# Patient Record
Sex: Male | Born: 1940 | Race: Black or African American | Hispanic: No | Smoking: Never smoker
Health system: Southern US, Community
[De-identification: ages and names within clinical notes are randomized; demographics above are authoritative.]

## PROBLEM LIST (undated history)

## (undated) DIAGNOSIS — B009 Herpesviral infection, unspecified: Secondary | ICD-10-CM

## (undated) DIAGNOSIS — E785 Hyperlipidemia, unspecified: Secondary | ICD-10-CM

## (undated) DIAGNOSIS — Z8719 Personal history of other diseases of the digestive system: Secondary | ICD-10-CM

## (undated) DIAGNOSIS — G8929 Other chronic pain: Secondary | ICD-10-CM

## (undated) DIAGNOSIS — Z860100 Personal history of colon polyps, unspecified: Secondary | ICD-10-CM

## (undated) DIAGNOSIS — M549 Dorsalgia, unspecified: Secondary | ICD-10-CM

## (undated) DIAGNOSIS — J189 Pneumonia, unspecified organism: Secondary | ICD-10-CM

## (undated) DIAGNOSIS — K219 Gastro-esophageal reflux disease without esophagitis: Secondary | ICD-10-CM

## (undated) DIAGNOSIS — I10 Essential (primary) hypertension: Secondary | ICD-10-CM

## (undated) DIAGNOSIS — Z8711 Personal history of peptic ulcer disease: Secondary | ICD-10-CM

## (undated) DIAGNOSIS — R350 Frequency of micturition: Secondary | ICD-10-CM

## (undated) DIAGNOSIS — E119 Type 2 diabetes mellitus without complications: Secondary | ICD-10-CM

## (undated) DIAGNOSIS — R3915 Urgency of urination: Secondary | ICD-10-CM

## (undated) DIAGNOSIS — M1990 Unspecified osteoarthritis, unspecified site: Secondary | ICD-10-CM

## (undated) DIAGNOSIS — G4730 Sleep apnea, unspecified: Secondary | ICD-10-CM

## (undated) HISTORY — PX: OTHER SURGICAL HISTORY: SHX169

## (undated) HISTORY — PX: PENILE PROSTHESIS IMPLANT: SHX240

## (undated) HISTORY — PX: ESOPHAGOGASTRODUODENOSCOPY (EGD) WITH ESOPHAGEAL DILATION: SHX5812

## (undated) HISTORY — PX: WRIST SURGERY: SHX841

## (undated) HISTORY — PX: ROTATOR CUFF REPAIR: SHX139

## (undated) HISTORY — PX: CERVICAL SPINE SURGERY: SHX589

## (undated) HISTORY — PX: NECK SURGERY: SHX720

## (undated) HISTORY — DX: Hyperlipidemia, unspecified: E78.5

## (undated) HISTORY — PX: COLONOSCOPY: SHX174

## (undated) HISTORY — DX: Essential (primary) hypertension: I10

## (undated) HISTORY — PX: SHOULDER SURGERY: SHX246

## (undated) HISTORY — PX: BACK SURGERY: SHX140

## (undated) HISTORY — PX: HAND SURGERY: SHX662

## (undated) HISTORY — DX: Herpesviral infection, unspecified: B00.9

---

## 1960-07-09 HISTORY — PX: BACK SURGERY: SHX140

## 2000-10-21 ENCOUNTER — Ambulatory Visit (HOSPITAL_COMMUNITY): Admission: RE | Admit: 2000-10-21 | Discharge: 2000-10-21 | Payer: Self-pay | Admitting: Internal Medicine

## 2000-10-21 ENCOUNTER — Encounter (INDEPENDENT_AMBULATORY_CARE_PROVIDER_SITE_OTHER): Payer: Self-pay | Admitting: Internal Medicine

## 2000-11-17 ENCOUNTER — Emergency Department (HOSPITAL_COMMUNITY): Admission: EM | Admit: 2000-11-17 | Discharge: 2000-11-18 | Payer: Self-pay | Admitting: *Deleted

## 2000-11-18 ENCOUNTER — Encounter: Payer: Self-pay | Admitting: *Deleted

## 2000-11-22 ENCOUNTER — Emergency Department (HOSPITAL_COMMUNITY): Admission: EM | Admit: 2000-11-22 | Discharge: 2000-11-22 | Payer: Self-pay | Admitting: *Deleted

## 2000-11-22 ENCOUNTER — Encounter: Payer: Self-pay | Admitting: *Deleted

## 2000-12-21 ENCOUNTER — Emergency Department (HOSPITAL_COMMUNITY): Admission: EM | Admit: 2000-12-21 | Discharge: 2000-12-21 | Payer: Self-pay | Admitting: *Deleted

## 2001-03-15 ENCOUNTER — Ambulatory Visit (HOSPITAL_COMMUNITY): Admission: RE | Admit: 2001-03-15 | Discharge: 2001-03-15 | Payer: Self-pay | Admitting: Family Medicine

## 2001-03-15 ENCOUNTER — Encounter: Payer: Self-pay | Admitting: Family Medicine

## 2001-05-21 ENCOUNTER — Emergency Department (HOSPITAL_COMMUNITY): Admission: EM | Admit: 2001-05-21 | Discharge: 2001-05-21 | Payer: Self-pay | Admitting: Emergency Medicine

## 2001-05-21 ENCOUNTER — Encounter: Payer: Self-pay | Admitting: Emergency Medicine

## 2001-10-14 ENCOUNTER — Observation Stay (HOSPITAL_COMMUNITY): Admission: RE | Admit: 2001-10-14 | Discharge: 2001-10-15 | Payer: Self-pay | Admitting: Urology

## 2001-12-05 ENCOUNTER — Emergency Department (HOSPITAL_COMMUNITY): Admission: EM | Admit: 2001-12-05 | Discharge: 2001-12-05 | Payer: Self-pay | Admitting: Emergency Medicine

## 2002-06-15 ENCOUNTER — Ambulatory Visit (HOSPITAL_COMMUNITY): Admission: RE | Admit: 2002-06-15 | Discharge: 2002-06-15 | Payer: Self-pay | Admitting: Internal Medicine

## 2002-06-15 ENCOUNTER — Encounter: Payer: Self-pay | Admitting: Internal Medicine

## 2003-01-10 ENCOUNTER — Emergency Department (HOSPITAL_COMMUNITY): Admission: EM | Admit: 2003-01-10 | Discharge: 2003-01-10 | Payer: Self-pay | Admitting: Emergency Medicine

## 2003-05-04 ENCOUNTER — Ambulatory Visit (HOSPITAL_COMMUNITY): Admission: RE | Admit: 2003-05-04 | Discharge: 2003-05-04 | Payer: Self-pay | Admitting: Internal Medicine

## 2004-01-13 ENCOUNTER — Emergency Department (HOSPITAL_COMMUNITY): Admission: EM | Admit: 2004-01-13 | Discharge: 2004-01-13 | Payer: Self-pay | Admitting: Emergency Medicine

## 2004-01-27 ENCOUNTER — Emergency Department (HOSPITAL_COMMUNITY): Admission: EM | Admit: 2004-01-27 | Discharge: 2004-01-27 | Payer: Self-pay | Admitting: Emergency Medicine

## 2004-06-12 ENCOUNTER — Emergency Department (HOSPITAL_COMMUNITY): Admission: EM | Admit: 2004-06-12 | Discharge: 2004-06-12 | Payer: Self-pay | Admitting: Emergency Medicine

## 2004-06-22 ENCOUNTER — Emergency Department (HOSPITAL_COMMUNITY): Admission: EM | Admit: 2004-06-22 | Discharge: 2004-06-22 | Payer: Self-pay | Admitting: *Deleted

## 2004-06-26 ENCOUNTER — Ambulatory Visit: Payer: Self-pay | Admitting: Orthopedic Surgery

## 2004-06-28 ENCOUNTER — Ambulatory Visit (HOSPITAL_COMMUNITY): Admission: RE | Admit: 2004-06-28 | Discharge: 2004-06-28 | Payer: Self-pay | Admitting: Family Medicine

## 2004-07-11 ENCOUNTER — Ambulatory Visit (HOSPITAL_COMMUNITY): Admission: RE | Admit: 2004-07-11 | Discharge: 2004-07-11 | Payer: Self-pay | Admitting: Orthopedic Surgery

## 2004-07-11 ENCOUNTER — Ambulatory Visit: Payer: Self-pay | Admitting: Orthopedic Surgery

## 2004-07-13 ENCOUNTER — Ambulatory Visit: Payer: Self-pay | Admitting: Orthopedic Surgery

## 2004-07-20 ENCOUNTER — Ambulatory Visit: Payer: Self-pay | Admitting: Orthopedic Surgery

## 2004-08-17 ENCOUNTER — Ambulatory Visit: Payer: Self-pay | Admitting: Orthopedic Surgery

## 2004-09-03 ENCOUNTER — Emergency Department (HOSPITAL_COMMUNITY): Admission: EM | Admit: 2004-09-03 | Discharge: 2004-09-03 | Payer: Self-pay | Admitting: Emergency Medicine

## 2004-09-28 ENCOUNTER — Ambulatory Visit: Payer: Self-pay | Admitting: Orthopedic Surgery

## 2004-12-11 ENCOUNTER — Ambulatory Visit: Payer: Self-pay | Admitting: Orthopedic Surgery

## 2004-12-31 ENCOUNTER — Emergency Department (HOSPITAL_COMMUNITY): Admission: EM | Admit: 2004-12-31 | Discharge: 2004-12-31 | Payer: Self-pay | Admitting: Emergency Medicine

## 2005-01-22 ENCOUNTER — Ambulatory Visit: Payer: Self-pay | Admitting: Urgent Care

## 2005-01-26 ENCOUNTER — Ambulatory Visit: Payer: Self-pay | Admitting: Internal Medicine

## 2005-01-26 ENCOUNTER — Ambulatory Visit (HOSPITAL_COMMUNITY): Admission: RE | Admit: 2005-01-26 | Discharge: 2005-01-26 | Payer: Self-pay | Admitting: Internal Medicine

## 2005-02-19 ENCOUNTER — Ambulatory Visit: Payer: Self-pay | Admitting: Orthopedic Surgery

## 2005-04-05 ENCOUNTER — Ambulatory Visit: Payer: Self-pay | Admitting: Orthopedic Surgery

## 2005-04-22 ENCOUNTER — Emergency Department (HOSPITAL_COMMUNITY): Admission: EM | Admit: 2005-04-22 | Discharge: 2005-04-22 | Payer: Self-pay | Admitting: Emergency Medicine

## 2005-05-14 ENCOUNTER — Emergency Department (HOSPITAL_COMMUNITY): Admission: EM | Admit: 2005-05-14 | Discharge: 2005-05-14 | Payer: Self-pay | Admitting: Emergency Medicine

## 2005-05-17 ENCOUNTER — Ambulatory Visit (HOSPITAL_COMMUNITY): Admission: RE | Admit: 2005-05-17 | Discharge: 2005-05-17 | Payer: Self-pay | Admitting: Family Medicine

## 2005-07-12 ENCOUNTER — Ambulatory Visit (HOSPITAL_BASED_OUTPATIENT_CLINIC_OR_DEPARTMENT_OTHER): Admission: RE | Admit: 2005-07-12 | Discharge: 2005-07-12 | Payer: Self-pay | Admitting: Orthopedic Surgery

## 2005-10-03 ENCOUNTER — Emergency Department (HOSPITAL_COMMUNITY): Admission: EM | Admit: 2005-10-03 | Discharge: 2005-10-03 | Payer: Self-pay | Admitting: Emergency Medicine

## 2006-02-19 ENCOUNTER — Ambulatory Visit: Payer: Self-pay | Admitting: Internal Medicine

## 2006-02-20 ENCOUNTER — Ambulatory Visit (HOSPITAL_COMMUNITY): Admission: RE | Admit: 2006-02-20 | Discharge: 2006-02-20 | Payer: Self-pay | Admitting: Internal Medicine

## 2006-03-07 ENCOUNTER — Ambulatory Visit (HOSPITAL_COMMUNITY): Admission: RE | Admit: 2006-03-07 | Discharge: 2006-03-07 | Payer: Self-pay | Admitting: Internal Medicine

## 2006-03-21 ENCOUNTER — Ambulatory Visit: Payer: Self-pay | Admitting: Internal Medicine

## 2006-04-11 ENCOUNTER — Encounter: Admission: RE | Admit: 2006-04-11 | Discharge: 2006-07-10 | Payer: Self-pay | Admitting: Internal Medicine

## 2006-06-12 ENCOUNTER — Ambulatory Visit: Payer: Self-pay | Admitting: Internal Medicine

## 2006-06-23 ENCOUNTER — Emergency Department (HOSPITAL_COMMUNITY): Admission: EM | Admit: 2006-06-23 | Discharge: 2006-06-23 | Payer: Self-pay | Admitting: Emergency Medicine

## 2006-07-30 ENCOUNTER — Emergency Department (HOSPITAL_COMMUNITY): Admission: EM | Admit: 2006-07-30 | Discharge: 2006-07-30 | Payer: Self-pay | Admitting: Emergency Medicine

## 2006-09-17 ENCOUNTER — Emergency Department (HOSPITAL_COMMUNITY): Admission: EM | Admit: 2006-09-17 | Discharge: 2006-09-17 | Payer: Self-pay | Admitting: Emergency Medicine

## 2006-10-08 ENCOUNTER — Emergency Department (HOSPITAL_COMMUNITY): Admission: EM | Admit: 2006-10-08 | Discharge: 2006-10-08 | Payer: Self-pay | Admitting: Emergency Medicine

## 2006-11-07 ENCOUNTER — Ambulatory Visit: Payer: Self-pay | Admitting: Internal Medicine

## 2007-01-22 ENCOUNTER — Emergency Department (HOSPITAL_COMMUNITY): Admission: EM | Admit: 2007-01-22 | Discharge: 2007-01-22 | Payer: Self-pay | Admitting: Emergency Medicine

## 2007-03-25 ENCOUNTER — Emergency Department (HOSPITAL_COMMUNITY): Admission: EM | Admit: 2007-03-25 | Discharge: 2007-03-25 | Payer: Self-pay | Admitting: Emergency Medicine

## 2007-07-15 ENCOUNTER — Emergency Department (HOSPITAL_COMMUNITY): Admission: EM | Admit: 2007-07-15 | Discharge: 2007-07-15 | Payer: Self-pay | Admitting: Emergency Medicine

## 2007-11-01 ENCOUNTER — Emergency Department (HOSPITAL_COMMUNITY): Admission: EM | Admit: 2007-11-01 | Discharge: 2007-11-01 | Payer: Self-pay | Admitting: Emergency Medicine

## 2008-04-07 ENCOUNTER — Emergency Department (HOSPITAL_COMMUNITY): Admission: EM | Admit: 2008-04-07 | Discharge: 2008-04-07 | Payer: Self-pay | Admitting: Emergency Medicine

## 2008-04-30 ENCOUNTER — Emergency Department (HOSPITAL_COMMUNITY): Admission: EM | Admit: 2008-04-30 | Discharge: 2008-04-30 | Payer: Self-pay | Admitting: Emergency Medicine

## 2008-05-08 ENCOUNTER — Emergency Department (HOSPITAL_COMMUNITY): Admission: EM | Admit: 2008-05-08 | Discharge: 2008-05-08 | Payer: Self-pay | Admitting: Emergency Medicine

## 2008-05-09 ENCOUNTER — Emergency Department (HOSPITAL_COMMUNITY): Admission: EM | Admit: 2008-05-09 | Discharge: 2008-05-09 | Payer: Self-pay | Admitting: Emergency Medicine

## 2008-05-14 ENCOUNTER — Emergency Department (HOSPITAL_COMMUNITY): Admission: EM | Admit: 2008-05-14 | Discharge: 2008-05-14 | Payer: Self-pay | Admitting: Emergency Medicine

## 2008-06-18 ENCOUNTER — Emergency Department (HOSPITAL_COMMUNITY): Admission: EM | Admit: 2008-06-18 | Discharge: 2008-06-18 | Payer: Self-pay | Admitting: Emergency Medicine

## 2008-10-10 ENCOUNTER — Emergency Department (HOSPITAL_COMMUNITY): Admission: EM | Admit: 2008-10-10 | Discharge: 2008-10-10 | Payer: Self-pay | Admitting: Emergency Medicine

## 2008-10-18 ENCOUNTER — Emergency Department (HOSPITAL_COMMUNITY): Admission: EM | Admit: 2008-10-18 | Discharge: 2008-10-18 | Payer: Self-pay | Admitting: Emergency Medicine

## 2008-10-24 ENCOUNTER — Emergency Department (HOSPITAL_COMMUNITY): Admission: EM | Admit: 2008-10-24 | Discharge: 2008-10-24 | Payer: Self-pay | Admitting: Emergency Medicine

## 2008-10-26 ENCOUNTER — Emergency Department (HOSPITAL_COMMUNITY): Admission: EM | Admit: 2008-10-26 | Discharge: 2008-10-26 | Payer: Self-pay | Admitting: Emergency Medicine

## 2008-11-21 ENCOUNTER — Emergency Department (HOSPITAL_COMMUNITY): Admission: EM | Admit: 2008-11-21 | Discharge: 2008-11-21 | Payer: Self-pay | Admitting: Emergency Medicine

## 2008-12-02 ENCOUNTER — Emergency Department (HOSPITAL_COMMUNITY): Admission: EM | Admit: 2008-12-02 | Discharge: 2008-12-02 | Payer: Self-pay | Admitting: Emergency Medicine

## 2009-01-27 ENCOUNTER — Emergency Department (HOSPITAL_COMMUNITY): Admission: EM | Admit: 2009-01-27 | Discharge: 2009-01-27 | Payer: Self-pay | Admitting: Emergency Medicine

## 2009-06-27 ENCOUNTER — Emergency Department (HOSPITAL_COMMUNITY): Admission: EM | Admit: 2009-06-27 | Discharge: 2009-06-27 | Payer: Self-pay | Admitting: Emergency Medicine

## 2009-07-01 ENCOUNTER — Emergency Department (HOSPITAL_COMMUNITY): Admission: EM | Admit: 2009-07-01 | Discharge: 2009-07-01 | Payer: Self-pay | Admitting: Emergency Medicine

## 2009-07-08 ENCOUNTER — Emergency Department (HOSPITAL_COMMUNITY): Admission: EM | Admit: 2009-07-08 | Discharge: 2009-07-08 | Payer: Self-pay | Admitting: Emergency Medicine

## 2009-10-05 ENCOUNTER — Encounter: Payer: Self-pay | Admitting: Cardiology

## 2009-11-23 ENCOUNTER — Emergency Department (HOSPITAL_COMMUNITY): Admission: EM | Admit: 2009-11-23 | Discharge: 2009-11-23 | Payer: Self-pay | Admitting: Emergency Medicine

## 2010-01-17 ENCOUNTER — Encounter: Payer: Self-pay | Admitting: Cardiology

## 2010-01-23 ENCOUNTER — Encounter: Payer: Self-pay | Admitting: Cardiology

## 2010-01-23 ENCOUNTER — Ambulatory Visit: Payer: Self-pay | Admitting: Cardiology

## 2010-02-13 ENCOUNTER — Ambulatory Visit: Payer: Self-pay | Admitting: Cardiology

## 2010-02-13 ENCOUNTER — Encounter (INDEPENDENT_AMBULATORY_CARE_PROVIDER_SITE_OTHER): Payer: Self-pay | Admitting: *Deleted

## 2010-02-13 DIAGNOSIS — R072 Precordial pain: Secondary | ICD-10-CM | POA: Insufficient documentation

## 2010-02-13 DIAGNOSIS — I10 Essential (primary) hypertension: Secondary | ICD-10-CM | POA: Insufficient documentation

## 2010-02-13 DIAGNOSIS — E119 Type 2 diabetes mellitus without complications: Secondary | ICD-10-CM

## 2010-02-15 ENCOUNTER — Encounter (INDEPENDENT_AMBULATORY_CARE_PROVIDER_SITE_OTHER): Payer: Self-pay | Admitting: *Deleted

## 2010-02-16 ENCOUNTER — Ambulatory Visit: Payer: Self-pay | Admitting: Cardiology

## 2010-02-16 ENCOUNTER — Inpatient Hospital Stay (HOSPITAL_BASED_OUTPATIENT_CLINIC_OR_DEPARTMENT_OTHER): Admission: RE | Admit: 2010-02-16 | Discharge: 2010-02-16 | Payer: Self-pay | Admitting: Cardiology

## 2010-02-16 HISTORY — PX: CARDIAC CATHETERIZATION: SHX172

## 2010-03-09 ENCOUNTER — Encounter: Payer: Self-pay | Admitting: Cardiology

## 2010-03-15 ENCOUNTER — Emergency Department (HOSPITAL_COMMUNITY): Admission: EM | Admit: 2010-03-15 | Discharge: 2010-03-16 | Payer: Self-pay | Admitting: Emergency Medicine

## 2010-03-28 ENCOUNTER — Ambulatory Visit: Payer: Self-pay | Admitting: Cardiology

## 2010-05-02 ENCOUNTER — Ambulatory Visit: Payer: Self-pay | Admitting: Internal Medicine

## 2010-07-15 ENCOUNTER — Emergency Department (HOSPITAL_COMMUNITY)
Admission: EM | Admit: 2010-07-15 | Discharge: 2010-07-15 | Payer: Self-pay | Source: Home / Self Care | Admitting: Emergency Medicine

## 2010-08-01 ENCOUNTER — Emergency Department (HOSPITAL_COMMUNITY)
Admission: EM | Admit: 2010-08-01 | Discharge: 2010-08-01 | Payer: Self-pay | Source: Home / Self Care | Admitting: Emergency Medicine

## 2010-08-01 ENCOUNTER — Ambulatory Visit
Admission: RE | Admit: 2010-08-01 | Discharge: 2010-08-01 | Payer: Self-pay | Source: Home / Self Care | Attending: Internal Medicine | Admitting: Internal Medicine

## 2010-08-02 LAB — GLUCOSE, CAPILLARY

## 2010-08-08 ENCOUNTER — Ambulatory Visit (HOSPITAL_COMMUNITY)
Admission: RE | Admit: 2010-08-08 | Discharge: 2010-08-08 | Payer: Self-pay | Source: Home / Self Care | Attending: Internal Medicine | Admitting: Internal Medicine

## 2010-08-08 ENCOUNTER — Ambulatory Visit: Admit: 2010-08-08 | Payer: Self-pay | Admitting: Internal Medicine

## 2010-08-08 LAB — GLUCOSE, CAPILLARY: Glucose-Capillary: 95 mg/dL (ref 70–99)

## 2010-08-08 NOTE — Assessment & Plan Note (Signed)
Summary: np-cp   Visit Type:  Follow-up Primary Provider:  Dr. Fara Chute  CC:  chest pain.  History of Present Illness: The patient presents for evaluation of chest discomfort. He said that this happened first about a month ago when he was walking quickly in the heat. He developed some severe substernal chest discomfort and had to stop what he was doing. He said it went away after several minutes. He did have a stress perfusion study which did not suggest any ischemia. His ejection fraction was preserved. However, since that time he has continued to have chest discomfort with exertion such as mowing the lawn. He has never had this discomfort before. It is not happening at rest. It is substernal and it is a gripping discomfort. He demonstrates the [place] sign.  He does not report radiation to his arms or jaw. He does not report associated nausea vomiting or diaphoresis. He has had no new shortness of breath, PND or orthopnea. He does report that his "energy" level is lower.  Preventive Screening-Counseling & Management  Alcohol-Tobacco     Smoking Status: never  Current Medications (verified): 1)  Glyburide-Metformin 5-500 Mg Tabs (Glyburide-Metformin) .... Take 2 Tab Two Times A Day 2)  Naprosyn 500 Mg Tabs (Naproxen) .... Take 1 Tablet By Mouth Two Times A Day As Needed 3)  Pantoprazole Sodium 40 Mg Tbec (Pantoprazole Sodium) .... Take 1 Tablet By Mouth Two Times A Day 4)  Ramipril 5 Mg Caps (Ramipril) .... Take 1 Tablet By Mouth Two Times A Day 5)  Metoprolol Succinate 50 Mg Xr24h-Tab (Metoprolol Succinate) .... Take 1 Tablet By Mouth Once A Day 6)  Omeprazole 40 Mg Cpdr (Omeprazole) .... Take 1 Tablet By Mouth Once A Day 7)  Aspirin 81 Mg Tbec (Aspirin) .... Take 1 Tablet By Mouth Once A Day  Allergies (verified): 1)  ! Jonne Ply  Past History:  Past Medical History: Diabetes since 1995 HTN since 1995 Hyperlipidemia since 1995l Herpes  Past Surgical History: Lumbar back  surgery Cervical spine surgery Rotator cuff (right) Left wrist surgery Right hand surgery Penile implant  Family History: Father Mi age 40 Brother MI age 59s Paternal uncle had MI at unknown age  Social History: Divorced Eight children Never smoked Retired from Therapist, art Status:  never  Review of Systems       As stated in the HPI and negative for all other systems.   Vital Signs:  Patient profile:   70 year old male Height:      70 inches Weight:      165 pounds BMI:     23.76 Pulse rate:   58 / minute BP sitting:   158 / 83  (left arm) Cuff size:   regular  Vitals Entered By: Hoover Brunette, LPN (February 13, 2010 2:09 PM) CC: chest pain Is Patient Diabetic? Yes Comments chest pain   Physical Exam  General:  Well developed, well nourished, in no acute distress. Head:  normocephalic and atraumatic Eyes:  PERRLA/EOM intact; conjunctiva and lids normal. Mouth:  Teeth, gums and palate normal. Oral mucosa normal. Neck:  Neck supple, no JVD. No masses, thyromegaly or abnormal cervical nodes. Chest Wall:  no deformities or breast masses noted Lungs:  Clear bilaterally to auscultation and percussion. Abdomen:  Bowel sounds positive; abdomen soft and non-tender without masses, organomegaly, or hernias noted. No hepatosplenomegaly. Msk:  Back normal, normal gait. Muscle strength and tone normal. Extremities:  No clubbing or cyanosis. Neurologic:  Alert  and oriented x 3. Skin:  Intact without lesions or rashes. Cervical Nodes:  no significant adenopathy Axillary Nodes:  no significant adenopathy Inguinal Nodes:  no significant adenopathy Psych:  Normal affect.   Detailed Cardiovascular Exam  Neck    Carotids: Carotids full and equal bilaterally without bruits.      Neck Veins: Normal, no JVD.    Heart    Inspection: no deformities or lifts noted.      Palpation: normal PMI with no thrills palpable.      Auscultation: regular rate and rhythm, S1,  S2 without murmurs, rubs, gallops, or clicks.    Vascular    Abdominal Aorta: no palpable masses, pulsations, or audible bruits.      Femoral Pulses: normal femoral pulses bilaterally.      Pedal Pulses: normal pedal pulses bilaterally.      Radial Pulses: normal radial pulses bilaterally.      Peripheral Circulation: no clubbing, cyanosis, or edema noted with normal capillary refill.     EKG  Procedure date:  01/23/2010  Findings:      Sus bradycardia, rate 57, axis within normal limits, intervals within normal limits, no acute ST-T wave changes  Impression & Recommendations:  Problem # 1:  PRECORDIAL PAIN (ICD-786.51) The patient's chest discomfort is very suggestive of unstable angina (exertional angina and new). He has significant cardiovascular risk factors. Therefore, despite the negative stress perfusion study cardiac catheterization is indicated as the posttest possibility of a false negative perfusion study is high. He understands cardiac catheterizations having been through before. He understands risks and benefits and agrees to proceed. Orders: T-Basic Metabolic Panel [id number]) T-CBC No Diff ([id number]) T-PTT ([id number]) T-Protime, Auto ([id number]) T-Chest x-ray, 2 views ([id number]) Cardiac Catheterization (Cardiac Cath)  Problem # 2:  ESSENTIAL HYPERTENSION, BENIGN (ICD-401.1) His blood pressure her resting is elevated and he had an accelerated blood pressure response with exercise. I will further address this with med titration.  Problem # 3:  DM (ICD-250.00) He understands the importance of diabetes control. I will defer to his primary physician.  Problem # 4:  Risk Reduction I will suggest a statin for primary prevention based on the results of the Heart Protection Study  Patient Instructions: 1)  Your physician has requested that you have a cardiac catheterization.  Cardiac catheterization is used to diagnose and/or treat various heart conditions. Doctors  may recommend this procedure for a number of different reasons. The most common reason is to evaluate chest pain. Chest pain can be a symptom of coronary artery disease (CAD), and cardiac catheterization can show whether plaque is narrowing or blocking your heart's arteries. This procedure is also used to evaluate the valves, as well as measure the blood flow and oxygen levels in different parts of your heart.  For further information please visit [email or web address]  Please follow instruction sheet, as given. 2)  Your physician recommends that you go to the [facility] for lab work and a chest x-ray. DO TODAY!

## 2010-08-08 NOTE — Letter (Signed)
Summary: Engineer, materials at [facility]  [address]   [place], [place] [address]   Phone: [phone]  Fax: [phone]        February 15, 2010 MRN: [id number]    [name] [address] [place], [place]  [address]    Dear [name],  Your test ordered by Selena Batten has been reviewed by your physician (or physician assistant) and was found to be normal or stable. Your physician (or physician assistant) felt no changes were needed at this time.  ____ Echocardiogram  ____ Cardiac Stress Test  __X__ Lab Work  ____ Peripheral vascular study of arms, legs or neck  _X___ CT scan or X-ray  ____ Lung or Breathing test  ____ Other:   Thank you.   Cyril Loosen, RN, BSN    Duane Boston, [name], F.A.C.C. Thressa Sheller, [name], F.A.C.C. Oneal Grout, [name], F.A.C.C. Cheree Ditto, [name], F.A.C.C. Daiva Nakayama, [name], F.A.C.C. Kenney Houseman, [name], F.A.C.C. Jeanne Ivan, PA-C

## 2010-08-08 NOTE — Letter (Signed)
Summary: Appointment -missed  [facility] [address]   [place], [place] [address]   Phone: [phone]  Fax: [phone]     March 09, 2010 MRN: [id number]     [name] [address] [place], [place]  [address]     Dear Mr. MELESKI,  Our records indicate you missed your appointment on March 09, 2010                        with Dr.  Antoine Poche.   It is very important that we reach you to reschedule this appointment. We look forward to participating in your health care needs.   Please contact us at the number listed above at your earliest convenience to reschedule this appointment.   Sincerely,    Glass blower/designer

## 2010-08-08 NOTE — Letter (Signed)
Summary: Cardiac Cath Instructions - [facility]  [name]   [place], [place] [address]   Phone: [phone]  Fax: [phone]     02/13/2010 MRN: [id number]  [name] [address] [place], [place]  [address]  Dear Mr. DINH, AYOTTE are scheduled for a Cardiac Catheterization on Thursday, February 16, 2010 with Dr. Antoine Poche.  Please arrive to the 1st floor of the Heart and Vascular Center at [facility] at 0830 am on the day of your procedure. Please do not arrive before 6:30 a.m. Call the Heart and Vascular Center at [phone] if you are unable to make your appointmnet. The Code to get into the parking garage under the building is 0002. Take the elevators to the 1st floor. You must have someone to drive you home. Someone must be with you for the first 24 hours after you arrive home. Please wear clothes that are easy to get on and off and wear slip-on shoes. Do not eat or drink after midnight except water with your medications that morning. Bring all your medications and current insurance cards with you.  _X__ DO NOT take these medications before your procedure: Hold Glyburide/Metformin for 24 hours before cath and 48 hrs after cath.  __X_ Make sure you take your aspirin. You may take 4 of your 81mg  Aspirin .  _X__ You may take all of your remaining medications with water that morning.  ___ DO NOT take ANY medications before your procedure.  ___ Pre-med instructions:  ________________________________________________________________________  The usual length of stay after your procedure is 2 to 3 hours. This can vary.  If you have any questions, please call the office at the number listed above.  Cyril Loosen, RN, BSN          Directions to the [facility] Center [facility]  Please Note : Park in [place] under the building not the parking deck.  From [employer]: Turn onto [address] Left onto [place] (1st stoplight) Right at the brick entrance to the hospital ([address] circle [address]) Bear to the right and you will see a blue sign "Heart and Vascular Center" Parking garage is a sharp right'to get through the gate out in the code _0002______. Once you park, take the elevator to the first floor. Please do not arrive before 0630am. The building will be dark before that time.   From [address] Turn onto [address] Turn left into the brick entrance to the hospital (Main circle drive) Bear to the right and you will see a blue sign "[facility] Center" Parking garage is a sharp right, to get thru the gate put in the code _0002___. Once you park, take the elevator to the first floor. Please do not arrive before 0630am. The building will be dark before that time

## 2010-08-08 NOTE — Assessment & Plan Note (Signed)
Summary: EPH/RM   Visit Type:  Follow-up Primary Provider:  [place]  CC:  chest pain.  History of Present Illness: The patient presents for followup of the catheterization. He had symptoms very suggestive of unstable angina. However cardiac catheterization demonstrated normal coronary arteries. He had no problems following the catheterization. He has had continued chest discomfort as described in the previous note. He now describes some burning in his lower abdomen as well. He says he had a bad taste in his mouth and thinks some of its related to drinking water and refill.  Preventive Screening-Counseling & Management  Alcohol-Tobacco     Smoking Status: never  Current Medications (verified): 1)  Glyburide-Metformin 5-500 Mg Tabs (Glyburide-Metformin) .... Take 2 Tab Two Times A Day 2)  Naprosyn 500 Mg Tabs (Naproxen) .... Take 1 Tablet By Mouth Two Times A Day As Needed 3)  Pantoprazole Sodium 40 Mg Tbec (Pantoprazole Sodium) .... Take 1 Tablet By Mouth Two Times A Day 4)  Ramipril 5 Mg Caps (Ramipril) .... Take 1 Tablet By Mouth Two Times A Day 5)  Metoprolol Succinate 50 Mg Xr24h-Tab (Metoprolol Succinate) .... Take 1 Tablet By Mouth Once A Day 6)  Omeprazole 40 Mg Cpdr (Omeprazole) .... Take 1 Tablet By Mouth Once A Day 7)  Aspirin 81 Mg Tbec (Aspirin) .... Take 1 Tablet By Mouth Once A Day  Allergies (verified): 1)  ! Asa  Comments:  Nurse/Medical Assistant: The patient is currently on medications but does not know the name or dosage at this time. Instructed to contact our office with details. Will update medication list at that time.  Past History:  Past Medical History: Reviewed history from 02/13/2010 and no changes required. Diabetes since 1995 HTN since 1995 Hyperlipidemia since 1995l Herpes  Past Surgical History: Reviewed history from 02/13/2010 and no changes required. Lumbar back surgery Cervical spine surgery Rotator cuff (right) Left wrist surgery Right  hand surgery Penile implant  Review of Systems       As stated in the HPI and negative for all other systems.   Vital Signs:  Patient profile:   70 year old male Height:      70 inches Weight:      167 pounds Pulse rate:   62 / minute BP sitting:   172 / 81  (left arm) Cuff size:   regular  Vitals Entered By: Carlye Grippe (March 28, 2010 3:24 PM)  Physical Exam  General:  Well developed, well nourished, in no acute distress. Head:  normocephalic and atraumatic Mouth:  Teeth, gums and palate normal. Oral mucosa normal. Neck:  Neck supple, no JVD. No masses, thyromegaly or abnormal cervical nodes. Chest Wall:  no deformities or breast masses noted Lungs:  Clear bilaterally to auscultation and percussion. Abdomen:  Bowel sounds positive; abdomen soft and non-tender without masses, organomegaly, or hernias noted. No hepatosplenomegaly. Msk:  Back normal, normal gait. Muscle strength and tone normal. Extremities:  Right groin with no hematoma ecchymosis, bruit midline pulsatile mass, erythema or exudate Neurologic:  Alert and oriented x 3. Skin:  Intact without lesions or rashes. Cervical Nodes:  no significant adenopathy Psych:  Normal affect.   Detailed Cardiovascular Exam  Neck    Carotids: Carotids full and equal bilaterally without bruits.      Neck Veins: Normal, no JVD.    Heart    Inspection: no deformities or lifts noted.      Palpation: normal PMI with no thrills palpable.      Auscultation:  regular rate and rhythm, S1, S2 without murmurs, rubs, gallops, or clicks.    Vascular    Abdominal Aorta: no palpable masses, pulsations, or audible bruits.      Femoral Pulses: normal femoral pulses bilaterally.      Pedal Pulses: normal pedal pulses bilaterally.      Radial Pulses: normal radial pulses bilaterally.      Peripheral Circulation: no clubbing, cyanosis, or edema noted with normal capillary refill.     Impression & Recommendations:  Problem # 1:   PRECORDIAL PAIN (ICD-786.51) The patient had normal coronaries. With his symptoms and his previous GI history I would suggest GI evaluation with possible endoscopy. He is due to see Dr. Leandrew Koyanagi tomorrow and I will defer to his management. No further cardiovascular testing is suggested.  Problem # 2:  ESSENTIAL HYPERTENSION, BENIGN (ICD-401.1) His blood pressure continues to be elevated. I have taken the liberty of starting Norvasc 2.5 mg daily. He is at a good dose of REM of Prelone probably wouldn't tolerate up titration of his beta blocker.  Problem # 3:  DM (ICD-250.00) Assessment: Unchanged Per Dr. Leandrew Koyanagi.  Patient Instructions: 1)  Start Norvasc (amlodipine) 2.5mg  by mouth once daily. Obtain further refills from primary MD. 2)  No further cardiac work up is needed. Follow up with your primary MD for further management of blood pressure. Prescriptions: NORVASC 2.5 MG TABS (AMLODIPINE BESYLATE) Take 1 tablet by mouth once a day.  #30 x 3   Entered by:   Cyril Loosen, RN, BSN   Authorized by:   Rollene Rotunda, MD, [facility]   Signed by:   Cyril Loosen, RN, BSN on 03/28/2010   Method used:   Electronically to        [facility] [address] (retail)       [address]       [place], [place]  [address]       Ph: [phone]       Fax: [phone]   RxID:   [id number]   Handout requested.  I have reviewed and approved all prescriptions at the time of this visit. Rollene Rotunda, MD, [facility]  March 28, 2010 3:51 PM

## 2010-08-08 NOTE — Letter (Signed)
Summary: External Correspondence/ OFFICE VISIT [facility]  External Correspondence/ OFFICE VISIT [facility]   Imported By: Dorise Hiss 01/19/2010 12:37:24  _____________________________________________________________________  External Attachment:    Type:   Image     Comment:   External Document

## 2010-08-12 NOTE — Consult Note (Addendum)
[name]            ACCOUNT NO.:  [id number]  MEDICAL RECORD NO.:  [id number]          PATIENT TYPE:  AMB  LOCATION:  DAY                           FACILITY:  [facility]  PHYSICIAN:  [name], [name]    DATE OF BIRTH:  Apr 30, 1941  DATE OF CONSULTATION:  08/01/2010 DATE OF DISCHARGE:                                CONSULTATION   PRESENTING COMPLAINT:  Burning retrosternal pain and odynophagia.  The patient also complains of painful swelling to his right thumb and some drainage.  HISTORY OF PRESENT ILLNESS:  [name] is a 70 year old African American male who is here for scheduled visit.  He was last seen in September 2009.  At that time, he did have some dysphagia, but he thought he was doing better.  He also had epigastric pain and recent ultrasound was negative for cholelithiasis, but did show a tiny polyp.  The patient had been on naproxen twice daily which I asked that he decrease or take it on p.r.n. basis.  He now returns with a complain of recurrent burning chest pain as well as odynophagia.  On July 15, 2010, he was seen in emergency room.  He points to mid sternal area site of bolus obstruction.  He has difficulty with solids.  He says it takes several minutes, but food eventually goes down.  He has not had any episode of regurgitation.  He denies abdominal pain, melena or rectal bleeding, but at times he does have a regurgitation of what he describes hot water into his mouth.  He states he is taking his PPI as recommended.  He did not bring his medications with him.  He says his appetite is great.  He has actually gained 5 pounds in the last 4 months.  He also complained of some pain at his both TMJ joints.  He also has a chronic low back pain.  About 2 weeks ago, he was working on his windows and had a splinter in his skin close to his right thumbnail.  He believes he cut it out, but since then he has had swelling and he has some drainage, but this has  stopped.  He states he did not go to see Dr. Leandrew Irwin because he did not have money. He states he is living in limited income and helping his son who is unemployed and daughter-in-law.  CURRENT MEDICATIONS: 1. Naprosyn 500 mg b.i.d. p.r.n. 2. Omeprazole 40 mg p.o. q.a.m. 3. Glucovance 5/500 two tablets b.i.d. 4. Ecotrin question dose daily. 5. Metoprolol 50 mg daily. 6. Amlodipine 2.5 mg daily. 7. Altace 5 mg b.i.d. 8. Simvastatin 20 mg daily.  PAST MEDICAL HISTORY:  He has hypertension, chronic GERD.  He has had his esophagus dilated in October 2004 and in July 2006 and possibly in 2009 obtained at [facility].  He has history of colonic polyps with multiple adenomas removed in the past.  His last colonoscopy was about 2 years ago at [facility].  He has diabetes mellitus.  He has chronic low back pain. 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  He also has had  surgery on his left forearm in 2005. He had surgery on his right shoulder for repair of rotator cuff tear in 2007 and he had decompression of the right carpal tunnel in 2008.  The patient states he had a cardiac cath within the last year and half and he was told no abnormality was found.  FAMILY HISTORY:  Negative for colorectal carcinoma, but father died of CAD at age 90 and mother had CHF and died at age 64.  SOCIAL HISTORY:  He is retired.  He is presently single.  He does not drink alcohol and he quit cigarette smoking over 20 years ago.  OBJECTIVE:  VITAL SIGNS:  Weight 173.7 pounds, he is 70 inches tall, pulse 74, blood pressure 140/80 and temp is 97.3. EYES:  Conjunctivae are pink.  Sclerae are nonicteric. MOUTH:  Oropharyngeal mucosa is normal.  Dentition in satisfactory condition.  He has had some filling. NECK:  No neck masses or thyromegaly noted. CARDIAC:  With regular rhythm.  Normal S1 and S2.  No murmur or gallop noted. LUNGS:  Clear to auscultation. ABDOMEN:  Symmetrical, soft  and nontender without organomegaly or masses. EXTREMITIES:  No peripheral edema or clubbing noted.  He does have periungual swelling involving the right thumb.  It is tender and warm. No open areas noted.  ASSESSMENT: 1. Odynophagia and recurrent burning retrosternal pain.  I wonder if     this is NSAID mediated injury.  He has had esophagus dilated few     times in the past.  He is on a PPI chronically.  I am afraid his     esophagus needs to be reevaluated and dilated if indicated.  He     could also have peptic ulcer disease. 2. He has acute paronychia of right thumb.  This appears to be due to     splinter injury.  If he still has a splinter, it will need to be     removed.  RECOMMENDATIONS: 1. The patient advised to back off on his Naprosyn as I told him the     last time.  He will continue his PPI. 2. Diagnostic EGD and possibly esophageal dilation, had [facility] in near     future. 3. We will start him on Keflex 500 mg p.o. q.6.  Prescription given     for 7 days.  Unless the patient feels better, he will need to follow with Dr. Leandrew Irwin and decision made whether he needs to have I and D, etc.     [name], [name]     NR/MEDQ  D:  08/01/2010  T:  08/02/2010  Job:  [id number]  cc:   Dr. Griselda Irwin, [facility]  Electronically Signed by [name] [name] on 08/12/2010 01:37:40 PM

## 2010-08-12 NOTE — Op Note (Signed)
  NAMEPHILBERT, [name]            ACCOUNT NO.:  [id number]  MEDICAL RECORD NO.:  [id number]          PATIENT TYPE:  AMB  LOCATION:  DAY                           FACILITY:  [facility]  PHYSICIAN:  Lionel December, [name]    DATE OF BIRTH:  1940/11/17  DATE OF PROCEDURE:  08/08/2010 DATE OF DISCHARGE:                              OPERATIVE REPORT   PROCEDURE:  Esophagogastroduodenoscopy with esophageal dilation.  INDICATIONS:  [name] is a 70 year old African American male who has chronic GERD maintained on antireflux measures and PPI who complains of retrosternal burning and/dysphagia and odynophagia.  He has been taking Naprosyn up to 1 g a day and has been advised in the past to take it only on an as-needed basis.  His esophagus has been dilated in the past.  Details of history are in my dictated H and P.  Procedure risks were reviewed with the patient.  Informed consent was obtained.  MEDICATIONS FOR CONSCIOUS SEDATION:  Cetacaine spray for pharyngeal topical anesthesia, Demerol 50 mg IV, Versed 4 mg IV.  FINDINGS:  Procedure performed in endoscopy suite.  The patient's vital signs and O2 saturations were monitored during the procedure and remained stable.  The patient was placed in left lateral recumbent position and Pentax videoscope was passed through oropharynx without any difficulty into esophagus.  Esophagus:  Mucosa of the esophagus was normal.  There was noncritical incomplete ring noted 4-cm proximal to the GE junction.  GE junction was located at 40 cm from the incisors and was unremarkable.  Multiple pictures were taken for the record.  Stomach:  It was empty and distended very well with insufflation.  Folds of proximal stomach were normal.  Examination of the mucosa of body, antrum, angularis, fundus, and cardia was normal.  Pylorus was somewhat deformed, but was patent.  Duodenum:  Bulbar mucosa was normal.  Scope was passed into second part of duodenum where mucosa  and folds were normal.  Endoscope was withdrawn.  Esophagus was dilated by passing 56-French Maloney dilator in full insertion.  As the dilator was withdrawn, endoscope was passed again. Esophageal mucosa reexamined and no disruption noted.  Endoscope was withdrawn.  The patient tolerated the procedure well.  FINAL DIAGNOSES: 1. No evidence of erosive or ulcerative esophagitis. 2. 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 3. Deformed, but patent pylorus. 4. Esophagus dilated by passing 56-French Maloney dilator where no     mucosal disruption noted.  RECOMMENDATIONS:  The patient will continue antireflux measures and omeprazole and take Naprosyn only on p.r.n. basis.  He will call us with the progress report in 1 week.  If his symptoms persist, we will consider esophageal manometry.     Lionel December, [name]     NR/MEDQ  D:  08/08/2010  T:  08/09/2010  Job:  [id number]  cc:   Quintin Alto, MD [place]  Electronically Signed by Lionel December [name] on 08/12/2010 01:37:53 PM

## 2010-09-22 LAB — POCT I-STAT GLUCOSE: Operator id: 133881

## 2010-09-25 LAB — GC/CHLAMYDIA PROBE AMP, GENITAL
Chlamydia, DNA Probe: NEGATIVE
GC Probe Amp, Genital: POSITIVE — AB

## 2010-10-09 LAB — URINALYSIS, ROUTINE W REFLEX MICROSCOPIC
Bilirubin Urine: NEGATIVE
Ketones, ur: NEGATIVE mg/dL
Protein, ur: NEGATIVE mg/dL
Urobilinogen, UA: 0.2 mg/dL (ref 0.0–1.0)

## 2010-10-15 LAB — BASIC METABOLIC PANEL
BUN: 11 mg/dL (ref 6–23)
CO2: 29 mEq/L (ref 19–32)
Chloride: 107 mEq/L (ref 96–112)
Creatinine, Ser: 0.92 mg/dL (ref 0.4–1.5)

## 2010-10-18 ENCOUNTER — Emergency Department (HOSPITAL_COMMUNITY): Payer: Medicare Other

## 2010-10-18 ENCOUNTER — Emergency Department (HOSPITAL_COMMUNITY)
Admission: EM | Admit: 2010-10-18 | Discharge: 2010-10-19 | Disposition: A | Discharge status: Home / Self Care | Payer: Medicare Other | Attending: Emergency Medicine | Admitting: Emergency Medicine

## 2010-10-18 DIAGNOSIS — J029 Acute pharyngitis, unspecified: Secondary | ICD-10-CM | POA: Insufficient documentation

## 2010-10-18 DIAGNOSIS — R51 Headache: Secondary | ICD-10-CM | POA: Insufficient documentation

## 2010-10-18 DIAGNOSIS — R059 Cough, unspecified: Secondary | ICD-10-CM | POA: Insufficient documentation

## 2010-10-18 DIAGNOSIS — I10 Essential (primary) hypertension: Secondary | ICD-10-CM | POA: Insufficient documentation

## 2010-10-18 DIAGNOSIS — K219 Gastro-esophageal reflux disease without esophagitis: Secondary | ICD-10-CM | POA: Insufficient documentation

## 2010-10-18 DIAGNOSIS — E785 Hyperlipidemia, unspecified: Secondary | ICD-10-CM | POA: Insufficient documentation

## 2010-10-18 DIAGNOSIS — R05 Cough: Secondary | ICD-10-CM | POA: Insufficient documentation

## 2010-10-18 DIAGNOSIS — B9789 Other viral agents as the cause of diseases classified elsewhere: Secondary | ICD-10-CM | POA: Insufficient documentation

## 2010-10-18 DIAGNOSIS — E119 Type 2 diabetes mellitus without complications: Secondary | ICD-10-CM | POA: Insufficient documentation

## 2010-10-18 LAB — URINALYSIS, ROUTINE W REFLEX MICROSCOPIC
Glucose, UA: >1000 mg/dL — AB
Glucose, UA: >1000 mg/dL — AB
Leukocytes, UA: NEGATIVE
Leukocytes, UA: NEGATIVE
Nitrite: NEGATIVE
Specific Gravity, Urine: <1.005 — ABNORMAL LOW (ref 1.005–1.030)
Urobilinogen, UA: 0.2 mg/dL (ref 0.0–1.0)
pH: 6 (ref 5.0–8.0)

## 2010-10-18 LAB — URINE MICROSCOPIC-ADD ON

## 2010-10-18 LAB — BLOOD GAS, ARTERIAL
Bicarbonate: 24.3 mEq/L — ABNORMAL HIGH (ref 20.0–24.0)
FIO2: 0.21 %
TCO2: 22.1 mmol/L (ref 0–100)
pCO2 arterial: 44.4 mmHg (ref 35.0–45.0)
pH, Arterial: 7.357 (ref 7.350–7.450)
pO2, Arterial: 85.4 mmHg (ref 80.0–100.0)

## 2010-10-18 LAB — POCT CARDIAC MARKERS
CKMB, poc: <1 ng/mL — ABNORMAL LOW (ref 1.0–8.0)
Myoglobin, poc: 104 ng/mL (ref 12–200)

## 2010-10-18 LAB — DIFFERENTIAL
Eosinophils Absolute: 0 10*3/uL (ref 0.0–0.7)
Lymphocytes Relative: 42 % (ref 12–46)
Lymphs Abs: 1.2 10*3/uL (ref 0.7–4.0)
Monocytes Relative: 7 % (ref 3–12)
Neutrophils Relative %: 50 % (ref 43–77)

## 2010-10-18 LAB — GLUCOSE, CAPILLARY
Glucose-Capillary: 197 mg/dL — ABNORMAL HIGH (ref 70–99)
Glucose-Capillary: 382 mg/dL — ABNORMAL HIGH (ref 70–99)

## 2010-10-18 LAB — CBC
MCV: 93.3 fL (ref 78.0–100.0)
RBC: 3.85 MIL/uL — ABNORMAL LOW (ref 4.22–5.81)
WBC: 2.9 10*3/uL — ABNORMAL LOW (ref 4.0–10.5)

## 2010-10-18 LAB — BASIC METABOLIC PANEL
BUN: 13 mg/dL (ref 6–23)
Chloride: 98 mEq/L (ref 96–112)
Chloride: 98 mEq/L (ref 96–112)
Creatinine, Ser: 1.02 mg/dL (ref 0.4–1.5)
GFR calc Af Amer: >60 mL/min (ref >60)
GFR calc non Af Amer: >60 mL/min (ref >60)
GFR calc non Af Amer: >60 mL/min (ref >60)
Glucose, Bld: 329 mg/dL — ABNORMAL HIGH (ref 70–99)
Potassium: 4.3 mEq/L (ref 3.5–5.1)
Potassium: 4.4 mEq/L (ref 3.5–5.1)
Sodium: 136 mEq/L (ref 135–145)

## 2010-10-18 LAB — RPR: RPR Ser Ql: NONREACTIVE

## 2010-10-18 LAB — GC/CHLAMYDIA PROBE AMP, GENITAL: Chlamydia, DNA Probe: NEGATIVE

## 2010-10-18 LAB — KETONES, QUALITATIVE

## 2010-11-16 ENCOUNTER — Other Ambulatory Visit: Payer: Self-pay | Admitting: Cardiology

## 2010-11-22 ENCOUNTER — Emergency Department (HOSPITAL_COMMUNITY)
Admission: EM | Admit: 2010-11-22 | Discharge: 2010-11-22 | Disposition: A | Discharge status: Home / Self Care | Payer: Medicare Other | Attending: Emergency Medicine | Admitting: Emergency Medicine

## 2010-11-22 DIAGNOSIS — I10 Essential (primary) hypertension: Secondary | ICD-10-CM | POA: Insufficient documentation

## 2010-11-22 DIAGNOSIS — E785 Hyperlipidemia, unspecified: Secondary | ICD-10-CM | POA: Insufficient documentation

## 2010-11-22 DIAGNOSIS — R51 Headache: Secondary | ICD-10-CM | POA: Insufficient documentation

## 2010-11-22 DIAGNOSIS — Z8711 Personal history of peptic ulcer disease: Secondary | ICD-10-CM | POA: Insufficient documentation

## 2010-11-22 DIAGNOSIS — Z7982 Long term (current) use of aspirin: Secondary | ICD-10-CM | POA: Insufficient documentation

## 2010-11-22 DIAGNOSIS — K219 Gastro-esophageal reflux disease without esophagitis: Secondary | ICD-10-CM | POA: Insufficient documentation

## 2010-11-22 DIAGNOSIS — Z79899 Other long term (current) drug therapy: Secondary | ICD-10-CM | POA: Insufficient documentation

## 2010-11-22 DIAGNOSIS — K089 Disorder of teeth and supporting structures, unspecified: Secondary | ICD-10-CM | POA: Insufficient documentation

## 2010-11-22 LAB — GLUCOSE, CAPILLARY: Glucose-Capillary: 96 mg/dL (ref 70–99)
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
## 2010-11-24 NOTE — H&P (Signed)
NAME:  [name], [name]                        ACCOUNT NO.:  [id number]   MEDICAL RECORD NO.:  [id number]                  PATIENT TYPE:   LOCATION:                                       FACILITY:   PHYSICIAN:  Lionel December, [name]                 DATE OF BIRTH:   DATE OF ADMISSION:  DATE OF DISCHARGE:                                HISTORY & PHYSICAL   CHIEF COMPLAINT:  Problem swallowing, stomach hurting.   HISTORY OF PRESENT ILLNESS:  This patient is here for a follow up visit.  He  was last seen in October 2003 for history of gastroesophageal reflux  disease, diabetic gastroparesis, and dysphagia-type symptoms.  The symptoms  had improved with changing his PPI to Aciphex.  He also has a history of  colonic polyp with carcinoma in situ and was due to for a colonoscopy in  September 2004.   The patient presents today stating that he ran out of his Prilosec several  weeks ago for about a week.  He was doing fine until that time, but then  developed heartburn symptoms, a sour taste in his mouth, pain in his left  throat area with swallowing and dysphagia to solid foods.  He continued to  have some of these symptoms, although they are much milder since resuming  his Prilosec.  He is on Prilosec 20 mg b.i.d. He also has occasional  epigastric burning. His bowels move anywhere from 2 times daily to every  other day.  He denies any melena or rectal bleeding.   CURRENT MEDICATIONS:  1. Prilosec 20 mg b.i.d.  2. Glucovance 2 q.a.m. 1 q.p.m.  3. Toprol 50 mg daily.  4. Altace 5 mg b.i.d.  5. Aspirin 325 mg daily.  6. Zocor 20 mg daily.   ALLERGIES:  ASPIRIN.   PAST MEDICAL HISTORY:  1. Type 2 diabetes mellitus.  2. History of peptic ulcer disease first diagnosed in 1985 when he was found     to have a 1 cm ulcer on the anterior wall of the duodenum.  In 1987 he     had a prepyloric 1 cm ulcer during an EGD.  In 1995 EGD was done and     revealed H. pylori gastritis, pyloric  channel stenosis and inflammation.     CLOtest was positive and he was treated with tetracycline, metronidazole     and Pepto-Bismol for 10 days.  3. Colonoscopy in 1999 due to an episode of GI bleeding, revealed 3 polyps     varying in size from 5 mm to 7 mm.  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  It was the smallest of the 3 polyps in the     sigmoid colon that contain carcinoma in situ.  He had serial CEAs. He had     follow up flexible sigmoidoscopy in 1999 which revealed no evidence of  local, recurrent polyps in the sigmoid colon.  Most recent colonoscopy     was September 2001 performed to the cecum and revealed tiny polyps     ablated by cold biopsy from the area of the hepatic flexure and external     hemorrhoids.  Polyps were tubular villous adenomas and tubular adenomas.     The last EGD with dilatation was February 2002 at which time he had a     distal esophageal ring just proximal to the GE junction which was dilated     with focal biopsy.  He had a deformed, but patent pyloric channel.  4. He also has hyperlipidemia.  5. Hypertension.  6. He tells me that he had a cardiac catheterization years ago which was     normal and a treadmill stress test 3 months ago which was unremarkable.   FAMILY HISTORY:  Mother had CHF and diabetes mellitus.  He had 2 brother who  had some type of cancer and died after stomach surgery.  No family history  of colorectal cancer to his knowledge.   SOCIAL HISTORY:  He is married.  He has 8 children.  He is on disability  because of back problems.  He does not use tobacco or alcohol.   REVIEW OF SYSTEMS:  Please see HPI for GI. GENERAL:  Denies any weight loss.  CARDIOPULMONARY:  Denies any chest pain or shortness of breath.   PHYSICAL EXAMINATION:  VITAL SIGNS:  Weight 192.5. Blood pressure 140/100,  pulse 66.  GENERAL:  A pleasant, well-nourished, well-developed, black male in no acute  distress.  SKIN:  Warm and dry.   No jaundice.  HEENT:  Conjunctivae are pink. Sclerae anicteric.  Oropharyngeal mucosa  moist and pink.  No lesions, erythema or exudate. No lymphadenopathy or  thyromegaly.  CHEST:  Lungs are clear to auscultation.  CARDIOVASCULAR:  Cardiac exam reveals regular rate and rhythm.  Normal S1,  S2.  No murmurs, rubs, or gallops.  ABDOMEN:  Positive bowel sounds, soft, nondistended, mild epigastric  tenderness to deep palpation. No organomegaly or masses.  EXTREMITIES:  No edema.   IMPRESSION:  1. This patient is a pleasant, 70 year old gentleman with recent recurrence     of typical reflux symptoms as well as dysphagia when he ran out of his     Prilosec medication.  Symptoms have started to improve since resuming the     Prilosec.  He continues to have solid food dysphagia.  Recommend an upper     endoscopy at this time for possible dilatation.  2. 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  3. Epigastric pain on examination, as well as complaints of epigastric     burning; will be examined at the time of upper endoscopy.  4. History of colonic polyp with carcinoma in situ.  He was due to     surveillance colonoscopy September 2004.    PLAN:  1. EGD and colonoscopy in the near future.  2. Continue Prilosec 20 mg b.i.d. for now.  3. Further recommendations to follow.     _____________________________________  ___________________________________________  [name], P.A.                      Lionel December, [name]   LL/MEDQ  D:  04/14/2003  T:  04/14/2003  Job:  [id number]   cc:   Kirk Ruths, [name]  [address]  [place]  [place] [address]  Fax: [phone]   Lionel December, [name]  [address]  [place]  [place] [address]  Fax: [phone]

## 2010-11-24 NOTE — Op Note (Signed)
NAME:  [name], [name]                      ACCOUNT NO.:  [id number]   MEDICAL RECORD NO.:  [id number]                   PATIENT TYPE:  AMB   LOCATION:  DAY                                  FACILITY:  [facility]   PHYSICIAN:  Lionel December, [name]                 DATE OF BIRTH:  1941-05-17   DATE OF PROCEDURE:  05/04/2003  DATE OF DISCHARGE:                                 OPERATIVE REPORT   PROCEDURE:  Esophagogastroduodenoscopy with esophageal dilatation followed  by surveillance colonoscopy.   INDICATIONS FOR PROCEDURE:  [name] is a 70 year old African American male  with chronic GERD who is maintained on PPI who presents with intermittent  solid food dysphagia.  He has history of a distal esophageal ring and was  last dilated in February, 2002.  He also has history of gastroparesis but  apparently doing well without a motility agent.  He has history of colonic  polyps.  He had three polyps removed in 1999, and one was an adenoma with  intramucosal carcinoma.  His last colonoscopy was over three years ago.  He  is undergoing a surveillance exam.  The procedure risks were reviewed with  the patient, and informed consent was obtained.   PREOPERATIVE MEDICATIONS:  Cetacaine spray for pharyngeal topical  anesthesia, Demerol 50 mg IV, Versed 5 mg IV in divided doses.   FINDINGS:  The procedure was performed in the endoscopy suite.  The  patient's vital signs and O2 saturations were monitored during the procedure  and remained stable.   PROCEDURE #1, ESOPHAGOGASTRODUODENOSCOPY:  The patient was placed in the  left lateral recumbent position, and the Olympus videoscope was passed via  the oropharynx without any difficulty into the esophagus.   Esophagus:  The mucosa of the esophagus was normal throughout.  The  squamocolumnar junction was wavy.  There was a small sliding hiatal hernia.  No ring or stricture was noted.   Stomach:  It was empty and distended very well with  insufflation.  The folds  of the proximal stomach were normal.  Examination of  the mucosa revealed  antral granularity, but no erosions or ulcers were noted.  Please note that  he has history of peptic ulcer disease and previously has been treated for H  pylori infection.  The pyloric channel was patent.  The angularis, fundus,  and cardia were examined by retroflexing the scope and were normal.   Duodenum:  Examination of the bulb revealed normal mucosa.  The endoscope  was passed to the second part of the duodenum where the mucosa and folds  were normal.  The endoscope was withdrawn.   The esophagus was dilated by passing a 56 [place] Maloney dilator to full  insertion.  As the dilator was withdrawn, the endoscope was passed again,  and no mucosal injury was noted to the esophagus.  The scope was withdrawn,  and the patient was prepared for procedure #  2.   PROCEDURE #2, COLONOSCOPY:  Rectal was examination performed.  No  abnormality noted on external or digital exam.  The scope was placed into  the rectum and advanced into the region of the sigmoid colon.  The  preparation was excellent.  The scope was passed to the cecum which was  identified by the appendiceal orifice and ileocecal valve.  As the scope was  withdrawn, the colonic mucosa was carefully examined, and there were no  polyps and/or tumor masses.  The rectal mucosa similarly was normal.  The  scope was retroflexed to examine the anorectal junction, and small  hemorrhoids were noted above and below the dentate line.  The endoscope was  straightened and withdrawn.  The patient tolerated the procedure well.   FINAL DIAGNOSES:  1. Small sliding hiatal hernia with wavy gastroesophageal junction, but no     ring or stricture were noted.  2. Esophagus dilated by passing a 56 French Maloney dilator, given history     of dysphagia.  3. Nonerosive antral gastritis.  No evidence of peptic ulcer disease.  4. Normal colonoscopy,  except internal and external hemorrhoids.   RECOMMENDATIONS:  1. He will continue his antireflux measures and PPI.  2. Will call the office if he still has dysphagia.  3. As far as followup colonoscopy is concerned, he will need to return in     five years from now.      ___________________________________________                                            Lionel December, [name]   NR/MEDQ  D:  05/04/2003  T:  05/04/2003  Job:  [id number]   cc:   Kirk Ruths, [name]  [address]  [place]  [place] [address]  Fax: [phone]

## 2010-11-24 NOTE — Op Note (Signed)
NAMELORRIS, CARDUCCI            ACCOUNT NO.:  [id number]   MEDICAL RECORD NO.:  [id number]          PATIENT TYPE:  AMB   LOCATION:  DAY                           FACILITY:  [facility]   PHYSICIAN:  Lionel December, [name]    DATE OF BIRTH:  1940/07/28   DATE OF PROCEDURE:  01/26/2005  DATE OF DISCHARGE:                                 OPERATIVE REPORT   PROCEDURE:  Esophagogastroduodenoscopy with esophageal dilation.   ENDOSCOPIST:  Lionel December, [name]   INDICATIONS:  [name] is 70 year old African-American male with history of  GERD who presents with solid food dysphagia. He has a history of an  esophageal ring and has undergone dilation twice in the past, most was  recently in October, 2004. The procedure and risks  were reviewed the  patient, and informed consent was obtained.   PREOPERATIVE MEDICATION:  Cetacaine spray pharyngeal topical anesthesia,  Demerol 50 mg IV Versed 5 mg IV in divided dose.   FINDINGS:  The procedure was performed in the endoscopy suite. The patient's  vital signs and O2 saturations were monitored during the procedure and  remained stable. The patient was placed in the left lateral decubitus  position, and the Olympus videoscope was passed through the  oropharynx  without any difficulty into the esophagus.   Esophagus:  Mucosa of the esophagus normal. He had an incomplete ring about  4 cm proximal to the GE junction. The GE junction was at 40 cm. He has a  sliding hiatal hernia but this is no apparent today as it was spontaneously  reduced. No Schatzki's ring was noted.   Stomach:  It was empty and distended very well with insufflation. Folds of  the proximal stomach are normal. Examination of the mucosa revealed antral  erythema, but no erosions or ulcers are noted. Pyloric channel was patent.  Angularis, fundus, and cardia were examined by retroflexion of the scope and  were normal.   Duodenum:  Bulbar mucosa was normal. The scope was passed to the  second part  of the duodenum. The mucosa and folds are normal. The endoscope was  withdrawn.   Esophagus was dilated by passing a 56-French Maloney dilator to full  insertion where the ring was intact and, therefore. disrupted with focal  biopsy, but no tissue was saved.  The endoscope was withdrawn. The patient  tolerated the procedure well.   FINAL DIAGNOSIS:  1.  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  2.  Antral erythema, possibly related to ibuprofen.  Please note that he has      been treated for Helicobacter pylori gastritis back in April, 2002.   RECOMMENDATIONS:  He will continue antireflux measures and cigarettes as  before. He will call with progress report next week. If he remains with  dysphagia, we will proceed with barium pill study prior to esophageal  manometry.       NR/MEDQ  D:  01/26/2005  T:  01/26/2005  Job:  [id number]  cc:   Kirk Ruths, [name]  [address]  [place]  [place] [address]  Fax: [phone]

## 2010-11-24 NOTE — H&P (Signed)
[facility]  Patient:    [name], [name] Visit Number: [id number] MRN: [id number]          Service Type: Attending:  Alleen Borne, [name] Dictated by:   Alleen Borne, [name]                           History and Physical  CHIEF COMPLAINT:  Erectile impotence.  HISTORY OF PRESENT ILLNESS:  This gentleman has been followed for the last 15 years complaining of erectile dysfunction.  He has been treated with different medications including penile injection with some good response, but lately he says he is not voiding and he wants something done about it.  I have discussed use of erector which he is tried and does not like.  He wants me to go ahead and do the implant surgery.  I discussed with him various kinds of implants, and he elected to go with inflatable penile prosthesis.  I have discussed its complications especially because he is diabetic.  I told him there is a high chance of infection.  It that does happen, we will have to remove the implant. He understands and wants me to proceed.  I had this meeting with the patient and his wife, and they both want me to proceed with it.  PAST MEDICAL HISTORY:  Non-insulin-dependent diabetes mellitus and hypertension.  MEDICATIONS:  GlucoVance, Toprol, Prilosec, Altace, Ecotrin, and metoclopramide.  ALLERGIES:  None.  SOCIAL HISTORY:  He does not smoke or drink.  REVIEW OF SYSTEMS:  Unremarkable.  PHYSICAL EXAMINATION:  VITAL SIGNS:  Blood pressure 130/80, temperature 98.  NEUROLOGIC:  No gross neurological deficit.  NECK:  Negative.  CHEST:  Symmetrical.  HEART:  Regular sinus rhythm.  ABDOMEN:  Soft, flat.  Liver, spleen, kidneys not palpable.  GU:  Circumcised.  Meatus retrograde testicles normal.  RECTAL:  Normal sphincter tone.  No rectal mass.  Prostate smooth and firm.  IMPRESSION: 1. Erectile dysfunction. 2. Non-insulin-dependent diabetes mellitus. 3. Hypertension.  PLAN:   Inflatable penile prosthesis under anesthesia, then keep him in the hospital for observation. Dictated by:   Alleen Borne, [name] Attending:  Alleen Borne, [name] DD:  10/13/01 TD:  10/13/01 Job: [id number]

## 2010-11-24 NOTE — Op Note (Signed)
[facility]  Patient:    [name], [name] Visit Number: [id number] MRN: [id number]          Service Type: Attending:  Alleen Borne, [name] Dictated by:   Alleen Borne, [name] Proc. Date: 10/14/01                             Operative Report  PREOPERATIVE DIAGNOSES:  Erectile dysfunction, non-insulin dependent diabetes.  POSTOPERATIVE DIAGNOSES:  Erectile dysfunction, non-insulin dependent diabetes.  PROCEDURE:  Insertion of inflatable penile prosthesis, Ambicor type, size 20 cm x 13 mm with a  3 cm extender.  ANESTHESIA:  General endotracheal.  SURGEON:  Alleen Borne, [name]  COMPLICATIONS:  None.  DESCRIPTION OF PROCEDURE:  The patient was given general endotracheal anesthesia, prepped and draped. A #16 Foley catheter was placed into the bladder and the penoscrotal incision is made on both sides of the urethra, corpora cavernosa were exposed. Then on both sides, two holding stitches were placed using 2-0 Vicryl in the corporal bodies and in between these stitches a corporotomy is made first on the right side. Through this corporotomy, Hegar dilators are introduced and the corporal bodies dilated up to 14 Hegar without any problem. When both cavities were dilated, the size of the prosthesis was measured with the furlow sizer and it was 23 cm length, so we elected to insert a 20 cm x 13 mm with 3 cm extender. Periodically the wound was irrigated with antibiotic solution. The prosthesis was then prepared and inflated. The proximal ends of the prosthesis were inserted into the corporal body. Now one by one after deflating the prosthesis, the distal end of the prosthesis was inserted. First a Mellody Dance needle was passed through the glans penis pulling on the stitch and when both prosthesis were in place, both strings were pulled together to make sure the prosthesis fits into the corporal body properly. There are no wrinkles. The prosthesis is  inflated and when checked it is straight and there are no wrinkles. At this point, I put a suture to close the corporotomy using interrupted sutures of #0 Vicryl very careful not to hit the prosthesis with the needle. All the stitches were tied nicely.  The wound was irrigated with antibiotic solution. The glans penis stitches were removed. The prosthesis inflates and deflates nicely. I proceeded to close the pump into the right hemiscrotum. The scrotal pouch was developed in the usual fashion. The pump is placed and the fascial layers were closed with a continuous stitch of 3-0 Vicryl. The sleeve covering the tubing was removed from both tubes and now the skin was closed with interrupted sutures of 4-0 chromic. The wound was also closed with clonidine solution. The prosthesis was once again inflated and deflated and left deflated. A fluff dressing was applied to the scrotum. The patient left the operating room in satisfactory condition. Dictated by:   Alleen Borne, [name] Attending:  Alleen Borne, [name] DD:  10/14/01 TD:  10/14/01 Job: [id number]

## 2010-11-24 NOTE — Op Note (Signed)
[name], [name]            ACCOUNT NO.:  [id number]   MEDICAL RECORD NO.:  [id number]          PATIENT TYPE:  AMB   LOCATION:  DAY                           FACILITY:  [facility]   PHYSICIAN:  Vickki Hearing, M.D.DATE OF BIRTH:  22-Jul-1940   DATE OF PROCEDURE:  DATE OF DISCHARGE:                                 OPERATIVE REPORT   PREOPERATIVE DIAGNOSIS:  Left radial shaft fracture/Galeazzi fracture  equivalent.   POSTOPERATIVE DIAGNOSIS:  Left radial shaft fracture/Galeazzi fracture  equivalent.   PROCEDURE:  Open treatment and internal fixation, left distal radius.   SURGEON:  Vickki Hearing, [name]   ANESTHESIA:  General.   FINDINGS:  Displaced, angulated left distal radius fracture, so-called  Galeazzi equivalent.   INDICATIONS FOR PROCEDURE:  Displacement of a radius shaft fracture in an  adult.   DESCRIPTION OF PROCEDURE:  This is a 70 year old male who was identified as  [name] in the preoperative holding area.  The patient and  surgeon sequentially signed the operative site as the left distal radius and  radial shaft.  He was given preoperative Ancef and taken to the operating  room for a general anesthetic.  Once this was successful, his left upper  extremity was prepped and draped using sterile technique.  A timeout was  taken as required, and the surgical site was confirmed along with the  patient's identity.  All implants were in the room, antibiotics were  confirmed to be given.   The tourniquet was elevated after exsanguination of the limb to 250 mmHg  where it stayed for 42 minutes.  The volar approach of Sherilyn Cooter was used to  expose the fracture, protecting the FCR and the radial artery.  Subperiosteal dissection exposed the fracture.  The fracture was reduced  with clamps and held with bone reduction clamps.  An eight-hole plate was  applied using AO technique and eight screws with one lag screw across the  fracture site.   The wound  was irrigated.  Motion was checked.  He had full range of motion  in pronation, supination, flexion, and extension.  We closed with 2-0  Monocryl and staples.  We injected 30 cc of Marcaine.  We applied a volar  splint.  The tourniquet was released.   The patient was extubated.  He was taken to the recovery room in stable  condition.   POSTOPERATIVE PLAN:  Splint for two days and then to physical therapy for a  removable splint and active range of motion of the hand.     Weyman Croon   SEH/MEDQ  D:  07/11/2004  T:  07/11/2004  Job:  [id number]

## 2010-11-24 NOTE — Op Note (Signed)
NAMEMELDON, HANZLIK            ACCOUNT NO.:  [id number]   MEDICAL RECORD NO.:  [id number]          PATIENT TYPE:  AMB   LOCATION:  DSC                          FACILITY:  [facility]   PHYSICIAN:  Loreta Ave, [name] 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 Anterior labrum tear.   PROCEDURE:  Right shoulder exam under anesthesia, arthroscopy, debridement  of rotator cuff and labrum, bursectomy, acromioplasty, CA ligament release,  excision of distal clavicle.   SURGEON:  Loreta Ave, [name]   ASSISTANT:  Genene Churn. Denton Meek.   ANESTHESIA:  General.   BLOOD LOSS:  Minimal.   SPECIMENS:  None.   CULTURES:  None.   COMPLICATIONS:  None.   DRESSINGS:  Soft compressive with sling.   PROCEDURE:  The patient was brought to the operating room and after adequate  anesthesia had been obtained the right shoulder was examined. Full motion  and good stability. Placed in beach chair position and shoulder positioned  and prepped and draped in the usual sterile fashion. Three standard portals;  anterior, posterior, and lateral. Shoulder entered with blunt obturator.  Arthroscope introduced. Shoulder distended and inspected. Anterior labral  tearing from about 1 to 3 o'clock debrided. No instability pattern. Capsular  ligamentous structures intact. Articular cartilage was good. No SLAP lesion.  Undersurface of rotator cuff normal. Cannula redirected subacromially.  Marked reactive bursitis almost looking like rice bodies. All of this  debrided. Type 3 acromion. A lot of abrasion __________  but  no full  thickness tears. Acromioplasty to a type 1 acromion. Grade 4 changes with  marked spurring of AC joint. Periarticular spurs and lateral __________  of  clavicle resected. At completion adequacy of decompression clavicle  excision, cuff debridement, and firm view from all portals. Instruments  __________  removed. Portal, shoulder, and bursa injected with Marcaine.  Portal was closed with 4-0 nylon. Sterile compressive dressing applied.  Sling applied. Anesthesia reversed. Brought to recovery room. Tolerated  surgery well. No complications.      Loreta Ave, [name]  Electronically Signed     DFM/MEDQ  D:  07/12/2005  T:  07/12/2005  Job:  [id number]

## 2011-01-11 ENCOUNTER — Encounter: Payer: Self-pay | Admitting: Cardiology

## 2011-01-17 ENCOUNTER — Encounter (HOSPITAL_COMMUNITY): Payer: Self-pay | Admitting: *Deleted

## 2011-01-17 ENCOUNTER — Emergency Department (HOSPITAL_COMMUNITY)
Admission: EM | Admit: 2011-01-17 | Discharge: 2011-01-18 | Disposition: A | Discharge status: Home / Self Care | Payer: Medicare Other | Attending: Emergency Medicine | Admitting: Emergency Medicine

## 2011-01-17 DIAGNOSIS — M545 Low back pain, unspecified: Secondary | ICD-10-CM | POA: Insufficient documentation

## 2011-01-17 DIAGNOSIS — E785 Hyperlipidemia, unspecified: Secondary | ICD-10-CM | POA: Insufficient documentation

## 2011-01-17 DIAGNOSIS — I10 Essential (primary) hypertension: Secondary | ICD-10-CM | POA: Insufficient documentation

## 2011-01-17 DIAGNOSIS — B009 Herpesviral infection, unspecified: Secondary | ICD-10-CM | POA: Insufficient documentation

## 2011-01-17 DIAGNOSIS — E119 Type 2 diabetes mellitus without complications: Secondary | ICD-10-CM | POA: Insufficient documentation

## 2011-01-17 MED ORDER — KETOROLAC TROMETHAMINE 60 MG/2ML IM SOLN
60.0000 mg | Freq: Once | INTRAMUSCULAR | Status: AC
  Administered 2011-01-17: 60 mg via INTRAMUSCULAR
  Filled 2011-01-17: qty 2

## 2011-01-17 NOTE — ED Notes (Signed)
Patient with rt flank pain and lower back pain since end of June when patient had moved some furniture.

## 2011-01-17 NOTE — ED Notes (Signed)
Pt states he has had right sided hip pain and has had a nickel sized knot on right hip. Pain starts in right lower back, radiates around to right hip and into right groin. Described as a constant hurt. Nothing makes it better or worse. Was lifting an item one week ago and the knot appeared. Has urinary frequency. Right hip hurts on palpation. MD at bs to evaluate.

## 2011-01-17 NOTE — ED Provider Notes (Signed)
History     Chief Complaint  Patient presents with  . Flank Pain  . Back Pain   HPI Comments: Pt with hx of chronic back pain - had surgery in 1982, presents with 10 days of back pain that occurred after he lifted some heavy furniture up and down stairs.  He has modeate pain that is achy, mid low back and no radiation that has no asociated fevers, dysuria, numbness, weakness or other lower ext c/o.  He is able to ambulate.  Pain goes away when supine.  No meds pta.  Patient is a 70 y.o. male presenting with flank pain and back pain.  Flank Pain  Back Pain  Pertinent negatives include no fever, no numbness and no weakness.    Past Medical History  Diagnosis Date  . Diabetes mellitus Since 1995  . Hypertension Since 1995  . Hyperlipidemia Since 1995  . Herpes     Past Surgical History  Procedure Date  . Back surgery     Lumbar  . Cervical spine surgery   . Rotator cuff repair     Right  . Wrist surgery     Left  . Hand surgery     Right  . Penile prosthesis implant     Family History  Problem Relation Age of Onset  . Heart attack Father 63    MI  . Heart attack Brother 50    MI  . Heart attack Paternal Uncle     History  Substance Use Topics  . Smoking status: Never Smoker   . Smokeless tobacco: Not on file  . Alcohol Use: Yes     occ. use       Review of Systems  Constitutional: Negative for fever and chills.  HENT: Negative for neck pain.   Gastrointestinal: Negative for nausea, vomiting and diarrhea.  Genitourinary: Negative for flank pain and difficulty urinating.  Musculoskeletal: Positive for back pain.  Skin: Negative for rash.  Neurological: Negative for weakness and numbness.    Physical Exam  BP 136/72  Pulse 64  Temp(Src) 97.7 F (36.5 C) (Oral)  Resp 18  Ht 5\' 10"  (1.778 m)  Wt 171 lb (77.565 kg)  BMI 24.54 kg/m2  SpO2 100%  Physical Exam  Constitutional: He appears well-developed and well-nourished. No distress.  HENT:    Head: Normocephalic and atraumatic.  Eyes: Conjunctivae are normal. No scleral icterus.  Cardiovascular: Normal rate, regular rhythm and intact distal pulses.   Pulmonary/Chest: Effort normal and breath sounds normal.  Abdominal: Soft. There is no tenderness.  Musculoskeletal: He exhibits no edema.       Back:       Mild LBP to palpation over the SI joints  Neurological: He is alert. He has normal strength. No sensory deficit. Coordination normal. GCS eye subscore is 4. GCS verbal subscore is 5. GCS motor subscore is 6.  Reflex Scores:      Patellar reflexes are 2+ on the right side and 2+ on the left side.      Gait normal, strength normal  Skin: Skin is warm and dry. No rash noted. He is not diaphoretic.    ED Course  Procedures  MDM Pt has low back pain that is likely benign - neuro intact, normal VS, no red flags other than age but has identifiable event when it started hurting, toradol, urine.  Pt states his pain is much better after IM toradol - UA was not received by lab, pt states he  does not want to wait for results - will d/c home.  D/w pt the red flags for which to return.    Vida Roller, MD 01/18/11 [phone]

## 2011-01-18 LAB — URINALYSIS, ROUTINE W REFLEX MICROSCOPIC
Bilirubin Urine: NEGATIVE
Ketones, ur: NEGATIVE mg/dL
Specific Gravity, Urine: 1.015 (ref 1.005–1.030)
Urobilinogen, UA: 0.2 mg/dL (ref 0.0–1.0)
pH: 6 (ref 5.0–8.0)

## 2011-01-18 LAB — URINE MICROSCOPIC-ADD ON

## 2011-01-18 NOTE — ED Notes (Signed)
Into room to see pt. States pain is 2/10. Resting comfortably. Call bell at bs. Denies any needs at this time. Will cont to monitor.

## 2011-01-18 NOTE — ED Notes (Signed)
Remains resting in bed on back. Pain 6/10. Denies any needs at this time. Nad. Call bell at bs. Will cont to monitor.

## 2011-01-18 NOTE — ED Notes (Signed)
Into room to see pt. Resting in bed on back. Pain 6/10. Denies any needs. Lights turned off for pt. Comfort. Call bell at bs. Bed in low position and locked with side rails up.

## 2011-01-24 ENCOUNTER — Encounter (HOSPITAL_COMMUNITY): Payer: Self-pay | Admitting: *Deleted

## 2011-01-24 ENCOUNTER — Other Ambulatory Visit: Payer: Self-pay

## 2011-01-24 ENCOUNTER — Emergency Department (HOSPITAL_COMMUNITY)
Admission: EM | Admit: 2011-01-24 | Discharge: 2011-01-25 | Disposition: A | Discharge status: Home / Self Care | Payer: Medicare Other | Attending: Emergency Medicine | Admitting: Emergency Medicine

## 2011-01-24 ENCOUNTER — Emergency Department (HOSPITAL_COMMUNITY): Payer: Medicare Other

## 2011-01-24 DIAGNOSIS — B009 Herpesviral infection, unspecified: Secondary | ICD-10-CM | POA: Insufficient documentation

## 2011-01-24 DIAGNOSIS — E119 Type 2 diabetes mellitus without complications: Secondary | ICD-10-CM | POA: Insufficient documentation

## 2011-01-24 DIAGNOSIS — I10 Essential (primary) hypertension: Secondary | ICD-10-CM | POA: Insufficient documentation

## 2011-01-24 DIAGNOSIS — E785 Hyperlipidemia, unspecified: Secondary | ICD-10-CM | POA: Insufficient documentation

## 2011-01-24 DIAGNOSIS — R1011 Right upper quadrant pain: Secondary | ICD-10-CM

## 2011-01-24 DIAGNOSIS — M79609 Pain in unspecified limb: Secondary | ICD-10-CM | POA: Insufficient documentation

## 2011-01-24 DIAGNOSIS — K5900 Constipation, unspecified: Secondary | ICD-10-CM | POA: Insufficient documentation

## 2011-01-24 LAB — CBC
Hemoglobin: 11.6 g/dL — ABNORMAL LOW (ref 13.0–17.0)
Platelets: 161 10*3/uL (ref 150–400)
RBC: 3.73 MIL/uL — ABNORMAL LOW (ref 4.22–5.81)
WBC: 4.2 10*3/uL (ref 4.0–10.5)

## 2011-01-24 LAB — URINALYSIS, ROUTINE W REFLEX MICROSCOPIC
Bilirubin Urine: NEGATIVE
Ketones, ur: NEGATIVE mg/dL
Specific Gravity, Urine: 1.01 (ref 1.005–1.030)
Urobilinogen, UA: 0.2 mg/dL (ref 0.0–1.0)

## 2011-01-24 LAB — DIFFERENTIAL
Lymphocytes Relative: 51 % — ABNORMAL HIGH (ref 12–46)
Lymphs Abs: 2.2 10*3/uL (ref 0.7–4.0)
Monocytes Relative: 7 % (ref 3–12)
Neutrophils Relative %: 37 % — ABNORMAL LOW (ref 43–77)

## 2011-01-24 LAB — COMPREHENSIVE METABOLIC PANEL
ALT: 13 U/L (ref 0–53)
Alkaline Phosphatase: 48 U/L (ref 39–117)
CO2: 30 mEq/L (ref 19–32)
Chloride: 103 mEq/L (ref 96–112)
GFR calc Af Amer: >60 mL/min (ref >60)
GFR calc non Af Amer: >60 mL/min (ref >60)
Glucose, Bld: 92 mg/dL (ref 70–99)
Potassium: 4.4 mEq/L (ref 3.5–5.1)
Sodium: 139 mEq/L (ref 135–145)
Total Bilirubin: 0.2 mg/dL — ABNORMAL LOW (ref 0.3–1.2)

## 2011-01-24 LAB — URINE MICROSCOPIC-ADD ON

## 2011-01-24 MED ORDER — IOHEXOL 300 MG/ML  SOLN
100.0000 mL | Freq: Once | INTRAMUSCULAR | Status: AC | PRN
  Administered 2011-01-24: 100 mL via INTRAVENOUS

## 2011-01-24 MED ORDER — SODIUM CHLORIDE 0.9 % IV SOLN
999.0000 mL | INTRAVENOUS | Status: DC
  Administered 2011-01-24: 1000 mL via INTRAVENOUS

## 2011-01-24 NOTE — ED Notes (Signed)
Urine carried to lab via [facility]

## 2011-01-24 NOTE — ED Notes (Signed)
Pt denies injury. Full ROM of ext. Good grip strength. No BM in the last few days. Is not taking the metamucile as dr suggested.

## 2011-01-24 NOTE — ED Provider Notes (Signed)
History     Chief Complaint  Patient presents with  . Arm Pain  . Constipation  . Abdominal Pain   HPI Comments: patient c/o lower abd pain for two weeks.  Pain has been intermittent and described as "aching". Pain today also radiated into the right flank area.   Also states that he has been constipated for 3-4 days and taken several doculax tablets with only small amts of brown stool.  He also c/o intermittent episode of "stiffness" to his right arm this afternoon that lasted several minutes and began while he was sitting on the couch. Has since resolved.  He denies having any chest pain, shortness of breath, numbness or weakness today.    Patient is a 70 y.o. male presenting with arm pain, constipation, and abdominal pain. The history is provided by the patient.  Arm Pain This is a new problem. The current episode started today. The problem occurs intermittently. The problem has been gradually improving. Associated symptoms include abdominal pain, a change in bowel habit and urinary symptoms. Pertinent negatives include no chest pain, congestion, coughing, diaphoresis, fatigue, fever, headaches, nausea, neck pain, numbness, sore throat, vomiting or weakness. The symptoms are aggravated by nothing. He has tried nothing for the symptoms. The treatment provided no relief.  Constipation  The current episode started more than 2 weeks ago. The onset was gradual. The problem occurs continuously. The problem has been gradually improving. The pain is mild. The stool is described as hard. Prior successful therapies include laxatives. There was no prior unsuccessful therapy. Associated symptoms include abdominal pain. Pertinent negatives include no fever, no hemorrhoids, no nausea, no rectal pain, no vomiting, no chest pain, no headaches, no coughing and no difficulty breathing. He has been behaving normally. He has been eating and drinking normally. Urine output has increased. The last void occurred less than  6 hours ago. His past medical history does not include inflammatory bowel disease, recent abdominal injury, recent antibiotic use or recent change in diet. There were no sick contacts. He has received no recent medical care.  Abdominal Pain The primary symptoms of the illness include abdominal pain. The primary symptoms of the illness do not include fever, fatigue, nausea, vomiting or dysuria.  Additional symptoms associated with the illness include constipation and frequency. Symptoms associated with the illness do not include diaphoresis or urgency. Significant associated medical issues do not include inflammatory bowel disease.  Arm Pain This is a new problem. The current episode started today. The problem occurs intermittently. The problem has been gradually improving. Associated symptoms include abdominal pain. Pertinent negatives include no chest pain and no headaches. The symptoms are aggravated by nothing. He has tried nothing for the symptoms. The treatment provided no relief.    Past Medical History  Diagnosis Date  . Diabetes mellitus Since 1995  . Hypertension Since 1995  . Hyperlipidemia Since 1995  . Herpes     Past Surgical History  Procedure Date  . Back surgery     Lumbar  . Cervical spine surgery   . Rotator cuff repair     Right  . Wrist surgery     Left  . Hand surgery     Right  . Penile prosthesis implant     Family History  Problem Relation Age of Onset  . Heart attack Father 41    MI  . Hypertension Father   . Heart attack Brother 50    MI  . Heart attack Paternal Uncle   .  Diabetes Mother     History  Substance Use Topics  . Smoking status: Never Smoker   . Smokeless tobacco: Not on file  . Alcohol Use: Yes     occ. use       Review of Systems  Constitutional: Negative for fever, diaphoresis, appetite change and fatigue.  HENT: Negative for congestion, sore throat and neck pain.   Respiratory: Negative for cough and chest tightness.     Cardiovascular: Negative for chest pain, palpitations and leg swelling.  Gastrointestinal: Positive for abdominal pain, constipation and change in bowel habit. Negative for nausea, vomiting, blood in stool, anal bleeding, rectal pain and hemorrhoids.  Genitourinary: Positive for frequency and flank pain. Negative for dysuria, urgency and scrotal swelling.  Musculoskeletal: Negative.   Skin: Negative.   Neurological: Negative for dizziness, speech difficulty, weakness, numbness and headaches.  Hematological: Does not bruise/bleed easily.  Psychiatric/Behavioral: Negative for confusion.    Physical Exam  BP 144/98  Pulse 86  Temp(Src) 97.9 F (36.6 C) (Oral)  Resp 20  Ht 5\' 10"  (1.778 m)  Wt 172 lb (78.019 kg)  BMI 24.68 kg/m2  SpO2 98%  Physical Exam  Constitutional: He is oriented to person, place, and time. He appears well-developed and well-nourished.  Non-toxic appearance. No distress.  HENT:  Head: Normocephalic and atraumatic.  Eyes: Pupils are equal, round, and reactive to light.  Neck: Normal range of motion. Neck supple. No JVD present.  Cardiovascular: Normal rate, regular rhythm and normal heart sounds.   Pulmonary/Chest: Effort normal and breath sounds normal.  Abdominal: Soft. He exhibits no distension and no mass. Bowel sounds are decreased. There is no splenomegaly or hepatomegaly. There is tenderness in the right upper quadrant and suprapubic area. There is no rebound, no guarding and no tenderness at McBurney's point.    Musculoskeletal: Normal range of motion.  Lymphadenopathy:    He has no cervical adenopathy.  Neurological: He is alert and oriented to person, place, and time.  Skin: Skin is warm and dry.  Psychiatric: He has a normal mood and affect.    ED Course  Procedures  MDM 0010  Patient resting, watching TV.  NAD.  Vitals are stable.  Non-toxic appearing. Has drank soda.   Abd is soft with mild RUQ tenderness w/o guarding or rebound.  I have  discussed pt hx, results and care plan with the EDP.  I have scheduled pt to return here tomorrow morning to have [place] of his Abdomen at 7:30 am.  He has appt with Dr. Karilyn Cota for next month   Date: 01/24/2011  Rate: 62  Rhythm: normal sinus rhythm  QRS Axis: normal  Intervals: normal  ST/T Wave abnormalities: nonspecific ST changes  Conduction Disutrbances:none  Narrative Interpretation:   Old EKG Reviewed: unchanged  Results for orders placed during the hospital encounter of 01/24/11  CBC      Component Value Range   WBC 4.2  4.0 - 10.5 (K/uL)   RBC 3.73 (*) 4.22 - 5.81 (MIL/uL)   Hemoglobin 11.6 (*) 13.0 - 17.0 (g/dL)   HCT 16.1 (*) 09.6 - 52.0 (%)   MCV 89.0  78.0 - 100.0 (fL)   MCH 31.1  26.0 - 34.0 (pg)   MCHC 34.9  30.0 - 36.0 (g/dL)   RDW 04.5  40.9 - 81.1 (%)   Platelets 161  150 - 400 (K/uL)  DIFFERENTIAL      Component Value Range   Neutrophils Relative 37 (*) 43 - 77 (%)  Neutro Abs 1.6 (*) 1.7 - 7.7 (K/uL)   Lymphocytes Relative 51 (*) 12 - 46 (%)   Lymphs Abs 2.2  0.7 - 4.0 (K/uL)   Monocytes Relative 7  3 - 12 (%)   Monocytes Absolute 0.3  0.1 - 1.0 (K/uL)   Eosinophils Relative 5  0 - 5 (%)   Eosinophils Absolute 0.2  0.0 - 0.7 (K/uL)   Basophils Relative 1  0 - 1 (%)   Basophils Absolute 0.0  0.0 - 0.1 (K/uL)  COMPREHENSIVE METABOLIC PANEL      Component Value Range   Sodium 139  135 - 145 (mEq/L)   Potassium 4.4  3.5 - 5.1 (mEq/L)   Chloride 103  96 - 112 (mEq/L)   CO2 30  19 - 32 (mEq/L)   Glucose, Bld 92  70 - 99 (mg/dL)   BUN 8  6 - 23 (mg/dL)   Creatinine, Ser 4.13  0.50 - 1.35 (mg/dL)   Calcium 9.7  8.4 - 24.4 (mg/dL)   Total Protein 7.6  6.0 - 8.3 (g/dL)   Albumin 3.7  3.5 - 5.2 (g/dL)   AST 15  0 - 37 (U/L)   ALT 13  0 - 53 (U/L)   Alkaline Phosphatase 48  39 - 117 (U/L)   Total Bilirubin 0.2 (*) 0.3 - 1.2 (mg/dL)   GFR calc non Af Amer >60  >60 (mL/min)   GFR calc Af Amer >60  >60 (mL/min)  LIPASE, BLOOD      Component Value Range    Lipase 65 (*) 11 - 59 (U/L)  URINALYSIS, ROUTINE W REFLEX MICROSCOPIC      Component Value Range   Color, Urine YELLOW  YELLOW    Appearance CLEAR  CLEAR    Specific Gravity, Urine 1.010  1.005 - 1.030    pH 6.0  5.0 - 8.0    Glucose, UA NEGATIVE  NEGATIVE (mg/dL)   Hgb urine dipstick TRACE (*) NEGATIVE    Bilirubin Urine NEGATIVE  NEGATIVE    Ketones, ur NEGATIVE  NEGATIVE (mg/dL)   Protein, ur NEGATIVE  NEGATIVE (mg/dL)   Urobilinogen, UA 0.2  0.0 - 1.0 (mg/dL)   Nitrite NEGATIVE  NEGATIVE    Leukocytes, UA NEGATIVE  NEGATIVE   URINE MICROSCOPIC-ADD ON      Component Value Range   Squamous Epithelial / LPF RARE  RARE    RBC / HPF 0-2  <3 (RBC/hpf)      Ct Abdomen Pelvis W Contrast  01/24/2011  *RADIOLOGY REPORT*  Clinical Data: No bowel movement for several days.  Right-sided abdominal pain.  History high blood pressure diabetes.  CT ABDOMEN AND PELVIS WITH CONTRAST  Technique:  Multidetector CT imaging of the abdomen and pelvis was performed following the standard protocol during bolus administration of intravenous contrast.  Contrast: 100 ml Omnipaque-300.  Comparison: 06/22/2004.  Findings: No colonic obstructing lesion.  No bowel extraluminal inflammatory process.  Specifically, no inflammation surrounds the appendix.  No free air or free fluid.  Mildly lobulated contour of the liver.  Early cirrhosis not entirely excluded.  Nonspecific 1.4 cm right lobe liver enhancing lesion (series 2 image 20).  Tiny gallstone. Tiny calcification inferior aspect of the pancreas. This does not appear to be a common bile duct stone.  No focal pancreatic, adrenal or left renal lesion.  Tiny inferior right renal lesion possibly a cyst.  Calcification along the periphery of the spleen without focal lesion.  Asymmetric appearance of the prostate gland.  Clinical and laboratory correlation recommended.  Noncontrast filled views of the urinary bladder without gross abnormality.  Mild atherosclerotic type  changes of the abdominal aorta without aneurysmal dilation.  Penile implant is in place.  Prominent degenerative changes lower lumbar spine. Spinal stenosis.  Minimal scarring lung bases.  IMPRESSION: No colonic obstructing lesion.  No bowel extraluminal inflammatory process.  Specifically, no inflammation surrounds the appendix.  Mildly lobulated contour of the liver.  Early cirrhosis not entirely excluded.  Nonspecific 1.4 cm right lobe liver enhancing lesion (series 2 image 20).  Tiny gallstone.  Asymmetric appearance of the prostate gland.  Clinical and laboratory correlation recommended.  Penile implant is in place.  Prominent degenerative changes lower lumbar spine. Spinal stenosis.  Original Report Authenticated By: Fuller Canada, [name]    Tammy L. [place], [place] 01/25/11 0026  Medical screening examination/treatment/procedure(s) were performed by non-physician practitioner and as supervising physician I was immediately available for consultation/collaboration.   Sunnie Nielsen, MD 01/30/11 [phone]

## 2011-01-24 NOTE — ED Notes (Signed)
Pt c/o right arm pain, right sided abdominal pain, and constipation.

## 2011-01-25 ENCOUNTER — Ambulatory Visit (HOSPITAL_COMMUNITY)
Admit: 2011-01-25 | Discharge: 2011-01-25 | Disposition: A | Discharge status: Home / Self Care | Payer: Medicare Other | Source: Ambulatory Visit | Attending: Emergency Medicine | Admitting: Emergency Medicine

## 2011-01-25 DIAGNOSIS — R109 Unspecified abdominal pain: Secondary | ICD-10-CM | POA: Insufficient documentation

## 2011-01-25 DIAGNOSIS — R932 Abnormal findings on diagnostic imaging of liver and biliary tract: Secondary | ICD-10-CM | POA: Insufficient documentation

## 2011-01-25 MED ORDER — OMEPRAZOLE 40 MG PO CPDR: 20.0000 mg | DELAYED_RELEASE_CAPSULE | Freq: Every day | ORAL | Status: DC

## 2011-01-25 MED ORDER — OMEPRAZOLE 40 MG PO CPDR: 40.0000 mg | DELAYED_RELEASE_CAPSULE | Freq: Every day | ORAL | Status: DC

## 2011-02-20 ENCOUNTER — Ambulatory Visit (INDEPENDENT_AMBULATORY_CARE_PROVIDER_SITE_OTHER): Payer: Medicare Other | Admitting: Internal Medicine

## 2011-02-20 ENCOUNTER — Telehealth (INDEPENDENT_AMBULATORY_CARE_PROVIDER_SITE_OTHER): Payer: Self-pay | Admitting: Internal Medicine

## 2011-02-20 VITALS — BP 142/72 | HR 72 | Temp 98.0°F | Ht 70.0 in | Wt 162.6 lb

## 2011-02-20 DIAGNOSIS — R1013 Epigastric pain: Secondary | ICD-10-CM

## 2011-02-20 NOTE — Progress Notes (Signed)
Subjective:     Patient ID: [name], male   DOB: 1940/07/28, 70 y.o.   MRN: [id number]  HPI  Presents today with c/o of rt flank pain and back pain.  Pain radiates into rt groin.  Symptoms x 3 weeks.  He said his back has been hurting for 3 weeks since lifting furniture. He also c/o epigastric growling.No dysphagia.  He does tell me that steak is slow to go down.    He also says he had diarrhea Saturday morning x 2 .  Stools were very loose.   Today  He says his stoools are back to normal.   EGD in January of this year which revealed no evidence of erosive or ulcerative esophagitis. 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  Esophagus dilated by passing 56 French Maloney dilator where no mucosal disruption noted.  Appetite is very good.  He has lost about 8 pounds since his last visit. No melena or bright red rectal bleeding. He was seen in the ED last month at [facility] and underwent a CT scan abdomen which revealed gallstones.  The CBD was 9 mm.He has seen Dr. Gabriel Cirri for this. His liver enzymes are normal.       *RADIOLOGY REPORT* 01/25/2011 Clinical Data: Abdominal pain.  COMPLETE ABDOMINAL ULTRASOUND  Comparison: CT of 1 day prior. No prior ultrasound.  Findings:  Gallbladder: Numerous echogenic nonshadowing foci within the  gallbladder. These appear mobile. No wall thickening or  pericholecystic fluid. Sonographic Murphy's sign was not elicited.  Common bile duct: Minimally dilated for age. Measures up to 9 mm.  Upper limits of normal 7 mm in this age group.  Liver: Normal in echogenicity, without focal lesion.  IVC: Negative  Pancreas: Obscured by bowel gas.  Spleen: Normal in size and echogenicity.  Right Kidney: 9.8 cm. No hydronephrosis.  Left Kidney: 10.7 cm. No hydronephrosis.  Abdominal aorta: Nonaneurysmal without ascites.  IMPRESSION:  1. Numerous echogenic nonshadowing foci within the gallbladder.  Favor stones. Sludge  could look similar.  2. No evidence of acute cholecystitis.  3. Common duct minimally dilated for age. No obstructive stone or  mass identified on yesterday's CT. If the bilirubin levels are  elevated to suggest biliary obstruction, consider MRCP.  Original Report Authenticated By: Consuello Bossier, [name]     01/24/2011  *RADIOLOGY REPORT*  Clinical Data: No bowel movement for several days.  Right-sided abdominal pain.  History high blood pressure diabetes.  CT ABDOMEN AND PELVIS WITH CONTRAST  Technique:  Multidetector CT imaging of the abdomen and pelvis was performed following the standard protocol during bolus administration of intravenous contrast.  Contrast: 100 ml Omnipaque-300.  Comparison: 06/22/2004.  Findings: No colonic obstructing lesion.  No bowel extraluminal inflammatory process.  Specifically, no inflammation surrounds the appendix.  No free air or free fluid.  Mildly lobulated contour of the liver.  Early cirrhosis not entirely excluded.  Nonspecific 1.4 cm right lobe liver enhancing lesion (series 2 image 20).  Tiny gallstone. Tiny calcification inferior aspect of the pancreas. This does not appear to be a common bile duct stone.  No focal pancreatic, adrenal or left renal lesion.  Tiny inferior right renal lesion possibly a cyst.  Calcification along the periphery of the spleen without focal lesion.  Asymmetric appearance of the prostate gland.  Clinical and laboratory correlation recommended.  Noncontrast filled views of the urinary bladder without gross abnormality.  Mild atherosclerotic type changes of the  abdominal aorta without aneurysmal dilation.  Penile implant is in place.  Prominent degenerative changes lower lumbar spine. Spinal stenosis.  Minimal scarring lung bases.  IMPRESSION: No colonic obstructing lesion.  No bowel extraluminal inflammatory process.  Specifically, no inflammation surrounds the appendix.  Mildly lobulated contour of the liver.  Early cirrhosis not entirely  excluded.  Nonspecific 1.4 cm right lobe liver enhancing lesion (series 2 image 20).  Tiny gallstone.  Asymmetric appearance of the prostate gland.  Clinical and laboratory correlation recommended.  Penile implant is in place.  Prominent degenerative changes lower lumbar spine. Spinal stenosis.  Original Report Authenticated By: Fuller Canada, [name]            01/24/2011     Component Results       Component Value Range & Units Status    Sodium 139 135 - 145 mEq/L Final    Potassium 4.4 3.5 - 5.1 mEq/L Final    Chloride 103 96 - 112 mEq/L Final    CO2 30 19 - 32 mEq/L Final    Glucose, Bld 92 70 - 99 mg/dL Final    BUN 8 6 - 23 mg/dL Final    Creatinine, Ser 0.92 0.50 - 1.35 mg/dL Final    Calcium 9.7 8.4 - 10.5 mg/dL Final    Total Protein 7.6 6.0 - 8.3 g/dL Final    Albumin 3.7 3.5 - 5.2 g/dL Final    AST 15 0 - 37 U/L Final    ALT 13 0 - 53 U/L Final    Alkaline Phosphatase 48 39 - 117 U/L Final    Total Bilirubin 0.2 (L) 0.3 - 1.2 mg/dL Final    GFR calc non Af Amer >60 >60 mL/min Final    GFR calc Af Amer >60 >60 mL/min Final           The eGFR has been calculated using the MDRD equation. This calculation has not been validated in all clinical situations. eGFR's persistently <60 mL/min signify possible Chronic Kidney Disease.              Component  Value  Range & Units  Status    WBC  4.2  4.0 - 10.5 K/uL  Final    RBC  3.73 (L)  4.22 - 5.81 MIL/uL  Final    Hemoglobin  11.6 (L)  13.0 - 17.0 g/dL  Final    HCT  13.0 (L)  39.0 - 52.0 %  Final    MCV  89.0  78.0 - 100.0 fL  Final    MCH  31.1  26.0 - 34.0 pg  Final    MCHC  34.9  30.0 - 36.0 g/dL  Final    RDW  86.5  78.4 - 15.5 %  Final    Platelets  161        PAST MEDICAL HISTORY:  He has hypertension, chronic GERD.  He has had   his esophagus dilated in October 2004 and in July 2006 and possibly in   2009 obtained at [facility].  He has history of colonic polyps with multiple   adenomas removed in the past.   His last colonoscopy was about 2 years   ago at [facility].  He has diabetes mellitus.  He has chronic low back pain.   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  He also has had surgery on his left forearm in 2005.  He had surgery on his right shoulder for repair of rotator cuff tear in   2007 and he had decompression of the right carpal tunnel in 2008.  The   patient states he had a cardiac cath within the last year and half and   he was told no abnormality was found.      FAMILY HISTORY:  Negative for colorectal carcinoma, but father died of   CAD at age 29 and mother had CHF and died at age 29.      SOCIAL HISTORY:  He is retired.  He is presently single.  He does not   drink alcohol and he quit cigarette smoking over 20 years ago.           Review of Systems see hpi     Objective:   Physical ExamAlert and oriented. Skin warm and dry. Oral mucosa is moist. Sclera anicteric, conjunctivae is pink. Thyroid not enlarged. No cervical lymphadenopathy. Lungs clear. Heart regular rate and rhythm.  Abdomen is soft. Bowel sounds are positive. No hepatomegaly. No abdominal masses.  Slight epigastric tenderness. No edema to lower extremities. Patient is alert and oriented.              Assessment:    Epigastric tenderness, possible PUD.  He does not have rt upper quadrant tenderness at this visit. His liver enzymes have been normal.  As far as his back pain is concerned, this is probably a strain from lifting furniture.    Plan:    Will check an H pylori on him today. Will also repeat a hepatic profile in 2 weeks since his CBD is dilated to 9mm. Further recommendations once we have the H. Pylori back. Stop the Omeprazole and start Dexilant 60mg  po daily before breakfast.

## 2011-02-20 NOTE — Telephone Encounter (Signed)
Will need a Hepatic function in 2 weeks.  Tammy: Hepatic function in 2 weeks.

## 2011-02-21 ENCOUNTER — Telehealth (INDEPENDENT_AMBULATORY_CARE_PROVIDER_SITE_OTHER): Payer: Self-pay | Admitting: *Deleted

## 2011-02-21 NOTE — Telephone Encounter (Signed)
Per Dorene Ar ,NP - The patient is to have repeat LFT's in 2 weeks. The lab order has been completed and faxed to [facility].

## 2011-02-22 ENCOUNTER — Telehealth (INDEPENDENT_AMBULATORY_CARE_PROVIDER_SITE_OTHER): Payer: Self-pay | Admitting: Internal Medicine

## 2011-02-22 NOTE — Telephone Encounter (Signed)
3 MTH U/S NOTED IN RECALL

## 2011-02-22 NOTE — Telephone Encounter (Signed)
I spoke with Trai this am. He is h. pyloria positive.  Will treat with Pylera. Samples for 10 days given with an Rx for Omeprazole 20mg  BID x 10.    Ann, He will need an US abdomen in 3 mos.  Dx: Dilated Common bile duct.

## 2011-02-26 ENCOUNTER — Telehealth (INDEPENDENT_AMBULATORY_CARE_PROVIDER_SITE_OTHER): Payer: Self-pay | Admitting: Internal Medicine

## 2011-02-26 NOTE — Telephone Encounter (Signed)
This has been addressed. Pylera samples given to patient.

## 2011-03-14 ENCOUNTER — Telehealth (INDEPENDENT_AMBULATORY_CARE_PROVIDER_SITE_OTHER): Payer: Self-pay | Admitting: Internal Medicine

## 2011-03-14 ENCOUNTER — Encounter (INDEPENDENT_AMBULATORY_CARE_PROVIDER_SITE_OTHER): Payer: Self-pay | Admitting: *Deleted

## 2011-03-14 DIAGNOSIS — K838 Other specified diseases of biliary tract: Secondary | ICD-10-CM

## 2011-03-14 NOTE — Telephone Encounter (Signed)
appt set 1 mth

## 2011-03-14 NOTE — Telephone Encounter (Signed)
Needs Hepatic function. Needs OV in one month

## 2011-03-15 LAB — HEPATIC FUNCTION PANEL
ALT: 16 U/L (ref 0–53)
Bilirubin, Direct: 0.1 mg/dL (ref 0.0–0.3)
Indirect Bilirubin: 0.3 mg/dL (ref 0.0–0.9)

## 2011-04-03 LAB — GC/CHLAMYDIA PROBE AMP, GENITAL: Chlamydia, DNA Probe: NEGATIVE

## 2011-04-09 ENCOUNTER — Ambulatory Visit (INDEPENDENT_AMBULATORY_CARE_PROVIDER_SITE_OTHER): Payer: Medicare Other | Admitting: Internal Medicine

## 2011-04-09 ENCOUNTER — Encounter (INDEPENDENT_AMBULATORY_CARE_PROVIDER_SITE_OTHER): Payer: Self-pay | Admitting: Internal Medicine

## 2011-04-09 VITALS — BP 110/62 | HR 72 | Temp 98.2°F | Ht 70.0 in | Wt 162.3 lb

## 2011-04-09 DIAGNOSIS — B9681 Helicobacter pylori [H. pylori] as the cause of diseases classified elsewhere: Secondary | ICD-10-CM

## 2011-04-09 DIAGNOSIS — K838 Other specified diseases of biliary tract: Secondary | ICD-10-CM

## 2011-04-09 DIAGNOSIS — L98499 Non-pressure chronic ulcer of skin of other sites with unspecified severity: Secondary | ICD-10-CM

## 2011-04-09 DIAGNOSIS — K219 Gastro-esophageal reflux disease without esophagitis: Secondary | ICD-10-CM

## 2011-04-09 DIAGNOSIS — A048 Other specified bacterial intestinal infections: Secondary | ICD-10-CM

## 2011-04-09 LAB — STREP A DNA PROBE: Group A Strep Probe: NEGATIVE

## 2011-04-09 LAB — RAPID STREP SCREEN (MED CTR MEBANE ONLY): Streptococcus, Group A Screen (Direct): NEGATIVE

## 2011-04-09 NOTE — Progress Notes (Signed)
Subjective:     Patient ID: [name], male   DOB: 08-04-1940, 70 y.o.   MRN: [id number]  HPI  [name] is a 71 yr old black male here today for f/u.  He was last seen in August for abdominal pain. He was H.pylori and has received treatment.    His last EGD was in January of this year which reveal no evidence of erosive or ulcerative esophagitis. Noncritical incomplete ring 4cm proximal to GE Junction.   His epigastric pain is much better.  He does have a cough with a creamy mucous. He denies any type of epigastric pain at this time.  His appetite is good. No weight loss.  He has a BM about once a day.  He also has a dilated CBD at 9mm.  He will have a repeat US abdomen in November. His liver enzymes have been normal.    01/2011.     *RADIOLOGY REPORT*  Clinical Data: Abdominal pain.  COMPLETE ABDOMINAL ULTRASOUND  Comparison: CT of 1 day prior. No prior ultrasound.  Findings:  Gallbladder: Numerous echogenic nonshadowing foci within the  gallbladder. These appear mobile. No wall thickening or  pericholecystic fluid. Sonographic Murphy's sign was not elicited.  Common bile duct: Minimally dilated for age. Measures up to 9 mm.  Upper limits of normal 7 mm in this age group.  Liver: Normal in echogenicity, without focal lesion.  IVC: Negative  Pancreas: Obscured by bowel gas.  Spleen: Normal in size and echogenicity.  Right Kidney: 9.8 cm. No hydronephrosis.  Left Kidney: 10.7 cm. No hydronephrosis.  Abdominal aorta: Nonaneurysmal without ascites.  IMPRESSION:  1. Numerous echogenic nonshadowing foci within the gallbladder.  Favor stones. Sludge could look similar.  2. No evidence of acute cholecystitis.  3. Common duct minimally dilated for age. No obstructive stone or  mass identified on yesterday's CT. If the bilirubin levels are  elevated to suggest biliary obstruction, consider MRCP.  Original Report Authenticated By: Consuello Bossier, [name]       Review of Systems see  hpi     Current Outpatient Prescriptions  Medication Sig Dispense Refill  . amLODipine (NORVASC) 2.5 MG tablet TAKE ONE TABLET BY MOUTH ONCE A DAY  30 tablet  12  . aspirin 81 MG EC tablet Take 81 mg by mouth daily.        Marland Kitchen glyBURIDE-metformin (GLUCOVANCE) 5-500 MG per tablet Take 2 tablets by mouth 2 (two) times daily.        . metoprolol (TOPROL-XL) 50 MG 24 hr tablet Take 50 mg by mouth daily.        . naproxen (NAPROSYN) 500 MG tablet Take 500 mg by mouth 2 (two) times daily as needed.       . pantoprazole (PROTONIX) 40 MG tablet Take 40 mg by mouth 2 (two) times daily.        . ramipril (ALTACE) 5 MG capsule Take 5 mg by mouth 2 (two) times daily.        Marland Kitchen omeprazole (PRILOSEC) 40 MG capsule Take 40 mg by mouth daily.        Marland Kitchen omeprazole (PRILOSEC) 40 MG capsule Take 1 capsule (40 mg total) by mouth daily.  20 capsule  0     Past Surgical History  Procedure Date  . Back surgery     Lumbar  . Cervical spine surgery   . Rotator cuff repair     Right  . Wrist surgery     Left  .  Hand surgery     Right  . Penile prosthesis implant    Past Medical History  Diagnosis Date  . Diabetes mellitus Since 1995  . Hypertension Since 1995  . Hyperlipidemia Since 1995  . Herpes    Allergies  Allergen Reactions  . Aspirin     REACTION: higher dosage upsets stomach    Objective:   Physical Exam  Filed Vitals:   04/09/11 1506  BP: 110/62  Pulse: 72  Temp: 98.2 F (36.8 C)  Height: 5\' 10"  (1.778 m)  Weight: 162 lb 4.8 oz (73.619 kg)    Alert and oriented. Skin warm and dry. Oral mucosa is moist. Natural teeth in good condition. Sclera anicteric, conjunctivae is pink. Thyroid not enlarged. No cervical lymphadenopathy. Lungs clear. Heart regular rate and rhythm.  Abdomen is soft. Bowel sounds are positive. No hepatomegaly. No abdominal masses felt. No tenderness.  No edema to lower extremities. Patient is alert and oriented.     Assessment:     GERD, H pylori.  His symptoms  are much better.   Dilated CBD with normal Hepatic function.   Plan:     He will follow up in 3 month with a Hepatic function.    Will repeat a US abdomen on him to check his CBD.

## 2011-04-10 LAB — DIFFERENTIAL
Basophils Absolute: 0
Basophils Relative: 1
Monocytes Absolute: 0.3
Neutro Abs: 3.1
Neutrophils Relative %: 65

## 2011-04-10 LAB — BASIC METABOLIC PANEL
BUN: 10
Calcium: 9.7
GFR calc non Af Amer: >60
Glucose, Bld: 267 — ABNORMAL HIGH
Sodium: 136

## 2011-04-10 LAB — POCT CARDIAC MARKERS
Myoglobin, poc: 127
Troponin i, poc: <0.05

## 2011-04-10 LAB — CBC
Hemoglobin: 14
MCHC: 34.3
Platelets: 210
RDW: 12.8

## 2011-04-11 ENCOUNTER — Telehealth (INDEPENDENT_AMBULATORY_CARE_PROVIDER_SITE_OTHER): Payer: Self-pay | Admitting: *Deleted

## 2011-04-11 DIAGNOSIS — A048 Other specified bacterial intestinal infections: Secondary | ICD-10-CM

## 2011-04-11 NOTE — Telephone Encounter (Signed)
Per terri on 04-10-11 the patient will need Hepatic Function in 3 months

## 2011-04-12 LAB — BASIC METABOLIC PANEL
BUN: 12 mg/dL (ref 6–23)
Calcium: 9.3 mg/dL (ref 8.4–10.5)
Chloride: 104 mEq/L (ref 96–112)
Creatinine, Ser: 1.11 mg/dL (ref 0.4–1.5)

## 2011-04-19 LAB — BASIC METABOLIC PANEL
CO2: 27
Calcium: 8.7
Creatinine, Ser: 1.03
GFR calc Af Amer: >60
Glucose, Bld: 153 — ABNORMAL HIGH

## 2011-04-19 LAB — DIFFERENTIAL
Basophils Absolute: 0
Basophils Relative: 0
Neutro Abs: 2.6
Neutrophils Relative %: 57

## 2011-04-19 LAB — CBC
MCHC: 34.1
RBC: 3.91 — ABNORMAL LOW
RDW: 13

## 2011-04-19 LAB — POCT CARDIAC MARKERS
CKMB, poc: 2
Myoglobin, poc: 86.5

## 2011-04-20 ENCOUNTER — Encounter (INDEPENDENT_AMBULATORY_CARE_PROVIDER_SITE_OTHER): Payer: Medicare Other | Admitting: Ophthalmology

## 2011-04-20 DIAGNOSIS — E11319 Type 2 diabetes mellitus with unspecified diabetic retinopathy without macular edema: Secondary | ICD-10-CM

## 2011-04-20 DIAGNOSIS — H5340 Unspecified visual field defects: Secondary | ICD-10-CM

## 2011-04-20 DIAGNOSIS — H3530 Unspecified macular degeneration: Secondary | ICD-10-CM

## 2011-04-20 DIAGNOSIS — H43819 Vitreous degeneration, unspecified eye: Secondary | ICD-10-CM

## 2011-05-17 ENCOUNTER — Encounter (INDEPENDENT_AMBULATORY_CARE_PROVIDER_SITE_OTHER): Payer: Self-pay | Admitting: *Deleted

## 2011-05-30 ENCOUNTER — Ambulatory Visit (HOSPITAL_COMMUNITY)
Admission: RE | Admit: 2011-05-30 | Discharge: 2011-05-30 | Disposition: A | Discharge status: Home / Self Care | Payer: Medicare Other | Source: Ambulatory Visit | Attending: Internal Medicine | Admitting: Internal Medicine

## 2011-05-30 ENCOUNTER — Other Ambulatory Visit (INDEPENDENT_AMBULATORY_CARE_PROVIDER_SITE_OTHER): Payer: Self-pay | Admitting: Internal Medicine

## 2011-05-30 DIAGNOSIS — R932 Abnormal findings on diagnostic imaging of liver and biliary tract: Secondary | ICD-10-CM | POA: Insufficient documentation

## 2011-05-30 DIAGNOSIS — K838 Other specified diseases of biliary tract: Secondary | ICD-10-CM | POA: Insufficient documentation

## 2011-05-31 LAB — HEPATIC FUNCTION PANEL
ALT: 22 U/L (ref 0–53)
AST: 21 U/L (ref 0–37)
Albumin: 4.3 g/dL (ref 3.5–5.2)
Total Protein: 6.8 g/dL (ref 6.0–8.3)

## 2011-06-04 ENCOUNTER — Telehealth (INDEPENDENT_AMBULATORY_CARE_PROVIDER_SITE_OTHER): Payer: Self-pay | Admitting: Internal Medicine

## 2011-06-04 NOTE — Telephone Encounter (Signed)
Results given to patient. He has no GI symptoms at this time.

## 2011-07-05 ENCOUNTER — Encounter (INDEPENDENT_AMBULATORY_CARE_PROVIDER_SITE_OTHER): Payer: Self-pay | Admitting: *Deleted

## 2011-07-16 ENCOUNTER — Other Ambulatory Visit (INDEPENDENT_AMBULATORY_CARE_PROVIDER_SITE_OTHER): Payer: Self-pay | Admitting: Internal Medicine

## 2011-07-17 LAB — HEPATIC FUNCTION PANEL
Albumin: 4 g/dL (ref 3.5–5.2)
Total Bilirubin: 0.5 mg/dL (ref 0.3–1.2)

## 2011-08-15 DIAGNOSIS — R079 Chest pain, unspecified: Secondary | ICD-10-CM

## 2011-08-19 ENCOUNTER — Other Ambulatory Visit: Payer: Self-pay

## 2011-08-19 ENCOUNTER — Emergency Department (HOSPITAL_COMMUNITY)
Admission: EM | Admit: 2011-08-19 | Discharge: 2011-08-19 | Disposition: A | Discharge status: Home / Self Care | Payer: Medicare Other | Attending: Emergency Medicine | Admitting: Emergency Medicine

## 2011-08-19 ENCOUNTER — Emergency Department (HOSPITAL_COMMUNITY): Payer: Medicare Other

## 2011-08-19 ENCOUNTER — Encounter (HOSPITAL_COMMUNITY): Payer: Self-pay | Admitting: *Deleted

## 2011-08-19 DIAGNOSIS — R05 Cough: Secondary | ICD-10-CM

## 2011-08-19 DIAGNOSIS — E119 Type 2 diabetes mellitus without complications: Secondary | ICD-10-CM | POA: Insufficient documentation

## 2011-08-19 DIAGNOSIS — R079 Chest pain, unspecified: Secondary | ICD-10-CM | POA: Insufficient documentation

## 2011-08-19 DIAGNOSIS — R059 Cough, unspecified: Secondary | ICD-10-CM | POA: Insufficient documentation

## 2011-08-19 DIAGNOSIS — E785 Hyperlipidemia, unspecified: Secondary | ICD-10-CM | POA: Insufficient documentation

## 2011-08-19 DIAGNOSIS — Z79899 Other long term (current) drug therapy: Secondary | ICD-10-CM | POA: Insufficient documentation

## 2011-08-19 DIAGNOSIS — I10 Essential (primary) hypertension: Secondary | ICD-10-CM | POA: Insufficient documentation

## 2011-08-19 DIAGNOSIS — Z7982 Long term (current) use of aspirin: Secondary | ICD-10-CM | POA: Insufficient documentation

## 2011-08-19 MED ORDER — BENZONATATE 200 MG PO CAPS: 200.0000 mg | ORAL_CAPSULE | Freq: Three times a day (TID) | ORAL | Status: DC | PRN

## 2011-08-19 MED ORDER — AZITHROMYCIN 250 MG PO TABS: 250.0000 mg | ORAL_TABLET | Freq: Every day | ORAL | Status: DC

## 2011-08-19 NOTE — ED Notes (Signed)
D/c instructions reviewed w/ pt - pt denies any further questions or concerns at present.   

## 2011-08-19 NOTE — ED Notes (Signed)
Pt states he began having central chest pain last Tuesday, pt was admitted to [facility] for cardiac observation - pt states he was discharged and continued to have chest pain. Pt states he has a dry cough - denies pain w/ movement or worse with cough.

## 2011-08-19 NOTE — ED Provider Notes (Signed)
History     CSN: [id number]  Arrival date & time 08/19/11  4098   First MD Initiated Contact with Patient 08/19/11 0321      Chief Complaint  Patient presents with  . Chest Pain  . Cough    (Consider location/radiation/quality/duration/timing/severity/associated sxs/prior treatment) HPI Comments: 71 year old male with a history of diabetes, hypertension, hyperlipidemia who presents with a complaint of chest pain. He states that he has had this chest pain for the better part of one week, has been admitted to [facility] approximately 5 days ago with chest pain and cough and had been ruled out with cardiac markers. He states that his cough has been ongoing for quite some time but mostly over the last week it has been bothering him. It is nonproductive, nothing makes it better or worse, it is not associated with fevers chills nausea vomiting back pain swelling in the legs or dyspnea or chest pain on exertion. Note that patient does take an ACE inhibitor, Altace.  Patient is a 71 y.o. male presenting with chest pain and cough. The history is provided by the patient and medical records.  Chest Pain Primary symptoms include cough.    Cough Associated symptoms include chest pain.    Past Medical History  Diagnosis Date  . Diabetes mellitus Since 1995  . Hypertension Since 1995  . Hyperlipidemia Since 1995  . Herpes     Past Surgical History  Procedure Date  . Back surgery     Lumbar  . Cervical spine surgery   . Rotator cuff repair     Right  . Wrist surgery     Left  . Hand surgery     Right  . Penile prosthesis implant     Family History  Problem Relation Age of Onset  . Heart attack Father 6    MI  . Hypertension Father   . Heart attack Brother 50    MI  . Heart attack Paternal Uncle   . Diabetes Mother     History  Substance Use Topics  . Smoking status: Never Smoker   . Smokeless tobacco: Not on file  . Alcohol Use: Yes     occ. use        Review of Systems  Respiratory: Positive for cough.   Cardiovascular: Positive for chest pain.  All other systems reviewed and are negative.    Allergies  Aspirin  Home Medications   Current Outpatient Rx  Name Route Sig Dispense Refill  . AMLODIPINE BESYLATE 2.5 MG PO TABS  TAKE ONE TABLET BY MOUTH ONCE A DAY 30 tablet 12  . ASPIRIN 81 MG PO TBEC Oral Take 81 mg by mouth daily.      . AZITHROMYCIN 250 MG PO TABS Oral Take 1 tablet (250 mg total) by mouth daily. 500mg  PO day 1, then 250mg  PO days 205 6 tablet 0  . BENZONATATE 200 MG PO CAPS Oral Take 1 capsule (200 mg total) by mouth 3 (three) times daily as needed for cough. 20 capsule 0  . GLYBURIDE-METFORMIN 5-500 MG PO TABS Oral Take 2 tablets by mouth 2 (two) times daily.      Marland Kitchen METOPROLOL SUCCINATE ER 50 MG PO TB24 Oral Take 50 mg by mouth daily.      Marland Kitchen NAPROXEN 500 MG PO TABS Oral Take 500 mg by mouth 2 (two) times daily as needed.     Marland Kitchen OMEPRAZOLE 40 MG PO CPDR Oral Take 40 mg by mouth daily.      Marland Kitchen  OMEPRAZOLE 40 MG PO CPDR Oral Take 1 capsule (40 mg total) by mouth daily. 20 capsule 0  . PANTOPRAZOLE SODIUM 40 MG PO TBEC Oral Take 40 mg by mouth 2 (two) times daily.      Marland Kitchen RAMIPRIL 5 MG PO CAPS Oral Take 5 mg by mouth 2 (two) times daily.        BP 153/78  Pulse 79  Temp(Src) 97.9 F (36.6 C) (Oral)  Resp 16  Ht 5\' 10"  (1.778 m)  Wt 155 lb (70.308 kg)  BMI 22.24 kg/m2  SpO2 98%  Physical Exam  Nursing note and vitals reviewed. Constitutional: He appears well-developed and well-nourished. No distress.  HENT:  Head: Normocephalic and atraumatic.  Mouth/Throat: Oropharynx is clear and moist. No oropharyngeal exudate.  Eyes: Conjunctivae and EOM are normal. Pupils are equal, round, and reactive to light. Right eye exhibits no discharge. Left eye exhibits no discharge. No scleral icterus.  Neck: Normal range of motion. Neck supple. No JVD present. No thyromegaly present.  Cardiovascular: Normal rate, regular  rhythm, normal heart sounds and intact distal pulses.  Exam reveals no gallop and no friction rub.   No murmur heard. Pulmonary/Chest: Effort normal and breath sounds normal. No respiratory distress. He has no wheezes. He has no rales.  Abdominal: Soft. Bowel sounds are normal. He exhibits no distension and no mass. There is no tenderness.  Musculoskeletal: Normal range of motion. He exhibits no edema and no tenderness.  Lymphadenopathy:    He has no cervical adenopathy.  Neurological: He is alert. Coordination normal.  Skin: Skin is warm and dry. No rash noted. No erythema.  Psychiatric: He has a normal mood and affect. His behavior is normal.    ED Course  Procedures (including critical care time)  Labs Reviewed - No data to display Dg Chest 2 View  08/19/2011  *RADIOLOGY REPORT*  Clinical Data: Cough.  Chest pain.  CHEST - 2 VIEW  Comparison: 10/19/2010  Findings: Hyperinflation suggesting emphysema. The heart size and pulmonary vascularity are normal. The lungs appear clear and expanded without focal air space disease or consolidation. No blunting of the costophrenic angles.  No pneumothorax. Degenerative changes in the thoracic spine.  Stable appearance since previous study.  IMPRESSION: Probable emphysematous change.  No evidence of active pulmonary disease.  Original Report Authenticated By: Marlon Pel, [name]     1. Cough       MDM  Patient has clear lungs, denies any other upper respiratory symptoms including nasal congestion, sore throat, sinus pressure or drainage. He has no reproducible chest pain to palpation and his EKG shows no signs of cardiac ischemia. There is early repolarization in the anterior leads but no signs of left ventricular hypertrophy, ischemia or any other significant abnormalities. Currently blood pressure 153/78, oxygen of 98%, respirations of 16, pulse of 79 and temperature of 97.9.  ED ECG REPORT   Date: 08/19/2011   Rate: 76  Rhythm: normal  sinus rhythm and premature atrial contractions (PAC)  QRS Axis: left  Intervals: normal  ST/T Wave abnormalities: early repolarization  Conduction Disutrbances:none  Narrative Interpretation:   Old EKG Reviewed: unchanged From 01/24/2011, no significant change   Chest xray as interpreted by myself shows no acute infiltrates, ptx or other acute process - likely emphysema.  Will have pt consult with PMD re: stop of the ACE inhibitor as possible source of cough.  Course of Z pak      Vida Roller, MD 08/19/11 [phone]

## 2011-08-21 ENCOUNTER — Emergency Department (HOSPITAL_COMMUNITY): Payer: Medicare Other

## 2011-08-21 ENCOUNTER — Encounter (HOSPITAL_COMMUNITY): Payer: Self-pay | Admitting: *Deleted

## 2011-08-21 ENCOUNTER — Emergency Department (HOSPITAL_COMMUNITY)
Admission: EM | Admit: 2011-08-21 | Discharge: 2011-08-21 | Disposition: A | Discharge status: Home / Self Care | Payer: Medicare Other | Attending: Emergency Medicine | Admitting: Emergency Medicine

## 2011-08-21 ENCOUNTER — Other Ambulatory Visit: Payer: Self-pay

## 2011-08-21 DIAGNOSIS — R0789 Other chest pain: Secondary | ICD-10-CM | POA: Insufficient documentation

## 2011-08-21 DIAGNOSIS — R1013 Epigastric pain: Secondary | ICD-10-CM | POA: Insufficient documentation

## 2011-08-21 DIAGNOSIS — Z7982 Long term (current) use of aspirin: Secondary | ICD-10-CM | POA: Insufficient documentation

## 2011-08-21 DIAGNOSIS — E785 Hyperlipidemia, unspecified: Secondary | ICD-10-CM | POA: Insufficient documentation

## 2011-08-21 DIAGNOSIS — E119 Type 2 diabetes mellitus without complications: Secondary | ICD-10-CM | POA: Insufficient documentation

## 2011-08-21 DIAGNOSIS — R10816 Epigastric abdominal tenderness: Secondary | ICD-10-CM | POA: Insufficient documentation

## 2011-08-21 DIAGNOSIS — B009 Herpesviral infection, unspecified: Secondary | ICD-10-CM | POA: Insufficient documentation

## 2011-08-21 DIAGNOSIS — I10 Essential (primary) hypertension: Secondary | ICD-10-CM | POA: Insufficient documentation

## 2011-08-21 LAB — BASIC METABOLIC PANEL
BUN: 11 mg/dL (ref 6–23)
Chloride: 101 mEq/L (ref 96–112)
GFR calc Af Amer: >90 mL/min (ref >90)
Potassium: 4.1 mEq/L (ref 3.5–5.1)

## 2011-08-21 LAB — CBC
HCT: 34.9 % — ABNORMAL LOW (ref 39.0–52.0)
Hemoglobin: 12 g/dL — ABNORMAL LOW (ref 13.0–17.0)
RDW: 12.4 % (ref 11.5–15.5)
WBC: 4.3 10*3/uL (ref 4.0–10.5)

## 2011-08-21 LAB — HEPATIC FUNCTION PANEL
ALT: 23 U/L (ref 0–53)
AST: 23 U/L (ref 0–37)
Alkaline Phosphatase: 47 U/L (ref 39–117)
Bilirubin, Direct: <0.1 mg/dL (ref 0.0–0.3)

## 2011-08-21 MED ORDER — PREDNISONE 20 MG PO TABS: ORAL_TABLET | ORAL | Status: DC

## 2011-08-21 MED ORDER — CYCLOBENZAPRINE HCL 10 MG PO TABS
10.0000 mg | ORAL_TABLET | Freq: Once | ORAL | Status: AC
  Administered 2011-08-21: 10 mg via ORAL
  Filled 2011-08-21: qty 1

## 2011-08-21 MED ORDER — CYCLOBENZAPRINE HCL 10 MG PO TABS: 10.0000 mg | ORAL_TABLET | Freq: Three times a day (TID) | ORAL | Status: AC | PRN

## 2011-08-21 MED ORDER — SODIUM CHLORIDE 0.9 % IV SOLN
INTRAVENOUS | Status: DC
  Administered 2011-08-21: 15:00:00 via INTRAVENOUS

## 2011-08-21 MED ORDER — TRAMADOL-ACETAMINOPHEN 37.5-325 MG PO TABS: ORAL_TABLET | ORAL | Status: AC

## 2011-08-21 MED ORDER — IOHEXOL 300 MG/ML  SOLN
100.0000 mL | Freq: Once | INTRAMUSCULAR | Status: AC | PRN
  Administered 2011-08-21: 100 mL via INTRAVENOUS

## 2011-08-21 MED ORDER — GI COCKTAIL ~~LOC~~
30.0000 mL | Freq: Once | ORAL | Status: AC
  Administered 2011-08-21: 30 mL via ORAL
  Filled 2011-08-21: qty 30

## 2011-08-21 MED ORDER — ACETAMINOPHEN 325 MG PO TABS
650.0000 mg | ORAL_TABLET | Freq: Once | ORAL | Status: AC
  Administered 2011-08-21: 650 mg via ORAL
  Filled 2011-08-21: qty 2

## 2011-08-21 MED ORDER — SODIUM CHLORIDE 0.9 % IV BOLUS (SEPSIS)
1000.0000 mL | Freq: Once | INTRAVENOUS | Status: AC
  Administered 2011-08-21: 1000 mL via INTRAVENOUS

## 2011-08-21 MED ORDER — TRAMADOL HCL 50 MG PO TABS
100.0000 mg | ORAL_TABLET | Freq: Once | ORAL | Status: AC
  Administered 2011-08-21: 100 mg via ORAL
  Filled 2011-08-21: qty 2

## 2011-08-21 MED ORDER — FAMOTIDINE 20 MG PO TABS
20.0000 mg | ORAL_TABLET | Freq: Once | ORAL | Status: AC
  Administered 2011-08-21: 20 mg via ORAL
  Filled 2011-08-21: qty 1

## 2011-08-21 NOTE — ED Notes (Signed)
Constant cp x 1 1/2 wks - d/c from here x 6 days ago for same.  Denies sob/n/v/d/dizziness.

## 2011-08-21 NOTE — ED Notes (Signed)
Pt stated that since he came in his chest pain has decreased to 4/10

## 2011-08-21 NOTE — ED Notes (Signed)
MD at bedside. 

## 2011-08-21 NOTE — Discharge Instructions (Signed)
Drink plenty of fluids, and it would be best to drink just liquids for the next 24-48 hours until your abdominal pain is gone.  Avoid anything fried, spicy, or greasy. Try not to keep solid food until you see Dr. Karilyn Cota to see if you need to have your esophagus stretched again. Take the prednisone which you state has helped your pain in the past. Take the Ultracet and Flexeril for your chest wall pain.

## 2011-08-21 NOTE — ED Notes (Signed)
Pt back in room from radiology.

## 2011-08-21 NOTE — ED Notes (Signed)
Discharge instructions reviewed with pt, questions answered. Pt verbalized understanding.  

## 2011-08-21 NOTE — ED Provider Notes (Signed)
History   This chart was scribed for Ward Givens, MD by Clarita Crane. The patient was seen in room APA18/APA18. Patient's care was started at 0949.    CSN: [id number]  Arrival date & time 08/21/11  7829   First MD Initiated Contact with Patient 08/21/11 1019      Chief Complaint  Patient presents with  . Chest Pain    (Consider location/radiation/quality/duration/timing/severity/associated sxs/prior treatment) HPI [name] is a 71 y.o. male who presents to the Emergency Department complaining of constant moderate to severe chest pain described as aching onset 1 week ago and worsening since with associated mild productive cough with creamy white sputum, intermittent SOB and difficulty swallowing. Pt states he gets a burning reflux at times and he has trouble eating solids which come back up.  Patient notes chest pain is aggravated with movement of upper extremities and deep breathing. Denies vomiting, diaphoresis. 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 Pt was started on Zpak 2 days ago.  Patient with cardiac catheterization x 3 performed with negative results within the past several years. Pt had a stress done when admitted last week at [facility] for same pain. States he was seen by a cardiologist there who told him he didn't have heart problems. Pt denies prior hx of MI.  Patient with h/o diabetes, HTN, HLD. Also notes having esophageal dilation x 3 performed by Dr. Karilyn Cota the last time was last year.   PCP- [facility] in [facility] Cardiologist Dr Jenne Campus  Past Medical History  Diagnosis Date  . Diabetes mellitus Since 1995  . Hypertension Since 1995  . Hyperlipidemia Since 1995  . Herpes     Past Surgical History  Procedure Date  . Back surgery     Lumbar  . Cervical spine surgery   . Rotator cuff repair     Right  . Wrist surgery     Left  . Hand surgery     Right  . Penile  prosthesis implant     Family History  Problem Relation Age of Onset  . Heart attack Father 61    MI  . Hypertension Father   . Heart attack Brother 50    MI  . Heart attack Paternal Uncle   . Diabetes Mother     History  Substance Use Topics  . Smoking status: Never Smoker   . Smokeless tobacco: Not on file  . Alcohol Use: Yes     occ. use   lives with son    Review of Systems 10 Systems reviewed and are negative for acute change except as noted in the HPI.  Allergies  Aspirin  Home Medications   Current Outpatient Rx  Name Route Sig Dispense Refill  . AMLODIPINE BESYLATE 2.5 MG PO TABS  TAKE ONE TABLET BY MOUTH ONCE A DAY 30 tablet 12  . ASPIRIN 81 MG PO TBEC Oral Take 81 mg by mouth daily.      . AZITHROMYCIN 250 MG PO TABS Oral Take 250 mg by mouth daily. 500mg  PO day 1, then 250mg  PO days 2-5    . GLYBURIDE-METFORMIN 5-500 MG PO TABS Oral Take 2 tablets by mouth 2 (two) times daily.      Marland Kitchen METOPROLOL SUCCINATE ER 50 MG PO TB24 Oral Take 50 mg by mouth daily.      Marland Kitchen NAPROXEN 500 MG PO TABS Oral Take 500 mg  by mouth 2 (two) times daily as needed.     Marland Kitchen OMEPRAZOLE 40 MG PO CPDR Oral Take 40 mg by mouth daily.      Marland Kitchen PANTOPRAZOLE SODIUM 40 MG PO TBEC Oral Take 40 mg by mouth 2 (two) times daily.      Marland Kitchen RAMIPRIL 5 MG PO CAPS Oral Take 5 mg by mouth 2 (two) times daily.        BP 132/72  Pulse 69  Temp(Src) 98 F (36.7 C) (Oral)  Resp 20  Ht 5\' 10"  (1.778 m)  Wt 155 lb (70.308 kg)  BMI 22.24 kg/m2  SpO2 99%  Vital signs normal    Physical Exam  Nursing note and vitals reviewed. Constitutional: He is oriented to person, place, and time. He appears well-developed and well-nourished. No distress.  HENT:  Head: Normocephalic and atraumatic.  Right Ear: External ear normal.  Left Ear: External ear normal.  Nose: Nose normal.  Mouth/Throat: Oropharynx is clear and moist.  Eyes: Conjunctivae and EOM are normal. Pupils are equal, round, and reactive to light.   Neck: Normal range of motion. Neck supple. No tracheal deviation present.  Cardiovascular: Normal rate, regular rhythm, normal heart sounds and intact distal pulses.  Exam reveals no gallop and no friction rub.   No murmur heard. Pulmonary/Chest: Effort normal and breath sounds normal. No respiratory distress. He has no wheezes. He has no rales. He exhibits tenderness (left side).    Abdominal: Soft. Bowel sounds are normal. He exhibits no distension. There is tenderness (minimal to epigastric region).         Active bowel sounds.   Musculoskeletal: Normal range of motion. He exhibits no edema.  Neurological: He is alert and oriented to person, place, and time. No sensory deficit.  Skin: Skin is warm and dry.  Psychiatric: He has a normal mood and affect. His behavior is normal.    ED Course  Procedures (including critical care time)   Medications  0.9 %  sodium chloride infusion (  Intravenous New Bag/Given 08/21/11 1443)  cyclobenzaprine (FLEXERIL) tablet 10 mg (10 mg Oral Given 08/21/11 1152)  traMADol (ULTRAM) tablet 100 mg (100 mg Oral Given 08/21/11 1152)  acetaminophen (TYLENOL) tablet 650 mg (650 mg Oral Given 08/21/11 1152)  gi cocktail (30 mL Oral Given 08/21/11 1204)  famotidine (PEPCID) tablet 20 mg (20 mg Oral Given 08/21/11 1203)  sodium chloride 0.9 % bolus 1,000 mL (1000 mL Intravenous Given 08/21/11 1244)  iohexol (OMNIPAQUE) 300 MG/ML solution 100 mL (100 mL Intravenous Contrast Given 08/21/11 1334)      DIAGNOSTIC STUDIES: Oxygen Saturation is 95% on room air, normal by my interpretation.    COORDINATION OF CARE: 11:39AM- Patient informed of current plan for treatment and evaluation and agrees with plan at this time.  12:46PM- Lab and imaging results reviewed with patient and family.  2:20PM- Patient resting comfortably at this time. States chest pain and abdominal pain have resolved. Will d/c home. Pt states steroids have helped with his back pains in the past.    Results for orders placed during the hospital encounter of 08/21/11  CBC      Component Value Range   WBC 4.3  4.0 - 10.5 (K/uL)   RBC 3.87 (*) 4.22 - 5.81 (MIL/uL)   Hemoglobin 12.0 (*) 13.0 - 17.0 (g/dL)   HCT 84.6 (*) 96.2 - 52.0 (%)   MCV 90.2  78.0 - 100.0 (fL)   MCH 31.0  26.0 - 34.0 (pg)  MCHC 34.4  30.0 - 36.0 (g/dL)   RDW 40.9  81.1 - 91.4 (%)   Platelets 223  150 - 400 (K/uL)  BASIC METABOLIC PANEL      Component Value Range   Sodium 138  135 - 145 (mEq/L)   Potassium 4.1  3.5 - 5.1 (mEq/L)   Chloride 101  96 - 112 (mEq/L)   CO2 29  19 - 32 (mEq/L)   Glucose, Bld 164 (*) 70 - 99 (mg/dL)   BUN 11  6 - 23 (mg/dL)   Creatinine, Ser 7.82  0.50 - 1.35 (mg/dL)   Calcium 95.6  8.4 - 10.5 (mg/dL)   GFR calc non Af Amer 83 (*) >90 (mL/min)   GFR calc Af Amer >90  >90 (mL/min)  POCT I-STAT TROPONIN I      Component Value Range   Troponin i, poc 0.00  0.00 - 0.08 (ng/mL)   Comment 3           HEPATIC FUNCTION PANEL      Component Value Range   Total Protein 7.2  6.0 - 8.3 (g/dL)   Albumin 3.7  3.5 - 5.2 (g/dL)   AST 23  0 - 37 (U/L)   ALT 23  0 - 53 (U/L)   Alkaline Phosphatase 47  39 - 117 (U/L)   Total Bilirubin 0.2 (*) 0.3 - 1.2 (mg/dL)   Bilirubin, Direct <2.1  0.0 - 0.3 (mg/dL)   Indirect Bilirubin NOT CALCULATED  0.3 - 0.9 (mg/dL)  LIPASE, BLOOD      Component Value Range   Lipase 222 (*) 11 - 59 (U/L)   Laboratory interpretation all normal except mild anemia, hyperglycemia   Dg Chest 2 View  08/21/2011  *RADIOLOGY REPORT*  Clinical Data: 71 year old male with left chest pain radiating to the back.  CHEST - 2 VIEW  Comparison: 08/19/2011 and earlier.  Findings: Stable lung volumes. Normal cardiac size and mediastinal contours.  Visualized tracheal air column is within normal limits. The lungs are clear.  EKG button artifact projects over the left fifth rib.  No pneumothorax or effusion. No acute osseous abnormality identified.  IMPRESSION: Negative, no acute  cardiopulmonary abnormality.  Original Report Authenticated By: Harley Hallmark, [name]   Ct Abdomen Pelvis W Contrast  08/21/2011  *RADIOLOGY REPORT*  Clinical Data: Epigastric pain, elevated lipase, ? pancreatitis  CT ABDOMEN AND PELVIS WITH CONTRAST  Technique:  Multidetector CT imaging of the abdomen and pelvis was performed following the standard protocol during bolus administration of intravenous contrast.  Contrast: OMNIPAQUE IOHEXOL 300 MG/ML IV SOLN  Comparison: 01/24/2011  Findings: Lung bases are essentially clear.  Liver, spleen, and adrenal glands are within normal limits.  No definite peripancreatic inflammatory changes to confirm a diagnosis of acute pancreatitis.  No peripancreatic fluid collection or abscess.  Gallbladder is unremarkable.  No intrahepatic or extrahepatic ductal dilatation.  Kidneys are within normal limits.  No hydronephrosis.  No evidence of bowel obstruction. Normal appendix.  Atherosclerotic calcifications of the abdominal aorta and branch vessels.  No abdominopelvic ascites.  No suspicious abdominopelvic lymphadenopathy.  Prostate is unremarkable.  Bladder is within normal limits.  Penile prosthesis with reservoir in the left lower pelvis.  Degenerative changes of the visualized thoracolumbar spine.  IMPRESSION: No definite peripancreatic inflammatory changes to confirm a diagnosis of acute pancreatitis.  No peripancreatic fluid collection or abscess.  No evidence of bowel obstruction.  Normal appendix.  Original Report Authenticated By: Charline Bills, [name]  Date: 08/21/2011  Rate: 77  Rhythm: normal sinus rhythm and premature atrial contractions (PAC)  QRS Axis: normal  Intervals: normal  ST/T Wave abnormalities: nonspecific T wave changes  Conduction Disutrbances:none  Narrative Interpretation: ER  Old EKG Reviewed: unchanged from 08/19/2011 and 01/24/2011  Diagnoses that have been ruled out:  None  Diagnoses that are still under consideration:    None  Final diagnoses:  Chest pain, atypical  Abdominal pain    New Prescriptions   CYCLOBENZAPRINE (FLEXERIL) 10 MG TABLET    Take 1 tablet (10 mg total) by mouth 3 (three) times daily as needed for muscle spasms.   PREDNISONE (DELTASONE) 20 MG TABLET    Take 3 po QD x 3d , then 2 po QD x 3d then 1 po QD x 3d   TRAMADOL-ACETAMINOPHEN (ULTRACET) 37.5-325 MG PER TABLET    2 tabs po QID prn pain    Plan discharge  Devoria Albe, MD, FACEP     MDM   I personally performed the services described in this documentation, which was scribed in my presence. The recorded information has been reviewed and considered. Devoria Albe, MD, Armando Gang    Ward Givens, MD 08/21/11 [phone]

## 2011-09-03 ENCOUNTER — Other Ambulatory Visit: Payer: Self-pay

## 2011-09-03 ENCOUNTER — Emergency Department (HOSPITAL_COMMUNITY): Payer: Medicare Other

## 2011-09-03 ENCOUNTER — Emergency Department (HOSPITAL_COMMUNITY)
Admission: EM | Admit: 2011-09-03 | Discharge: 2011-09-03 | Disposition: A | Discharge status: Home Health | Payer: Medicare Other | Attending: Emergency Medicine | Admitting: Emergency Medicine

## 2011-09-03 ENCOUNTER — Encounter (HOSPITAL_COMMUNITY): Payer: Self-pay | Admitting: *Deleted

## 2011-09-03 DIAGNOSIS — J3489 Other specified disorders of nose and nasal sinuses: Secondary | ICD-10-CM | POA: Insufficient documentation

## 2011-09-03 DIAGNOSIS — R0982 Postnasal drip: Secondary | ICD-10-CM | POA: Insufficient documentation

## 2011-09-03 DIAGNOSIS — M549 Dorsalgia, unspecified: Secondary | ICD-10-CM | POA: Insufficient documentation

## 2011-09-03 DIAGNOSIS — R000 Tachycardia, unspecified: Secondary | ICD-10-CM | POA: Insufficient documentation

## 2011-09-03 DIAGNOSIS — I10 Essential (primary) hypertension: Secondary | ICD-10-CM | POA: Insufficient documentation

## 2011-09-03 DIAGNOSIS — J329 Chronic sinusitis, unspecified: Secondary | ICD-10-CM | POA: Insufficient documentation

## 2011-09-03 DIAGNOSIS — Z79899 Other long term (current) drug therapy: Secondary | ICD-10-CM | POA: Insufficient documentation

## 2011-09-03 DIAGNOSIS — Z7982 Long term (current) use of aspirin: Secondary | ICD-10-CM | POA: Insufficient documentation

## 2011-09-03 DIAGNOSIS — R079 Chest pain, unspecified: Secondary | ICD-10-CM | POA: Insufficient documentation

## 2011-09-03 DIAGNOSIS — R05 Cough: Secondary | ICD-10-CM | POA: Insufficient documentation

## 2011-09-03 DIAGNOSIS — E119 Type 2 diabetes mellitus without complications: Secondary | ICD-10-CM | POA: Insufficient documentation

## 2011-09-03 DIAGNOSIS — R059 Cough, unspecified: Secondary | ICD-10-CM | POA: Insufficient documentation

## 2011-09-03 DIAGNOSIS — E785 Hyperlipidemia, unspecified: Secondary | ICD-10-CM | POA: Insufficient documentation

## 2011-09-03 LAB — CBC
MCH: 31.1 pg (ref 26.0–34.0)
MCHC: 35.2 g/dL (ref 30.0–36.0)
MCV: 88.5 fL (ref 78.0–100.0)
Platelets: 164 10*3/uL (ref 150–400)
RBC: 4.34 MIL/uL (ref 4.22–5.81)

## 2011-09-03 LAB — BASIC METABOLIC PANEL
CO2: 29 mEq/L (ref 19–32)
Calcium: 9.5 mg/dL (ref 8.4–10.5)
Creatinine, Ser: 1.08 mg/dL (ref 0.50–1.35)
GFR calc non Af Amer: 68 mL/min — ABNORMAL LOW (ref >90)
Glucose, Bld: 298 mg/dL — ABNORMAL HIGH (ref 70–99)
Sodium: 134 mEq/L — ABNORMAL LOW (ref 135–145)

## 2011-09-03 MED ORDER — MOXIFLOXACIN HCL 400 MG PO TABS: 400.0000 mg | ORAL_TABLET | Freq: Every day | ORAL | Status: DC

## 2011-09-03 MED ORDER — MOXIFLOXACIN HCL 400 MG PO TABS
400.0000 mg | ORAL_TABLET | Freq: Every day | ORAL | Status: DC
  Administered 2011-09-03: 400 mg via ORAL
  Filled 2011-09-03: qty 1

## 2011-09-03 MED ORDER — IOHEXOL 350 MG/ML SOLN
100.0000 mL | Freq: Once | INTRAVENOUS | Status: AC | PRN
  Administered 2011-09-03: 100 mL via INTRAVENOUS

## 2011-09-03 MED ORDER — BENZONATATE 100 MG PO CAPS: 200.0000 mg | ORAL_CAPSULE | Freq: Two times a day (BID) | ORAL | Status: AC | PRN

## 2011-09-03 NOTE — ED Notes (Signed)
Pt reports chest pain for 3 weeks. Productive cough w/ yellow sputum. Pain in chest increased w/ movement & taking deep breath. Pt was admitted to hospital in [place] 3 weeks ago & seen here after that.

## 2011-09-03 NOTE — ED Provider Notes (Signed)
History     CSN: [id number]  Arrival date & time 09/03/11  1308   First MD Initiated Contact with Patient 09/03/11 [phone]      Chief Complaint  Patient presents with  . Chest Pain  . Back Pain  . Cough    (Consider location/radiation/quality/duration/timing/severity/associated sxs/prior treatment) HPI Comments: 71 year old male with a history of 71 year old male with a history of hypertension, diabetes, hyperlipidemia and subacute cough presents with ongoing cough with chest pain and sinus drainage. He states that he has had the same cough and phlegm for the last month. When he coughs he develops a pain in his chest that radiates to her shoulder blade on the left. He denies swelling of the legs, trauma, travel, cigarette abuse or fevers. When he was seen on February 10 he was given a Z-Pak, but still contagious to have symptoms. He states that he did get better for several days but then this cough and sinus drainage came back. He seems to think that the sinus drainage as the initiating event as it dripped down his throat when he tries to cough. He has some difficulty coughing up because of the thick nature of the phlegm. He is still on an ACE inhibitor which I cautioned him against using due to a possible relation to the cough.  Symptoms are persistent, gradually getting worse, not associated with fever or swelling of the legs  Patient is a 71 y.o. male presenting with chest pain, back pain, and cough. The history is provided by the patient and medical records.  Chest Pain Primary symptoms include cough.    Back Pain  Associated symptoms include chest pain.  Cough Associated symptoms include chest pain.    Past Medical History  Diagnosis Date  . Diabetes mellitus Since 1995  . Hypertension Since 1995  . Hyperlipidemia Since 1995  . Herpes     Past Surgical History  Procedure Date  . Back surgery     Lumbar  . Cervical spine surgery   . Rotator cuff repair     Right  . Wrist  surgery     Left  . Hand surgery     Right  . Penile prosthesis implant     Family History  Problem Relation Age of Onset  . Heart attack Father 29    MI  . Hypertension Father   . Heart attack Brother 50    MI  . Heart attack Paternal Uncle   . Diabetes Mother     History  Substance Use Topics  . Smoking status: Never Smoker   . Smokeless tobacco: Not on file  . Alcohol Use: Yes     occ. use       Review of Systems  Respiratory: Positive for cough.   Cardiovascular: Positive for chest pain.  Musculoskeletal: Positive for back pain.  All other systems reviewed and are negative.    Allergies  Aspirin  Home Medications   Current Outpatient Rx  Name Route Sig Dispense Refill  . AMLODIPINE BESYLATE 2.5 MG PO TABS  TAKE ONE TABLET BY MOUTH ONCE A DAY 30 tablet 12  . ASPIRIN 81 MG PO TBEC Oral Take 81 mg by mouth daily.      . GLYBURIDE-METFORMIN 5-500 MG PO TABS Oral Take 2 tablets by mouth 2 (two) times daily.      Marland Kitchen METOPROLOL SUCCINATE ER 50 MG PO TB24 Oral Take 50 mg by mouth daily.      Marland Kitchen NAPROXEN 500 MG PO  TABS Oral Take 500 mg by mouth 2 (two) times daily as needed.     Marland Kitchen OMEPRAZOLE 40 MG PO CPDR Oral Take 40 mg by mouth daily.      Marland Kitchen PANTOPRAZOLE SODIUM 40 MG PO TBEC Oral Take 40 mg by mouth 2 (two) times daily.      Marland Kitchen PREDNISONE 20 MG PO TABS  Take 3 po QD x 3d , then 2 po QD x 3d then 1 po QD x 3d 18 tablet 0  . RAMIPRIL 5 MG PO CAPS Oral Take 5 mg by mouth 2 (two) times daily.      Marland Kitchen BENZONATATE 100 MG PO CAPS Oral Take 2 capsules (200 mg total) by mouth 2 (two) times daily as needed for cough. 20 capsule 0  . MOXIFLOXACIN HCL 400 MG PO TABS Oral Take 1 tablet (400 mg total) by mouth daily. 7 tablet 0    BP 115/71  Pulse 112  Temp(Src) 97.5 F (36.4 C) (Oral)  Resp 16  Ht 5\' 10"  (1.778 m)  Wt 155 lb (70.308 kg)  BMI 22.24 kg/m2  SpO2 98%  Physical Exam  Nursing note and vitals reviewed. Constitutional: He appears well-developed and  well-nourished. No distress.  HENT:  Head: Normocephalic and atraumatic.  Mouth/Throat: Oropharynx is clear and moist. No oropharyngeal exudate.  Eyes: Conjunctivae and EOM are normal. Pupils are equal, round, and reactive to light. Right eye exhibits no discharge. Left eye exhibits no discharge. No scleral icterus.  Neck: Normal range of motion. Neck supple. No JVD present. No thyromegaly present.  Cardiovascular: Regular rhythm, normal heart sounds and intact distal pulses.  Exam reveals no gallop and no friction rub.   No murmur heard.      Sinus tachycardia 105 beats per minute  Pulmonary/Chest: Effort normal and breath sounds normal. No respiratory distress. He has no wheezes. He has no rales.  Abdominal: Soft. Bowel sounds are normal. He exhibits no distension and no mass. There is no tenderness.  Musculoskeletal: Normal range of motion. He exhibits no edema and no tenderness.  Lymphadenopathy:    He has no cervical adenopathy.  Neurological: He is alert. Coordination normal.  Skin: Skin is warm and dry. No rash noted. No erythema.  Psychiatric: He has a normal mood and affect. His behavior is normal.    ED Course  Procedures (including critical care time)  ED ECG REPORT   Date: 09/03/2011   Rate: 105  Rhythm: sinus tachycardia  QRS Axis: left  Intervals: normal  ST/T Wave abnormalities: nonspecific ST/T changes  Conduction Disutrbances:none  Narrative Interpretation: Early repolarization prominent in the anterior leads  Old EKG Reviewed: changes noted and Since EKG from 08/21/2011, and 01/24/2011, there does not appear to be any significant change in the ST segments   Labs Reviewed  CBC - Abnormal; Notable for the following:    HCT 38.4 (*)    All other components within normal limits  BASIC METABOLIC PANEL - Abnormal; Notable for the following:    Sodium 134 (*)    Glucose, Bld 298 (*)    GFR calc non Af Amer 68 (*)    GFR calc Af Amer 78 (*)    All other components  within normal limits   Ct Angio Chest W/cm &/or Wo Cm  09/03/2011  *RADIOLOGY REPORT*  Clinical Data: Chest/left shoulder pain, shortness of breath, evaluate for PE  CT ANGIOGRAPHY CHEST  Technique:  Multidetector CT imaging of the chest using the standard protocol during  bolus administration of intravenous contrast. Multiplanar reconstructed images including MIPs were obtained and reviewed to evaluate the vascular anatomy.  Contrast: OMNIPAQUE IOHEXOL 350 MG/ML IV SOLN  Comparison: Chest radiographs dated 08/21/2011.  Findings: No evidence of pulmonary embolism.  Lungs are essentially clear. 3 mm subpleural nodule in the left lower lobe (series 5/image 61).  Scattered mild patchy/nodular opacities in the bilateral lower lobes.  No pleural effusion or pneumothorax.  Visualized thyroid is unremarkable.  The heart is normal in size of pericardial effusion.  No suspicious mediastinal, hilar, or axillary lymphadenopathy.  The visualized upper abdomen is unremarkable.  Mild degenerative changes of the visualized thoracolumbar spine.  IMPRESSION: No evidence of pulmonary embolism.  Scattered mild patchy/nodular opacities in the bilateral lower lobes, possibly infectious/inflammatory.  Original Report Authenticated By: Charline Bills, [name]     1. Chest pain   2. Sinusitis       MDM  There is no peripheral edema, borderline tachycardia with a pulse of 112, no other acute EKG findings. We'll rule out pulmonary embolism or other pulmonary abnormality with CT of the chest which has not been done in the last several years.  Oxygen levels are 98%, respirations are 16 and unlabored, blood pressure is 115/71 and temperature is 97.5.  ED Course:  Patient was treated with Avelox due 2 sinusitis symptoms and failed outpatient therapy with Zithromax.  He remains clinically stable with a heart rate of less than 100 on my repeat exam, lungs remain clear, oxygen levels remained high.     Labarotory results  reviewed:  Labs reveal overall normal electrolytes, normal renal function, hyperglycemia to 298. There is no anion gap or acidosis. CBC shows normal white blood cells of 5.6, no significant anemia and normal platelets   Radiology imaging reviewed:  CT scan of the chest was reviewed by myself and with the radiologist, it shows no evidence of pulmonary embolism, scattered patchy or nodular opacities in the lower lobes which was read as possibly infectious or inflammatory. Avelox will also cover any infectious etiologies of the lungs   Previous Records reviewed: Multiple visits for chest pain, shortness of breath and coughing with productive sputum including in the last 3 weeks where he was given Zithromax, chest x-ray at that time was unremarkable   Medications given in ED: Avelox  The pt and (or) family members were informed of results and need for close follow up  Consultation: Outpatient followup  Disposition:  Home     Discharge Prescriptions include:  Avelox Hyman Hopes, MD 09/03/11 [phone]

## 2011-09-11 ENCOUNTER — Encounter (INDEPENDENT_AMBULATORY_CARE_PROVIDER_SITE_OTHER): Payer: Self-pay | Admitting: Internal Medicine

## 2011-09-11 ENCOUNTER — Ambulatory Visit (INDEPENDENT_AMBULATORY_CARE_PROVIDER_SITE_OTHER): Payer: Medicare Other | Admitting: Internal Medicine

## 2011-09-11 VITALS — BP 122/70 | HR 80 | Temp 97.5°F | Ht 70.0 in | Wt 156.7 lb

## 2011-09-11 DIAGNOSIS — K219 Gastro-esophageal reflux disease without esophagitis: Secondary | ICD-10-CM | POA: Insufficient documentation

## 2011-09-11 DIAGNOSIS — J40 Bronchitis, not specified as acute or chronic: Secondary | ICD-10-CM | POA: Insufficient documentation

## 2011-09-11 DIAGNOSIS — E7800 Pure hypercholesterolemia, unspecified: Secondary | ICD-10-CM | POA: Insufficient documentation

## 2011-09-11 NOTE — Patient Instructions (Signed)
Z pack. PR in 2 weeks

## 2011-09-11 NOTE — Progress Notes (Signed)
Subjective:     Patient ID: [name], male   DOB: 1940/08/22, 71 y.o.   MRN: [id number]  HPI  [name] is a 71 yr old male for chest pain.   He tells me when he talks his esophagus hurts. He was admitted to [facility] the first of February for chest.  He tells me had a cardiac work up which was negative. He tells me he had been coughing up green mucous.  He had been on an antibiotic for 10 days.  His appetite is good.  No weight loss. He denies abdominal pain.  He does have a cough.  He coughs up green mucous. He has a BM usually every day or ever couple of days. No melena or bright rectal bleeding. He rarely has acid reflux.   Sympotms for about a month. 09/03/2011 CT angio chest : IMPRESSION:  No evidence of pulmonary embolism.  Scattered mild patchy/nodular opacities in the bilateral lower  lobes, possibly infectious/inflammatory.  Original Report Authenticated By: Charline Bills, [name]         Review of Systems see hpi     Current Outpatient Prescriptions  Medication Sig Dispense Refill  . amLODipine (NORVASC) 2.5 MG tablet TAKE ONE TABLET BY MOUTH ONCE A DAY  30 tablet  12  . aspirin 81 MG EC tablet Take 81 mg by mouth daily.        . metoprolol (TOPROL-XL) 50 MG 24 hr tablet Take 50 mg by mouth daily.        . naproxen (NAPROSYN) 500 MG tablet Take 500 mg by mouth 2 (two) times daily as needed.       Marland Kitchen omeprazole (PRILOSEC) 40 MG capsule Take 40 mg by mouth daily.        . ramipril (ALTACE) 5 MG capsule Take 5 mg by mouth 2 (two) times daily.        . benzonatate (TESSALON) 100 MG capsule Take 2 capsules (200 mg total) by mouth 2 (two) times daily as needed for cough.  20 capsule  0   Past Medical History  Diagnosis Date  . Diabetes mellitus Since 1995  . Hypertension Since 1995  . Hyperlipidemia Since 1995  . Herpes    History   Social History  . Marital Status: Divorced    Spouse Name: N/A    Number of Children: 8  . Years of Education: N/A   Occupational  History  . 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 Smoking status: Never Smoker   . Smokeless tobacco: Not on file  . Alcohol Use: Yes     occ. use   . Drug Use: Yes    Special: Marijuana     once a month  . Sexually Active:    Other Topics Concern  . Not on file   Social History Narrative  . No narrative on file   Family Status  Relation Status Death Age  . Father Deceased   . Brother Other     2 deaced. ? cancer. Others in pretty good health.  . Mother Deceased   . Sister Alive     One is a diabetic. Others in good health.   Allergies  Allergen Reactions  . Aspirin     REACTION: higher dosage upsets stomach    Objective:   Physical Exam Filed Vitals:   09/11/11 1052  Height: 5\' 10"  (1.778 m)  Weight: 156 lb 11.2 oz (71.079 kg)  Alert and oriented. Skin warm and dry. Oral mucosa is moist.   . Sclera anicteric, conjunctivae is pink. Thyroid not enlarged. No cervical lymphadenopathy. Lungs clear. Heart regular rate and rhythm.  Abdomen is soft. Bowel sounds are positive. No hepatomegaly. No abdominal masses felt. No tenderness.  No edema to lower extremities. Patient is alert and oriented.      Assessment:   Possible bronchitis given hx /pneumonia    Plan:     Zpak.  PR in 2 weeks.

## 2011-09-26 ENCOUNTER — Encounter (INDEPENDENT_AMBULATORY_CARE_PROVIDER_SITE_OTHER): Payer: Self-pay | Admitting: Internal Medicine

## 2011-09-26 ENCOUNTER — Ambulatory Visit (INDEPENDENT_AMBULATORY_CARE_PROVIDER_SITE_OTHER): Payer: Medicare Other | Admitting: Internal Medicine

## 2011-09-26 DIAGNOSIS — J40 Bronchitis, not specified as acute or chronic: Secondary | ICD-10-CM

## 2011-09-26 DIAGNOSIS — R079 Chest pain, unspecified: Secondary | ICD-10-CM

## 2011-09-26 NOTE — Progress Notes (Signed)
Subjective:     Patient ID: [name], male   DOB: January 05, 1941, 71 y.o.   MRN: [id number]  HPIChester is a 71 yr old male presenting today with c/o left sided chest pain. He c/o left arm tingling.  He can move his arm and he feels a pulling sensation in his arm. Symptoms x 3 months. Appetite is good. No weight loss. Occasionally has acid reflux.  He says when he talks he has an echo. He has a cough and at times coughs up green mucous.  He was also seen in our office first of this month and started on a Z-pack. His symptoms have not gotten any better.  He was seen in the ED last month for cough and chest pain. He underwent a CT angio chest  (see below) 2/2013IMPRESSION:  No evidence of pulmonary embolism.  Scattered mild patchy/nodular opacities in the bilateral lower  lobes, possibly infectious/inflammatory.     Review of Systems see hpi     Current Outpatient Prescriptions  Medication Sig Dispense Refill  . amLODipine (NORVASC) 2.5 MG tablet TAKE ONE TABLET BY MOUTH ONCE A DAY  30 tablet  12  . aspirin 81 MG EC tablet Take 81 mg by mouth daily.        . metoprolol (TOPROL-XL) 50 MG 24 hr tablet Take 50 mg by mouth daily.        . naproxen (NAPROSYN) 500 MG tablet Take 500 mg by mouth 2 (two) times daily as needed.       Marland Kitchen omeprazole (PRILOSEC) 40 MG capsule Take 40 mg by mouth daily.        . ramipril (ALTACE) 5 MG capsule Take 5 mg by mouth 2 (two) times daily.        . simvastatin (ZOCOR) 20 MG tablet Take 20 mg by mouth every evening.       Past Medical History  Diagnosis Date  . Diabetes mellitus Since 1995  . Hypertension Since 1995  . Hyperlipidemia Since 1995  . Herpes    Past Surgical History  Procedure Date  . Back surgery     Lumbar  . Cervical spine surgery   . Rotator cuff repair     Right  . Wrist surgery     Left  . Hand surgery     Right  . Penile prosthesis implant    History   Social History  . Marital Status: Divorced    Spouse Name: N/A      Number of Children: 8  . Years of Education: N/A   Occupational History  . 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 Smoking status: Never Smoker   . Smokeless tobacco: Not on file  . Alcohol Use: Yes     occ. use   . Drug Use: Yes    Special: Marijuana     once a month  . Sexually Active:    Other Topics Concern  . Not on file   Social History Narrative  . No narrative on file   Family Status  Relation Status Death Age  . Father Deceased   . Brother Other     2 deaced. ? cancer. Others in pretty good health.  . Mother Deceased   . Sister Alive     One is a diabetic. Others in good health.   Allergies  Allergen Reactions  . Aspirin     REACTION: higher dosage upsets stomach  Objective:   Physical Exam Filed Vitals:   09/26/11 1145  Height: 5\' 10"  (1.778 m)  Weight: 156 lb 11.2 oz (71.079 kg)   Alert and oriented. Skin warm and dry. Oral mucosa is moist.   . Sclera anicteric, conjunctivae is pink. Thyroid not enlarged. No cervical lymphadenopathy. Lungs clear. Heart regular rate and rhythm.  Abdomen is soft. Bowel sounds are positive. No hepatomegaly. No abdominal masses felt. No tenderness.  No edema to lower extremities. Patient is alert and oriented.      Assessment:    Chest pain. I do not think this is GI related. Bronchitis    Plan:    Follow up with PCP.   Continue present medications.

## 2011-09-26 NOTE — Patient Instructions (Signed)
F/u PCP. Continue present medications

## 2011-10-02 ENCOUNTER — Other Ambulatory Visit (HOSPITAL_COMMUNITY): Payer: Self-pay | Admitting: Family Medicine

## 2011-10-02 ENCOUNTER — Ambulatory Visit (HOSPITAL_COMMUNITY)
Admission: RE | Admit: 2011-10-02 | Discharge: 2011-10-02 | Disposition: A | Discharge status: Home / Self Care | Payer: Medicare Other | Source: Ambulatory Visit | Attending: Family Medicine | Admitting: Family Medicine

## 2011-10-02 DIAGNOSIS — M47812 Spondylosis without myelopathy or radiculopathy, cervical region: Secondary | ICD-10-CM | POA: Insufficient documentation

## 2011-10-02 DIAGNOSIS — R209 Unspecified disturbances of skin sensation: Secondary | ICD-10-CM | POA: Insufficient documentation

## 2011-10-02 DIAGNOSIS — Z981 Arthrodesis status: Secondary | ICD-10-CM | POA: Insufficient documentation

## 2011-10-02 DIAGNOSIS — R200 Anesthesia of skin: Secondary | ICD-10-CM

## 2011-10-06 ENCOUNTER — Emergency Department (HOSPITAL_COMMUNITY)
Admission: EM | Admit: 2011-10-06 | Discharge: 2011-10-06 | Disposition: A | Discharge status: Home / Self Care | Payer: Medicare Other | Attending: Emergency Medicine | Admitting: Emergency Medicine

## 2011-10-06 ENCOUNTER — Encounter (HOSPITAL_COMMUNITY): Payer: Self-pay | Admitting: *Deleted

## 2011-10-06 DIAGNOSIS — J069 Acute upper respiratory infection, unspecified: Secondary | ICD-10-CM | POA: Insufficient documentation

## 2011-10-06 DIAGNOSIS — E119 Type 2 diabetes mellitus without complications: Secondary | ICD-10-CM | POA: Insufficient documentation

## 2011-10-06 DIAGNOSIS — I10 Essential (primary) hypertension: Secondary | ICD-10-CM | POA: Insufficient documentation

## 2011-10-06 DIAGNOSIS — B349 Viral infection, unspecified: Secondary | ICD-10-CM

## 2011-10-06 DIAGNOSIS — Z886 Allergy status to analgesic agent status: Secondary | ICD-10-CM | POA: Insufficient documentation

## 2011-10-06 DIAGNOSIS — E785 Hyperlipidemia, unspecified: Secondary | ICD-10-CM | POA: Insufficient documentation

## 2011-10-06 DIAGNOSIS — J329 Chronic sinusitis, unspecified: Secondary | ICD-10-CM | POA: Insufficient documentation

## 2011-10-06 MED ORDER — BENZONATATE 100 MG PO CAPS: 100.0000 mg | ORAL_CAPSULE | Freq: Three times a day (TID) | ORAL | Status: AC

## 2011-10-06 NOTE — ED Notes (Signed)
runy nose, HA, denies fever, green mucus from nose per patient

## 2011-10-06 NOTE — ED Provider Notes (Signed)
History   This chart was scribed for Cheri Guppy, MD by Charolett Bumpers . The patient was seen in room APA05/[facility]05 and the patient's care was started at 5:04pm.    CSN: [id number]  Arrival date & time 10/06/11  1633   First MD Initiated Contact with Patient 10/06/11 1657      Chief Complaint  Patient presents with  . Sinusitis    (Consider location/radiation/quality/duration/timing/severity/associated sxs/prior treatment) HPI [name] is a 71 y.o. male who has a h/o diabetes mellitus, HTN, and Hyperlipidemia, presents to the Emergency Department complaining of a constant, moderate non-productive cough with associated rhinorrhea, chest pain, diaphoresis, and headache since yesterday. Patient also reports that his ear have felt "stopped up" as well. Patient denies fever or chills. Patient denies smoking.  Patient reports no modifying factors.   PCP: Dr. Leandrew Koyanagi   Past Medical History  Diagnosis Date  . Diabetes mellitus Since 1995  . Hypertension Since 1995  . Hyperlipidemia Since 1995  . Herpes   . Sinus infection     Past Surgical History  Procedure Date  . Back surgery     Lumbar  . Cervical spine surgery   . Rotator cuff repair     Right  . Wrist surgery     Left  . Hand surgery     Right  . Penile prosthesis implant     Family History  Problem Relation Age of Onset  . Heart attack Father 16    MI  . Hypertension Father   . Heart attack Brother 50    MI  . Heart attack Paternal Uncle   . Diabetes Mother     History  Substance Use Topics  . Smoking status: Never Smoker   . Smokeless tobacco: Not on file  . Alcohol Use: Yes     occ. use       Review of Systems A complete 10 system review of systems was obtained and all systems are negative except as noted in the HPI and PMH.   Allergies  Aspirin  Home Medications   Current Outpatient Rx  Name Route Sig Dispense Refill  . AMLODIPINE BESYLATE 2.5 MG PO TABS  TAKE ONE  TABLET BY MOUTH ONCE A DAY 30 tablet 12  . ASPIRIN 81 MG PO TBEC Oral Take 81 mg by mouth daily.      Marland Kitchen METOPROLOL SUCCINATE ER 50 MG PO TB24 Oral Take 50 mg by mouth daily.      Marland Kitchen NAPROXEN 500 MG PO TABS Oral Take 500 mg by mouth 2 (two) times daily as needed.     Marland Kitchen OMEPRAZOLE 40 MG PO CPDR Oral Take 40 mg by mouth daily.      Marland Kitchen RAMIPRIL 5 MG PO CAPS Oral Take 5 mg by mouth 2 (two) times daily.      Marland Kitchen SIMVASTATIN 20 MG PO TABS Oral Take 20 mg by mouth every evening.      BP 118/73  Pulse 69  Temp(Src) 97.8 F (36.6 C) (Oral)  Resp 20  Ht 5\' 10"  (1.778 m)  Wt 155 lb (70.308 kg)  BMI 22.24 kg/m2  SpO2 100%  Physical Exam  Nursing note and vitals reviewed. Constitutional: He is oriented to person, place, and time. He appears well-developed and well-nourished. No distress.  HENT:  Head: Normocephalic and atraumatic.  Right Ear: External ear normal.  Left Ear: External ear normal.  Nose: Nose normal.       TM's normal bilaterally. No  tenderness over frontal sinus. No tenderness over sinuses or lymph nodes.    Eyes: EOM are normal. Pupils are equal, round, and reactive to light.  Neck: Normal range of motion. Neck supple. No tracheal deviation present.  Cardiovascular: Normal rate, regular rhythm and normal heart sounds.  Exam reveals no gallop and no friction rub.   No murmur heard. Pulmonary/Chest: Effort normal and breath sounds normal. No respiratory distress.  Abdominal: Soft. Bowel sounds are normal. He exhibits no distension. There is no tenderness.  Musculoskeletal: Normal range of motion. He exhibits no edema.  Neurological: He is alert and oriented to person, place, and time. No sensory deficit.  Skin: Skin is warm and dry.  Psychiatric: He has a normal mood and affect. His behavior is normal.    ED Course  Procedures (including critical care time) Uri. No tests indicated  DIAGNOSTIC STUDIES: Oxygen Saturation is 100% on room air, normal by my interpretation.     COORDINATION OF CARE:  1709: Discussed planned course of treatment with the patient who is agreeable at this time. Will prescribe an antitussive but no antibiotics.    Labs Reviewed - No data to display No results found.   No diagnosis found.    MDM  uri  I personally performed the services described in this documentation, which was scribed in my presence. The recorded information has been reviewed and considered.        Cheri Guppy, MD 10/06/11 1945

## 2011-10-06 NOTE — Discharge Instructions (Signed)
Use Tessalon for cough.  Use Sudafed for congestion, runny nose, and a pressure in your ears.  Use ibuprofen to reduce inflammation, fever, or pain.  Follow up with your Dr. For reevaluation.  If your symptoms.  Last more than 3-4 days.  Return for worse or uncontrolled symptoms

## 2011-11-14 ENCOUNTER — Other Ambulatory Visit (HOSPITAL_COMMUNITY): Payer: Self-pay | Admitting: Family Medicine

## 2011-11-14 ENCOUNTER — Ambulatory Visit (HOSPITAL_COMMUNITY)
Admission: RE | Admit: 2011-11-14 | Discharge: 2011-11-14 | Disposition: A | Discharge status: Home / Self Care | Payer: Medicare Other | Source: Ambulatory Visit | Attending: Family Medicine | Admitting: Family Medicine

## 2011-11-14 DIAGNOSIS — M545 Low back pain, unspecified: Secondary | ICD-10-CM | POA: Insufficient documentation

## 2011-11-14 DIAGNOSIS — Z981 Arthrodesis status: Secondary | ICD-10-CM

## 2011-12-17 ENCOUNTER — Emergency Department (HOSPITAL_COMMUNITY)
Admission: EM | Admit: 2011-12-17 | Discharge: 2011-12-17 | Disposition: A | Discharge status: Home / Self Care | Payer: Medicare Other | Attending: Emergency Medicine | Admitting: Emergency Medicine

## 2011-12-17 ENCOUNTER — Encounter (HOSPITAL_COMMUNITY): Payer: Self-pay | Admitting: Emergency Medicine

## 2011-12-17 DIAGNOSIS — I10 Essential (primary) hypertension: Secondary | ICD-10-CM | POA: Insufficient documentation

## 2011-12-17 DIAGNOSIS — E119 Type 2 diabetes mellitus without complications: Secondary | ICD-10-CM | POA: Insufficient documentation

## 2011-12-17 DIAGNOSIS — R300 Dysuria: Secondary | ICD-10-CM | POA: Insufficient documentation

## 2011-12-17 DIAGNOSIS — Z7982 Long term (current) use of aspirin: Secondary | ICD-10-CM | POA: Insufficient documentation

## 2011-12-17 DIAGNOSIS — E785 Hyperlipidemia, unspecified: Secondary | ICD-10-CM | POA: Insufficient documentation

## 2011-12-17 DIAGNOSIS — Z79899 Other long term (current) drug therapy: Secondary | ICD-10-CM | POA: Insufficient documentation

## 2011-12-17 LAB — URINALYSIS, ROUTINE W REFLEX MICROSCOPIC
Bilirubin Urine: NEGATIVE
Glucose, UA: NEGATIVE mg/dL
Ketones, ur: NEGATIVE mg/dL
Leukocytes, UA: NEGATIVE
Specific Gravity, Urine: 1.015 (ref 1.005–1.030)
pH: 6 (ref 5.0–8.0)

## 2011-12-17 LAB — URINE MICROSCOPIC-ADD ON

## 2011-12-17 MED ORDER — CEFTRIAXONE SODIUM 250 MG IJ SOLR
250.0000 mg | Freq: Once | INTRAMUSCULAR | Status: AC
  Administered 2011-12-17: 250 mg via INTRAMUSCULAR
  Filled 2011-12-17: qty 250

## 2011-12-17 MED ORDER — AZITHROMYCIN 250 MG PO TABS
1000.0000 mg | ORAL_TABLET | Freq: Once | ORAL | Status: AC
  Administered 2011-12-17: 1000 mg via ORAL
  Filled 2011-12-17: qty 4

## 2011-12-17 MED ORDER — LIDOCAINE HCL (PF) 1 % IJ SOLN
INTRAMUSCULAR | Status: AC
  Administered 2011-12-17: 04:00:00
  Filled 2011-12-17: qty 5

## 2011-12-17 NOTE — ED Notes (Signed)
Patient states his friend told him she has gonorrhea and he needed to be checked. Patient complaining of headache and abdominal pain since yesterday.

## 2011-12-17 NOTE — Discharge Instructions (Signed)
Treated in the emergency department for an STD urinalysis without evidence of urinary tract infection. Followup with your regular Dr. If not better in 2 days.

## 2011-12-17 NOTE — ED Provider Notes (Signed)
History     CSN: [id number]  Arrival date & time 12/17/11  4098   First MD Initiated Contact with Patient 12/17/11 0327      Chief Complaint  Patient presents with  . SEXUALLY TRANSMITTED DISEASE    (Consider location/radiation/quality/duration/timing/severity/associated sxs/prior treatment) The history is provided by the patient.  the patient is a 71 year old male presents with concern of having diarrhea since states he's been exposed to by his lady friend denies penile discharge or lesions on the penis proximal about some vague abdominal pain and a headache not that severe. Main concern is for the STD. Denies dysuria discharge urinary frequency or inability to void.  Past Medical History  Diagnosis Date  . Diabetes mellitus Since 1995  . Hypertension Since 1995  . Hyperlipidemia Since 1995  . Herpes   . Sinus infection     Past Surgical History  Procedure Date  . Back surgery     Lumbar  . Cervical spine surgery   . Rotator cuff repair     Right  . Wrist surgery     Left  . Hand surgery     Right  . Penile prosthesis implant     Family History  Problem Relation Age of Onset  . Heart attack Father 76    MI  . Hypertension Father   . Heart attack Brother 50    MI  . Heart attack Paternal Uncle   . Diabetes Mother     History  Substance Use Topics  . Smoking status: Never Smoker   . Smokeless tobacco: Not on file  . Alcohol Use: Yes     occ. use       Review of Systems  Constitutional: Negative for fever and chills.  HENT: Negative for congestion and neck pain.   Eyes: Negative for visual disturbance.  Respiratory: Negative for shortness of breath.   Cardiovascular: Negative for chest pain.  Gastrointestinal: Positive for abdominal pain. Negative for nausea, vomiting and diarrhea.  Genitourinary: Negative for dysuria, hematuria, discharge, genital sores, penile pain and testicular pain.  Musculoskeletal: Negative for back pain.  Skin: Negative for  rash.  Neurological: Positive for headaches. Negative for weakness and numbness.    Allergies  Aspirin  Home Medications   Current Outpatient Rx  Name Route Sig Dispense Refill  . AMLODIPINE BESYLATE 2.5 MG PO TABS  TAKE ONE TABLET BY MOUTH ONCE A DAY 30 tablet 12  . ASPIRIN 81 MG PO TBEC Oral Take 81 mg by mouth daily.      Marland Kitchen METOPROLOL SUCCINATE ER 50 MG PO TB24 Oral Take 50 mg by mouth daily.      Marland Kitchen NAPROXEN 500 MG PO TABS Oral Take 500 mg by mouth 2 (two) times daily as needed.     Marland Kitchen OMEPRAZOLE 40 MG PO CPDR Oral Take 40 mg by mouth daily.      Marland Kitchen RAMIPRIL 10 MG PO TABS Oral Take 10 mg by mouth daily.    Marland Kitchen SIMVASTATIN 20 MG PO TABS Oral Take 20 mg by mouth every evening.      BP 162/84  Pulse 58  Temp(Src) 97.9 F (36.6 C) (Oral)  Resp 16  Ht 5\' 10"  (1.778 m)  Wt 160 lb (72.576 kg)  BMI 22.96 kg/m2  SpO2 100%  Physical Exam  Nursing note and vitals reviewed. Constitutional: He is oriented to person, place, and time. He appears well-developed and well-nourished. No distress.  HENT:  Head: Normocephalic and atraumatic.  Mouth/Throat: Oropharynx  is clear and moist.  Eyes: Conjunctivae and EOM are normal. Pupils are equal, round, and reactive to light.  Neck: Normal range of motion. Neck supple.  Cardiovascular: Normal rate, regular rhythm and normal heart sounds.   No murmur heard. Pulmonary/Chest: Effort normal and breath sounds normal.  Abdominal: Soft. Bowel sounds are normal. There is no tenderness.  Genitourinary: Penis normal. No penile tenderness.       No penile lesions no urethral discharge testicles are nontender and descended no evidence of hernia.  Musculoskeletal: Normal range of motion. He exhibits no edema and no tenderness.  Lymphadenopathy:    He has no cervical adenopathy.  Neurological: He is alert and oriented to person, place, and time. No cranial nerve deficit. He exhibits normal muscle tone. Coordination normal.  Skin: Skin is warm. No rash  noted.    ED Course  Procedures (including critical care time)  Labs Reviewed  URINALYSIS, ROUTINE W REFLEX MICROSCOPIC - Abnormal; Notable for the following:    Hgb urine dipstick TRACE (*)    All other components within normal limits  URINE MICROSCOPIC-ADD ON   No results found. Results for orders placed during the hospital encounter of 12/17/11  URINALYSIS, ROUTINE W REFLEX MICROSCOPIC      Component Value Range   Color, Urine YELLOW  YELLOW    APPearance CLEAR  CLEAR    Specific Gravity, Urine 1.015  1.005 - 1.030    pH 6.0  5.0 - 8.0    Glucose, UA NEGATIVE  NEGATIVE (mg/dL)   Hgb urine dipstick TRACE (*) NEGATIVE    Bilirubin Urine NEGATIVE  NEGATIVE    Ketones, ur NEGATIVE  NEGATIVE (mg/dL)   Protein, ur NEGATIVE  NEGATIVE (mg/dL)   Urobilinogen, UA 0.2  0.0 - 1.0 (mg/dL)   Nitrite NEGATIVE  NEGATIVE    Leukocytes, UA NEGATIVE  NEGATIVE   URINE MICROSCOPIC-ADD ON      Component Value Range   Squamous Epithelial / LPF RARE  RARE    WBC, UA 0-2  <3 (WBC/hpf)   RBC / HPF 0-2  <3 (RBC/hpf)   Bacteria, UA RARE  RARE      1. Dysuria       MDM  Patient is very concerned about having his STD he states that his lady friend has on examination no evidence of discharge no evidence of lesions on penis no adenopathy. Urinalysis also negative for urinary tract and therefore also probably does not have prostatitis. Treated in the emergency department with 250 mg Rocephin and 1 g of Zithromax to cover for STD.  Patient other 2 complaints mild headache and some abdominal discomfort not concerned for any acute abdominal process can followup with his primary care Dr. Not concerned about any significant intracranial process ongoing at this time.       Shelda Jakes, MD 12/17/11 [phone]

## 2012-01-17 ENCOUNTER — Encounter (INDEPENDENT_AMBULATORY_CARE_PROVIDER_SITE_OTHER): Payer: Self-pay | Admitting: *Deleted

## 2012-01-25 ENCOUNTER — Emergency Department (HOSPITAL_COMMUNITY): Payer: Medicare Other

## 2012-01-25 ENCOUNTER — Emergency Department (HOSPITAL_COMMUNITY)
Admission: EM | Admit: 2012-01-25 | Discharge: 2012-01-25 | Disposition: A | Discharge status: Home / Self Care | Payer: Medicare Other | Attending: Emergency Medicine | Admitting: Emergency Medicine

## 2012-01-25 ENCOUNTER — Encounter (HOSPITAL_COMMUNITY): Payer: Self-pay

## 2012-01-25 DIAGNOSIS — E119 Type 2 diabetes mellitus without complications: Secondary | ICD-10-CM | POA: Insufficient documentation

## 2012-01-25 DIAGNOSIS — T148XXA Other injury of unspecified body region, initial encounter: Secondary | ICD-10-CM

## 2012-01-25 DIAGNOSIS — IMO0002 Reserved for concepts with insufficient information to code with codable children: Secondary | ICD-10-CM | POA: Insufficient documentation

## 2012-01-25 DIAGNOSIS — I10 Essential (primary) hypertension: Secondary | ICD-10-CM | POA: Insufficient documentation

## 2012-01-25 DIAGNOSIS — Y998 Other external cause status: Secondary | ICD-10-CM | POA: Insufficient documentation

## 2012-01-25 DIAGNOSIS — Y9301 Activity, walking, marching and hiking: Secondary | ICD-10-CM | POA: Insufficient documentation

## 2012-01-25 DIAGNOSIS — E785 Hyperlipidemia, unspecified: Secondary | ICD-10-CM | POA: Insufficient documentation

## 2012-01-25 DIAGNOSIS — R1031 Right lower quadrant pain: Secondary | ICD-10-CM

## 2012-01-25 DIAGNOSIS — X58XXXA Exposure to other specified factors, initial encounter: Secondary | ICD-10-CM | POA: Insufficient documentation

## 2012-01-25 LAB — CBC WITH DIFFERENTIAL/PLATELET
Basophils Absolute: 0 10*3/uL (ref 0.0–0.1)
Basophils Relative: 0 % (ref 0–1)
Eosinophils Absolute: 0 10*3/uL (ref 0.0–0.7)
Hemoglobin: 11.3 g/dL — ABNORMAL LOW (ref 13.0–17.0)
MCHC: 35.3 g/dL (ref 30.0–36.0)
Monocytes Relative: 4 % (ref 3–12)
Neutro Abs: 5.3 10*3/uL (ref 1.7–7.7)
Neutrophils Relative %: 83 % — ABNORMAL HIGH (ref 43–77)
Platelets: 164 10*3/uL (ref 150–400)

## 2012-01-25 LAB — BASIC METABOLIC PANEL
BUN: 10 mg/dL (ref 6–23)
Chloride: 103 mEq/L (ref 96–112)
GFR calc Af Amer: >90 mL/min (ref >90)
GFR calc non Af Amer: 84 mL/min — ABNORMAL LOW (ref >90)
Potassium: 4.2 mEq/L (ref 3.5–5.1)
Sodium: 138 mEq/L (ref 135–145)

## 2012-01-25 MED ORDER — ONDANSETRON HCL 4 MG/2ML IJ SOLN
4.0000 mg | Freq: Once | INTRAMUSCULAR | Status: AC
  Administered 2012-01-25: 4 mg via INTRAVENOUS
  Filled 2012-01-25: qty 2

## 2012-01-25 MED ORDER — CYCLOBENZAPRINE HCL 10 MG PO TABS: 10.0000 mg | ORAL_TABLET | Freq: Two times a day (BID) | ORAL | Status: AC | PRN

## 2012-01-25 MED ORDER — HYDROMORPHONE HCL PF 1 MG/ML IJ SOLN
1.0000 mg | Freq: Once | INTRAMUSCULAR | Status: AC
  Administered 2012-01-25: 1 mg via INTRAVENOUS
  Filled 2012-01-25: qty 1

## 2012-01-25 MED ORDER — SODIUM CHLORIDE 0.9 % IV BOLUS (SEPSIS)
250.0000 mL | Freq: Once | INTRAVENOUS | Status: AC
  Administered 2012-01-25: 250 mL via INTRAVENOUS

## 2012-01-25 MED ORDER — IOHEXOL 300 MG/ML  SOLN
100.0000 mL | Freq: Once | INTRAMUSCULAR | Status: AC | PRN
  Administered 2012-01-25: 100 mL via INTRAVENOUS

## 2012-01-25 MED ORDER — SODIUM CHLORIDE 0.9 % IV SOLN
INTRAVENOUS | Status: DC
  Administered 2012-01-25: 09:00:00 via INTRAVENOUS

## 2012-01-25 MED ORDER — HYDROCODONE-ACETAMINOPHEN 5-325 MG PO TABS
1.0000 | ORAL_TABLET | Freq: Four times a day (QID) | ORAL | Status: AC | PRN
Start: 2012-01-25 — End: 2012-02-04

## 2012-01-25 NOTE — ED Provider Notes (Signed)
History  This chart was scribed for Barry Jakes, MD by Bennett Scrape. This patient was seen in room APA15/[facility]15 and the patient's care was started at 7:58AM.  CSN: [id number]  Arrival date & time 01/25/12  4098   First MD Initiated Contact with Patient 01/25/12 [phone]      Chief Complaint  Patient presents with  . Muscle Pain    Patient is a 71 y.o. male presenting with musculoskeletal pain. The history is provided by the patient. No language interpreter was used.  Muscle Pain This is a new problem. The current episode started 3 to 5 hours ago. The problem occurs constantly. The problem has been gradually worsening. Associated symptoms include abdominal pain. Pertinent negatives include no chest pain, no headaches and no shortness of breath. The symptoms are aggravated by walking. Nothing relieves the symptoms. He has tried nothing for the symptoms.    [name] is a 71 y.o. male who presents to the Emergency Department complaining of 4 hours of sudden onset, constant right groin pain described as a muscle strain that occurred when he stepped up onto sidewalk.  The pain is worse with walking and movement. Nothing relives the symptoms.  He denies taking any OTC medication to improve pain PTA.  He reports experiencing prior episodes of similar less severe pain. He also c/o epigastric abdominal pain that he attributes to being hungry. He denies fever, neck pain, visual disturbance, CP, cough, nausea, emesis, diarrhea, urinary symptoms, back pain, HA and rash as associated symptoms. He has a h/o DM, HTN, HLD and herpes. He is an occasional alcohol user but denies smoking.   PCP is Dr. Leandrew Koyanagi in [place].  Past Medical History  Diagnosis Date  . Diabetes mellitus Since 1995  . Hypertension Since 1995  . Hyperlipidemia Since 1995  . Herpes   . Sinus infection     Past Surgical History  Procedure Date  . Back surgery     Lumbar  . Cervical spine surgery   . Rotator cuff  repair     Right  . Wrist surgery     Left  . Hand surgery     Right  . Penile prosthesis implant     Family History  Problem Relation Age of Onset  . Heart attack Father 49    MI  . Hypertension Father   . Heart attack Brother 50    MI  . Heart attack Paternal Uncle   . Diabetes Mother     History  Substance Use Topics  . Smoking status: Never Smoker   . Smokeless tobacco: Not on file  . Alcohol Use: Yes     occ. use       Review of Systems  Constitutional: Negative for fever and chills.  HENT: Negative for congestion, sore throat and neck pain.   Eyes: Negative for visual disturbance.  Respiratory: Negative for cough and shortness of breath.   Cardiovascular: Negative for chest pain.  Gastrointestinal: Positive for abdominal pain. Negative for nausea, vomiting and diarrhea.  Genitourinary: Negative for dysuria.  Musculoskeletal: Negative for back pain.       Right groin pain  Skin: Negative for rash.  Neurological: Negative for headaches.  Hematological: Does not bruise/bleed easily.  Psychiatric/Behavioral: Negative for confusion.    Allergies  Aspirin  Home Medications   Current Outpatient Rx  Name Route Sig Dispense Refill  . AMLODIPINE BESYLATE 2.5 MG PO TABS  TAKE ONE TABLET BY MOUTH ONCE A DAY 30  tablet 12  . ASPIRIN 81 MG PO TBEC Oral Take 81 mg by mouth daily.      . CYCLOBENZAPRINE HCL 10 MG PO TABS Oral Take 1 tablet (10 mg total) by mouth 2 (two) times daily as needed for muscle spasms. 20 tablet 0  . HYDROCODONE-ACETAMINOPHEN 5-325 MG PO TABS Oral Take 1-2 tablets by mouth every 6 (six) hours as needed for pain. 20 tablet 0  . METOPROLOL SUCCINATE ER 50 MG PO TB24 Oral Take 50 mg by mouth daily.      Marland Kitchen NAPROXEN 500 MG PO TABS Oral Take 500 mg by mouth 2 (two) times daily as needed.     Marland Kitchen OMEPRAZOLE 40 MG PO CPDR Oral Take 40 mg by mouth daily.      Marland Kitchen RAMIPRIL 10 MG PO TABS Oral Take 10 mg by mouth daily.    Marland Kitchen SIMVASTATIN 20 MG PO TABS Oral  Take 20 mg by mouth every evening.      Triage vitals: BP 139/74  Pulse 64  Temp 98.3 F (36.8 C) (Oral)  Resp 18  Ht 5\' 10"  (1.778 m)  Wt 160 lb (72.576 kg)  BMI 22.96 kg/m2  SpO2 99%  Physical Exam  Nursing note and vitals reviewed. Constitutional: He is oriented to person, place, and time. He appears well-developed and well-nourished. No distress.  HENT:  Head: Normocephalic and atraumatic.  Eyes: Conjunctivae and EOM are normal.  Neck: Neck supple. No tracheal deviation present.  Cardiovascular: Normal rate, regular rhythm and normal heart sounds.   No murmur heard.      Strong DP pulse.  Pulmonary/Chest: Effort normal and breath sounds normal. No respiratory distress.  Abdominal: Soft. Bowel sounds are normal. There is no tenderness.  Musculoskeletal: Normal range of motion. He exhibits no edema.       Legs:      Left groin is normal  Neurological: He is alert and oriented to person, place, and time.  Skin: Skin is warm and dry.  Psychiatric: He has a normal mood and affect. His behavior is normal.    ED Course  Procedures (including critical care time)  DIAGNOSTIC STUDIES: Oxygen Saturation is 99% on room air, normal by my interpretation.    COORDINATION OF CARE: 8:26AM-Discussed treatment plan which includes a CT scan with pt at bedside and pt agreed to plan.   Labs Reviewed  CBC WITH DIFFERENTIAL - Abnormal; Notable for the following:    RBC 3.52 (*)     Hemoglobin 11.3 (*)     HCT 32.0 (*)     Neutrophils Relative 83 (*)     All other components within normal limits  BASIC METABOLIC PANEL - Abnormal; Notable for the following:    Glucose, Bld 214 (*)     GFR calc non Af Amer 84 (*)     All other components within normal limits   Ct Abdomen Pelvis W Contrast  01/25/2012  **ADDENDUM** CREATED: 01/25/2012 10:55:17  The original report was by Dr. Gaylyn Rong.  The following addendum is by Dr. Gaylyn Rong:  Abnormal thickening/expansion of the  right hip adductor musculature is observed, compatible with hematoma involving the right hip adductor musculature.  This extends beyond the inferior margin of today's exam.  Dr. Deretha Emory is aware of this finding.  **END ADDENDUM** SIGNED BY: Soyla Murphy. Ova Freshwater, [name]   01/25/2012  *RADIOLOGY REPORT*  Clinical Data: Right lower quadrant abdominal pain.  Diabetes.  CT ABDOMEN AND PELVIS WITH CONTRAST  Technique:  Multidetector CT imaging of the abdomen and pelvis was performed following the standard protocol during bolus administration of intravenous contrast.  Contrast: OMNIPAQUE IOHEXOL 300 MG/ML  SOLN  Comparison: 08/21/2011  Findings: Bilateral cylindrical bronchiectasis in the lower lobes noted, without current airway thickening or plugging.  Mild stable capsular calcification noted along the posterolateral spleen.  The liver and adrenal glands appear unremarkable.  The dorsal pancreatic duct is borderline dilated in the mid body and head of the pancreas, without specific findings of pancreas divisum.  Gastric antrum appears thickened, but this is probably from peristalsis.  The remainder of the stomach is distended.  Small hypodense lesion of the right mid kidney posteriorly is statistically likely to be a cyst but technically too small to characterize due to small size (3 x 7 mm).  Unchanged from prior.  No pathologic retroperitoneal or porta hepatis adenopathy is identified.  No pathologic pelvic adenopathy is identified.  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  Contrast in the urinary bladder is slightly denser than the fluid in the reservoir, probably incidental or due to a test injection of contrast - correlate with urine analysis  Lumbar spondylosis with multilevel Schmorl's nodes and spurring in the lower lumbar spine.  IMPRESSION:  1.  A cause for the patient's right lower quadrant pain is not readily apparent.  The appendix appears normal. 2.   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 3.  Chronic borderline dilated dorsal pancreatic duct. 4.  Chronic mild capsular calcification along the portion of the spleen. 5.  Urinary bladder fluid is slightly more dense than fluid in the penile prosthesis reservoir.  This may be incidental but could indicate complexity of urine - consider urine analysis. 6.  Lumbar spondylosis.  Original Report Authenticated By: Dellia Cloud, [name]   Results for orders placed during the hospital encounter of 01/25/12  CBC WITH DIFFERENTIAL      Component Value Range   WBC 6.4  4.0 - 10.5 K/uL   RBC 3.52 (*) 4.22 - 5.81 MIL/uL   Hemoglobin 11.3 (*) 13.0 - 17.0 g/dL   HCT 09.8 (*) 11.9 - 14.7 %   MCV 90.9  78.0 - 100.0 fL   MCH 32.1  26.0 - 34.0 pg   MCHC 35.3  30.0 - 36.0 g/dL   RDW 82.9  56.2 - 13.0 %   Platelets 164  150 - 400 K/uL   Neutrophils Relative 83 (*) 43 - 77 %   Neutro Abs 5.3  1.7 - 7.7 K/uL   Lymphocytes Relative 12  12 - 46 %   Lymphs Abs 0.8  0.7 - 4.0 K/uL   Monocytes Relative 4  3 - 12 %   Monocytes Absolute 0.3  0.1 - 1.0 K/uL   Eosinophils Relative 0  0 - 5 %   Eosinophils Absolute 0.0  0.0 - 0.7 K/uL   Basophils Relative 0  0 - 1 %   Basophils Absolute 0.0  0.0 - 0.1 K/uL  BASIC METABOLIC PANEL      Component Value Range   Sodium 138  135 - 145 mEq/L   Potassium 4.2  3.5 - 5.1 mEq/L   Chloride 103  96 - 112 mEq/L   CO2 29  19 - 32 mEq/L   Glucose, Bld 214 (*) 70 - 99 mg/dL   BUN 10  6 - 23 mg/dL   Creatinine, Ser 8.65  0.50 - 1.35 mg/dL  Calcium 9.4  8.4 - 10.5 mg/dL   GFR calc non Af Amer 84 (*) >90 mL/min   GFR calc Af Amer >90  >90 mL/min     1. Right groin pain   2. Muscle strain       MDM  CT scan is consistent with a muscle pull and hematoma in the right groin area also along with the patient's clinical exam and history. Will refer to orthopedics for followup we'll treat that with pain medicine and muscle relaxer.      I  personally performed the services described in this documentation, which was scribed in my presence. The recorded information has been reviewed and considered.     Barry Jakes, MD 01/25/12 [phone]

## 2012-01-25 NOTE — ED Notes (Signed)
Patient states he does not need anything at this time. 

## 2012-01-25 NOTE — ED Notes (Addendum)
Started an iv and medicated the pt. Pt has finished drinking contrast for ct.

## 2012-01-25 NOTE — ED Notes (Signed)
Complain of pain in right froin. States he thinks he pulled a muscle

## 2012-01-25 NOTE — ED Notes (Signed)
Pt c/o rt sided groin pain after getting out of the car and stepping on the curb this am around 4am. Pt states he can feel a knot on the rt side.

## 2012-02-04 ENCOUNTER — Ambulatory Visit (INDEPENDENT_AMBULATORY_CARE_PROVIDER_SITE_OTHER): Payer: Medicare Other | Admitting: Internal Medicine

## 2012-02-04 ENCOUNTER — Encounter (INDEPENDENT_AMBULATORY_CARE_PROVIDER_SITE_OTHER): Payer: Self-pay | Admitting: Internal Medicine

## 2012-02-04 VITALS — BP 110/70 | HR 60 | Temp 98.0°F | Ht 71.0 in | Wt 155.3 lb

## 2012-02-04 DIAGNOSIS — K219 Gastro-esophageal reflux disease without esophagitis: Secondary | ICD-10-CM

## 2012-02-04 NOTE — Patient Instructions (Addendum)
Continue Prilosec. OV  In 6 months.

## 2012-02-04 NOTE — Progress Notes (Signed)
Subjective:     Patient ID: [name], male   DOB: 10/28/1940, 71 y.o.   MRN: [id number]  HPI Presents today for f/u of his GERD. He says his acid reflux is controlled. There is no abdominal pain.  Appetite is good. No weight loss.  He has a BM about once every other day. No bright red rectal bleeding or melena. 08/08/2010 EGD:  No evidence of erosive or ulcerative esophagitis. 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 Esophagus dilated. Hx of H pylori with tx.  .  Repeat US in November of 2012 MPRESSION: Small echogenic nonshadowing foci are seen within the  gallbladder. These could reflect small sludge balls but I cannot  exclude very tiny calculi. No gallbladder wall thickening or  pericholecystic fluid. The gallbladder wall thickness measured 2.2  mm. No sonographic Murphy's sign according to the ultrasound  technologist.  Dilated in caliber measuring 9 mm. On previous study it measured 9  mm in greatest diameter. Upper limits of normal for age is 7 mm.  No choledocholithiasis is evident. No definite dilatation of  branching intrahepatic bile ducts.    His liver enzymes have been normal.      Review of Systems see hpi Current Outpatient Prescriptions  Medication Sig Dispense Refill  . aspirin 81 MG EC tablet Take 81 mg by mouth daily.        . cyclobenzaprine (FLEXERIL) 10 MG tablet Take 1 tablet (10 mg total) by mouth 2 (two) times daily as needed for muscle spasms.  20 tablet  0  . glyBURIDE-metformin (GLUCOVANCE) 5-500 MG per tablet Take 2 tablets by mouth 2 (two) times daily with a meal.      . HYDROcodone-acetaminophen (NORCO/VICODIN) 5-325 MG per tablet Take 1-2 tablets by mouth every 6 (six) hours as needed for pain.  20 tablet  0  . metoprolol (TOPROL-XL) 50 MG 24 hr tablet Take 50 mg by mouth daily.        . naproxen (NAPROSYN) 500 MG tablet Take 500 mg by mouth 2 (two) times daily as needed.       Marland Kitchen omeprazole  (PRILOSEC) 40 MG capsule Take 40 mg by mouth daily.        . ramipril (ALTACE) 10 MG tablet Take 10 mg by mouth daily.      . simvastatin (ZOCOR) 20 MG tablet Take 20 mg by mouth every evening.      Marland Kitchen DISCONTD: bismuth-metronidazole-tetracycline (PLYERA) 140-125-125 MG per capsule Take 3 capsules by mouth 4 (four) times daily -  before meals and at bedtime.       Current Outpatient Prescriptions on File Prior to Visit  Medication Sig Dispense Refill  . aspirin 81 MG EC tablet Take 81 mg by mouth daily.        . cyclobenzaprine (FLEXERIL) 10 MG tablet Take 1 tablet (10 mg total) by mouth 2 (two) times daily as needed for muscle spasms.  20 tablet  0  . glyBURIDE-metformin (GLUCOVANCE) 5-500 MG per tablet Take 2 tablets by mouth 2 (two) times daily with a meal.      . HYDROcodone-acetaminophen (NORCO/VICODIN) 5-325 MG per tablet Take 1-2 tablets by mouth every 6 (six) hours as needed for pain.  20 tablet  0  . metoprolol (TOPROL-XL) 50 MG 24 hr tablet Take 50 mg by mouth daily.        . naproxen (NAPROSYN) 500 MG tablet Take 500 mg by mouth 2 (two)  times daily as needed.       Marland Kitchen omeprazole (PRILOSEC) 40 MG capsule Take 40 mg by mouth daily.        . ramipril (ALTACE) 10 MG tablet Take 10 mg by mouth daily.      . simvastatin (ZOCOR) 20 MG tablet Take 20 mg by mouth every evening.      Marland Kitchen DISCONTD: bismuth-metronidazole-tetracycline (PLYERA) 140-125-125 MG per capsule Take 3 capsules by mouth 4 (four) times daily -  before meals and at bedtime.       Family Status  Relation Status Death Age  . Father Deceased   . Brother Other     2 deaced. ? cancer. Others in pretty good health.  . Mother Deceased   . Sister Alive     One is a diabetic. Others in good health.   History   Social History  . Marital Status: Divorced    Spouse Name: N/A    Number of Children: 8  . Years of Education: N/A   Occupational History  . 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  Smoking status: Never Smoker   . Smokeless tobacco: Not on file  . Alcohol Use: Yes     occ. use   . Drug Use: Yes    Special: Marijuana     once a month  . Sexually Active:    Other Topics Concern  . Not on file   Social History Narrative  . No narrative on file   Allergies  Allergen Reactions  . Aspirin     REACTION: higher dosage upsets stomach        Objective:   Physical Exam Filed Vitals:   02/04/12 1034  Height: 5\' 11"  (1.803 m)  Weight: 155 lb 4.8 oz (70.444 kg)   Alert and oriented. Skin warm and dry. Oral mucosa is moist.   . Sclera anicteric, conjunctivae is pink. Thyroid not enlarged. No cervical lymphadenopathy. Lungs clear. Heart regular rate and rhythm.  Abdomen is soft. Bowel sounds are positive. No hepatomegaly. No abdominal masses felt. No tenderness.  No edema to lower extremities. Patient is alert and oriented.     Plan:    GERD controlled at this time.  Continue Prolosec. OV 6 months.

## 2012-02-06 ENCOUNTER — Other Ambulatory Visit (HOSPITAL_COMMUNITY): Payer: Self-pay | Admitting: Family Medicine

## 2012-02-06 DIAGNOSIS — M545 Low back pain: Secondary | ICD-10-CM

## 2012-02-08 ENCOUNTER — Ambulatory Visit (HOSPITAL_COMMUNITY)
Admission: RE | Admit: 2012-02-08 | Discharge: 2012-02-08 | Disposition: A | Discharge status: Home / Self Care | Payer: Medicare Other | Source: Ambulatory Visit | Attending: Family Medicine | Admitting: Family Medicine

## 2012-02-08 DIAGNOSIS — M5126 Other intervertebral disc displacement, lumbar region: Secondary | ICD-10-CM | POA: Insufficient documentation

## 2012-02-08 DIAGNOSIS — M545 Low back pain, unspecified: Secondary | ICD-10-CM | POA: Insufficient documentation

## 2012-02-08 DIAGNOSIS — M5137 Other intervertebral disc degeneration, lumbosacral region: Secondary | ICD-10-CM | POA: Insufficient documentation

## 2012-02-08 DIAGNOSIS — M51379 Other intervertebral disc degeneration, lumbosacral region without mention of lumbar back pain or lower extremity pain: Secondary | ICD-10-CM | POA: Insufficient documentation

## 2012-02-14 ENCOUNTER — Ambulatory Visit (INDEPENDENT_AMBULATORY_CARE_PROVIDER_SITE_OTHER): Payer: Medicare Other | Admitting: Internal Medicine

## 2012-02-18 ENCOUNTER — Encounter (INDEPENDENT_AMBULATORY_CARE_PROVIDER_SITE_OTHER): Payer: Self-pay | Admitting: Internal Medicine

## 2012-02-18 ENCOUNTER — Ambulatory Visit (INDEPENDENT_AMBULATORY_CARE_PROVIDER_SITE_OTHER): Payer: Medicare Other | Admitting: Internal Medicine

## 2012-02-18 VITALS — BP 104/64 | HR 80 | Temp 97.8°F | Ht 70.0 in | Wt 152.3 lb

## 2012-02-18 DIAGNOSIS — K838 Other specified diseases of biliary tract: Secondary | ICD-10-CM

## 2012-02-18 DIAGNOSIS — K219 Gastro-esophageal reflux disease without esophagitis: Secondary | ICD-10-CM

## 2012-02-18 MED ORDER — OMEPRAZOLE 40 MG PO CPDR: 40.0000 mg | DELAYED_RELEASE_CAPSULE | Freq: Every day | ORAL | Status: DC

## 2012-02-18 NOTE — Patient Instructions (Addendum)
Sampples of prilosec 20mg  today. Rx to pharmacy.

## 2012-02-18 NOTE — Progress Notes (Signed)
Subjective:     Patient ID: [name], male   DOB: Apr 08, 1941, 71 y.o.   MRN: [id number]  HPIChester is a 71 yr old male presenting today with c/o epigastric pain x 1 week. He says the pain comes and goes. There is no pain now. He has been out of his Omeprazole for about a month Appetite is good.  He has lost 4 pounds since his last visit in March. He usually ha a BM about 1 a day. No melena or bright red rectal bleeding. EGD 09/26/2009 Dr. 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 Hx of dilated CBD at 9mm on [place] with normal liver enzymes.  Review of Systems see hpi Current Outpatient Prescriptions  Medication Sig Dispense Refill  . aspirin 81 MG EC tablet Take 81 mg by mouth daily.        Marland Kitchen glyBURIDE-metformin (GLUCOVANCE) 5-500 MG per tablet Take 2 tablets by mouth 2 (two) times daily with a meal.      . metoprolol (TOPROL-XL) 50 MG 24 hr tablet Take 50 mg by mouth daily.        . naproxen (NAPROSYN) 500 MG tablet Take 500 mg by mouth 2 (two) times daily as needed.       Marland Kitchen omeprazole (PRILOSEC) 40 MG capsule Take 40 mg by mouth daily.        . ramipril (ALTACE) 10 MG tablet Take 10 mg by mouth daily.      . simvastatin (ZOCOR) 20 MG tablet Take 20 mg by mouth every evening.      Marland Kitchen DISCONTD: bismuth-metronidazole-tetracycline (PLYERA) 140-125-125 MG per capsule Take 3 capsules by mouth 4 (four) times daily -  before meals and at bedtime.       Past Medical History  Diagnosis Date  . Diabetes mellitus Since 1995  . Hypertension Since 1995  . Hyperlipidemia Since 1995  . Herpes   . Sinus infection    Past Surgical History  Procedure Date  . Back surgery     Lumbar  . Cervical spine surgery   . Rotator cuff repair     Right  . Wrist surgery     Left  . Hand surgery     Right  . Penile prosthesis implant    History   Social History  . Marital Status: Divorced    Spouse Name: N/A    Number  of Children: 8  . Years of Education: N/A   Occupational History  . 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 Smoking status: Never Smoker   . Smokeless tobacco: Not on file  . Alcohol Use: Yes     occ. use   . Drug Use: Yes    Special: Marijuana     once a month. Smokes marijuana every week if he has 5 dollars.  . Sexually Active: Not on file   Other Topics Concern  . Not on file   Social History Narrative  . No narrative on file   Family Status  Relation Status Death Age  . Father Deceased   . Brother Other     2 deaced. ? cancer. Others in pretty good health.  . Mother Deceased   . Sister Alive     One is a diabetic. Others in good health.   Allergies  Allergen Reactions  . Aspirin     REACTION: higher dosage  upsets stomach         Objective:   Physical Exam Filed Vitals:   02/18/12 1447  Height: 5\' 10"  (1.778 m)  Weight: 152 lb 4.8 oz (69.083 kg)   Alert and oriented. Skin warm and dry. Oral mucosa is moist.   . Sclera anicteric, conjunctivae is pink. Thyroid not enlarged. No cervical lymphadenopathy. Lungs clear. Heart regular rate and rhythm.  Abdomen is soft. Bowel sounds are positive. No hepatomegaly. No abdominal masses felt. No tenderness.  No edema to lower extremities      Assessment:   GERD not controlled at this time. He has not taking his Omeprazole in over a month. Hx of dilated CBD at 9mm asymptomatic    Plan:    Samples of Prilosec given to patient. OV in three month. PR in 2 weeks. Hepatic function today.

## 2012-02-19 LAB — HEPATIC FUNCTION PANEL
Alkaline Phosphatase: 55 U/L (ref 39–117)
Bilirubin, Direct: 0.1 mg/dL (ref 0.0–0.3)
Indirect Bilirubin: 0.2 mg/dL (ref 0.0–0.9)
Total Bilirubin: 0.3 mg/dL (ref 0.3–1.2)

## 2012-03-02 ENCOUNTER — Encounter (HOSPITAL_COMMUNITY): Payer: Self-pay | Admitting: Emergency Medicine

## 2012-03-02 ENCOUNTER — Emergency Department (HOSPITAL_COMMUNITY)
Admission: EM | Admit: 2012-03-02 | Discharge: 2012-03-02 | Disposition: A | Discharge status: Home / Self Care | Payer: Medicare Other | Attending: Emergency Medicine | Admitting: Emergency Medicine

## 2012-03-02 ENCOUNTER — Emergency Department (HOSPITAL_COMMUNITY): Payer: Medicare Other

## 2012-03-02 DIAGNOSIS — J029 Acute pharyngitis, unspecified: Secondary | ICD-10-CM

## 2012-03-02 DIAGNOSIS — E119 Type 2 diabetes mellitus without complications: Secondary | ICD-10-CM | POA: Insufficient documentation

## 2012-03-02 DIAGNOSIS — E785 Hyperlipidemia, unspecified: Secondary | ICD-10-CM | POA: Insufficient documentation

## 2012-03-02 DIAGNOSIS — I10 Essential (primary) hypertension: Secondary | ICD-10-CM | POA: Insufficient documentation

## 2012-03-02 DIAGNOSIS — R05 Cough: Secondary | ICD-10-CM

## 2012-03-02 NOTE — ED Provider Notes (Signed)
History     CSN: [id number]  Arrival date & time 03/02/12  0820   First MD Initiated Contact with Patient 03/02/12 0827      Chief Complaint  Patient presents with  . Sore Throat    (Consider location/radiation/quality/duration/timing/severity/associated sxs/prior treatment) HPI Comments: Sore throat x 3 days.  Cough prod of greenish phlegm.  Coughing makes throat pain worse.  Notes occasional blood streaking.  No fever or chills.  Non-smoker.  Dr. Janna Arch is PCP.  Patient is a 71 y.o. male presenting with pharyngitis. The history is provided by the patient. No language interpreter was used.  Sore Throat This is a new problem. Episode onset: 3 days ago. The problem occurs constantly. The problem has been unchanged. Associated symptoms include coughing and a sore throat. Pertinent negatives include no chest pain, chills, diaphoresis, fever, headaches, myalgias, nausea, rash, swollen glands or vomiting. The symptoms are aggravated by coughing. He has tried nothing for the symptoms.    Past Medical History  Diagnosis Date  . Diabetes mellitus Since 1995  . Hypertension Since 1995  . Hyperlipidemia Since 1995  . Herpes   . Sinus infection     Past Surgical History  Procedure Date  . Back surgery     Lumbar  . Cervical spine surgery   . Rotator cuff repair     Right  . Wrist surgery     Left  . Hand surgery     Right  . Penile prosthesis implant     Family History  Problem Relation Age of Onset  . Heart attack Father 1    MI  . Hypertension Father   . Heart attack Brother 50    MI  . Heart attack Paternal Uncle   . Diabetes Mother     History  Substance Use Topics  . Smoking status: Never Smoker   . Smokeless tobacco: Not on file  . Alcohol Use: Yes     occ. use       Review of Systems  Constitutional: Negative for fever, chills and diaphoresis.  HENT: Positive for sore throat.   Respiratory: Positive for cough. Negative for shortness of breath.     Cardiovascular: Negative for chest pain.  Gastrointestinal: Negative for nausea and vomiting.  Musculoskeletal: Negative for myalgias.  Skin: Negative for rash.  Neurological: Negative for headaches.  All other systems reviewed and are negative.    Allergies  Aspirin  Home Medications   Current Outpatient Rx  Name Route Sig Dispense Refill  . ASPIRIN 81 MG PO TBEC Oral Take 81 mg by mouth daily.      . GLYBURIDE-METFORMIN 5-500 MG PO TABS Oral Take 2 tablets by mouth 2 (two) times daily with a meal.    . METOPROLOL SUCCINATE ER 50 MG PO TB24 Oral Take 50 mg by mouth daily.      Marland Kitchen NAPROXEN 500 MG PO TABS Oral Take 500 mg by mouth 2 (two) times daily as needed.     Marland Kitchen OMEPRAZOLE 40 MG PO CPDR Oral Take 1 capsule (40 mg total) by mouth daily. 30 capsule 5  . RAMIPRIL 10 MG PO TABS Oral Take 10 mg by mouth daily.    Marland Kitchen SIMVASTATIN 20 MG PO TABS Oral Take 20 mg by mouth every evening.      BP 169/81  Pulse 95  Temp 98.2 F (36.8 C) (Oral)  Resp 17  Ht 5\' 10"  (1.778 m)  Wt 159 lb (72.122 kg)  BMI 22.81 kg/m2  SpO2 100%  Physical Exam  Nursing note and vitals reviewed. Constitutional: He is oriented to person, place, and time. He appears well-developed and well-nourished.  HENT:  Head: Normocephalic and atraumatic. No trismus in the jaw.  Mouth/Throat: Uvula is midline and mucous membranes are normal. No dental abscesses or uvula swelling. Posterior oropharyngeal erythema present. No oropharyngeal exudate, posterior oropharyngeal edema or tonsillar abscesses.  Eyes: EOM are normal.  Neck: Normal range of motion.  Cardiovascular: Normal rate, regular rhythm, normal heart sounds and intact distal pulses.   Pulmonary/Chest: Breath sounds normal. No accessory muscle usage. Not tachypneic. No respiratory distress. He has no wheezes. He has no rhonchi. He has no rales. He exhibits no tenderness.  Abdominal: Soft. He exhibits no distension. There is no tenderness.  Musculoskeletal:  Normal range of motion.  Neurological: He is alert and oriented to person, place, and time.  Skin: Skin is warm and dry.  Psychiatric: He has a normal mood and affect. Judgment normal.    ED Course  Procedures (including critical care time)   Labs Reviewed  RAPID STREP SCREEN   Dg Chest 2 View  03/02/2012  *RADIOLOGY REPORT*  Clinical Data: Productive cough, sore throat  CHEST - 2 VIEW  Comparison: CT chest dated 09/03/2011  Findings: Lungs are clear.  No pleural effusion or pneumothorax.  Cardiomediastinal silhouette is within normal limits.  Mild degenerative changes of the visualized thoracolumbar spine.  IMPRESSION: No evidence of acute cardiopulmonary disease.   Original Report Authenticated By: Charline Bills, [name]      1. Pharyngitis   2. Cough       MDM  tyleno or ibuprofen Salt water gargles Chloraseptic F/u with dr. Janna Arch prn        Evalina Field, PA 03/02/12 [phone]

## 2012-03-02 NOTE — ED Provider Notes (Signed)
Medical screening examination/treatment/procedure(s) were performed by non-physician practitioner and as supervising physician I was immediately available for consultation/collaboration.  Raeford Razor, MD 03/02/12 1459

## 2012-03-02 NOTE — ED Notes (Signed)
Patient with c/o sore throat and cough since Thursday.

## 2012-04-15 ENCOUNTER — Emergency Department (HOSPITAL_COMMUNITY): Payer: Medicare Other

## 2012-04-15 ENCOUNTER — Encounter (HOSPITAL_COMMUNITY): Payer: Self-pay | Admitting: Emergency Medicine

## 2012-04-15 ENCOUNTER — Emergency Department (HOSPITAL_COMMUNITY)
Admission: EM | Admit: 2012-04-15 | Discharge: 2012-04-15 | Disposition: A | Discharge status: Home / Self Care | Payer: Medicare Other | Attending: Emergency Medicine | Admitting: Emergency Medicine

## 2012-04-15 DIAGNOSIS — J3489 Other specified disorders of nose and nasal sinuses: Secondary | ICD-10-CM | POA: Insufficient documentation

## 2012-04-15 DIAGNOSIS — E119 Type 2 diabetes mellitus without complications: Secondary | ICD-10-CM | POA: Insufficient documentation

## 2012-04-15 DIAGNOSIS — I10 Essential (primary) hypertension: Secondary | ICD-10-CM | POA: Insufficient documentation

## 2012-04-15 DIAGNOSIS — Z79899 Other long term (current) drug therapy: Secondary | ICD-10-CM | POA: Insufficient documentation

## 2012-04-15 DIAGNOSIS — R220 Localized swelling, mass and lump, head: Secondary | ICD-10-CM

## 2012-04-15 DIAGNOSIS — J342 Deviated nasal septum: Secondary | ICD-10-CM | POA: Insufficient documentation

## 2012-04-15 LAB — GLUCOSE, CAPILLARY: Glucose-Capillary: 84 mg/dL (ref 70–99)

## 2012-04-15 MED ORDER — SULFAMETHOXAZOLE-TRIMETHOPRIM 800-160 MG PO TABS: 1.0000 | ORAL_TABLET | Freq: Two times a day (BID) | ORAL | Status: DC

## 2012-04-15 NOTE — ED Provider Notes (Signed)
History  This chart was scribed for Ward Givens, MD by Ladona Ridgel Day. This patient was seen in room APA10/APA10 and the patient's care was started at 1019.   CSN: [id number]  Arrival date & time 04/15/12  1019   First MD Initiated Contact with Patient 04/15/12 1105      Chief Complaint  Patient presents with  . Foreign Body in Nose  . Facial Swelling   Patient is a 71 y.o. male presenting with foreign body in nose. The history is provided by the patient. No language interpreter was used.  Foreign Body in Nose This is a chronic problem. The current episode started more than 1 week ago. The problem occurs constantly. The problem has not changed since onset.Pertinent negatives include no shortness of breath. Nothing aggravates the symptoms. Nothing relieves the symptoms. He has tried nothing for the symptoms.   [name] is a 71 y.o. male who presents to the Emergency Department complaining of foreign body in nose since September 6 th from placing tissues in his nose because "I had bleeding and pus drainage" as well as "two bumps on my nose". He states the tissue has not come out and when he blows his nose he feels it changing positions. He states was on antibiotics which he finished September 22nd and mildly reduced swelling over his nose. Pt relates he has been in jail and was just released this morning. He denies fever, nausea, emesis. He has DM and he states BS has been normal. He does not smoke, but drinks ocassionally. He lives at home with his daughter and son.   His PCP is Dr. Leandrew Koyanagi in [place]  Past Medical History  Diagnosis Date  . Diabetes mellitus Since 1995  . Hypertension Since 1995  . Hyperlipidemia Since 1995  . Herpes   . Sinus infection     Past Surgical History  Procedure Date  . Back surgery     Lumbar  . Cervical spine surgery   . Rotator cuff repair     Right  . Wrist surgery     Left  . Hand surgery     Right  . Penile prosthesis implant      Family History  Problem Relation Age of Onset  . Heart attack Father 79    MI  . Hypertension Father   . Heart attack Brother 50    MI  . Heart attack Paternal Uncle   . Diabetes Mother     History  Substance Use Topics  . Smoking status: Never Smoker   . Smokeless tobacco: Not on file  . Alcohol Use: Yes     occ. use    Lives at home  Review of Systems  Constitutional: Negative for fever and chills.  HENT: Negative for nosebleeds.        Possible foreign body in nose  Respiratory: Negative for shortness of breath.   Gastrointestinal: Negative for nausea and vomiting.  Neurological: Negative for weakness.  All other systems reviewed and are negative.   Allergies  Aspirin  Home Medications   Current Outpatient Rx  Name Route Sig Dispense Refill  . ASPIRIN 81 MG PO TBEC Oral Take 81 mg by mouth daily.      . GLYBURIDE-METFORMIN 5-500 MG PO TABS Oral Take 1 tablet by mouth 2 (two) times daily with a meal.     . METOPROLOL SUCCINATE ER 50 MG PO TB24 Oral Take 50 mg by mouth daily.      Marland Kitchen  NAPROXEN 500 MG PO TABS Oral Take 500 mg by mouth 2 (two) times daily as needed. Pain    . OMEPRAZOLE 40 MG PO CPDR Oral Take 1 capsule (40 mg total) by mouth daily. 30 capsule 5  . OXYCODONE-ACETAMINOPHEN 5-325 MG PO TABS Oral Take 1 tablet by mouth Every 8 hours as needed. Pain    . RAMIPRIL 10 MG PO TABS Oral Take 10 mg by mouth daily.    Marland Kitchen SIMVASTATIN 20 MG PO TABS Oral Take 20 mg by mouth every evening.      Triage Vitals: BP 138/69  Pulse 79  Temp 98.1 F (36.7 C)  Resp 18  Ht 5\' 10"  (1.778 m)  Wt 156 lb (70.761 kg)  BMI 22.38 kg/m2  SpO2 100%  Physical Exam  Nursing note and vitals reviewed. Constitutional: He is oriented to person, place, and time. He appears well-developed and well-nourished.  Non-toxic appearance. He does not appear ill. No distress.  HENT:  Head: Normocephalic and atraumatic.  Right Ear: External ear normal.  Left Ear: External ear normal.   Nose: No mucosal edema or rhinorrhea.  Mouth/Throat: Oropharynx is clear and moist and mucous membranes are normal. No dental abscesses or uvula swelling.       Enlarged turbinates bilaterally. No obvious foreign body. No drainage. External sides over bridge of nose appear  mildly swollen  Eyes: Conjunctivae normal and EOM are normal. Pupils are equal, round, and reactive to light.  Neck: Normal range of motion and full passive range of motion without pain. Neck supple.  Pulmonary/Chest: Effort normal. No respiratory distress. He has no rhonchi. He exhibits no crepitus.  Abdominal: Normal appearance.  Musculoskeletal: Normal range of motion. He exhibits no edema and no tenderness.       Moves all extremities well.   Neurological: He is alert and oriented to person, place, and time. He has normal strength. No cranial nerve deficit.  Skin: Skin is warm, dry and intact. No rash noted. No erythema. No pallor.  Psychiatric: He has a normal mood and affect. His speech is normal and behavior is normal. His mood appears not anxious.    ED Course  Procedures (including critical care time)    DIAGNOSTIC STUDIES: Oxygen Saturation is 100% on room air, normal by my interpretation.    COORDINATION OF CARE: At 1220 PM Discussed treatment plan with patient which includes referral to ENT specialist. Patient agrees.    Ct Maxillofacial Wo Cm  04/15/2012  *RADIOLOGY REPORT*  Clinical Data: Intranasal mass.  The patient complains of a lump on his nose.  CT MAXILLOFACIAL WITHOUT CONTRAST  Technique:  Multidetector CT imaging of the maxillofacial structures was performed. Multiplanar CT image reconstructions were also generated.  Comparison: None.  Findings: A remote left nasal bone fracture is evident.  There is slight asymmetric thickening of the left cartilaginous portion of the nose into the left nasal ala.  A discrete mass is not evident. Anterior rightward nasal septal deviation of 7 mm is stable.   There is mild mucosal thickening covering the opening of the left ostiomeatal complex.  The right ostiomeatal complex is patent.  Mild mucosal thickening is scattered throughout the ethmoid air cells.  Limited imaging of the brain is unremarkable.  Moderate degenerative changes are noted in the upper cervical spine.  IMPRESSION:  1.  Stable appearance of left nasal fracture and anterior nasal septal deviation. 2.  Mild prominence of the cartilaginous and soft tissue component of the anterior nose.  A discrete mass is not seen.  Focal inflammation or even cellulitis is considered. Neoplasm is not excluded. 3.  Mild sinus disease.   Original Report Authenticated By: Jamesetta Orleans. MATTERN, [name]    Results for orders placed during the hospital encounter of 04/15/12  GLUCOSE, CAPILLARY      Component Value Range   Glucose-Capillary 84  70 - 99 mg/dL   Laboratory interpretation all normal  .  1. Swollen nose     New Prescriptions   SULFAMETHOXAZOLE-TRIMETHOPRIM (SEPTRA DS) 800-160 MG PER TABLET    Take 1 tablet by mouth every 12 (twelve) hours.    Plan discharge  Devoria Albe, MD, FACEP   MDM  I personally performed the services described in this documentation, which was scribed in my presence. The recorded information has been reviewed and considered. Devoria Albe, MD, Armando Gang          Ward Givens, MD 04/15/12 [phone]

## 2012-04-15 NOTE — ED Notes (Signed)
Pt c/o ?tissue stuck in right side of nose and "knots" inside of nose since 03/13/12. Airway patent. Nad noted.

## 2012-04-17 ENCOUNTER — Ambulatory Visit (INDEPENDENT_AMBULATORY_CARE_PROVIDER_SITE_OTHER): Payer: Medicare Other | Admitting: Otolaryngology

## 2012-04-17 DIAGNOSIS — D487 Neoplasm of uncertain behavior of other specified sites: Secondary | ICD-10-CM

## 2012-04-17 DIAGNOSIS — J342 Deviated nasal septum: Secondary | ICD-10-CM

## 2012-04-18 ENCOUNTER — Other Ambulatory Visit (INDEPENDENT_AMBULATORY_CARE_PROVIDER_SITE_OTHER): Payer: Self-pay | Admitting: Otolaryngology

## 2012-04-18 DIAGNOSIS — J3489 Other specified disorders of nose and nasal sinuses: Secondary | ICD-10-CM

## 2012-04-22 ENCOUNTER — Ambulatory Visit (HOSPITAL_COMMUNITY)
Admission: RE | Admit: 2012-04-22 | Discharge: 2012-04-22 | Disposition: A | Discharge status: Home / Self Care | Payer: Medicare Other | Source: Ambulatory Visit | Attending: Otolaryngology | Admitting: Otolaryngology

## 2012-04-22 DIAGNOSIS — J3489 Other specified disorders of nose and nasal sinuses: Secondary | ICD-10-CM

## 2012-04-22 DIAGNOSIS — R220 Localized swelling, mass and lump, head: Secondary | ICD-10-CM | POA: Insufficient documentation

## 2012-04-24 ENCOUNTER — Ambulatory Visit (INDEPENDENT_AMBULATORY_CARE_PROVIDER_SITE_OTHER): Payer: Medicare Other | Admitting: Otolaryngology

## 2012-04-24 DIAGNOSIS — J342 Deviated nasal septum: Secondary | ICD-10-CM

## 2012-04-24 DIAGNOSIS — J330 Polyp of nasal cavity: Secondary | ICD-10-CM

## 2012-05-13 ENCOUNTER — Encounter (INDEPENDENT_AMBULATORY_CARE_PROVIDER_SITE_OTHER): Payer: Self-pay

## 2012-05-20 ENCOUNTER — Ambulatory Visit (INDEPENDENT_AMBULATORY_CARE_PROVIDER_SITE_OTHER): Payer: Medicare Other | Admitting: Internal Medicine

## 2012-05-20 ENCOUNTER — Encounter (INDEPENDENT_AMBULATORY_CARE_PROVIDER_SITE_OTHER): Payer: Self-pay | Admitting: Internal Medicine

## 2012-05-20 VITALS — BP 150/78 | HR 72 | Temp 98.0°F | Ht 70.0 in | Wt 155.5 lb

## 2012-05-20 DIAGNOSIS — J329 Chronic sinusitis, unspecified: Secondary | ICD-10-CM

## 2012-05-20 MED ORDER — AZITHROMYCIN 250 MG PO TABS: ORAL_TABLET | ORAL | Status: DC

## 2012-05-20 NOTE — Patient Instructions (Addendum)
OV in 6 months with Dr. Karilyn Cota. Continue Omeprazole

## 2012-05-20 NOTE — Progress Notes (Signed)
Subjective:     Patient ID: [name], male   DOB: [birth year], 71 y.o.   MRN: [id number]  HPI [name] is a 71 yr old male here to day for f/u of his GERD. He tells me he was recently discharged from jail. He tells me he has an infection in his nose. He has been seen in the ED and has seen ENT for this. Placed on antibiotics. He ways he is doing good. He occasionally has acid reflux. For the most part, acid reflux is controlled with Omeprazole. Appetite is good. No weight loss. No abdominal pain. He has a BM about twice a day. No melena or bright red rectal bleeding. Hx H. Pylori wit treatment.  He also c/o sinus drainage and ear ache.  Feels like he has sinus infection.  02/2012 HPIChester is a 71 yr old male presenting today with c/o epigastric pain x 1 week. He says the pain comes and goes.  There is no pain now. He has been out of his Omeprazole for about a month  Appetite is good. He has lost 4 pounds since his last visit in March. He usually ha a BM about 1 a day. No melena or bright red rectal bleeding.  EGD 09/26/2009 Dr. 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  Hx of dilated CBD at 9mm on [place] with normal liver enzymes.  Review of Systems see hpi Current Outpatient Prescriptions  Medication Sig Dispense Refill  . aspirin 81 MG EC tablet Take 81 mg by mouth daily.        Marland Kitchen glyBURIDE-metformin (GLUCOVANCE) 5-500 MG per tablet Take 1 tablet by mouth 2 (two) times daily with a meal.       . metoprolol (TOPROL-XL) 50 MG 24 hr tablet Take 50 mg by mouth daily.        . naproxen (NAPROSYN) 500 MG tablet Take 500 mg by mouth 2 (two) times daily as needed. Pain      . omeprazole (PRILOSEC) 40 MG capsule Take 1 capsule (40 mg total) by mouth daily.  30 capsule  5  . oxyCODONE-acetaminophen (PERCOCET/ROXICET) 5-325 MG per tablet Take 1 tablet by mouth Every 8 hours as needed. Pain      . ramipril (ALTACE) 10 MG  tablet Take 10 mg by mouth daily.      . simvastatin (ZOCOR) 20 MG tablet Take 20 mg by mouth every evening.      . [DISCONTINUED] bismuth-metronidazole-tetracycline (PLYERA) 140-125-125 MG per capsule Take 3 capsules by mouth 4 (four) times daily -  before meals and at bedtime.       Past Medical History  Diagnosis Date  . Diabetes mellitus Since 1995  . Hypertension Since 1995  . Hyperlipidemia Since 1995  . Herpes   . Sinus infection         Objective:   Physical Exam  Filed Vitals:   05/20/12 0929  BP: 150/78  Pulse: 72  Temp: 98 F (36.7 C)  Height: 5\' 10"  (1.778 m)  Weight: 155 lb 8 oz (70.534 kg)  Alert and oriented. Skin warm and dry. Oral mucosa is moist.   . Sclera anicteric, conjunctivae is pink. Thyroid not enlarged. No cervical lymphadenopathy. Lungs clear. Heart regular rate and rhythm.  Abdomen is soft. Bowel sounds are positive. No hepatomegaly. No abdominal masses felt. No tenderness.  No edema to lower extremities.       Assessment:  GERD controlled at this time. He has no GI issues. Keep appt with Dr. Christain Sacramento for nasal mass.   Sinusitis: Earache and sinus drainage.  Plan:   Continue Omeprazole.  No spicy foods. Watch sugars. OV 6 months with Dr Karilyn Cota Rx for a z-pak eprescribed to pharmach

## 2012-06-19 ENCOUNTER — Other Ambulatory Visit (HOSPITAL_COMMUNITY)
Admission: RE | Admit: 2012-06-19 | Discharge: 2012-06-19 | Disposition: A | Discharge status: Home / Self Care | Payer: Medicare Other | Source: Ambulatory Visit | Attending: Otolaryngology | Admitting: Otolaryngology

## 2012-06-19 ENCOUNTER — Ambulatory Visit (INDEPENDENT_AMBULATORY_CARE_PROVIDER_SITE_OTHER): Payer: Medicare Other | Admitting: Otolaryngology

## 2012-06-19 DIAGNOSIS — R220 Localized swelling, mass and lump, head: Secondary | ICD-10-CM | POA: Insufficient documentation

## 2012-06-19 DIAGNOSIS — D140 Benign neoplasm of middle ear, nasal cavity and accessory sinuses: Secondary | ICD-10-CM

## 2012-08-18 ENCOUNTER — Other Ambulatory Visit: Payer: Self-pay | Admitting: Neurosurgery

## 2012-08-25 NOTE — Pre-Procedure Instructions (Signed)
ROCHELL MABIE  08/25/2012   Your procedure is scheduled on:  Fri, Feb 21 @ 1:15 PM  Report to [facility] at 10:00 AM.  Call this number if you have problems the morning of surgery: [phone]   Remember:   Do not eat food or drink liquids after midnight.   Take these medicines the morning of surgery with A SIP OF WATER: Zithromax(Azithromax),Metoprolol(Toprol),Omeprazole(Prilosec),and Pain Pill(if needed)              Stop taking your Aspirin and Naproxen.No Goody's,BC's,Ibuprofen,or Herbal Medications   Do not wear jewelry.  Do not wear lotions, powders, or colognes. You may wear deodorant.             Men may shave face and neck.  Do not bring valuables to the hospital.  Contacts, dentures or bridgework may not be worn into surgery.  Leave suitcase in the car. After surgery it may be brought to your room.  For patients admitted to the hospital, checkout time is 11:00 AM the day of  discharge.   Patients discharged the day of surgery will not be allowed to drive  home.  Special Instructions: Shower using CHG 2 nights before surgery and the night before surgery.  If you shower the day of surgery use CHG.  Use special wash - you have one bottle of CHG for all showers.  You should use approximately 1/3 of the bottle for each shower.   Please read over the following fact sheets that you were given: Pain Booklet, Coughing and Deep Breathing, MRSA Information and Surgical Site Infection Prevention

## 2012-08-26 ENCOUNTER — Encounter (HOSPITAL_COMMUNITY)
Admission: RE | Admit: 2012-08-26 | Discharge: 2012-08-26 | Disposition: A | Discharge status: Home / Self Care | Payer: Medicare Other | Source: Ambulatory Visit | Attending: Neurosurgery | Admitting: Neurosurgery

## 2012-08-26 ENCOUNTER — Encounter (HOSPITAL_COMMUNITY): Payer: Self-pay

## 2012-08-26 ENCOUNTER — Encounter (HOSPITAL_COMMUNITY)
Admission: RE | Admit: 2012-08-26 | Discharge: 2012-08-26 | Disposition: A | Discharge status: Home / Self Care | Payer: Medicare Other | Source: Ambulatory Visit | Attending: Anesthesiology | Admitting: Anesthesiology

## 2012-08-26 HISTORY — DX: Other chronic pain: G89.29

## 2012-08-26 HISTORY — DX: Dorsalgia, unspecified: M54.9

## 2012-08-26 HISTORY — DX: Sleep apnea, unspecified: G47.30

## 2012-08-26 HISTORY — DX: Personal history of peptic ulcer disease: Z87.11

## 2012-08-26 HISTORY — DX: Personal history of other diseases of the digestive system: Z87.19

## 2012-08-26 HISTORY — DX: Gastro-esophageal reflux disease without esophagitis: K21.9

## 2012-08-26 HISTORY — DX: Personal history of colon polyps, unspecified: Z86.0100

## 2012-08-26 HISTORY — DX: Urgency of urination: R39.15

## 2012-08-26 HISTORY — DX: Frequency of micturition: R35.0

## 2012-08-26 HISTORY — DX: Unspecified osteoarthritis, unspecified site: M19.90

## 2012-08-26 HISTORY — DX: Pneumonia, unspecified organism: J18.9

## 2012-08-26 HISTORY — DX: Personal history of colonic polyps: Z86.010

## 2012-08-26 LAB — BASIC METABOLIC PANEL
CO2: 29 mEq/L (ref 19–32)
Calcium: 9.6 mg/dL (ref 8.4–10.5)
Creatinine, Ser: 1.18 mg/dL (ref 0.50–1.35)
Glucose, Bld: 129 mg/dL — ABNORMAL HIGH (ref 70–99)

## 2012-08-26 LAB — CBC
MCH: 31.6 pg (ref 26.0–34.0)
MCV: 91.3 fL (ref 78.0–100.0)
Platelets: 183 10*3/uL (ref 150–400)
RDW: 12.7 % (ref 11.5–15.5)

## 2012-08-26 NOTE — Progress Notes (Addendum)
Last cardiology visit in epic from 2012 with Dr.Hochrein-just sees him on an as needed  Stress test done a couple of yrs ago at [facility]-to request report and if echo was done will request as well.Any other cardiac related info requested Heart cath in epic from 2011  Dr.Berdine with [facility] in [place] is Medical Md  EKG in epic from 09/03/11 CXR in epic from 03/02/12

## 2012-08-26 NOTE — Progress Notes (Addendum)
Sleep study done > 23yrs ago;was using a CPAP but hasn't in over 70yrs

## 2012-08-26 NOTE — Progress Notes (Signed)
Requested orders to be signed by Dr.Botero-spoke with Shanda Bumps

## 2012-08-26 NOTE — Progress Notes (Signed)
Repeating CXR today phlem that is "creamy" looking and is becoming more frequency

## 2012-08-26 NOTE — Progress Notes (Signed)
Pt had a flex pen but hasn't used in several months

## 2012-08-29 ENCOUNTER — Inpatient Hospital Stay (HOSPITAL_COMMUNITY)
Admission: RE | Admit: 2012-08-29 | Discharge: 2012-08-31 | DRG: 491 | Disposition: A | Discharge status: Home / Self Care | Payer: Medicare Other | Attending: Neurosurgery | Admitting: Neurosurgery

## 2012-08-29 ENCOUNTER — Ambulatory Visit (HOSPITAL_COMMUNITY): Admission: RE | Admit: 2012-08-29 | Payer: Medicare Other | Source: Ambulatory Visit | Admitting: Neurosurgery

## 2012-08-29 ENCOUNTER — Encounter (HOSPITAL_COMMUNITY): Payer: Self-pay | Admitting: *Deleted

## 2012-08-29 ENCOUNTER — Inpatient Hospital Stay (HOSPITAL_COMMUNITY): Payer: Medicare Other | Admitting: Anesthesiology

## 2012-08-29 ENCOUNTER — Encounter (HOSPITAL_COMMUNITY): Admission: RE | Disposition: A | Discharge status: Home / Self Care | Payer: Self-pay | Attending: Neurosurgery

## 2012-08-29 ENCOUNTER — Inpatient Hospital Stay (HOSPITAL_COMMUNITY): Payer: Medicare Other

## 2012-08-29 ENCOUNTER — Encounter (HOSPITAL_COMMUNITY): Payer: Self-pay | Admitting: Anesthesiology

## 2012-08-29 DIAGNOSIS — K219 Gastro-esophageal reflux disease without esophagitis: Secondary | ICD-10-CM | POA: Diagnosis present

## 2012-08-29 DIAGNOSIS — E119 Type 2 diabetes mellitus without complications: Secondary | ICD-10-CM | POA: Diagnosis present

## 2012-08-29 DIAGNOSIS — M545 Low back pain: Secondary | ICD-10-CM

## 2012-08-29 DIAGNOSIS — I10 Essential (primary) hypertension: Secondary | ICD-10-CM | POA: Diagnosis present

## 2012-08-29 DIAGNOSIS — E785 Hyperlipidemia, unspecified: Secondary | ICD-10-CM | POA: Diagnosis present

## 2012-08-29 DIAGNOSIS — M48062 Spinal stenosis, lumbar region with neurogenic claudication: Principal | ICD-10-CM | POA: Diagnosis present

## 2012-08-29 DIAGNOSIS — D1779 Benign lipomatous neoplasm of other sites: Secondary | ICD-10-CM | POA: Diagnosis present

## 2012-08-29 HISTORY — PX: LUMBAR LAMINECTOMY/DECOMPRESSION MICRODISCECTOMY: SHX5026

## 2012-08-29 SURGERY — LUMBAR LAMINECTOMY/DECOMPRESSION MICRODISCECTOMY 1 LEVEL
Anesthesia: General | Site: Back | Wound class: Clean

## 2012-08-29 SURGERY — LUMBAR LAMINECTOMY/DECOMPRESSION MICRODISCECTOMY 1 LEVEL
Anesthesia: General | Site: Back

## 2012-08-29 MED ORDER — MENTHOL 3 MG MT LOZG
1.0000 | LOZENGE | OROMUCOSAL | Status: DC | PRN
  Filled 2012-08-29: qty 9

## 2012-08-29 MED ORDER — ROCURONIUM BROMIDE 100 MG/10ML IV SOLN
INTRAVENOUS | Status: DC | PRN
  Administered 2012-08-29: 50 mg via INTRAVENOUS

## 2012-08-29 MED ORDER — GLYBURIDE-METFORMIN 5-500 MG PO TABS: 2.0000 | ORAL_TABLET | Freq: Two times a day (BID) | ORAL | Status: DC

## 2012-08-29 MED ORDER — GLYCOPYRROLATE 0.2 MG/ML IJ SOLN
INTRAMUSCULAR | Status: DC | PRN
  Administered 2012-08-29: 0.6 mg via INTRAVENOUS

## 2012-08-29 MED ORDER — LIDOCAINE HCL (CARDIAC) 20 MG/ML IV SOLN
INTRAVENOUS | Status: DC | PRN
  Administered 2012-08-29: 30 mg via INTRAVENOUS

## 2012-08-29 MED ORDER — DIPHENHYDRAMINE HCL 12.5 MG/5ML PO ELIX
12.5000 mg | ORAL_SOLUTION | Freq: Four times a day (QID) | ORAL | Status: DC | PRN
  Filled 2012-08-29: qty 5

## 2012-08-29 MED ORDER — 0.9 % SODIUM CHLORIDE (POUR BTL) OPTIME
TOPICAL | Status: DC | PRN
  Administered 2012-08-29 (×2): 1000 mL

## 2012-08-29 MED ORDER — HEMOSTATIC AGENTS (NO CHARGE) OPTIME
TOPICAL | Status: DC | PRN
  Administered 2012-08-29: 1 via TOPICAL

## 2012-08-29 MED ORDER — EPHEDRINE SULFATE 50 MG/ML IJ SOLN
INTRAMUSCULAR | Status: DC | PRN
  Administered 2012-08-29 (×2): 10 mg via INTRAVENOUS
  Administered 2012-08-29: 5 mg via INTRAVENOUS

## 2012-08-29 MED ORDER — ACETAMINOPHEN 325 MG PO TABS: 650.0000 mg | ORAL_TABLET | ORAL | Status: DC | PRN

## 2012-08-29 MED ORDER — MORPHINE SULFATE (PF) 1 MG/ML IV SOLN
INTRAVENOUS | Status: AC
  Filled 2012-08-29: qty 25

## 2012-08-29 MED ORDER — PROMETHAZINE HCL 25 MG/ML IJ SOLN: 6.2500 mg | INTRAMUSCULAR | Status: DC | PRN

## 2012-08-29 MED ORDER — SODIUM CHLORIDE 0.9 % IJ SOLN
3.0000 mL | Freq: Two times a day (BID) | INTRAMUSCULAR | Status: DC
  Administered 2012-08-30: 3 mL via INTRAVENOUS

## 2012-08-29 MED ORDER — ONDANSETRON HCL 4 MG/2ML IJ SOLN
INTRAMUSCULAR | Status: DC | PRN
  Administered 2012-08-29: 4 mg via INTRAVENOUS

## 2012-08-29 MED ORDER — FENTANYL CITRATE 0.05 MG/ML IJ SOLN
INTRAMUSCULAR | Status: DC | PRN
  Administered 2012-08-29: 100 ug via INTRAVENOUS
  Administered 2012-08-29: 50 ug via INTRAVENOUS
  Administered 2012-08-29: 100 ug via INTRAVENOUS

## 2012-08-29 MED ORDER — ACETAMINOPHEN 650 MG RE SUPP: 650.0000 mg | RECTAL | Status: DC | PRN

## 2012-08-29 MED ORDER — SODIUM CHLORIDE 0.9 % IJ SOLN: 3.0000 mL | INTRAMUSCULAR | Status: DC | PRN

## 2012-08-29 MED ORDER — ZOLPIDEM TARTRATE 5 MG PO TABS: 5.0000 mg | ORAL_TABLET | Freq: Every evening | ORAL | Status: DC | PRN

## 2012-08-29 MED ORDER — OXYCODONE HCL 5 MG/5ML PO SOLN: 5.0000 mg | Freq: Once | ORAL | Status: DC | PRN

## 2012-08-29 MED ORDER — HYDROMORPHONE HCL PF 1 MG/ML IJ SOLN
0.2500 mg | INTRAMUSCULAR | Status: DC | PRN
  Administered 2012-08-29 (×2): 0.5 mg via INTRAVENOUS

## 2012-08-29 MED ORDER — RAMIPRIL 10 MG PO TABS: 10.0000 mg | ORAL_TABLET | Freq: Every day | ORAL | Status: DC

## 2012-08-29 MED ORDER — PHENOL 1.4 % MT LIQD: 1.0000 | OROMUCOSAL | Status: DC | PRN

## 2012-08-29 MED ORDER — CEFAZOLIN SODIUM-DEXTROSE 2-3 GM-% IV SOLR
INTRAVENOUS | Status: AC
  Administered 2012-08-29: 2 g via INTRAVENOUS
  Filled 2012-08-29: qty 50

## 2012-08-29 MED ORDER — ONDANSETRON HCL 4 MG/2ML IJ SOLN
4.0000 mg | INTRAMUSCULAR | Status: DC | PRN
  Administered 2012-08-30 (×2): 4 mg via INTRAVENOUS
  Filled 2012-08-29 (×2): qty 2

## 2012-08-29 MED ORDER — SIMVASTATIN 20 MG PO TABS
20.0000 mg | ORAL_TABLET | Freq: Every evening | ORAL | Status: DC
  Administered 2012-08-29: 20 mg via ORAL
  Filled 2012-08-29 (×3): qty 1

## 2012-08-29 MED ORDER — THROMBIN 5000 UNITS EX SOLR
CUTANEOUS | Status: DC | PRN
  Administered 2012-08-29 (×2): 5000 [IU] via TOPICAL

## 2012-08-29 MED ORDER — BUPIVACAINE LIPOSOME 1.3 % IJ SUSP
INTRAMUSCULAR | Status: DC | PRN
  Administered 2012-08-29: 20 mL

## 2012-08-29 MED ORDER — METFORMIN HCL 500 MG PO TABS
500.0000 mg | ORAL_TABLET | Freq: Two times a day (BID) | ORAL | Status: DC
  Administered 2012-08-31: 500 mg via ORAL
  Filled 2012-08-29 (×5): qty 1

## 2012-08-29 MED ORDER — LACTATED RINGERS IV SOLN
INTRAVENOUS | Status: DC | PRN
  Administered 2012-08-29 (×2): via INTRAVENOUS

## 2012-08-29 MED ORDER — NALOXONE HCL 0.4 MG/ML IJ SOLN
0.4000 mg | INTRAMUSCULAR | Status: DC | PRN
  Filled 2012-08-29: qty 1

## 2012-08-29 MED ORDER — OXYCODONE-ACETAMINOPHEN 5-325 MG PO TABS
1.0000 | ORAL_TABLET | ORAL | Status: DC | PRN
  Administered 2012-08-30 – 2012-08-31 (×5): 2 via ORAL
  Filled 2012-08-29 (×5): qty 2

## 2012-08-29 MED ORDER — SODIUM CHLORIDE 0.9 % IV SOLN: 250.0000 mL | INTRAVENOUS | Status: DC

## 2012-08-29 MED ORDER — ARTIFICIAL TEARS OP OINT
TOPICAL_OINTMENT | OPHTHALMIC | Status: DC | PRN
  Administered 2012-08-29: 1 via OPHTHALMIC

## 2012-08-29 MED ORDER — SODIUM CHLORIDE 0.9 % IJ SOLN: 9.0000 mL | INTRAMUSCULAR | Status: DC | PRN

## 2012-08-29 MED ORDER — PANTOPRAZOLE SODIUM 40 MG PO TBEC
40.0000 mg | DELAYED_RELEASE_TABLET | Freq: Every day | ORAL | Status: DC
  Administered 2012-08-30 – 2012-08-31 (×2): 40 mg via ORAL
  Filled 2012-08-29 (×2): qty 1

## 2012-08-29 MED ORDER — METOPROLOL SUCCINATE ER 50 MG PO TB24
50.0000 mg | ORAL_TABLET | Freq: Every day | ORAL | Status: DC
  Administered 2012-08-30 – 2012-08-31 (×2): 50 mg via ORAL
  Filled 2012-08-29 (×2): qty 1

## 2012-08-29 MED ORDER — PHENYLEPHRINE HCL 10 MG/ML IJ SOLN
INTRAMUSCULAR | Status: DC | PRN
  Administered 2012-08-29: 40 ug via INTRAVENOUS

## 2012-08-29 MED ORDER — OXYCODONE HCL 5 MG PO TABS: 5.0000 mg | ORAL_TABLET | Freq: Once | ORAL | Status: DC | PRN

## 2012-08-29 MED ORDER — BUPIVACAINE LIPOSOME 1.3 % IJ SUSP
20.0000 mL | Freq: Once | INTRAMUSCULAR | Status: DC
  Filled 2012-08-29: qty 20

## 2012-08-29 MED ORDER — HYDROMORPHONE HCL PF 1 MG/ML IJ SOLN
INTRAMUSCULAR | Status: AC
  Filled 2012-08-29: qty 1

## 2012-08-29 MED ORDER — DIAZEPAM 5 MG PO TABS: 5.0000 mg | ORAL_TABLET | Freq: Four times a day (QID) | ORAL | Status: DC | PRN

## 2012-08-29 MED ORDER — CEFAZOLIN SODIUM 1-5 GM-% IV SOLN
1.0000 g | Freq: Three times a day (TID) | INTRAVENOUS | Status: AC
  Administered 2012-08-29 – 2012-08-30 (×2): 1 g via INTRAVENOUS
  Filled 2012-08-29 (×2): qty 50

## 2012-08-29 MED ORDER — ONDANSETRON HCL 4 MG/2ML IJ SOLN: 4.0000 mg | Freq: Four times a day (QID) | INTRAMUSCULAR | Status: DC | PRN

## 2012-08-29 MED ORDER — MORPHINE SULFATE (PF) 1 MG/ML IV SOLN
INTRAVENOUS | Status: DC
  Administered 2012-08-29: 6 mg via INTRAVENOUS
  Administered 2012-08-29 – 2012-08-30 (×2): 1.5 mg via INTRAVENOUS
  Administered 2012-08-30: 6 mg via INTRAVENOUS
  Filled 2012-08-29: qty 25

## 2012-08-29 MED ORDER — MIDAZOLAM HCL 2 MG/2ML IJ SOLN: 1.0000 mg | INTRAMUSCULAR | Status: DC | PRN

## 2012-08-29 MED ORDER — RAMIPRIL 10 MG PO CAPS
10.0000 mg | ORAL_CAPSULE | Freq: Every day | ORAL | Status: DC
  Administered 2012-08-30 – 2012-08-31 (×2): 10 mg via ORAL
  Filled 2012-08-29 (×2): qty 1

## 2012-08-29 MED ORDER — LINAGLIPTIN 5 MG PO TABS
5.0000 mg | ORAL_TABLET | Freq: Every day | ORAL | Status: DC
  Administered 2012-08-30 – 2012-08-31 (×2): 5 mg via ORAL
  Filled 2012-08-29 (×2): qty 1

## 2012-08-29 MED ORDER — NEOSTIGMINE METHYLSULFATE 1 MG/ML IJ SOLN
INTRAMUSCULAR | Status: DC | PRN
  Administered 2012-08-29: 4 mg via INTRAVENOUS

## 2012-08-29 MED ORDER — SODIUM CHLORIDE 0.9 % IV SOLN: INTRAVENOUS | Status: DC

## 2012-08-29 MED ORDER — FENTANYL CITRATE 0.05 MG/ML IJ SOLN: 50.0000 ug | Freq: Once | INTRAMUSCULAR | Status: DC

## 2012-08-29 MED ORDER — PROPOFOL 10 MG/ML IV BOLUS
INTRAVENOUS | Status: DC | PRN
  Administered 2012-08-29: 200 mg via INTRAVENOUS

## 2012-08-29 MED ORDER — TAMSULOSIN HCL 0.4 MG PO CAPS
0.4000 mg | ORAL_CAPSULE | Freq: Every day | ORAL | Status: DC
  Administered 2012-08-29 – 2012-08-30 (×2): 0.4 mg via ORAL
  Filled 2012-08-29 (×3): qty 1

## 2012-08-29 MED ORDER — DIPHENHYDRAMINE HCL 50 MG/ML IJ SOLN
12.5000 mg | Freq: Four times a day (QID) | INTRAMUSCULAR | Status: DC | PRN
  Administered 2012-08-30: 12.5 mg via INTRAVENOUS
  Filled 2012-08-29: qty 0.25
  Filled 2012-08-29: qty 1

## 2012-08-29 MED ORDER — GLYBURIDE 5 MG PO TABS
5.0000 mg | ORAL_TABLET | Freq: Two times a day (BID) | ORAL | Status: DC
  Administered 2012-08-31: 5 mg via ORAL
  Filled 2012-08-29 (×5): qty 1

## 2012-08-29 SURGICAL SUPPLY — 67 items
0.5% SENSORCAINE WITH EPI (MPF) IMPLANT
APL SKNCLS STERI-STRIP NONHPOA (GAUZE/BANDAGES/DRESSINGS) ×1
BENZOIN TINCTURE PRP APPL 2/3 (GAUZE/BANDAGES/DRESSINGS) ×2 IMPLANT
BLADE SURG ROTATE 9660 (MISCELLANEOUS) IMPLANT
BUR ACORN 6.0 (BURR) ×2 IMPLANT
BUR MATCHSTICK NEURO 3.0 LAGG (BURR) ×1 IMPLANT
CANISTER SUCTION 2500CC (MISCELLANEOUS) ×2 IMPLANT
CLOTH BEACON ORANGE TIMEOUT ST (SAFETY) ×2 IMPLANT
CONT SPEC 4OZ CLIKSEAL STRL BL (MISCELLANEOUS) ×2 IMPLANT
DRAPE LAPAROTOMY 100X72X124 (DRAPES) ×2 IMPLANT
DRAPE MICROSCOPE LEICA (MISCELLANEOUS) ×2 IMPLANT
DRAPE POUCH INSTRU U-SHP 10X18 (DRAPES) ×2 IMPLANT
DRSG PAD ABDOMINAL 8X10 ST (GAUZE/BANDAGES/DRESSINGS) ×2 IMPLANT
DURAPREP 26ML APPLICATOR (WOUND CARE) ×2 IMPLANT
ELECT REM PT RETURN 9FT ADLT (ELECTROSURGICAL) ×2
ELECTRODE REM PT RTRN 9FT ADLT (ELECTROSURGICAL) ×1 IMPLANT
EVACUATOR 3/16  PVC DRAIN (DRAIN) ×1
EVACUATOR 3/16 PVC DRAIN (DRAIN) IMPLANT
GAUZE SPONGE 4X4 16PLY XRAY LF (GAUZE/BANDAGES/DRESSINGS) IMPLANT
GLOVE BIO SURGEON STRL SZ 6.5 (GLOVE) ×3 IMPLANT
GLOVE BIO SURGEON STRL SZ8.5 (GLOVE) ×1 IMPLANT
GLOVE BIOGEL M 8.0 STRL (GLOVE) ×2 IMPLANT
GLOVE EXAM NITRILE LRG STRL (GLOVE) IMPLANT
GLOVE EXAM NITRILE MD LF STRL (GLOVE) IMPLANT
GLOVE EXAM NITRILE XL STR (GLOVE) IMPLANT
GLOVE EXAM NITRILE XS STR PU (GLOVE) IMPLANT
GLOVE INDICATOR 6.5 STRL GRN (GLOVE) ×2 IMPLANT
GLOVE INDICATOR 7.0 STRL GRN (GLOVE) ×1 IMPLANT
GLOVE INDICATOR 7.5 STRL GRN (GLOVE) ×1 IMPLANT
GLOVE SS BIOGEL STRL SZ 6.5 (GLOVE) IMPLANT
GLOVE SS BIOGEL STRL SZ 8 (GLOVE) IMPLANT
GLOVE SUPERSENSE BIOGEL SZ 6.5 (GLOVE) ×1
GLOVE SUPERSENSE BIOGEL SZ 8 (GLOVE) ×1
GOWN BRE IMP SLV AUR LG STRL (GOWN DISPOSABLE) ×2 IMPLANT
GOWN BRE IMP SLV AUR XL STRL (GOWN DISPOSABLE) ×1 IMPLANT
GOWN STRL REIN 2XL LVL4 (GOWN DISPOSABLE) ×1 IMPLANT
KIT BASIN OR (CUSTOM PROCEDURE TRAY) ×2 IMPLANT
KIT ROOM TURNOVER OR (KITS) ×2 IMPLANT
NDL HYPO 18GX1.5 BLUNT FILL (NEEDLE) IMPLANT
NDL HYPO 21X1.5 SAFETY (NEEDLE) IMPLANT
NDL HYPO 25X1 1.5 SAFETY (NEEDLE) IMPLANT
NDL SPNL 20GX3.5 QUINCKE YW (NEEDLE) IMPLANT
NEEDLE HYPO 18GX1.5 BLUNT FILL (NEEDLE) IMPLANT
NEEDLE HYPO 21X1.5 SAFETY (NEEDLE) ×2 IMPLANT
NEEDLE HYPO 25X1 1.5 SAFETY (NEEDLE) IMPLANT
NEEDLE SPNL 20GX3.5 QUINCKE YW (NEEDLE) ×2 IMPLANT
NS IRRIG 1000ML POUR BTL (IV SOLUTION) ×2 IMPLANT
PACK LAMINECTOMY NEURO (CUSTOM PROCEDURE TRAY) ×2 IMPLANT
PAD ARMBOARD 7.5X6 YLW CONV (MISCELLANEOUS) ×6 IMPLANT
PATTIES SURGICAL .5 X1 (DISPOSABLE) ×2 IMPLANT
RUBBERBAND STERILE (MISCELLANEOUS) ×4 IMPLANT
SPONGE GAUZE 4X4 12PLY (GAUZE/BANDAGES/DRESSINGS) ×2 IMPLANT
SPONGE LAP 4X18 X RAY DECT (DISPOSABLE) IMPLANT
SPONGE SURGIFOAM ABS GEL SZ50 (HEMOSTASIS) ×2 IMPLANT
STAPLER SKIN PROX WIDE 3.9 (STAPLE) ×1 IMPLANT
STRIP CLOSURE SKIN 1/2X4 (GAUZE/BANDAGES/DRESSINGS) ×2 IMPLANT
SUT VIC AB 0 CT1 18XCR BRD8 (SUTURE) ×1 IMPLANT
SUT VIC AB 0 CT1 8-18 (SUTURE) ×4
SUT VIC AB 2-0 CP2 18 (SUTURE) ×2 IMPLANT
SUT VIC AB 3-0 SH 8-18 (SUTURE) ×2 IMPLANT
SYR 20CC LL (SYRINGE) ×1 IMPLANT
SYR 20ML ECCENTRIC (SYRINGE) ×2 IMPLANT
SYR 5ML LL (SYRINGE) IMPLANT
TAPE CLOTH SURG 4X10 WHT LF (GAUZE/BANDAGES/DRESSINGS) ×1 IMPLANT
TOWEL OR 17X24 6PK STRL BLUE (TOWEL DISPOSABLE) ×2 IMPLANT
TOWEL OR 17X26 10 PK STRL BLUE (TOWEL DISPOSABLE) ×2 IMPLANT
WATER STERILE IRR 1000ML POUR (IV SOLUTION) ×2 IMPLANT

## 2012-08-29 NOTE — H&P (Signed)
[name] is an 72 y.o. male.   Chief Complaint: lbp HPI: lumbar pain with radiation to both legs with the left given out on him for several months . He is not better.  Past Medical History  Diagnosis Date  . Hyperlipidemia Since 1995    takes Simvastatin daily  . Herpes   . Hypertension Since 1995    takes Metoprolol and Ramipril daily  . Sleep apnea     doesn't use CPAP  . Pneumonia     hx of;as a child  . Arthritis     fingers  . Chronic back pain   . GERD (gastroesophageal reflux disease)     takes Dexilant and Omeprazole daily  . History of gastric ulcer 25+yrs ago  . History of colon polyps   . Urinary urgency     takes Flomax daily  . Urinary frequency   . Diabetes mellitus Since 1995    Takes Glucovance and Onglyza daily    Past Surgical History  Procedure Laterality Date  . Cervical spine surgery    . Rotator cuff repair      Right  . Wrist surgery      Left  . Hand surgery      Right  . Penile prosthesis implant    . Back surgery  1962    Lumbar  . Bilateral cataract surgery    . Cardiac catheterization  02/16/10  . Colonoscopy    . Esophagogastroduodenoscopy (egd) with esophageal dilation      Family History  Problem Relation Age of Onset  . Heart attack Father 64    MI  . Hypertension Father   . Heart attack Brother 50    MI  . Heart attack Paternal Uncle   . Diabetes Mother    Social History:  reports that he has never smoked. He does not have any smokeless tobacco history on file. He reports that  drinks alcohol. He reports that he uses illicit drugs (Marijuana).  Allergies:  Allergies  Allergen Reactions  . Aspirin     REACTION: higher dosage upsets stomach    Medications Prior to Admission  Medication Sig Dispense Refill  . aspirin 81 MG EC tablet Take 81 mg by mouth daily.        Marland Kitchen dexlansoprazole (DEXILANT) 60 MG capsule Take 60 mg by mouth 2 (two) times daily.      Marland Kitchen glyBURIDE-metformin (GLUCOVANCE) 5-500 MG per tablet  Take 2 tablets by mouth 2 (two) times daily with a meal.       . metoprolol (TOPROL-XL) 50 MG 24 hr tablet Take 50 mg by mouth daily.        Marland Kitchen omeprazole (PRILOSEC) 40 MG capsule Take 40 mg by mouth daily.      . ramipril (ALTACE) 10 MG tablet Take 10 mg by mouth daily.      . saxagliptin HCl (ONGLYZA) 5 MG TABS tablet Take 5 mg by mouth daily.      . simvastatin (ZOCOR) 20 MG tablet Take 20 mg by mouth every evening.      . Tamsulosin HCl (FLOMAX) 0.4 MG CAPS Take 0.4 mg by mouth daily.        Results for orders placed during the hospital encounter of 08/29/12 (from the past 48 hour(s))  GLUCOSE, CAPILLARY     Status: Abnormal   Collection Time    08/29/12 10:38 AM      Result Value Range   Glucose-Capillary 124 (*) 70 -  99 mg/dL  GLUCOSE, CAPILLARY     Status: None   Collection Time    08/29/12 12:33 PM      Result Value Range   Glucose-Capillary 91  70 - 99 mg/dL   No results found.  Review of Systems  Constitutional: Negative.   HENT: Negative.   Eyes: Negative.   Respiratory: Negative.   Cardiovascular: Negative.   Gastrointestinal:       Diabetes mellitus  Genitourinary: Negative.   Musculoskeletal: Positive for back pain.  Skin: Negative.   Neurological: Positive for sensory change and focal weakness.  Endo/Heme/Allergies: Negative.   Psychiatric/Behavioral: Negative.     Blood pressure 163/90, pulse 56, temperature 98 F (36.7 C), temperature source Tympanic, resp. rate 18, SpO2 100.00%. Physical Exam hent, nl. Neck,nl. Cv, nl. Lungs, clear. Abdomen, soft. Exytremities, nl. NEURO WEAKNESS OF DF BOTH FEET. SLR positive at 60. Mri shows severe stenosis at l45  Assessment/Plan decompression at l45 . Patient aware of risks and benefits   Kymberly Blomberg M 08/29/2012, 2:31 PM

## 2012-08-29 NOTE — Progress Notes (Signed)
Op note [id number]

## 2012-08-29 NOTE — Transfer of Care (Signed)
Immediate Anesthesia Transfer of Care Note  Patient: [name]  Procedure(s) Performed: Procedure(s) with comments: LUMBAR LAMINECTOMY/DECOMPRESSION MICRODISCECTOMY 1 LEVEL (N/A) - Lumbar four-five laminectomies  Patient Location: PACU  Anesthesia Type:General  Level of Consciousness: awake, alert , oriented and patient cooperative  Airway & Oxygen Therapy: Patient Spontanous Breathing and Patient connected to nasal cannula oxygen  Post-op Assessment: Report given to PACU RN, Post -op Vital signs reviewed and stable and Patient moving all extremities  Post vital signs: Reviewed and stable  Complications: No apparent anesthesia complications

## 2012-08-29 NOTE — Preoperative (Signed)
Beta Blockers   Reason not to administer Beta Blockers:took metoprolol this am 08/29/12

## 2012-08-29 NOTE — Anesthesia Postprocedure Evaluation (Signed)
  Anesthesia Post-op Note  Patient: [name]  Procedure(s) Performed: Procedure(s) with comments: LUMBAR LAMINECTOMY/DECOMPRESSION MICRODISCECTOMY 1 LEVEL (N/A) - Lumbar four-five laminectomies  Patient Location: PACU  Anesthesia Type:General  Level of Consciousness: awake, alert  and oriented  Airway and Oxygen Therapy: Patient Spontanous Breathing and Patient connected to nasal cannula oxygen  Post-op Pain: mild  Post-op Assessment: Post-op Vital signs reviewed  Post-op Vital Signs: Reviewed  Complications: No apparent anesthesia complications

## 2012-08-29 NOTE — Anesthesia Preprocedure Evaluation (Addendum)
Anesthesia Evaluation Anesthesia Physical Anesthesia Plan  ASA: III  Anesthesia Plan:    Post-op Pain Management:    Induction:   Airway Management Planned:   Additional Equipment:   Intra-op Plan:   Post-operative Plan:   Informed Consent:   Plan Discussed with:   Anesthesia Plan Comments:         Anesthesia Quick Evaluation  

## 2012-08-29 NOTE — Anesthesia Procedure Notes (Signed)
Procedure Name: Intubation Date/Time: 08/29/2012 3:09 PM Performed by: Luster Landsberg Pre-anesthesia Checklist: Patient identified, Emergency Drugs available, Suction available and Patient being monitored Patient Re-evaluated:Patient Re-evaluated prior to inductionOxygen Delivery Method: Circle system utilized Preoxygenation: Pre-oxygenation with 100% oxygen Intubation Type: IV induction Ventilation: Mask ventilation without difficulty Laryngoscope Size: Mac and 3 Grade View: Grade II Tube type: Oral Tube size: 7.0 mm Number of attempts: 1 Airway Equipment and Method: Stylet and LTA kit utilized Placement Confirmation: ETT inserted through vocal cords under direct vision,  positive ETCO2 and breath sounds checked- equal and bilateral Secured at: 24 cm Tube secured with: Tape Dental Injury: Teeth and Oropharynx as per pre-operative assessment

## 2012-08-30 LAB — GLUCOSE, CAPILLARY: Glucose-Capillary: 211 mg/dL — ABNORMAL HIGH (ref 70–99)

## 2012-08-30 MED ORDER — PNEUMOCOCCAL VAC POLYVALENT 25 MCG/0.5ML IJ INJ
0.5000 mL | INJECTION | INTRAMUSCULAR | Status: AC
  Administered 2012-08-30: 0.5 mL via INTRAMUSCULAR
  Filled 2012-08-30: qty 0.5

## 2012-08-30 NOTE — Op Note (Signed)
NAMEDOYCE, SALING            ACCOUNT NO.:  [id number]  MEDICAL RECORD NO.:  [id number]  LOCATION:  [id number]                        FACILITY:  [facility]  PHYSICIAN:  Hilda Lias, [name]   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  PROCEDURE:  Bilateral 3, 4, 5 laminectomy, foraminotomy, decompression of thecal sac.  Microscope.  SURGEON:  Hilda Lias, MD  ASSISTANT:  Dr. Lovell Sheehan.  CLINICAL HISTORY:  The patient is admitted because of back pain racing to both legs.  X-ray showed that she has severe stenosis at level of L4- 5 and moderate at level of L3-4.  Surgery was advised.  The patient knew the risk of the surgery.  Also after the patient was intubated and positioned in a prone manner, we found that he has midline incision from previous resection of lipoma.  The patient still has quite a bit of large lipoma.  DESCRIPTION OF PROCEDURE:  The patient was taken to the OR and after intubation, he was positioned in a prone manner.  The back was cleaned with Betadine and DuraPrep.  Drapes were applied.  Resection of the lipoma approximately 2 x 2-1/2 inches was made.  Then muscle was retracted bilaterally at the level of L4-5.  X-rays showed that we were right at L4-5.  From then on, we removed the spinous process of L4-L5 and we drilled the lamina of L4-L5.  The patient had quite a bit of thickening of the yellow ligament to the point that there was quite a bit of narrowing of the thecal sac.  Foraminotomy was done with good space for the L4 and L5.  Then with working with the _[place]_________ removed a part of the yellow ligament.  We found that area was quite narrow and because of  that, we decided to proceed with laminectomy and decompression of the area between 3-4 with foraminotomy to decompress the L3-L4.  Again at this level, we found quite a bit of thickening on the yellow ligament.  Feeling with the hook above the L3, there was plenty of space.  Then, we brought the microscope.  Using the drill, as well as the 2 and 3 mm Kerrison punch, we decompressed the L3, L4, L5, and S1 nerve root bilaterally.  Valsalva maneuver was negative.  Then, the area was irrigated.  Hemovac was left in the epidural space and the wound was closed with Vicryl and staples.         ______________________________ Hilda Lias, [name]    EB/MEDQ  D:  08/29/2012  T:  08/30/2012  Job:  [id number]

## 2012-08-30 NOTE — Plan of Care (Signed)
Problem: Consults Goal: Diagnosis - Spinal Surgery Lumbar Laminectomy (Complex)     

## 2012-08-30 NOTE — Clinical Social Work Note (Signed)
CSW consulted for SNF placement. PT with no PT f/u recommendaitons noted. CSW signing off as no other CSW needs identified at this time. Please re-consult if CSW needs arise.  Dellie Burns, MSW, LCSWA [phone] (Weekends 8:00am-4:30pm)

## 2012-08-30 NOTE — Evaluation (Signed)
Physical Therapy Evaluation Patient Details Name: [name] MRN: [id number] DOB: 1941/07/09 Today's Date: 08/30/2012 Time: 1308-6578 PT Time Calculation (min): 25 min  PT Assessment / Plan / Recommendation Clinical Impression  72 yo adm for L4-5 laminectomy. Pt will benefit from PT to incr safety and independence with mobility while adhering to back precautions. See problem list below.     PT Assessment  Patient needs continued PT services    Follow Up Recommendations  No PT follow up;Supervision - Intermittent    Does the patient have the potential to tolerate intense rehabilitation      Barriers to Discharge None      Equipment Recommendations  Rolling walker with 5" wheels    Recommendations for Other Services     Frequency Min 5X/week    Precautions / Restrictions Precautions Precautions: Back Precaution Booklet Issued: Yes (comment)   Pertinent Vitals/Pain Back pain 5/10 with activity; repositioned      Mobility  Bed Mobility Bed Mobility: Rolling Left;Left Sidelying to Sit;Sitting - Scoot to Edge of Bed Rolling Left: 5: Supervision;With rail Left Sidelying to Sit: 4: Min guard;With rails;HOB flat Sitting - Scoot to Edge of Bed: 5: Supervision Details for Bed Mobility Assistance: vc for technique to maintain back precautions Transfers Transfers: Sit to Stand;Stand to Sit Sit to Stand: 4: Min assist;With upper extremity assist;From bed;From elevated surface Stand to Sit: 4: Min guard;With upper extremity assist;With armrests;To chair/3-in-1 Details for Transfer Assistance: bed elevated to simulate home environment; vc for safe use of RW; steady assist as come to stand Ambulation/Gait Ambulation/Gait Assistance: 4: Min assist Ambulation Distance (Feet): 120 Feet Assistive device: Rolling walker Ambulation/Gait Assistance Details: vc for safe/proper use of RW; vc for upright posture (especially head up/neck extension) Gait Pattern: Step-through  pattern;Trunk flexed Gait velocity: decr         PT Diagnosis: Difficulty walking;Acute pain  PT Problem List: Decreased mobility;Decreased knowledge of use of DME;Decreased knowledge of precautions;Pain PT Treatment Interventions: DME instruction;Gait training;Stair training;Functional mobility training;Therapeutic activities;Patient/family education   PT Goals Acute Rehab PT Goals PT Goal Formulation: With patient Time For Goal Achievement: 09/02/12 Potential to Achieve Goals: Good Pt will Roll Supine to Left Side: with modified independence PT Goal: Rolling Supine to Left Side - Progress: Goal set today Pt will go Supine/Side to Sit: with modified independence;with HOB 0 degrees PT Goal: Supine/Side to Sit - Progress: Goal set today Pt will go Sit to Supine/Side: with modified independence;with HOB 0 degrees PT Goal: Sit to Supine/Side - Progress: Goal set today Pt will go Sit to Stand: with supervision;with upper extremity assist PT Goal: Sit to Stand - Progress: Goal set today Pt will Ambulate: >150 feet;with supervision;with least restrictive assistive device PT Goal: Ambulate - Progress: Goal set today Pt will Go Up / Down Stairs: 6-9 stairs;with rail(s);with min assist PT Goal: Up/Down Stairs - Progress: Goal set today Additional Goals Additional Goal #1: pt will verbalize and adhere to back precautions PT Goal: Additional Goal #1 - Progress: Goal set today  Visit Information  Last PT Received On: 08/30/12 Assistance Needed: +1    Subjective Data  Patient Stated Goal: Return home with son's assist   Prior Functioning  Home Living Lives With: Son Available Help at Discharge: Family;Available 24 hours/day Type of Home: House Home Access: Stairs to enter [employer] of Steps: 6 Entrance Stairs-Rails: Right;Left Home Layout: One level Bathroom Shower/Tub: Forensic scientist: Standard Bathroom Accessibility: No Home Adaptive  Equipment: Straight cane;Quad cane  Prior Function Level of Independence: Independent Able to Take Stairs?: Reciprically Communication Communication: No difficulties    Cognition  Cognition Overall Cognitive Status: Appears within functional limits for tasks assessed/performed Arousal/Alertness: Awake/alert Orientation Level: Oriented X4 / Intact Behavior During Session: [facility] for tasks performed    Extremity/Trunk Assessment Right Lower Extremity Assessment RLE ROM/Strength/Tone: [facility] for tasks assessed Left Lower Extremity Assessment LLE ROM/Strength/Tone: Deficits (as PTA per pt) LLE ROM/Strength/Tone Deficits: reports Lt knee buckled PTA "at times"; has done it for years (no buckling during exam) Trunk Assessment Trunk Assessment: Normal       End of Session PT - End of Session Equipment Utilized During Treatment: Gait belt Activity Tolerance: Patient tolerated treatment well Patient left: in chair;with call bell/phone within reach Nurse Communication: Mobility status  GP     [name] 08/30/2012, 3:59 PM Pager [phone]

## 2012-08-30 NOTE — Progress Notes (Signed)
Subjective: Patient reports He is overall done fairly well this pain perspective no leg pain back pain is managed  Objective: Vital signs in last 24 hours: Temp:  [97.2 F (36.2 C)-98 F (36.7 C)] 97.2 F (36.2 C) (02/22 0614) Pulse Rate:  [40-69] 67 (02/22 0614) Resp:  [10-20] 11 (02/22 0412) BP: (129-175)/(70-94) 129/70 mmHg (02/22 0614) SpO2:  [99 %-100 %] 100 % (02/22 0614) Weight:  [74.9 kg (165 lb 2 oz)] 74.9 kg (165 lb 2 oz) (02/21 2000)  Intake/Output from previous day: 02/21 0701 - 02/22 0700 In: 1850 [I.V.:1850] Out: 1950 [Urine:1750; Drains:50; Blood:150] Intake/Output this shift:    Strength is 5 out of 5 wound is clean and dry  Lab Results: No results found for this basename: WBC, HGB, HCT, PLT,  in the last 72 hours BMET No results found for this basename: NA, K, CL, CO2, GLUCOSE, BUN, CREATININE, CALCIUM,  in the last 72 hours  Studies/Results: Dg Lumbar Spine 2-3 Views  08/29/2012  *RADIOLOGY REPORT*  Clinical Data: L4-L5 laminectomies.  LUMBAR SPINE - 2-3 VIEW  Comparison: 02/08/2012 MRI.  Findings: 2 lateral views.  The first, labeled 1557 hours.  This demonstrates surgery devices projecting posterior to the L4-L5, L3- L4, and L2-L3 interspaces (presuming nomenclature of the 02/08/2012 MRI with a diminutive S1-S2 intervertebral disc).  The second image, labeled 1625 hours, demonstrates surgical devices posterior to the L2-L3 interspace and the L5 vertebral body or L4- L5 interspace.  IMPRESSION: Intraoperative localization.   Original Report Authenticated By: Jeronimo Greaves, [name]     Assessment/Plan: Progressive mobilization today with physical therapy we'll stop his PCA CL he tolerates oral analgesics. Drain output was still a little bit high so we'll continue that in the 24 hours  LOS: 1 day     Chanah Tidmore P 08/30/2012, 8:38 AM

## 2012-08-31 LAB — GLUCOSE, CAPILLARY

## 2012-08-31 MED ORDER — OXYCODONE-ACETAMINOPHEN 5-325 MG PO TABS: 1.0000 | ORAL_TABLET | ORAL | Status: DC | PRN

## 2012-08-31 MED ORDER — DIAZEPAM 5 MG PO TABS: 5.0000 mg | ORAL_TABLET | Freq: Four times a day (QID) | ORAL | Status: DC | PRN

## 2012-08-31 NOTE — Progress Notes (Signed)
Physical Therapy Treatment Patient Details Name: [name] MRN: [id number] DOB: [birth year] Today's Date: 08/31/2012 Time: 4098-1191 PT Time Calculation (min): 25 min  PT Assessment / Plan / Recommendation Comments on Treatment Session  Pt moving well.      Follow Up Recommendations  No PT follow up;Supervision - Intermittent     Does the patient have the potential to tolerate intense rehabilitation     Barriers to Discharge        Equipment Recommendations  Rolling walker with 5" wheels    Recommendations for Other Services    Frequency Min 5X/week   Plan Discharge plan remains appropriate    Precautions / Restrictions Precautions Precautions: Back Precaution Comments: Pt unable to verbalize precautions but did well adhering to precautions with functional mobility Restrictions Weight Bearing Restrictions: No       Mobility  Bed Mobility Bed Mobility: Rolling Right;Right Sidelying to Sit;Sitting - Scoot to Edge of Bed Rolling Right: 5: Supervision Right Sidelying to Sit: 5: Supervision;HOB flat Sitting - Scoot to Edge of Bed: 5: Supervision Details for Bed Mobility Assistance: Pt performed with safe/proper technique Transfers Transfers: Sit to Stand;Stand to Sit Sit to Stand: 4: Min guard;With upper extremity assist;From bed Stand to Sit: 5: Supervision;With upper extremity assist;With armrests;To chair/3-in-1 Details for Transfer Assistance: cues for hand placement Ambulation/Gait Ambulation/Gait Assistance: 5: Supervision Ambulation Distance (Feet): 300 Feet Assistive device: Rolling walker Ambulation/Gait Assistance Details: (S) for safety Gait Pattern: Step-through pattern Stairs: Yes Stairs Assistance: 4: Min guard Stairs Assistance Details (indicate cue type and reason): cues for sequencing & encouragement to use step-to pattern vs step-through Stair Management Technique: Two rails;Step to pattern;Forwards Number of Stairs: 5 Wheelchair  Mobility Wheelchair Mobility: No      PT Goals Acute Rehab PT Goals Time For Goal Achievement: 09/02/12 Potential to Achieve Goals: Good Pt will Roll Supine to Left Side: with modified independence Pt will go Supine/Side to Sit: with modified independence;with HOB 0 degrees PT Goal: Supine/Side to Sit - Progress: Progressing toward goal Pt will go Sit to Supine/Side: with modified independence;with HOB 0 degrees Pt will go Sit to Stand: with supervision;with upper extremity assist PT Goal: Sit to Stand - Progress: Progressing toward goal Pt will Ambulate: >150 feet;with supervision;with least restrictive assistive device PT Goal: Ambulate - Progress: Progressing toward goal Pt will Go Up / Down Stairs: 6-9 stairs;with rail(s);with min assist PT Goal: Up/Down Stairs - Progress: Met Additional Goals Additional Goal #1: pt will verbalize and adhere to back precautions PT Goal: Additional Goal #1 - Progress: Progressing toward goal  Visit Information  Last PT Received On: 08/31/12 Assistance Needed: +1    Subjective Data      Cognition  Cognition Overall Cognitive Status: Appears within functional limits for tasks assessed/performed Arousal/Alertness: Awake/alert Orientation Level: Oriented X4 / Intact Behavior During Session: [facility] for tasks performed    Balance     End of Session PT - End of Session Equipment Utilized During Treatment: Gait belt Activity Tolerance: Patient tolerated treatment well Patient left: in chair;with call bell/phone within reach Nurse Communication: Mobility status    Verdell Face, [place] [phone] 08/31/2012

## 2012-08-31 NOTE — Evaluation (Signed)
Occupational Therapy Evaluation Patient Details Name: [name] MRN: [id number] DOB: 1941/03/15 Today's Date: 08/31/2012 Time: 3086-5784 OT Time Calculation (min): 20 min  OT Assessment / Plan / Recommendation Clinical Impression  Pt s/p L4-5 lami. Overall at sup leve. Education completed. will sign off.    OT Assessment  Patient does not need any further OT services    Follow Up Recommendations  No OT follow up;Supervision - Intermittent    Barriers to Discharge      Equipment Recommendations  None recommended by OT    Recommendations for Other Services    Frequency       Precautions / Restrictions Precautions Precautions: Back Precaution Booklet Issued: Yes (comment) Precaution Comments: Pt unable to verbalize precautions but did well adhering to precautions with functional mobility   Pertinent Vitals/Pain See vitals    ADL  Grooming: Performed;Wash/dry hands;Supervision/safety Where Assessed - Grooming: Unsupported standing Upper Body Bathing: Simulated;Set up Where Assessed - Upper Body Bathing: Unsupported sitting Lower Body Bathing: Simulated;Supervision/safety Where Assessed - Lower Body Bathing: Unsupported sitting Upper Body Dressing: Performed;Set up Where Assessed - Upper Body Dressing: Unsupported sitting Lower Body Dressing: Performed;Supervision/safety Where Assessed - Lower Body Dressing: Unsupported sit to stand Toilet Transfer: Simulated;Supervision/safety Toilet Transfer Method: Sit to Barista:  (bed, chair) Tub/Shower Transfer: Engineer, manufacturing Method: Ambulating Equipment Used: Rolling walker Transfers/Ambulation Related to ADLs: supervision with RW ADL Comments: pt able to cross ankles over knees for LB ADL tasks.      OT Diagnosis:    OT Problem List:   OT Treatment Interventions:     OT Goals    Visit Information  Last OT Received On: 08/31/12 Assistance Needed: +1     Subjective Data      Prior Functioning     Home Living Lives With: Son Available Help at Discharge: Family;Available 24 hours/day Type of Home: House Home Access: Stairs to enter [employer] of Steps: 6 Entrance Stairs-Rails: Right;Left Home Layout: One level Bathroom Shower/Tub: Forensic scientist: Standard Bathroom Accessibility: No Home Adaptive Equipment: Straight cane;Quad cane Prior Function Level of Independence: Independent Able to Take Stairs?: Reciprically         Vision/Perception     Cognition  Cognition Overall Cognitive Status: Appears within functional limits for tasks assessed/performed Arousal/Alertness: Awake/alert Orientation Level: Oriented X4 / Intact Behavior During Session: [facility] for tasks performed    Extremity/Trunk Assessment Right Upper Extremity Assessment RUE ROM/Strength/Tone: [facility] for tasks assessed Left Upper Extremity Assessment LUE ROM/Strength/Tone: WFL for tasks assessed     Mobility Bed Mobility Bed Mobility: Rolling Right;Right Sidelying to Sit;Sitting - Scoot to Edge of Bed Rolling Right: 5: Supervision Right Sidelying to Sit: 5: Supervision;HOB flat Sitting - Scoot to Edge of Bed: 5: Supervision Details for Bed Mobility Assistance: Pt performed with safe/proper technique Transfers Transfers: Sit to Stand;Stand to Sit Sit to Stand: 4: Min guard;With upper extremity assist;From bed Stand to Sit: 5: Supervision;With upper extremity assist;With armrests;To chair/3-in-1 Details for Transfer Assistance: cues for hand placement     Exercise     Balance     End of Session OT - End of Session Equipment Utilized During Treatment:  (RW) Activity Tolerance: Patient tolerated treatment well Patient left: in chair;with call bell/phone within reach Nurse Communication: Mobility status  GO   08/31/2012 Cipriano Mile OTR/L Pager [phone] Office [phone]   Cipriano Mile 08/31/2012, 4:21 PM

## 2012-08-31 NOTE — Discharge Summary (Signed)
Physician Discharge Summary  Patient ID: [name] MRN: [id number] DOB/AGE: Oct 10, 1940 72 y.o.  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 *   Discharged Condition: good  Hospital Course: Dr. Jeral Fruit admitted the patient to [facility] on 08/29/2012. On that day performed at L3-4, L4-5 and L5-S1 laminectomy.  The patient's postoperative course was unremarkable. He requested discharge to home to 2314. The patient was given oral and written discharge instructions. All his questions were answered.  Consults: None Significant Diagnostic Studies: None Treatments: L3-4, L4-5 and L5-S1 laminectomy Discharge Exam: Blood pressure 115/64, pulse 102, temperature 98.2 F (36.8 C), temperature source Oral, resp. rate 18, height 5\' 10"  (1.778 m), weight 74.9 kg (165 lb 2 oz), SpO2 100.00%. The patient is alert and pleasant. His dressing is clean and dry. His strength is normal.  Disposition: Home  Discharge Orders   Future Appointments Provider Department Dept Phone   11/17/2012 9:45 AM Malissa Hippo, MD [facility] [phone]   Future Orders Complete By Expires     Call MD for:  difficulty breathing, headache or visual disturbances  As directed     Call MD for:  extreme fatigue  As directed     Call MD for:  hives  As directed     Call MD for:  persistant dizziness or light-headedness  As directed     Call MD for:  persistant nausea and vomiting  As directed     Call MD for:  redness, tenderness, or signs of infection (pain, swelling, redness, odor or green/yellow discharge around incision site)  As directed     Call MD for:  severe uncontrolled pain  As directed     Call MD for:  temperature >100.4  As directed     Diet - low sodium heart healthy  As directed     Discharge instructions  As directed     Comments:      Call [phone] for a followup appointment.    Driving Restrictions  As directed     Comments:      Do not drive for 2 weeks.    Increase activity slowly  As directed     Lifting restrictions  As directed     Comments:      Do not lift more than 5 pounds. No excessive bending or twisting.    May shower / Bathe  As directed     Comments:      He may shower after the pain she is removed 3 days after surgery. Leave the incision alone.    Remove dressing in 48 hours  As directed     Comments:      Your stitches are under the scan and will dissolve by themselves. The Steri-Strips will fall off after you take a few showers. Do not rub back or pick at the wound, Leave the wound alone.        Medication List    TAKE these medications       aspirin 81 MG EC tablet  Take 81 mg by mouth daily.     DEXILANT 60 MG capsule  Generic drug:  dexlansoprazole  Take 60 mg by mouth 2 (two) times daily.     diazepam 5 MG tablet  Commonly known as:  VALIUM  Take 1 tablet (5 mg  total) by mouth every 6 (six) hours as needed.     glyBURIDE-metformin 5-500 MG per tablet  Commonly known as:  GLUCOVANCE  Take 2 tablets by mouth 2 (two) times daily with a meal.     metoprolol succinate 50 MG 24 hr tablet  Commonly known as:  TOPROL-XL  Take 50 mg by mouth daily.     omeprazole 40 MG capsule  Commonly known as:  PRILOSEC  Take 40 mg by mouth daily.     ONGLYZA 5 MG Tabs tablet  Generic drug:  saxagliptin HCl  Take 5 mg by mouth daily.     oxyCODONE-acetaminophen 5-325 MG per tablet  Commonly known as:  PERCOCET/ROXICET  Take 1-2 tablets by mouth every 4 (four) hours as needed.     ramipril 10 MG tablet  Commonly known as:  ALTACE  Take 10 mg by mouth daily.     simvastatin 20 MG tablet  Commonly known as:  ZOCOR  Take 20 mg by mouth every evening.     Tamsulosin HCl 0.4 MG Caps  Commonly known as:  FLOMAX  Take 0.4 mg by mouth  daily.         SignedCristi Loron 08/31/2012, 10:41 AM

## 2012-09-01 ENCOUNTER — Encounter (HOSPITAL_COMMUNITY): Payer: Self-pay | Admitting: Neurosurgery

## 2012-09-01 NOTE — Progress Notes (Signed)
09/01/2012 1630 No NCM needs identified. Isidoro Donning RN CCM Case Mgmt phone [phone]

## 2012-09-01 NOTE — Progress Notes (Signed)
Utilization review completed.  

## 2012-09-06 ENCOUNTER — Encounter (HOSPITAL_COMMUNITY): Payer: Self-pay

## 2012-09-06 ENCOUNTER — Emergency Department (HOSPITAL_COMMUNITY)
Admission: EM | Admit: 2012-09-06 | Discharge: 2012-09-06 | Disposition: A | Discharge status: Home / Self Care | Payer: Medicare Other | Attending: Emergency Medicine | Admitting: Emergency Medicine

## 2012-09-06 DIAGNOSIS — Z8701 Personal history of pneumonia (recurrent): Secondary | ICD-10-CM | POA: Insufficient documentation

## 2012-09-06 DIAGNOSIS — Z8739 Personal history of other diseases of the musculoskeletal system and connective tissue: Secondary | ICD-10-CM | POA: Insufficient documentation

## 2012-09-06 DIAGNOSIS — R3915 Urgency of urination: Secondary | ICD-10-CM | POA: Insufficient documentation

## 2012-09-06 DIAGNOSIS — H9192 Unspecified hearing loss, left ear: Secondary | ICD-10-CM

## 2012-09-06 DIAGNOSIS — Z87448 Personal history of other diseases of urinary system: Secondary | ICD-10-CM | POA: Insufficient documentation

## 2012-09-06 DIAGNOSIS — K219 Gastro-esophageal reflux disease without esophagitis: Secondary | ICD-10-CM | POA: Insufficient documentation

## 2012-09-06 DIAGNOSIS — Z9889 Other specified postprocedural states: Secondary | ICD-10-CM

## 2012-09-06 DIAGNOSIS — Z8711 Personal history of peptic ulcer disease: Secondary | ICD-10-CM | POA: Insufficient documentation

## 2012-09-06 DIAGNOSIS — I10 Essential (primary) hypertension: Secondary | ICD-10-CM | POA: Insufficient documentation

## 2012-09-06 DIAGNOSIS — Z8619 Personal history of other infectious and parasitic diseases: Secondary | ICD-10-CM | POA: Insufficient documentation

## 2012-09-06 DIAGNOSIS — Z860100 Personal history of colon polyps, unspecified: Secondary | ICD-10-CM | POA: Insufficient documentation

## 2012-09-06 DIAGNOSIS — Z79899 Other long term (current) drug therapy: Secondary | ICD-10-CM | POA: Insufficient documentation

## 2012-09-06 DIAGNOSIS — E785 Hyperlipidemia, unspecified: Secondary | ICD-10-CM | POA: Insufficient documentation

## 2012-09-06 DIAGNOSIS — H9190 Unspecified hearing loss, unspecified ear: Secondary | ICD-10-CM | POA: Insufficient documentation

## 2012-09-06 DIAGNOSIS — E119 Type 2 diabetes mellitus without complications: Secondary | ICD-10-CM | POA: Insufficient documentation

## 2012-09-06 DIAGNOSIS — Z7982 Long term (current) use of aspirin: Secondary | ICD-10-CM | POA: Insufficient documentation

## 2012-09-06 MED ORDER — ANTIPYRINE-BENZOCAINE 5.4-1.4 % OT SOLN
3.0000 [drp] | Freq: Once | OTIC | Status: AC
  Administered 2012-09-06: 4 [drp] via OTIC
  Filled 2012-09-06: qty 10

## 2012-09-06 NOTE — ED Notes (Signed)
Pt states his hearing in his left ear is muffled.  Pt reports he hit the side of his head on a car door frame 2 weeks ago.  Pt was seen by neurologist at [facility] for [facility] back and had the dr look in his ear and states he didn't find anything at that time.  Pt states the muffled sound is getting worse.

## 2012-09-06 NOTE — ED Provider Notes (Signed)
History     CSN: [id number]  Arrival date & time 09/06/12  0215   First MD Initiated Contact with Patient 09/06/12 0240      Chief Complaint  Patient presents with  . Otalgia    (Consider location/radiation/quality/duration/timing/severity/associated sxs/prior treatment) HPI [name] is a 72 y.o. male who presents to the Emergency Department complaining of left ear muffled. He hit the side of his head and ear on the car door frame 2 weeks ago. He has had surgery on his back and is due to have the staples taken out on March 4th. He asked that I look at the back wound.  PCP Dr. Leandrew Koyanagi Neurosurgeon Dr. Jeral Fruit  Past Medical History  Diagnosis Date  . Hyperlipidemia Since 1995    takes Simvastatin daily  . Herpes   . Hypertension Since 1995    takes Metoprolol and Ramipril daily  . Sleep apnea     doesn't use CPAP  . Pneumonia     hx of;as a child  . Arthritis     fingers  . Chronic back pain   . GERD (gastroesophageal reflux disease)     takes Dexilant and Omeprazole daily  . History of gastric ulcer 25+yrs ago  . History of colon polyps   . Urinary urgency     takes Flomax daily  . Urinary frequency   . Diabetes mellitus Since 1995    Takes Glucovance and Onglyza daily    Past Surgical History  Procedure Laterality Date  . Cervical spine surgery    . Rotator cuff repair      Right  . Wrist surgery      Left  . Hand surgery      Right  . Penile prosthesis implant    . Back surgery  1962    Lumbar  . Bilateral cataract surgery    . Cardiac catheterization  02/16/10  . Colonoscopy    . Esophagogastroduodenoscopy (egd) with esophageal dilation    . Lumbar laminectomy/decompression microdiscectomy N/A 08/29/2012    Procedure: LUMBAR LAMINECTOMY/DECOMPRESSION MICRODISCECTOMY 1 LEVEL;  Surgeon: Karn Cassis, MD;  Location: [facility];  Service: Neurosurgery;  Laterality: N/A;  Lumbar four-five laminectomies    Family History  Problem Relation Age  of Onset  . Heart attack Father 25    MI  . Hypertension Father   . Heart attack Brother 50    MI  . Heart attack Paternal Uncle   . Diabetes Mother     History  Substance Use Topics  . Smoking status: Never Smoker   . Smokeless tobacco: Not on file  . Alcohol Use: Yes     Comment: occasionally      Review of Systems  Constitutional: Negative for fever.       10 Systems reviewed and are negative for acute change except as noted in the HPI.  HENT: Negative for congestion.        Muffled hearing  Eyes: Negative for discharge and redness.  Respiratory: Negative for cough and shortness of breath.   Cardiovascular: Negative for chest pain.  Gastrointestinal: Negative for vomiting and abdominal pain.  Musculoskeletal: Negative for back pain.       Back surgery  Skin: Negative for rash.  Neurological: Negative for syncope, numbness and headaches.  Psychiatric/Behavioral:       No behavior change.    Allergies  Aspirin  Home Medications   Current Outpatient Rx  Name  Route  Sig  Dispense  Refill  . aspirin 81 MG EC tablet   Oral   Take 81 mg by mouth daily.           Marland Kitchen dexlansoprazole (DEXILANT) 60 MG capsule   Oral   Take 60 mg by mouth 2 (two) times daily.         . diazepam (VALIUM) 5 MG tablet   Oral   Take 1 tablet (5 mg total) by mouth every 6 (six) hours as needed.   50 tablet   1   . glyBURIDE-metformin (GLUCOVANCE) 5-500 MG per tablet   Oral   Take 2 tablets by mouth 2 (two) times daily with a meal.          . metoprolol (TOPROL-XL) 50 MG 24 hr tablet   Oral   Take 50 mg by mouth daily.           Marland Kitchen omeprazole (PRILOSEC) 40 MG capsule   Oral   Take 40 mg by mouth daily.         Marland Kitchen oxyCODONE-acetaminophen (PERCOCET/ROXICET) 5-325 MG per tablet   Oral   Take 1-2 tablets by mouth every 4 (four) hours as needed.   100 tablet   0   . ramipril (ALTACE) 10 MG tablet   Oral   Take 10 mg by mouth daily.         . saxagliptin HCl  (ONGLYZA) 5 MG TABS tablet   Oral   Take 5 mg by mouth daily.         . simvastatin (ZOCOR) 20 MG tablet   Oral   Take 20 mg by mouth every evening.         . Tamsulosin HCl (FLOMAX) 0.4 MG CAPS   Oral   Take 0.4 mg by mouth daily.           BP 155/87  Pulse 78  Temp(Src) 98.7 F (37.1 C) (Oral)  Ht 5\' 10"  (1.778 m)  Wt 160 lb (72.576 kg)  BMI 22.96 kg/m2  SpO2 99%  Physical Exam  Constitutional: He is oriented to person, place, and time. He appears well-developed and well-nourished.  HENT:  Head: Normocephalic and atraumatic.  Right Ear: External ear normal.  Left Ear: External ear normal.  Nose: Nose normal.  Mouth/Throat: Oropharynx is clear and moist.  Eyes: EOM are normal. Pupils are equal, round, and reactive to light.  Neck: Normal range of motion.  Cardiovascular: Normal rate.   Pulmonary/Chest: Breath sounds normal.  Abdominal: Bowel sounds are normal.  Musculoskeletal:  Back with staples intact. No erythema. No swelling.  Neurological: He is alert and oriented to person, place, and time.    ED Course  Procedures (including critical care time)    MDM  Patient with muffled hearing loss. No lesions in the ear. Surgical wound healing well. Given aurlagan. Pt stable in ED with no significant deterioration in condition.The patient appears reasonably screened and/or stabilized for discharge and I doubt any other medical condition or other [facility] requiring further screening, evaluation, or treatment in the ED at this time prior to discharge.  MDM Reviewed: nursing note and vitals           Nicoletta Dress. Colon Branch, MD 09/06/12 [phone]

## 2012-10-02 ENCOUNTER — Ambulatory Visit (INDEPENDENT_AMBULATORY_CARE_PROVIDER_SITE_OTHER): Payer: Medicare Other | Admitting: Otolaryngology

## 2012-10-02 DIAGNOSIS — H9319 Tinnitus, unspecified ear: Secondary | ICD-10-CM

## 2012-10-02 DIAGNOSIS — H903 Sensorineural hearing loss, bilateral: Secondary | ICD-10-CM

## 2012-10-05 IMAGING — CR DG CHEST 2V
2 series · 2 of 2 positions shown · non-contrast
Comparison: CT chest dated 09/03/2011

CLINICAL DATA: Productive cough, sore throat

CHEST - 2 VIEW

[view not recorded (1 of 2)]
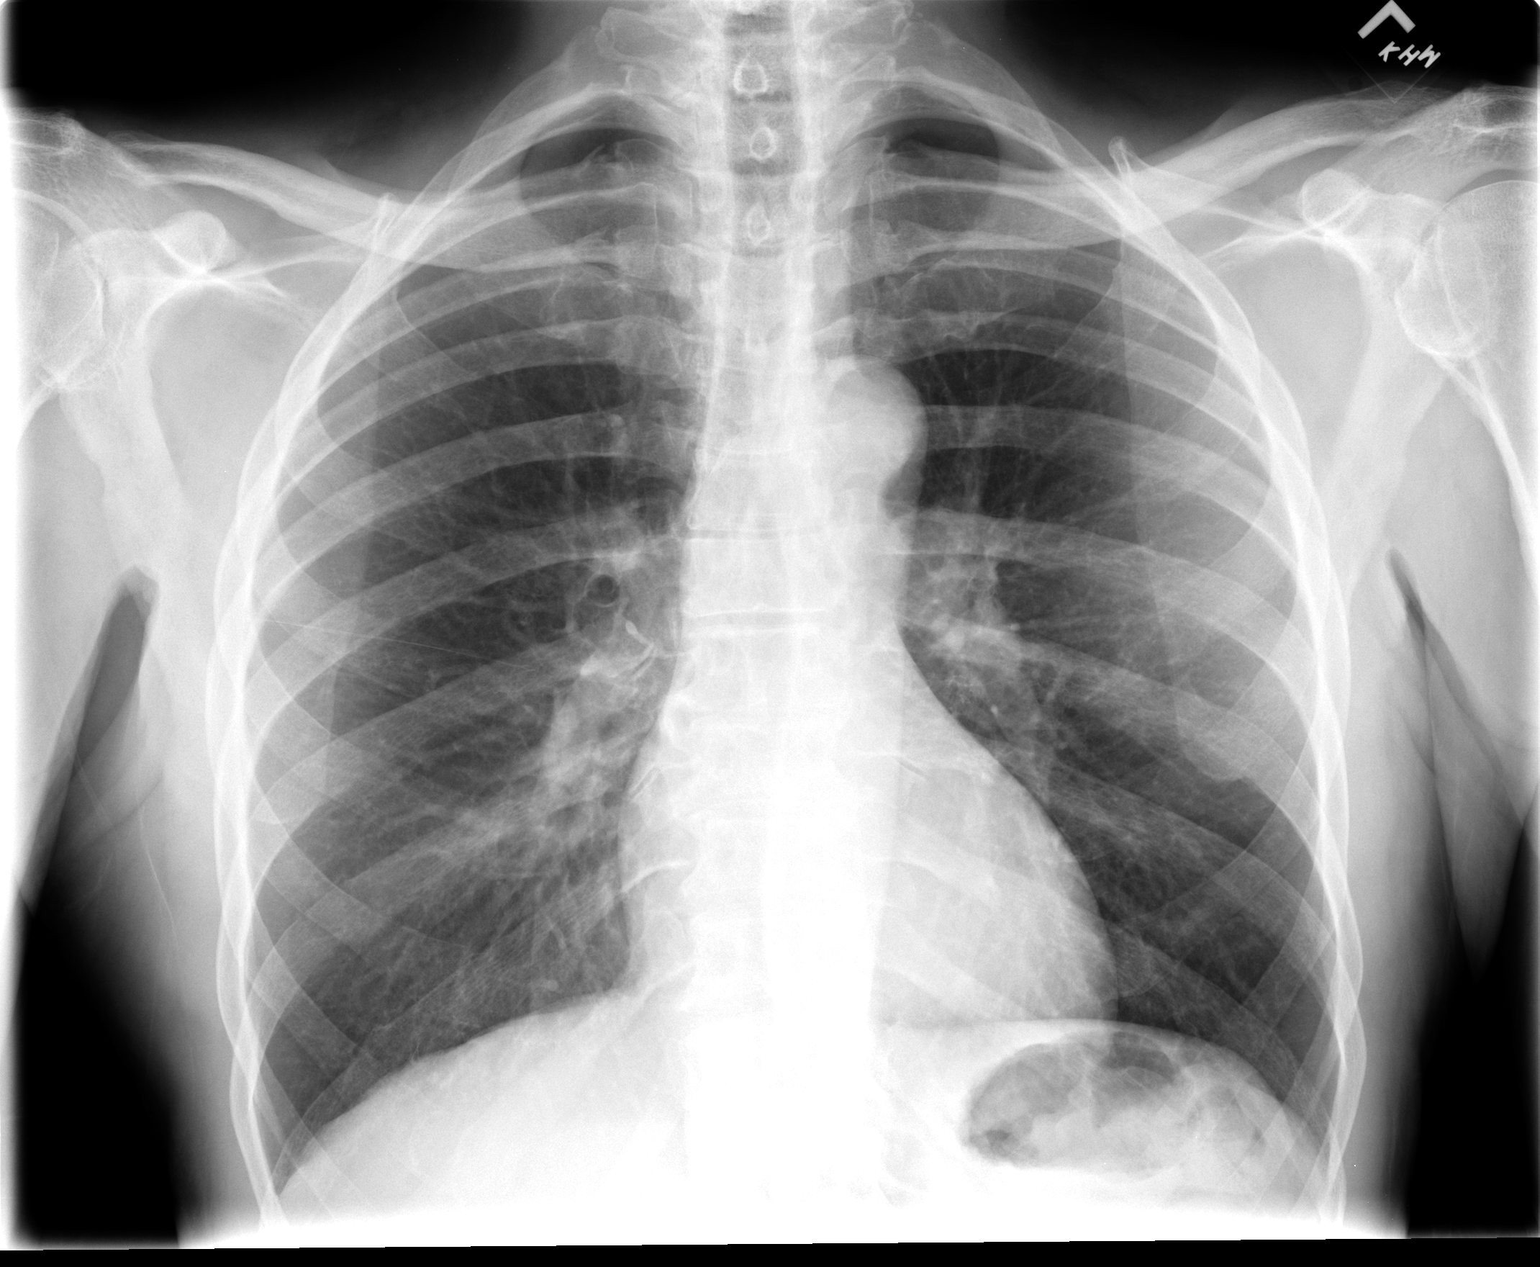

[view not recorded (2 of 2)]
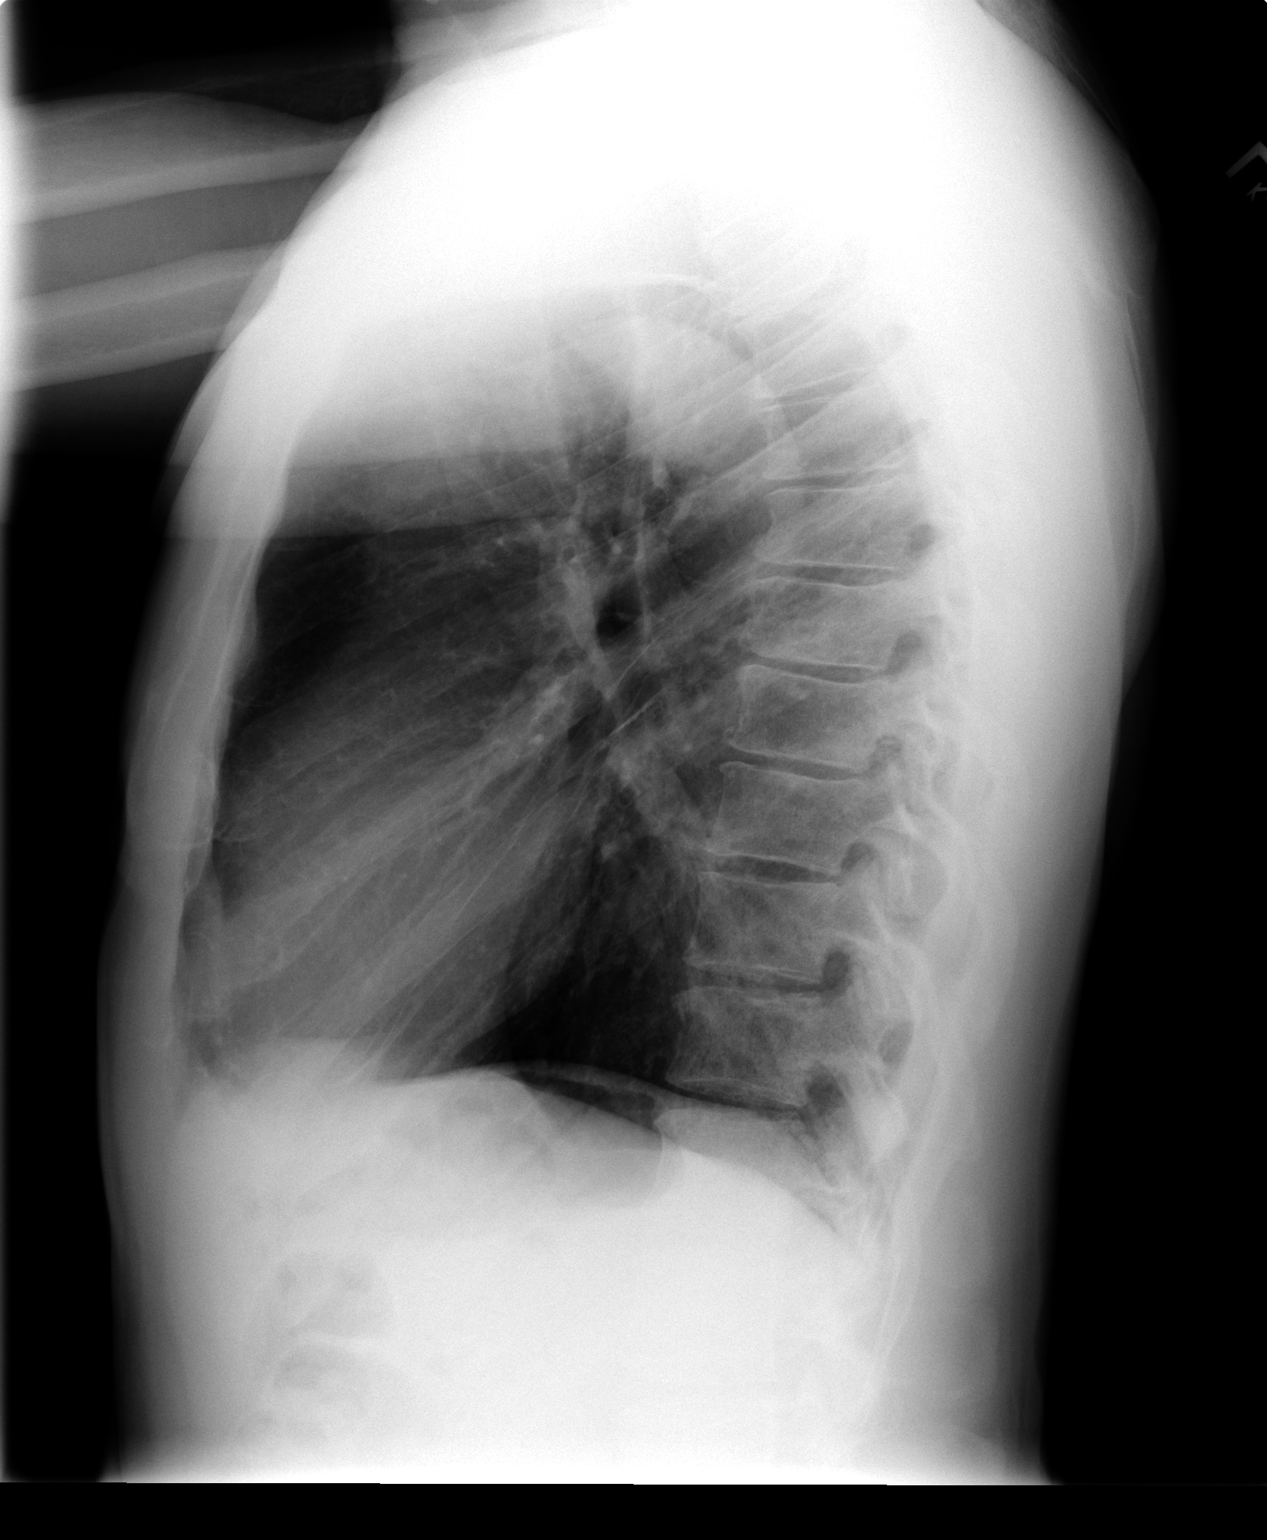

[2 of 2 positions shown; findings below may reference images not displayed]

FINDINGS: Lungs are clear.  No pleural effusion or pneumothorax.

Cardiomediastinal silhouette is within normal limits.

Mild degenerative changes of the visualized thoracolumbar spine.
IMPRESSION: No evidence of acute cardiopulmonary disease.

## 2012-11-17 ENCOUNTER — Ambulatory Visit (INDEPENDENT_AMBULATORY_CARE_PROVIDER_SITE_OTHER): Payer: Medicare Other | Admitting: Internal Medicine

## 2012-11-18 IMAGING — CT CT MAXILLOFACIAL W/O CM
3 series · 15 of 47 positions shown, 18 images · non-contrast
Comparison: None.

CLINICAL DATA: Intranasal mass.  The patient complains of a lump on
his nose.

CT MAXILLOFACIAL WITHOUT CONTRAST
TECHNIQUE: Multidetector CT imaging of the maxillofacial
structures was performed. Multiplanar CT image reconstructions were
also generated.

[Series 2: max soft 2.0 h31s · axial · 0.39mm/px · z∈[+30,+205]mm · 9 of 137 slices shown, 12 images]
[im 10/137  brain]
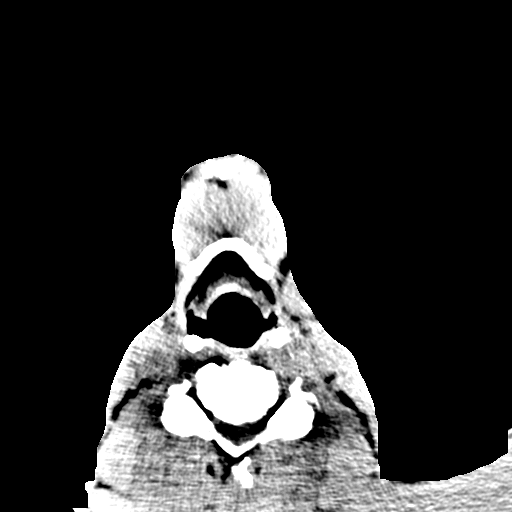
[im 10/137  bone]
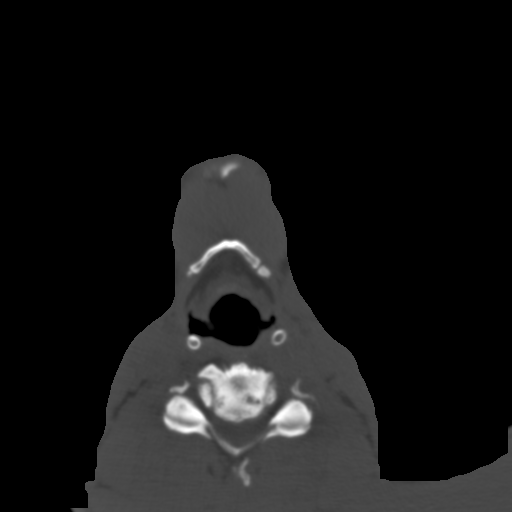
[im 24/137  bone]
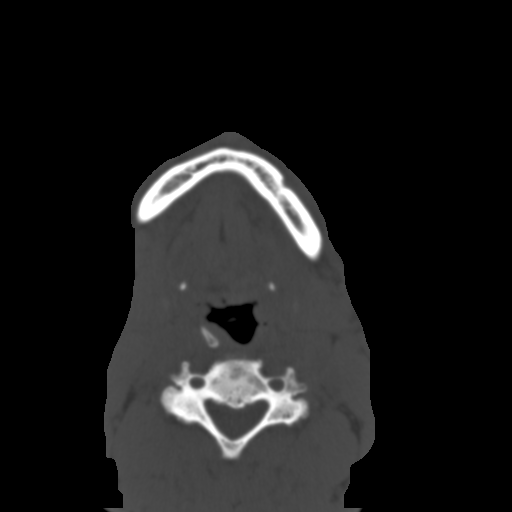
[im 38/137  bone]
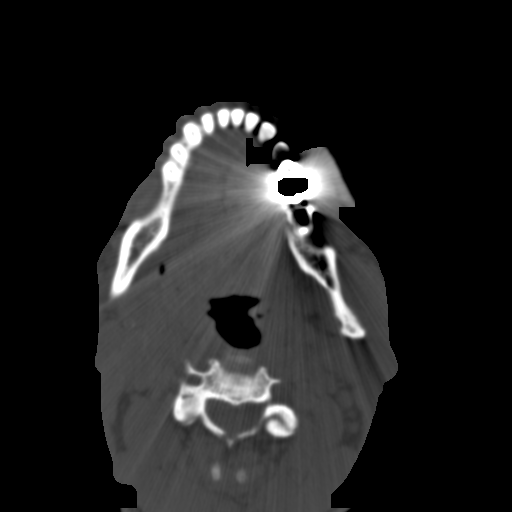
[im 52/137  bone]
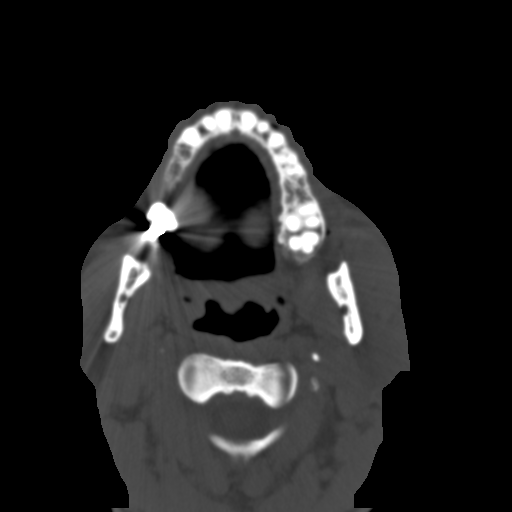
[im 71/137  brain]
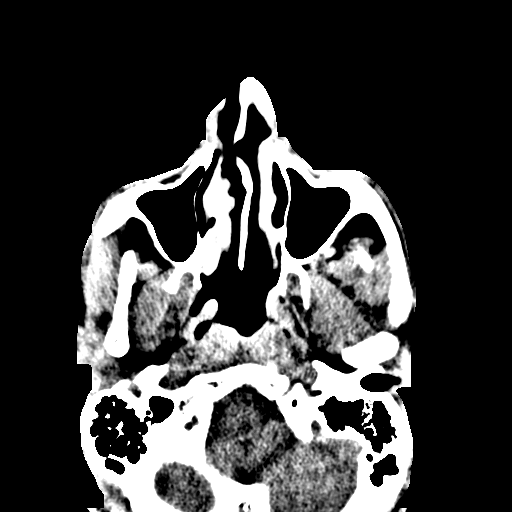
[im 71/137  bone]
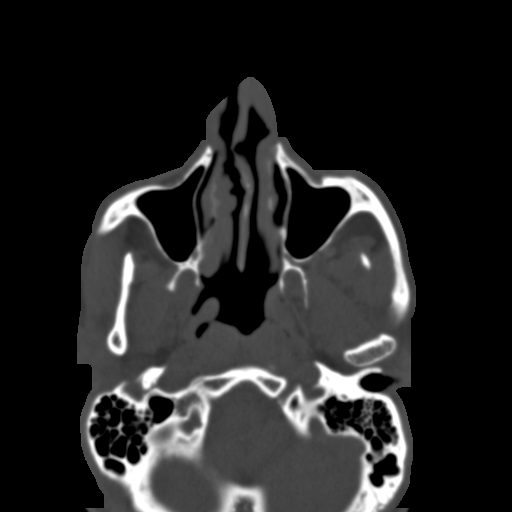
[im 85/137  bone]
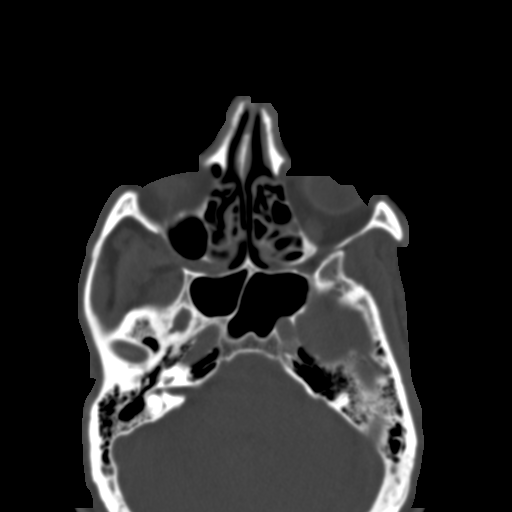
[im 99/137  bone]
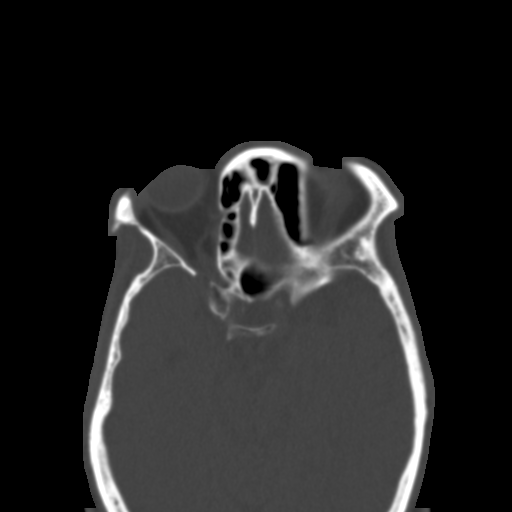
[im 113/137  bone]
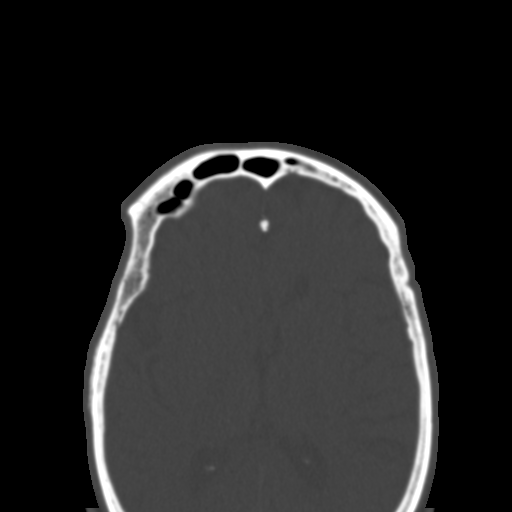
[im 127/137  brain]
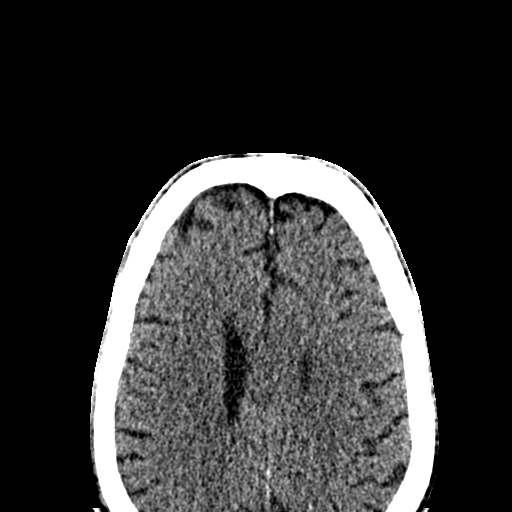
[im 127/137  bone]
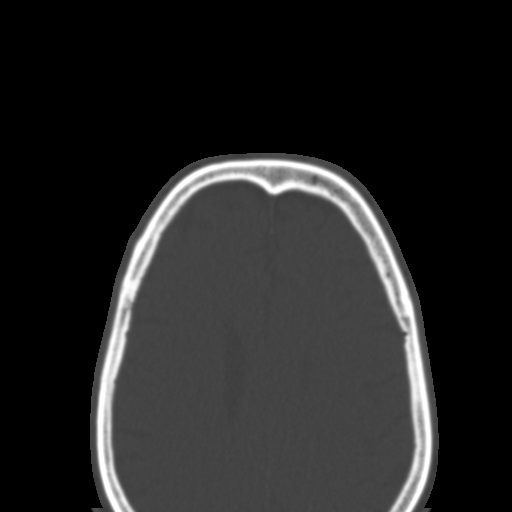

[Series 4: max st coronal · coronal · 0.29mm/px · 3 of 75 slices shown]
[im 25/75  bone]
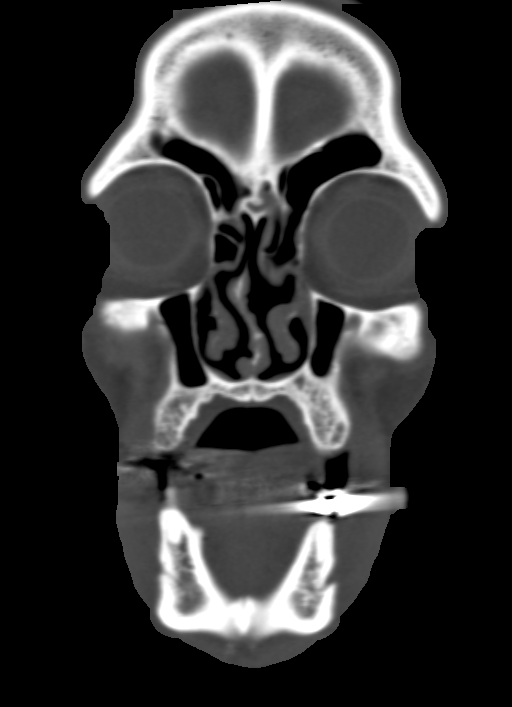
[im 33/75  bone]
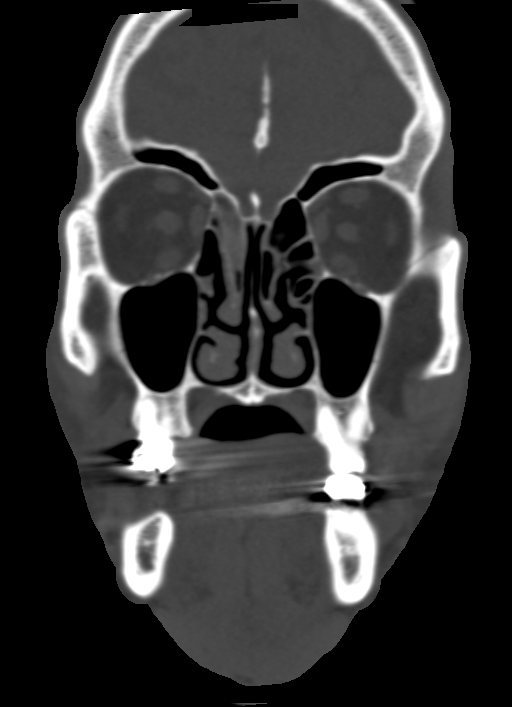
[im 42/75  bone]
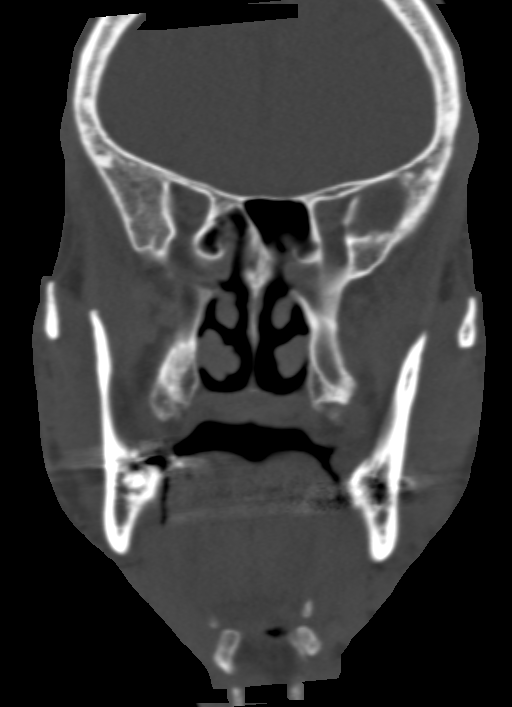

[Series 5: max st sagittal · sagittal · 0.37mm/px · 3 of 76 slices shown]
[im 26/76  bone]
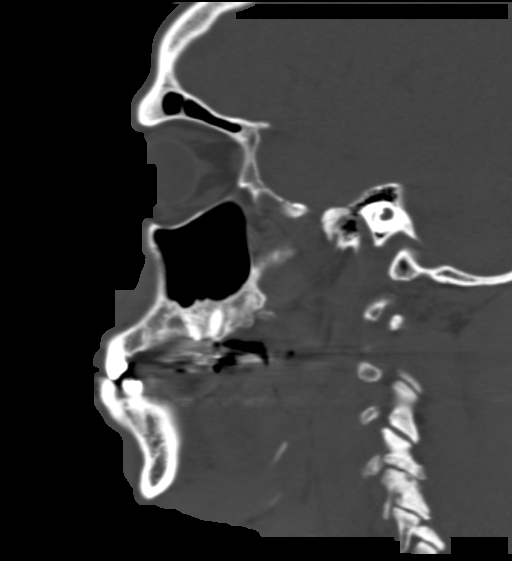
[im 38/76  bone]
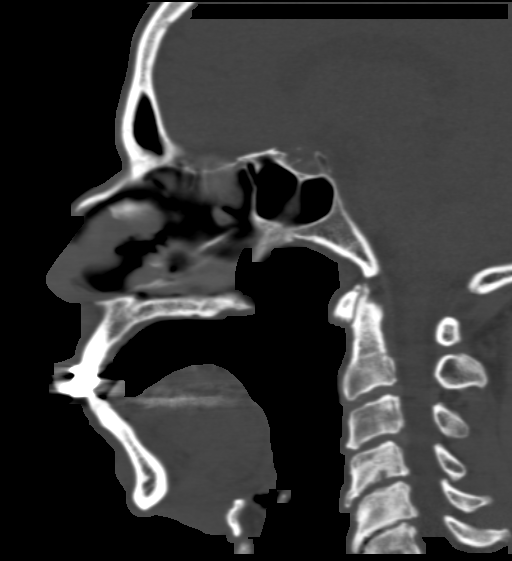
[im 51/76  bone]
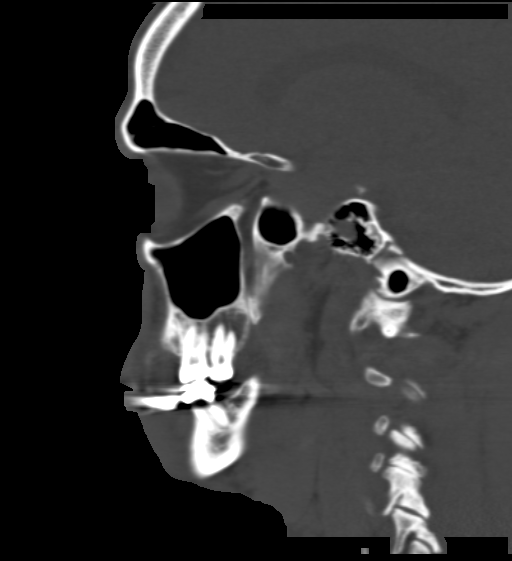

[15 of 47 positions shown; findings below may reference images not displayed]

FINDINGS: A remote left nasal bone fracture is evident.  There is
slight asymmetric thickening of the left cartilaginous portion of
the nose into the left nasal ala.  A discrete mass is not evident.
Anterior rightward nasal septal deviation of 7 mm is stable.

There is mild mucosal thickening covering the opening of the left
ostiomeatal complex.  The right ostiomeatal complex is patent.

Mild mucosal thickening is scattered throughout the ethmoid air
cells.

Limited imaging of the brain is unremarkable.

Moderate degenerative changes are noted in the upper cervical
spine.
IMPRESSION: 1.  Stable appearance of left nasal fracture and anterior nasal
septal deviation.
2.  Mild prominence of the cartilaginous and soft tissue component
of the anterior nose.  A discrete mass is not seen.  Focal
inflammation or even cellulitis is considered. Neoplasm is not
excluded.
3.  Mild sinus disease.

## 2013-01-21 ENCOUNTER — Other Ambulatory Visit (INDEPENDENT_AMBULATORY_CARE_PROVIDER_SITE_OTHER): Payer: Self-pay | Admitting: Internal Medicine

## 2013-01-21 ENCOUNTER — Encounter (INDEPENDENT_AMBULATORY_CARE_PROVIDER_SITE_OTHER): Payer: Self-pay | Admitting: Internal Medicine

## 2013-01-21 ENCOUNTER — Ambulatory Visit (INDEPENDENT_AMBULATORY_CARE_PROVIDER_SITE_OTHER): Payer: Medicare Other | Admitting: Internal Medicine

## 2013-01-21 VITALS — BP 142/60 | HR 52 | Temp 97.7°F | Ht 70.0 in | Wt 153.6 lb

## 2013-01-21 DIAGNOSIS — K219 Gastro-esophageal reflux disease without esophagitis: Secondary | ICD-10-CM

## 2013-01-21 DIAGNOSIS — K838 Other specified diseases of biliary tract: Secondary | ICD-10-CM

## 2013-01-21 LAB — COMPREHENSIVE METABOLIC PANEL
AST: 13 U/L (ref 0–37)
Alkaline Phosphatase: 44 U/L (ref 39–117)
BUN: 11 mg/dL (ref 6–23)
Creat: 0.95 mg/dL (ref 0.50–1.35)
Total Bilirubin: 0.5 mg/dL (ref 0.3–1.2)

## 2013-01-21 NOTE — Patient Instructions (Signed)
Labs today. OV with Dr. Karilyn Cota in 6 months.

## 2013-01-21 NOTE — Progress Notes (Signed)
Subjective:     Patient ID: [name], male   DOB: 11-Jun-1941, 72 y.o.   MRN: [id number]  HPI Here today for a scheduled visit. He tells me last week he did not feel well. He tells me in the morning his tongue is white. He will wipe it off. No white patches on tongue today. He also tells me he had diarrhea last week.  He is not having any diarrhea now.  He is having at least one BM a day. Appetite is perfect. There has been no weight loss. No dysphagia. No rectal bleeding or melena. No cough. Hx of dilated CBD. He has had normal liver enzymes with this.  For the most part he feels good. Sometimes if he bends over he will become dizzy Hx of H. Pylori and treated with Pylera.  08/2011 CT abdomen/pelvis with CM: IMPRESSION:  No definite peripancreatic inflammatory changes to confirm a  diagnosis of acute pancreatitis. No peripancreatic fluid  collection or abscess.  No evidence of bowel obstruction. Normal appendix. No intrahepatic or extrahepatic duct dilatation.  Recently had back surgery in [place] for back pain and claudication in February of this year.   08/09/2011 EGD/ED: No evidence of erosive or ulcerative esophagitis. Noncritical incompl;eter ring 4cm proximal to GE junction. Deformed but patient pylorus./ Esophagus dilated by passing 56 French Maloney dilator where no mucosal disruption noted.  Review of Systems  See hpi  Current Outpatient Prescriptions  Medication Sig Dispense Refill  . aspirin 81 MG EC tablet Take 81 mg by mouth daily.        Marland Kitchen glyBURIDE-metformin (GLUCOVANCE) 5-500 MG per tablet Take 2 tablets by mouth 2 (two) times daily with a meal.       . metoprolol (TOPROL-XL) 50 MG 24 hr tablet Take 50 mg by mouth daily.        . Multiple Vitamin (MULTIVITAMIN) tablet Take 1 tablet by mouth daily.      Marland Kitchen omeprazole (PRILOSEC) 40 MG capsule Take 40 mg by mouth daily.      Marland Kitchen oxyCODONE-acetaminophen (PERCOCET/ROXICET) 5-325 MG per tablet Take 1-2 tablets by  mouth every 4 (four) hours as needed.  100 tablet  0  . ramipril (ALTACE) 10 MG tablet Take 10 mg by mouth daily.      . simvastatin (ZOCOR) 20 MG tablet Take 20 mg by mouth every evening.      . Tamsulosin HCl (FLOMAX) 0.4 MG CAPS Take 0.4 mg by mouth daily.      . [DISCONTINUED] bismuth-metronidazole-tetracycline (PLYERA) 140-125-125 MG per capsule Take 3 capsules by mouth 4 (four) times daily -  before meals and at bedtime.       No current facility-administered medications for this visit.   Past Medical History  Diagnosis Date  . Hyperlipidemia Since 1995    takes Simvastatin daily  . Herpes   . Hypertension Since 1995    takes Metoprolol and Ramipril daily  . Sleep apnea     doesn't use CPAP  . Pneumonia     hx of;as a child  . Arthritis     fingers  . Chronic back pain   . GERD (gastroesophageal reflux disease)     takes Dexilant and Omeprazole daily  . History of gastric ulcer 25+yrs ago  . History of colon polyps   . Urinary urgency     takes Flomax daily  . Urinary frequency   . Diabetes mellitus Since 1995    Takes Glucovance and Onglyza daily  Past Surgical History  Procedure Laterality Date  . Cervical spine surgery    . Rotator cuff repair      Right  . Wrist surgery      Left  . Hand surgery      Right  . Penile prosthesis implant    . Back surgery  1962    Lumbar  . Bilateral cataract surgery    . Cardiac catheterization  02/16/10  . Colonoscopy    . Esophagogastroduodenoscopy (egd) with esophageal dilation    . Lumbar laminectomy/decompression microdiscectomy N/A 08/29/2012    Procedure: LUMBAR LAMINECTOMY/DECOMPRESSION MICRODISCECTOMY 1 LEVEL;  Surgeon: Karn Cassis, MD;  Location: [facility];  Service: Neurosurgery;  Laterality: N/A;  Lumbar four-five laminectomies   Allergies  Allergen Reactions  . Aspirin     REACTION: higher dosage upsets stomach       Objective:   Physical Exam  Filed Vitals:   01/21/13 1001  BP: 142/60  Pulse:  52  Temp: 97.7 F (36.5 C)  Height: 5\' 10"  (1.778 m)  Weight: 153 lb 9.6 oz (69.673 kg)  Alert and oriented. Skin warm and dry. Oral mucosa is moist.   . Sclera anicteric, conjunctivae is pink. Thyroid not enlarged. No cervical lymphadenopathy. Lungs clear. Heart regular rate and rhythm.  Abdomen is soft. Bowel sounds are positive. No hepatomegaly. No abdominal masses felt. No tenderness.  No edema to lower extremities.      Assessment:    GERD controlled at this time with Omeprazole. Dilated CBD: Normal on last CT in 2013.    Plan:    US abdomen to be sure CBD is not dilated. Cmet, CBC. OV in 6 months with Dr. Karilyn Cota

## 2013-01-22 LAB — CBC WITH DIFFERENTIAL/PLATELET
Basophils Absolute: 0 10*3/uL (ref 0.0–0.1)
Basophils Relative: 1 % (ref 0–1)
Eosinophils Relative: 2 % (ref 0–5)
HCT: 35 % — ABNORMAL LOW (ref 39.0–52.0)
MCHC: 34.6 g/dL (ref 30.0–36.0)
MCV: 87.5 fL (ref 78.0–100.0)
Monocytes Absolute: 0.3 10*3/uL (ref 0.1–1.0)
Neutro Abs: 1.8 10*3/uL (ref 1.7–7.7)
RDW: 14.5 % (ref 11.5–15.5)

## 2013-01-23 ENCOUNTER — Ambulatory Visit (HOSPITAL_COMMUNITY)
Admission: RE | Admit: 2013-01-23 | Discharge: 2013-01-23 | Disposition: A | Discharge status: Home / Self Care | Payer: Medicare Other | Source: Ambulatory Visit | Attending: Internal Medicine | Admitting: Internal Medicine

## 2013-01-23 DIAGNOSIS — R932 Abnormal findings on diagnostic imaging of liver and biliary tract: Secondary | ICD-10-CM | POA: Insufficient documentation

## 2013-01-23 DIAGNOSIS — K838 Other specified diseases of biliary tract: Secondary | ICD-10-CM | POA: Insufficient documentation

## 2013-02-08 ENCOUNTER — Emergency Department (HOSPITAL_COMMUNITY): Payer: Medicare Other

## 2013-02-08 ENCOUNTER — Emergency Department (HOSPITAL_COMMUNITY)
Admission: EM | Admit: 2013-02-08 | Discharge: 2013-02-08 | Disposition: A | Discharge status: Home / Self Care | Payer: Medicare Other | Attending: Emergency Medicine | Admitting: Emergency Medicine

## 2013-02-08 ENCOUNTER — Encounter (HOSPITAL_COMMUNITY): Payer: Self-pay | Admitting: *Deleted

## 2013-02-08 DIAGNOSIS — Z79899 Other long term (current) drug therapy: Secondary | ICD-10-CM | POA: Insufficient documentation

## 2013-02-08 DIAGNOSIS — Z87448 Personal history of other diseases of urinary system: Secondary | ICD-10-CM | POA: Insufficient documentation

## 2013-02-08 DIAGNOSIS — Z8719 Personal history of other diseases of the digestive system: Secondary | ICD-10-CM | POA: Insufficient documentation

## 2013-02-08 DIAGNOSIS — I10 Essential (primary) hypertension: Secondary | ICD-10-CM

## 2013-02-08 DIAGNOSIS — E119 Type 2 diabetes mellitus without complications: Secondary | ICD-10-CM | POA: Insufficient documentation

## 2013-02-08 DIAGNOSIS — Z8619 Personal history of other infectious and parasitic diseases: Secondary | ICD-10-CM | POA: Insufficient documentation

## 2013-02-08 DIAGNOSIS — M549 Dorsalgia, unspecified: Secondary | ICD-10-CM | POA: Insufficient documentation

## 2013-02-08 DIAGNOSIS — K219 Gastro-esophageal reflux disease without esophagitis: Secondary | ICD-10-CM | POA: Insufficient documentation

## 2013-02-08 DIAGNOSIS — G8929 Other chronic pain: Secondary | ICD-10-CM | POA: Insufficient documentation

## 2013-02-08 DIAGNOSIS — Z7982 Long term (current) use of aspirin: Secondary | ICD-10-CM | POA: Insufficient documentation

## 2013-02-08 DIAGNOSIS — E785 Hyperlipidemia, unspecified: Secondary | ICD-10-CM | POA: Insufficient documentation

## 2013-02-08 DIAGNOSIS — Z8659 Personal history of other mental and behavioral disorders: Secondary | ICD-10-CM | POA: Insufficient documentation

## 2013-02-08 DIAGNOSIS — M129 Arthropathy, unspecified: Secondary | ICD-10-CM | POA: Insufficient documentation

## 2013-02-08 DIAGNOSIS — M542 Cervicalgia: Secondary | ICD-10-CM

## 2013-02-08 NOTE — ED Notes (Signed)
Pt c/o right side neck pain that is worse when he turns his head, states he first noticed it one day when he was laying propped up watching tv and then he went to get up and had a hard time getting up due to the pain, states that the pain has come and went over the past month but has become worse over the past few days. Cms intact all extremities.

## 2013-02-08 NOTE — ED Provider Notes (Signed)
CSN: [id number]     Arrival date & time 02/08/13  3086 History    This chart was scribed for Joya Gaskins, MD by Quintella Reichert, ED scribe.  This patient was seen in room APA11/APA11 and the patient's care was started at 8:28 AM.     Chief Complaint  Patient presents with  . Neck Pain    Patient is a 72 y.o. male presenting with neck pain. The history is provided by the patient. No language interpreter was used.  Neck Pain Pain location:  R side Pain radiates to:  Does not radiate Pain severity:  Moderate Onset quality:  Sudden Duration:  1 month Timing:  Intermittent Progression:  Worsening Chronicity:  New Context: not fall, not jumping from heights, not lifting a heavy object, not MCA, not MVA, not pedestrian accident and not recent injury   Context comment:  Getting up from lying down Relieved by:  None tried Worsened by:  Nothing tried Ineffective treatments:  None tried Associated symptoms: no bladder incontinence, no bowel incontinence, no chest pain, no fever, no numbness and no weakness     HPI Comments: [name] is a 72 y.o. male with h/o chronic back pain, arthritis, DM, HTN and hyperlipidemia who presents to the Emergency Department complaining of intermittent moderate right-sided neck pain that began one month ago but has become more severe over the past 2 days.   Pain is exacerbated by turning his head.  Pt states pain first began when he was getting up from lying down in a propped up position while watching TV.  He denies recent falls or injuries that may have brought on or exacerbated pain.  He denies fever, emesis, CP, new SOB, weakness or numbness in arms or legs, or urinary or bowel symptoms.  He has not taken any pain medications pta but notes that he has a prescription which he will fill tomorrow.    Past Medical History  Diagnosis Date  . Hyperlipidemia Since 1995    takes Simvastatin daily  . Herpes   . Hypertension Since 1995    takes  Metoprolol and Ramipril daily  . Sleep apnea     doesn't use CPAP  . Pneumonia     hx of;as a child  . Arthritis     fingers  . Chronic back pain   . GERD (gastroesophageal reflux disease)     takes Dexilant and Omeprazole daily  . History of gastric ulcer 25+yrs ago  . History of colon polyps   . Urinary urgency     takes Flomax daily  . Urinary frequency   . Diabetes mellitus Since 1995    Takes Glucovance and Onglyza daily    Past Surgical History  Procedure Laterality Date  . Cervical spine surgery    . Rotator cuff repair      Right  . Wrist surgery      Left  . Hand surgery      Right  . Penile prosthesis implant    . Back surgery  1962    Lumbar  . Bilateral cataract surgery    . Cardiac catheterization  02/16/10  . Colonoscopy    . Esophagogastroduodenoscopy (egd) with esophageal dilation    . Lumbar laminectomy/decompression microdiscectomy N/A 08/29/2012    Procedure: LUMBAR LAMINECTOMY/DECOMPRESSION MICRODISCECTOMY 1 LEVEL;  Surgeon: Karn Cassis, MD;  Location: [facility];  Service: Neurosurgery;  Laterality: N/A;  Lumbar four-five laminectomies    Family History  Problem Relation Age  of Onset  . Heart attack Father 61    MI  . Hypertension Father   . Heart attack Brother 50    MI  . Heart attack Paternal Uncle   . Diabetes Mother     History  Substance Use Topics  . Smoking status: Never Smoker   . Smokeless tobacco: Not on file  . Alcohol Use: Yes     Comment: occasionally beer     Review of Systems  Constitutional: Negative for fever.  HENT: Positive for neck pain.   Respiratory: Negative for shortness of breath.   Cardiovascular: Negative for chest pain.  Gastrointestinal: Negative for vomiting and bowel incontinence.  Genitourinary: Negative for bladder incontinence.  Neurological: Negative for weakness and numbness.      Allergies  Aspirin  Home Medications   Current Outpatient Rx  Name  Route  Sig  Dispense  Refill   . aspirin 81 MG EC tablet   Oral   Take 81 mg by mouth daily.           Marland Kitchen glyBURIDE-metformin (GLUCOVANCE) 5-500 MG per tablet   Oral   Take 2 tablets by mouth 2 (two) times daily with a meal.          . metoprolol (TOPROL-XL) 50 MG 24 hr tablet   Oral   Take 50 mg by mouth daily.           . Multiple Vitamin (MULTIVITAMIN) tablet   Oral   Take 1 tablet by mouth daily.         Marland Kitchen omeprazole (PRILOSEC) 40 MG capsule   Oral   Take 40 mg by mouth daily.         Marland Kitchen oxyCODONE-acetaminophen (PERCOCET/ROXICET) 5-325 MG per tablet   Oral   Take 1-2 tablets by mouth every 4 (four) hours as needed.   100 tablet   0   . ramipril (ALTACE) 10 MG tablet   Oral   Take 10 mg by mouth daily.         . simvastatin (ZOCOR) 20 MG tablet   Oral   Take 20 mg by mouth every evening.         . Tamsulosin HCl (FLOMAX) 0.4 MG CAPS   Oral   Take 0.4 mg by mouth daily.          BP 166/89  Pulse 52  Temp(Src) 98 F (36.7 C)  Resp 16  SpO2 100%   Physical Exam  Nursing note and vitals reviewed. CONSTITUTIONAL: Well developed/well nourished HEAD: Normocephalic/atraumatic EYES: EOMI/PERRL ENMT: Mucous membranes moist NECK: supple no meningeal signs. No bruits.  SPINE:entire spine nontender.  Cervical paraspinal tenderness. No bruising/crepitance/stepoffs noted to spine CV: S1/S2 noted, no murmurs/rubs/gallops noted LUNGS: Lungs are clear to auscultation bilaterally, no apparent distress ABDOMEN: soft, nontender, no rebound or guarding GU:no cva tenderness NEURO: Pt is awake/alert, moves all extremitiesx4. Face symmetric.  Equal power with hand grip, wrist flex/extension, elbow flex/extension, and equal power with shoulder abduction/adduction.  No focal sensory deficit is noted in either UE.   Equal biceps/brachioradial reflex in bilateral UE EXTREMITIES: pulses normal, full ROM SKIN: warm, color normal PSYCH: no abnormalities of mood noted    ED Course  Procedures  (  DIAGNOSTIC STUDIES: Oxygen Saturation is 100% on room air, normal by my interpretation.    COORDINATION OF CARE: 8:32 AM-Discussed treatment plan which includes imaging with pt at bedside and pt agreed to plan.  He reports he has pain meds ready for  him to pick up tomorrow.  None given here as he is driving home Stable for d/c  Labs Reviewed - No data to display   Dg Cervical Spine Complete  02/08/2013   *RADIOLOGY REPORT*  Clinical Data: Neck pain.  No injury.  CERVICAL SPINE - COMPLETE 4+ VIEW  Comparison: 10/02/2011  Findings: Diffuse degenerative changes throughout the cervical spine.  Fusion at the C7-T1 level, stable.  Normal alignment. Bilateral multilevel neural foraminal narrowing, stable. No fracture.  IMPRESSION: Diffuse degenerative changes.  No acute findings.   Original Report Authenticated By: Charlett Nose, [name]      MDM  Nursing notes including past medical history and social history reviewed and considered in documentation xrays reviewed and considered     I personally performed the services described in this documentation, which was scribed in my presence. The recorded information has been reviewed and is accurate.      Joya Gaskins, MD 02/08/13 1321

## 2013-03-02 ENCOUNTER — Encounter (INDEPENDENT_AMBULATORY_CARE_PROVIDER_SITE_OTHER): Payer: Self-pay | Admitting: *Deleted

## 2013-03-05 ENCOUNTER — Other Ambulatory Visit (INDEPENDENT_AMBULATORY_CARE_PROVIDER_SITE_OTHER): Payer: Self-pay | Admitting: *Deleted

## 2013-03-05 ENCOUNTER — Telehealth (INDEPENDENT_AMBULATORY_CARE_PROVIDER_SITE_OTHER): Payer: Self-pay | Admitting: *Deleted

## 2013-03-05 DIAGNOSIS — Z1211 Encounter for screening for malignant neoplasm of colon: Secondary | ICD-10-CM

## 2013-03-05 DIAGNOSIS — Z86010 Personal history of colonic polyps: Secondary | ICD-10-CM

## 2013-03-05 MED ORDER — PEG-KCL-NACL-NASULF-NA ASC-C 100 G PO SOLR: 1.0000 | Freq: Once | ORAL | Status: DC

## 2013-03-05 NOTE — Telephone Encounter (Signed)
Patient needs movi prep -- allergies: asa

## 2013-03-26 ENCOUNTER — Telehealth (INDEPENDENT_AMBULATORY_CARE_PROVIDER_SITE_OTHER): Payer: Self-pay | Admitting: *Deleted

## 2013-03-26 NOTE — Telephone Encounter (Signed)
  Procedure: tcs  Reason/Indication:  Hx polyps  Has patient had this procedure before?  Yes, 2009 (paper chart)  If so, when, by whom and where?    Is there a family history of colon cancer?  no  Who?  What age when diagnosed?    Is patient diabetic?   yes      Does patient have prosthetic heart valve?  no  Do you have a pacemaker?  no  Has patient ever had endocarditis? no  Has patient had joint replacement within last 12 months?  no  Is patient on Coumadin, Plavix and/or Aspirin? yes  Medications: asa 81 mg daily, glucovance 5/500 mg bid, metoprolol 50 mg daily, omeprazole 40 mg daily, multi vit daily, ramipril 10 mg daily, simvastatin 20 mg daily, tamsulosin 0.4 mg daily, percocet 5/325 mg tid  Allergies: non coated asa  Medication Adjustment: asa 2 days, 1/2 glucovance day before, hold morning of  Procedure date & time: 04/23/13 at 1030

## 2013-03-27 NOTE — Telephone Encounter (Signed)
agree

## 2013-04-09 ENCOUNTER — Encounter (HOSPITAL_COMMUNITY): Payer: Self-pay | Admitting: Pharmacy Technician

## 2013-04-23 ENCOUNTER — Encounter (HOSPITAL_COMMUNITY): Payer: Self-pay

## 2013-04-23 ENCOUNTER — Encounter (HOSPITAL_COMMUNITY)
Admission: RE | Disposition: A | Discharge status: Home / Self Care | Payer: Self-pay | Source: Ambulatory Visit | Attending: Internal Medicine

## 2013-04-23 ENCOUNTER — Ambulatory Visit: Admit: 2013-04-23 | Payer: Self-pay | Admitting: Internal Medicine

## 2013-04-23 ENCOUNTER — Ambulatory Visit (HOSPITAL_COMMUNITY)
Admission: RE | Admit: 2013-04-23 | Discharge: 2013-04-23 | Disposition: A | Discharge status: Home / Self Care | Payer: Medicare Other | Source: Ambulatory Visit | Attending: Internal Medicine | Admitting: Internal Medicine

## 2013-04-23 DIAGNOSIS — Z860100 Personal history of colon polyps, unspecified: Secondary | ICD-10-CM | POA: Insufficient documentation

## 2013-04-23 DIAGNOSIS — K648 Other hemorrhoids: Secondary | ICD-10-CM | POA: Insufficient documentation

## 2013-04-23 DIAGNOSIS — Z01812 Encounter for preprocedural laboratory examination: Secondary | ICD-10-CM | POA: Insufficient documentation

## 2013-04-23 DIAGNOSIS — E119 Type 2 diabetes mellitus without complications: Secondary | ICD-10-CM | POA: Insufficient documentation

## 2013-04-23 DIAGNOSIS — Z8661 Personal history of infections of the central nervous system: Secondary | ICD-10-CM

## 2013-04-23 DIAGNOSIS — D126 Benign neoplasm of colon, unspecified: Secondary | ICD-10-CM

## 2013-04-23 DIAGNOSIS — K644 Residual hemorrhoidal skin tags: Secondary | ICD-10-CM

## 2013-04-23 DIAGNOSIS — I10 Essential (primary) hypertension: Secondary | ICD-10-CM | POA: Insufficient documentation

## 2013-04-23 HISTORY — PX: COLONOSCOPY: SHX5424

## 2013-04-23 SURGERY — COLONOSCOPY
Anesthesia: Moderate Sedation

## 2013-04-23 MED ORDER — MIDAZOLAM HCL 5 MG/5ML IJ SOLN
INTRAMUSCULAR | Status: AC
  Filled 2013-04-23: qty 10

## 2013-04-23 MED ORDER — SODIUM CHLORIDE 0.9 % IV SOLN
INTRAVENOUS | Status: DC
  Administered 2013-04-23: 10:00:00 via INTRAVENOUS

## 2013-04-23 MED ORDER — MIDAZOLAM HCL 5 MG/5ML IJ SOLN
INTRAMUSCULAR | Status: DC | PRN
  Administered 2013-04-23: 1 mg via INTRAVENOUS
  Administered 2013-04-23: 2 mg via INTRAVENOUS

## 2013-04-23 MED ORDER — MEPERIDINE HCL 50 MG/ML IJ SOLN
INTRAMUSCULAR | Status: AC
  Filled 2013-04-23: qty 1

## 2013-04-23 MED ORDER — MEPERIDINE HCL 50 MG/ML IJ SOLN
INTRAMUSCULAR | Status: DC | PRN
  Administered 2013-04-23: 25 mg via INTRAVENOUS

## 2013-04-23 MED ORDER — STERILE WATER FOR IRRIGATION IR SOLN
Status: DC | PRN
  Administered 2013-04-23: 10:00:00

## 2013-04-23 NOTE — H&P (Signed)
[name] is an 72 y.o. male.   Chief Complaint: Patient is here for colonoscopy. HPI: Patient is 72 year old African male with history of colonic adenomas and is here for surveillance colonoscopy. He denies rectal bleeding abdominal pain or change in his bowel habits. He states his weight has been up and down. History is negative for CRC.  Past Medical History  Diagnosis Date  . Hyperlipidemia Since 1995    takes Simvastatin daily  . Herpes   . Hypertension Since 1995    takes Metoprolol and Ramipril daily  . Sleep apnea     doesn't use CPAP  . Pneumonia     hx of;as a child  . Arthritis     fingers  . Chronic back pain   . GERD (gastroesophageal reflux disease)     takes Dexilant and Omeprazole daily  . History of gastric ulcer 25+yrs ago  . History of colon polyps   . Urinary urgency     takes Flomax daily  . Urinary frequency   . Diabetes mellitus Since 1995    Takes Glucovance and Onglyza daily    Past Surgical History  Procedure Laterality Date  . Cervical spine surgery    . Rotator cuff repair      Right  . Wrist surgery      Left  . Hand surgery      Right  . Penile prosthesis implant    . Back surgery  1962    Lumbar  . Bilateral cataract surgery    . Cardiac catheterization  02/16/10  . Colonoscopy    . Esophagogastroduodenoscopy (egd) with esophageal dilation    . Lumbar laminectomy/decompression microdiscectomy N/A 08/29/2012    Procedure: LUMBAR LAMINECTOMY/DECOMPRESSION MICRODISCECTOMY 1 LEVEL;  Surgeon: Karn Cassis, MD;  Location: [facility];  Service: Neurosurgery;  Laterality: N/A;  Lumbar four-five laminectomies    Family History  Problem Relation Age of Onset  . Heart attack Father 59    MI  . Hypertension Father   . Heart attack Brother 50    MI  . Heart attack Paternal Uncle   . Diabetes Mother    Social History:  reports that he has never smoked. He does not have any smokeless tobacco history on file. He reports that he  drinks alcohol. He reports that he uses illicit drugs (Marijuana).  Allergies:  Allergies  Allergen Reactions  . Aspirin Other (See Comments)    Higher dosage upsets stomach    Medications Prior to Admission  Medication Sig Dispense Refill  . aspirin 81 MG EC tablet Take 81 mg by mouth daily.        Marland Kitchen glyBURIDE-metformin (GLUCOVANCE) 5-500 MG per tablet Take 2 tablets by mouth 2 (two) times daily with a meal.       . metoprolol (TOPROL-XL) 50 MG 24 hr tablet Take 50 mg by mouth daily.        . Multiple Vitamin (MULTIVITAMIN) tablet Take 1 tablet by mouth daily.      Marland Kitchen omeprazole (PRILOSEC) 40 MG capsule Take 40 mg by mouth daily.      Marland Kitchen oxyCODONE-acetaminophen (PERCOCET/ROXICET) 5-325 MG per tablet Take 1 tablet by mouth 3 (three) times daily as needed for pain.      . peg 3350 powder (MOVIPREP) 100 G SOLR Take 1 kit (200 g total) by mouth once.  1 kit  0  . ramipril (ALTACE) 10 MG tablet Take 10 mg by mouth daily.      Marland Kitchen  simvastatin (ZOCOR) 20 MG tablet Take 20 mg by mouth every evening.      . Tamsulosin HCl (FLOMAX) 0.4 MG CAPS Take 0.4 mg by mouth daily.        Results for orders placed during the hospital encounter of 04/23/13 (from the past 48 hour(s))  GLUCOSE, CAPILLARY     Status: Abnormal   Collection Time    04/23/13 10:07 AM      Result Value Range   Glucose-Capillary 178 (*) 70 - 99 mg/dL   No results found.  ROS  Blood pressure 150/72, pulse 52, temperature 97.6 F (36.4 C), temperature source Oral, resp. rate 14, height 5\' 10"  (1.778 m), weight 157 lb (71.215 kg), SpO2 100.00%. Physical Exam  Constitutional:  Well-developed thin African American male in NAD  HENT:  Mouth/Throat: Oropharynx is clear and moist.  Eyes: Conjunctivae are normal. No scleral icterus.  Neck: No thyromegaly present.  Cardiovascular: Normal rate, regular rhythm and normal heart sounds.   No murmur heard. Respiratory: Effort normal and breath sounds normal.  GI: Soft. He exhibits no  distension and no mass. There is no tenderness.  Lymphadenopathy:    He has no cervical adenopathy.  Neurological: He is alert.  Skin: Skin is warm and dry.     Assessment/Plan History of colonic adenomas. Surveillance colonoscopy.  Ion Gonnella U 04/23/2013, 10:16 AM

## 2013-04-23 NOTE — Op Note (Signed)
COLONOSCOPY PROCEDURE REPORT  PATIENT:  [name]  MR#:  [id number] Birthdate:  06-29-1941, 72 y.o., male Endoscopist:  Dr. Malissa Hippo, MD Referred By:  Dr. Debbe Mounts, MD Procedure Date: 04/23/2013  Procedure:   Colonoscopy  Indications:  Patient is 72 year old African male with history of colonic adenomas who is here for surveillance colonoscopy. His last exam was 5 years ago.  Informed Consent:  The procedure and risks were reviewed with the patient and informed consent was obtained.  Medications:  Demerol 25 mg IV Versed 3 mg IV  Description of procedure:  After a digital rectal exam was performed, that colonoscope was advanced from the anus through the rectum and colon to the area of the cecum, ileocecal valve and appendiceal orifice. The cecum was deeply intubated. These structures were well-seen and photographed for the record. From the level of the cecum and ileocecal valve, the scope was slowly and cautiously withdrawn. The mucosal surfaces were carefully surveyed utilizing scope tip to flexion to facilitate fold flattening as needed. The scope was pulled down into the rectum where a thorough exam including retroflexion was performed.  Findings:   Prep excellent. Three small polyps ablated via cold biopsy and submitted together. One was located at ascending colon and others 2 at hepatic flexure. Normal rectal mucosa. Small hemorrhoids above and below the dentate line.   Therapeutic/Diagnostic Maneuvers Performed:  See above  Complications:  None  Cecal Withdrawal Time:  15 minutes  Impression:  Examination performed to cecum. Three small polyps ablated via cold biopsy and submitted together(one at ascending colon and two at hepatic flexure). Internal/external hemorrhoids.  Recommendations:  Standard instructions given. I will contact patient with biopsy results and further recommendations.  REHMAN,NAJEEB U  04/23/2013 10:49 AM  CC: Dr.  Isabella Stalling, MD & Dr. Bonnetta Barry ref. provider found

## 2013-04-28 ENCOUNTER — Encounter (HOSPITAL_COMMUNITY): Payer: Self-pay | Admitting: Internal Medicine

## 2013-05-01 ENCOUNTER — Encounter (HOSPITAL_COMMUNITY): Payer: Self-pay | Admitting: Emergency Medicine

## 2013-05-01 ENCOUNTER — Emergency Department (HOSPITAL_COMMUNITY)
Admission: EM | Admit: 2013-05-01 | Discharge: 2013-05-01 | Disposition: A | Discharge status: Home / Self Care | Payer: Medicare Other | Attending: Emergency Medicine | Admitting: Emergency Medicine

## 2013-05-01 ENCOUNTER — Emergency Department (HOSPITAL_COMMUNITY): Payer: Medicare Other

## 2013-05-01 DIAGNOSIS — J40 Bronchitis, not specified as acute or chronic: Secondary | ICD-10-CM | POA: Insufficient documentation

## 2013-05-01 DIAGNOSIS — I10 Essential (primary) hypertension: Secondary | ICD-10-CM | POA: Insufficient documentation

## 2013-05-01 DIAGNOSIS — R0982 Postnasal drip: Secondary | ICD-10-CM | POA: Insufficient documentation

## 2013-05-01 DIAGNOSIS — E785 Hyperlipidemia, unspecified: Secondary | ICD-10-CM | POA: Insufficient documentation

## 2013-05-01 DIAGNOSIS — Z8619 Personal history of other infectious and parasitic diseases: Secondary | ICD-10-CM | POA: Insufficient documentation

## 2013-05-01 DIAGNOSIS — R1013 Epigastric pain: Secondary | ICD-10-CM | POA: Insufficient documentation

## 2013-05-01 DIAGNOSIS — G8929 Other chronic pain: Secondary | ICD-10-CM | POA: Insufficient documentation

## 2013-05-01 DIAGNOSIS — Z8711 Personal history of peptic ulcer disease: Secondary | ICD-10-CM | POA: Insufficient documentation

## 2013-05-01 DIAGNOSIS — Z8701 Personal history of pneumonia (recurrent): Secondary | ICD-10-CM | POA: Insufficient documentation

## 2013-05-01 DIAGNOSIS — R51 Headache: Secondary | ICD-10-CM | POA: Insufficient documentation

## 2013-05-01 DIAGNOSIS — M19049 Primary osteoarthritis, unspecified hand: Secondary | ICD-10-CM | POA: Insufficient documentation

## 2013-05-01 DIAGNOSIS — IMO0002 Reserved for concepts with insufficient information to code with codable children: Secondary | ICD-10-CM | POA: Insufficient documentation

## 2013-05-01 DIAGNOSIS — Z860100 Personal history of colon polyps, unspecified: Secondary | ICD-10-CM | POA: Insufficient documentation

## 2013-05-01 DIAGNOSIS — Z7982 Long term (current) use of aspirin: Secondary | ICD-10-CM | POA: Insufficient documentation

## 2013-05-01 DIAGNOSIS — Z79899 Other long term (current) drug therapy: Secondary | ICD-10-CM | POA: Insufficient documentation

## 2013-05-01 DIAGNOSIS — K219 Gastro-esophageal reflux disease without esophagitis: Secondary | ICD-10-CM | POA: Insufficient documentation

## 2013-05-01 DIAGNOSIS — E119 Type 2 diabetes mellitus without complications: Secondary | ICD-10-CM | POA: Insufficient documentation

## 2013-05-01 LAB — CBC WITH DIFFERENTIAL/PLATELET
Basophils Absolute: 0 10*3/uL (ref 0.0–0.1)
Eosinophils Relative: 3 % (ref 0–5)
HCT: 33.9 % — ABNORMAL LOW (ref 39.0–52.0)
Lymphocytes Relative: 40 % (ref 12–46)
Lymphs Abs: 1.4 10*3/uL (ref 0.7–4.0)
MCHC: 34.8 g/dL (ref 30.0–36.0)
MCV: 91.1 fL (ref 78.0–100.0)
Neutro Abs: 1.7 10*3/uL (ref 1.7–7.7)
Platelets: 197 10*3/uL (ref 150–400)
RBC: 3.72 MIL/uL — ABNORMAL LOW (ref 4.22–5.81)
RDW: 12.6 % (ref 11.5–15.5)
WBC: 3.5 10*3/uL — ABNORMAL LOW (ref 4.0–10.5)

## 2013-05-01 LAB — BASIC METABOLIC PANEL
BUN: 10 mg/dL (ref 6–23)
CO2: 30 mEq/L (ref 19–32)
Calcium: 9.8 mg/dL (ref 8.4–10.5)
Chloride: 102 mEq/L (ref 96–112)
Glucose, Bld: 73 mg/dL (ref 70–99)
Potassium: 4.2 mEq/L (ref 3.5–5.1)
Sodium: 140 mEq/L (ref 135–145)

## 2013-05-01 LAB — TROPONIN I: Troponin I: <0.3 ng/mL (ref <0.30)

## 2013-05-01 MED ORDER — CEPHALEXIN 500 MG PO CAPS: 500.0000 mg | ORAL_CAPSULE | Freq: Four times a day (QID) | ORAL | Status: DC

## 2013-05-01 MED ORDER — AEROCHAMBER Z-STAT PLUS/MEDIUM MISC: 1.0000 | Freq: Once | Status: DC

## 2013-05-01 MED ORDER — PREDNISONE 50 MG PO TABS
60.0000 mg | ORAL_TABLET | Freq: Once | ORAL | Status: AC
  Administered 2013-05-01: 60 mg via ORAL
  Filled 2013-05-01 (×2): qty 1

## 2013-05-01 MED ORDER — ALBUTEROL SULFATE (5 MG/ML) 0.5% IN NEBU
10.0000 mg | INHALATION_SOLUTION | Freq: Once | RESPIRATORY_TRACT | Status: AC
  Administered 2013-05-01: 10 mg via RESPIRATORY_TRACT
  Filled 2013-05-01: qty 2

## 2013-05-01 MED ORDER — PREDNISONE 20 MG PO TABS: 20.0000 mg | ORAL_TABLET | Freq: Two times a day (BID) | ORAL | Status: DC

## 2013-05-01 MED ORDER — IPRATROPIUM BROMIDE 0.02 % IN SOLN
0.5000 mg | Freq: Once | RESPIRATORY_TRACT | Status: AC
  Administered 2013-05-01: 0.5 mg via RESPIRATORY_TRACT
  Filled 2013-05-01: qty 2.5

## 2013-05-01 MED ORDER — ALBUTEROL SULFATE HFA 108 (90 BASE) MCG/ACT IN AERS
2.0000 | INHALATION_SPRAY | RESPIRATORY_TRACT | Status: DC | PRN
  Administered 2013-05-01: 2 via RESPIRATORY_TRACT
  Filled 2013-05-01: qty 6.7

## 2013-05-01 NOTE — ED Notes (Signed)
Pt c/o productive cough with green sputum and headache x 3 days.

## 2013-05-01 NOTE — ED Provider Notes (Signed)
CSN: [id number]     Arrival date & time 05/01/13  1009 History  This chart was scribed for Flint Melter, MD by Ardelia Mems, ED Scribe. This patient was seen in room APA19/[facility]19 and the patient's care was started at 11:06 AM.  Chief Complaint  Patient presents with  . Cough    The history is provided by the patient. No language interpreter was used.   HPI Comments: [name] is a 72 y.o. male with a history of pneumonia who presents to the Emergency Department complaining of a cough productive of green sputum over the past 3-4 days. He also states that he has a frontal headache and postnasal drip. He states that he has chest pain with forceful coughing. He states that he has had no medications PTA. He states that he saw his PCP on 04/10/13 for back and DM. He denies fever or any other symptoms. He denies smoking and is an occasional alcohol user. He lives with a family member.  PCP- Dr. Oval Linsey   Past Medical History  Diagnosis Date  . Hyperlipidemia Since 1995    takes Simvastatin daily  . Herpes   . Hypertension Since 1995    takes Metoprolol and Ramipril daily  . Sleep apnea     doesn't use CPAP  . Pneumonia     hx of;as a child  . Arthritis     fingers  . Chronic back pain   . GERD (gastroesophageal reflux disease)     takes Dexilant and Omeprazole daily  . History of gastric ulcer 25+yrs ago  . History of colon polyps   . Urinary urgency     takes Flomax daily  . Urinary frequency   . Diabetes mellitus Since 1995    Takes Glucovance and Onglyza daily   Past Surgical History  Procedure Laterality Date  . Cervical spine surgery    . Rotator cuff repair      Right  . Wrist surgery      Left  . Hand surgery      Right  . Penile prosthesis implant    . Back surgery  1962    Lumbar  . Bilateral cataract surgery    . Cardiac catheterization  02/16/10  . Colonoscopy    . Esophagogastroduodenoscopy (egd) with esophageal dilation    . Lumbar  laminectomy/decompression microdiscectomy N/A 08/29/2012    Procedure: LUMBAR LAMINECTOMY/DECOMPRESSION MICRODISCECTOMY 1 LEVEL;  Surgeon: Karn Cassis, MD;  Location: [facility];  Service: Neurosurgery;  Laterality: N/A;  Lumbar four-five laminectomies  . Colonoscopy N/A 04/23/2013    Procedure: COLONOSCOPY;  Surgeon: Malissa Hippo, MD;  Location: [facility];  Service: Endoscopy;  Laterality: N/A;  1030   Family History  Problem Relation Age of Onset  . Heart attack Father 74    MI  . Hypertension Father   . Heart attack Brother 50    MI  . Heart attack Paternal Uncle   . Diabetes Mother    History  Substance Use Topics  . Smoking status: Never Smoker   . Smokeless tobacco: Not on file  . Alcohol Use: Yes     Comment: occasionally beer    Review of Systems  Constitutional: Negative for fever.  HENT: Positive for postnasal drip.   Respiratory: Positive for cough.   Cardiovascular: Positive for chest pain (with coughing).  Neurological: Positive for headaches.  All other systems reviewed and are negative.    Allergies  Aspirin  Home  Medications   Current Outpatient Rx  Name  Route  Sig  Dispense  Refill  . aspirin 81 MG EC tablet   Oral   Take 81 mg by mouth daily.           Marland Kitchen glyBURIDE-metformin (GLUCOVANCE) 5-500 MG per tablet   Oral   Take 2 tablets by mouth 2 (two) times daily with a meal.          . LANTUS SOLOSTAR 100 UNIT/ML SOPN   Subcutaneous   Inject 12 Units into the skin at bedtime.         . metoprolol (TOPROL-XL) 50 MG 24 hr tablet   Oral   Take 50 mg by mouth daily.           . Multiple Vitamin (MULTIVITAMIN) tablet   Oral   Take 1 tablet by mouth daily.         Marland Kitchen omeprazole (PRILOSEC) 40 MG capsule   Oral   Take 40 mg by mouth daily.         Marland Kitchen oxyCODONE-acetaminophen (PERCOCET/ROXICET) 5-325 MG per tablet   Oral   Take 1 tablet by mouth 3 (three) times daily as needed for pain.         . ramipril (ALTACE) 10 MG  tablet   Oral   Take 10 mg by mouth daily.         . simvastatin (ZOCOR) 20 MG tablet   Oral   Take 20 mg by mouth every evening.         . Tamsulosin HCl (FLOMAX) 0.4 MG CAPS   Oral   Take 0.4 mg by mouth daily.         . cephALEXin (KEFLEX) 500 MG capsule   Oral   Take 1 capsule (500 mg total) by mouth 4 (four) times daily.   28 capsule   0   . predniSONE (DELTASONE) 20 MG tablet   Oral   Take 1 tablet (20 mg total) by mouth 2 (two) times daily.   10 tablet   0    Triage Vitals: BP 146/73  Pulse 54  Temp(Src) 97.5 F (36.4 C) (Oral)  Resp 20  Ht 5\' 10"  (1.778 m)  Wt 154 lb (69.854 kg)  BMI 22.1 kg/m2  SpO2 100%  Physical Exam  Nursing note and vitals reviewed. Constitutional: He is oriented to person, place, and time. He appears well-developed and well-nourished.  HENT:  Head: Normocephalic and atraumatic.  Right Ear: External ear normal.  Left Ear: External ear normal.  Eyes: Conjunctivae and EOM are normal. Pupils are equal, round, and reactive to light.  Neck: Normal range of motion and phonation normal. Neck supple.  Cardiovascular: Normal rate, regular rhythm, normal heart sounds and intact distal pulses.   Pulmonary/Chest: Effort normal and breath sounds normal. He exhibits no bony tenderness.  Decreased air movement with scattered rhonchi.   Abdominal: Soft. Normal appearance. There is tenderness (mild epigastric).  Musculoskeletal: Normal range of motion.  Neurological: He is alert and oriented to person, place, and time. No cranial nerve deficit or sensory deficit. He exhibits normal muscle tone. Coordination normal.  Skin: Skin is warm, dry and intact.  Psychiatric: He has a normal mood and affect. His behavior is normal. Judgment and thought content normal.    ED Course  Procedures (including critical care time)  DIAGNOSTIC STUDIES: Oxygen Saturation is 100% on RA, normal by my interpretation.    COORDINATION OF CARE: Medications   albuterol (PROVENTIL  HFA;VENTOLIN HFA) 108 (90 BASE) MCG/ACT inhaler 2 puff (2 puffs Inhalation Given 05/01/13 1246)  aerochamber Z-Stat Plus/medium 1 each (not administered)  albuterol (PROVENTIL) (5 MG/ML) 0.5% nebulizer solution 10 mg (10 mg Nebulization Given 05/01/13 1148)  ipratropium (ATROVENT) nebulizer solution 0.5 mg (0.5 mg Nebulization Given 05/01/13 1148)  predniSONE (DELTASONE) tablet 60 mg (60 mg Oral Given 05/01/13 1145)    Patient Vitals for the past 24 hrs:  BP Temp Temp src Pulse Resp SpO2 Height Weight  05/01/13 1412 165/67 mmHg - - 71 21 100 % - -  05/01/13 1337 158/80 mmHg 98.3 F (36.8 C) Oral 76 18 100 % - -  05/01/13 1247 - - - - - 100 % - -  05/01/13 1148 - - - - - 100 % - -  05/01/13 1145 160/73 mmHg - - 56 18 100 % - -  05/01/13 1023 146/73 mmHg 97.5 F (36.4 C) Oral 54 20 100 % 5\' 10"  (1.778 m) 154 lb (69.854 kg)   11:13 AM- Pt advised of plan for treatment and pt agrees.  Labs Review Labs Reviewed  CBC WITH DIFFERENTIAL - Abnormal; Notable for the following:    WBC 3.5 (*)    RBC 3.72 (*)    Hemoglobin 11.8 (*)    HCT 33.9 (*)    All other components within normal limits  BASIC METABOLIC PANEL - Abnormal; Notable for the following:    GFR calc non Af Amer 81 (*)    All other components within normal limits  TROPONIN I   Imaging Review Dg Chest 2 View  05/01/2013   CLINICAL DATA:  productive cough and chest pain  EXAM: CHEST  2 VIEW  COMPARISON:  August 26, 2012  FINDINGS: The lungs are clear. The heart size and pulmonary vascularity are normal. No adenopathy. No pneumothorax. No bone lesions.  IMPRESSION: No abnormality noted.   Electronically Signed   By: Bretta Bang [name]   On: 05/01/2013 10:55    EKG Interpretation     Ventricular Rate:  65 PR Interval:  160 QRS Duration: 80 QT Interval:  370 QTC Calculation: 384 R Axis:   28 Text Interpretation:  Sinus rhythm with Premature atrial complexes Septal infarct , age undetermined  Abnormal ECG When compared with ECG of 03-Sep-2011 06:18, Vent. rate has decreased BY  40 BPM Nonspecific T wave abnormality, improved in Inferior leads            MDM   1. Bronchitis    Evaluation is consistent with bronchitis. No evidence for pneumonia. ACS, metabolic instability, or suspected impending vascular collapse. He is stable for discharge.  Nursing Notes Reviewed/ Care Coordinated, and agree without changes. Applicable Imaging Reviewed.  Interpretation of Laboratory Data incorporated into ED treatment    Plan: Home Medications-  Keflex, prednisone ; Home Treatments and Observation- rest; return here if the recommended treatment, does not improve the symptoms; Recommended follow up- PCP. Followup 1 week     I personally performed the services described in this documentation, which was scribed in my presence. The recorded information has been reviewed and is accurate.     Flint Melter, MD 05/01/13 [phone]

## 2013-05-05 ENCOUNTER — Encounter (INDEPENDENT_AMBULATORY_CARE_PROVIDER_SITE_OTHER): Payer: Self-pay | Admitting: *Deleted

## 2013-06-03 ENCOUNTER — Encounter (INDEPENDENT_AMBULATORY_CARE_PROVIDER_SITE_OTHER): Payer: Self-pay | Admitting: *Deleted

## 2013-06-08 ENCOUNTER — Emergency Department (HOSPITAL_COMMUNITY)
Admission: EM | Admit: 2013-06-08 | Discharge: 2013-06-08 | Disposition: A | Discharge status: Home / Self Care | Payer: Medicare Other | Attending: Emergency Medicine | Admitting: Emergency Medicine

## 2013-06-08 ENCOUNTER — Encounter (HOSPITAL_COMMUNITY): Payer: Self-pay | Admitting: Emergency Medicine

## 2013-06-08 DIAGNOSIS — Z8711 Personal history of peptic ulcer disease: Secondary | ICD-10-CM | POA: Insufficient documentation

## 2013-06-08 DIAGNOSIS — Z860100 Personal history of colon polyps, unspecified: Secondary | ICD-10-CM | POA: Insufficient documentation

## 2013-06-08 DIAGNOSIS — Z7982 Long term (current) use of aspirin: Secondary | ICD-10-CM | POA: Insufficient documentation

## 2013-06-08 DIAGNOSIS — Z792 Long term (current) use of antibiotics: Secondary | ICD-10-CM | POA: Insufficient documentation

## 2013-06-08 DIAGNOSIS — R350 Frequency of micturition: Secondary | ICD-10-CM | POA: Insufficient documentation

## 2013-06-08 DIAGNOSIS — E785 Hyperlipidemia, unspecified: Secondary | ICD-10-CM | POA: Insufficient documentation

## 2013-06-08 DIAGNOSIS — Z79899 Other long term (current) drug therapy: Secondary | ICD-10-CM | POA: Insufficient documentation

## 2013-06-08 DIAGNOSIS — K219 Gastro-esophageal reflux disease without esophagitis: Secondary | ICD-10-CM | POA: Insufficient documentation

## 2013-06-08 DIAGNOSIS — R109 Unspecified abdominal pain: Secondary | ICD-10-CM | POA: Insufficient documentation

## 2013-06-08 DIAGNOSIS — Z794 Long term (current) use of insulin: Secondary | ICD-10-CM | POA: Insufficient documentation

## 2013-06-08 DIAGNOSIS — J329 Chronic sinusitis, unspecified: Secondary | ICD-10-CM

## 2013-06-08 DIAGNOSIS — Z8619 Personal history of other infectious and parasitic diseases: Secondary | ICD-10-CM | POA: Insufficient documentation

## 2013-06-08 DIAGNOSIS — Z8701 Personal history of pneumonia (recurrent): Secondary | ICD-10-CM | POA: Insufficient documentation

## 2013-06-08 DIAGNOSIS — E119 Type 2 diabetes mellitus without complications: Secondary | ICD-10-CM | POA: Insufficient documentation

## 2013-06-08 DIAGNOSIS — I10 Essential (primary) hypertension: Secondary | ICD-10-CM | POA: Insufficient documentation

## 2013-06-08 DIAGNOSIS — R7989 Other specified abnormal findings of blood chemistry: Secondary | ICD-10-CM

## 2013-06-08 DIAGNOSIS — M19049 Primary osteoarthritis, unspecified hand: Secondary | ICD-10-CM | POA: Insufficient documentation

## 2013-06-08 DIAGNOSIS — R300 Dysuria: Secondary | ICD-10-CM | POA: Insufficient documentation

## 2013-06-08 DIAGNOSIS — R748 Abnormal levels of other serum enzymes: Secondary | ICD-10-CM | POA: Insufficient documentation

## 2013-06-08 DIAGNOSIS — IMO0002 Reserved for concepts with insufficient information to code with codable children: Secondary | ICD-10-CM | POA: Insufficient documentation

## 2013-06-08 DIAGNOSIS — G8929 Other chronic pain: Secondary | ICD-10-CM | POA: Insufficient documentation

## 2013-06-08 LAB — CBC
HCT: 37 % — ABNORMAL LOW (ref 39.0–52.0)
Hemoglobin: 13 g/dL (ref 13.0–17.0)
MCH: 31.9 pg (ref 26.0–34.0)
MCV: 90.7 fL (ref 78.0–100.0)
Platelets: 178 10*3/uL (ref 150–400)
RBC: 4.08 MIL/uL — ABNORMAL LOW (ref 4.22–5.81)
RDW: 12.7 % (ref 11.5–15.5)
WBC: 4 10*3/uL (ref 4.0–10.5)

## 2013-06-08 LAB — BASIC METABOLIC PANEL
BUN: 20 mg/dL (ref 6–23)
CO2: 25 mEq/L (ref 19–32)
Chloride: 99 mEq/L (ref 96–112)
Creatinine, Ser: 1.5 mg/dL — ABNORMAL HIGH (ref 0.50–1.35)
GFR calc Af Amer: 52 mL/min — ABNORMAL LOW (ref >90)
Glucose, Bld: 134 mg/dL — ABNORMAL HIGH (ref 70–99)
Potassium: 4.1 mEq/L (ref 3.5–5.1)
Sodium: 134 mEq/L — ABNORMAL LOW (ref 135–145)

## 2013-06-08 LAB — URINALYSIS, ROUTINE W REFLEX MICROSCOPIC
Bilirubin Urine: NEGATIVE
Glucose, UA: NEGATIVE mg/dL
Nitrite: NEGATIVE
Specific Gravity, Urine: >1.03 — ABNORMAL HIGH (ref 1.005–1.030)
pH: 6 (ref 5.0–8.0)

## 2013-06-08 MED ORDER — DEXTROSE 5 % IV SOLN: 1.0000 g | Freq: Once | INTRAVENOUS | Status: DC

## 2013-06-08 MED ORDER — OXYMETAZOLINE HCL 0.05 % NA SOLN
1.0000 | Freq: Once | NASAL | Status: AC
  Administered 2013-06-08: 1 via NASAL
  Filled 2013-06-08: qty 15

## 2013-06-08 MED ORDER — LIDOCAINE HCL 2 % EX GEL
Freq: Once | CUTANEOUS | Status: AC
  Administered 2013-06-08: 1 via TOPICAL
  Filled 2013-06-08: qty 10

## 2013-06-08 MED ORDER — AMOXICILLIN-POT CLAVULANATE 875-125 MG PO TABS: 1.0000 | ORAL_TABLET | Freq: Two times a day (BID) | ORAL | Status: DC

## 2013-06-08 NOTE — ED Notes (Signed)
Patient complaining of pain stomach with painful urination. Patient also complaining of swelling in right eye.

## 2013-06-08 NOTE — ED Notes (Signed)
MD at bedside. 

## 2013-06-08 NOTE — ED Provider Notes (Signed)
CSN: [id number]     Arrival date & time 06/08/13  0227 History   First MD Initiated Contact with Patient 06/08/13 0241     Chief Complaint  Patient presents with  . Urinary Frequency  . Facial Swelling   (Consider location/radiation/quality/duration/timing/severity/associated sxs/prior Treatment) HPI History provided by patient. Ongoing sinus pressure and congestion and not feeling well for the last 4 days. Saw his primary care physician at that time as prescribed mucinex and Bactrim. He is taking medications without relief. He is now developed some facial swelling below his right eye. No difficulty with vision. He has not noticed any fevers but does complain of body aches all over. Tonight he also has some lower abdominal pressure and some pain with urination. No hematuria. No urinary retention. Symptoms are moderate in severity. No chest pain or shortness of breath. No cough. No rash. Symptoms moderate in severity.  Patient also complaining of a small sore that has developed just inside his right nare. No drainage. It is painful to touch.  Past Medical History  Diagnosis Date  . Hyperlipidemia Since 1995    takes Simvastatin daily  . Herpes   . Hypertension Since 1995    takes Metoprolol and Ramipril daily  . Sleep apnea     doesn't use CPAP  . Pneumonia     hx of;as a child  . Arthritis     fingers  . Chronic back pain   . GERD (gastroesophageal reflux disease)     takes Dexilant and Omeprazole daily  . History of gastric ulcer 25+yrs ago  . History of colon polyps   . Urinary urgency     takes Flomax daily  . Urinary frequency   . Diabetes mellitus Since 1995    Takes Glucovance and Onglyza daily   Past Surgical History  Procedure Laterality Date  . Cervical spine surgery    . Rotator cuff repair      Right  . Wrist surgery      Left  . Hand surgery      Right  . Penile prosthesis implant    . Back surgery  1962    Lumbar  . Bilateral cataract surgery    .  Cardiac catheterization  02/16/10  . Colonoscopy    . Esophagogastroduodenoscopy (egd) with esophageal dilation    . Lumbar laminectomy/decompression microdiscectomy N/A 08/29/2012    Procedure: LUMBAR LAMINECTOMY/DECOMPRESSION MICRODISCECTOMY 1 LEVEL;  Surgeon: Karn Cassis, MD;  Location: [facility];  Service: Neurosurgery;  Laterality: N/A;  Lumbar four-five laminectomies  . Colonoscopy N/A 04/23/2013    Procedure: COLONOSCOPY;  Surgeon: Malissa Hippo, MD;  Location: [facility];  Service: Endoscopy;  Laterality: N/A;  1030   Family History  Problem Relation Age of Onset  . Heart attack Father 35    MI  . Hypertension Father   . Heart attack Brother 50    MI  . Heart attack Paternal Uncle   . Diabetes Mother    History  Substance Use Topics  . Smoking status: Never Smoker   . Smokeless tobacco: Not on file  . Alcohol Use: Yes     Comment: occasionally beer    Review of Systems  Constitutional: Negative for diaphoresis.  HENT: Negative for trouble swallowing and voice change.   Eyes: Negative for photophobia, redness and visual disturbance.  Respiratory: Negative for chest tightness and shortness of breath.   Cardiovascular: Negative for chest pain.  Gastrointestinal: Negative for vomiting and abdominal pain.  Genitourinary: Positive for dysuria. Negative for hematuria.  Musculoskeletal: Negative for back pain, neck pain and neck stiffness.  Skin: Negative for rash.  Neurological: Negative for weakness and numbness.  All other systems reviewed and are negative.    Allergies  Aspirin  Home Medications   Current Outpatient Rx  Name  Route  Sig  Dispense  Refill  . aspirin 81 MG EC tablet   Oral   Take 81 mg by mouth daily.           Marland Kitchen glyBURIDE-metformin (GLUCOVANCE) 5-500 MG per tablet   Oral   Take 2 tablets by mouth 2 (two) times daily with a meal.          . LANTUS SOLOSTAR 100 UNIT/ML SOPN   Subcutaneous   Inject 12 Units into the skin at  bedtime.         . metoprolol (TOPROL-XL) 50 MG 24 hr tablet   Oral   Take 50 mg by mouth daily.           . Multiple Vitamin (MULTIVITAMIN) tablet   Oral   Take 1 tablet by mouth daily.         Marland Kitchen omeprazole (PRILOSEC) 40 MG capsule   Oral   Take 40 mg by mouth daily.         Marland Kitchen oxyCODONE-acetaminophen (PERCOCET/ROXICET) 5-325 MG per tablet   Oral   Take 1 tablet by mouth 3 (three) times daily as needed for pain.         . pseudoephedrine-guaifenesin (MUCINEX D) 60-600 MG per tablet   Oral   Take 1 tablet by mouth every 12 (twelve) hours.         . ramipril (ALTACE) 10 MG tablet   Oral   Take 10 mg by mouth daily.         . simvastatin (ZOCOR) 20 MG tablet   Oral   Take 20 mg by mouth every evening.         . sulfamethoxazole-trimethoprim (BACTRIM DS) 800-160 MG per tablet   Oral   Take 1 tablet by mouth 2 (two) times daily.         . Tamsulosin HCl (FLOMAX) 0.4 MG CAPS   Oral   Take 0.4 mg by mouth daily.         . cephALEXin (KEFLEX) 500 MG capsule   Oral   Take 1 capsule (500 mg total) by mouth 4 (four) times daily.   28 capsule   0   . predniSONE (DELTASONE) 20 MG tablet   Oral   Take 1 tablet (20 mg total) by mouth 2 (two) times daily.   10 tablet   0    BP 135/73  Pulse 64  Resp 20  Ht 5\' 10"  (1.778 m)  Wt 157 lb (71.215 kg)  BMI 22.53 kg/m2  SpO2 100% Physical Exam  Constitutional: He is oriented to person, place, and time. He appears well-developed and well-nourished.  HENT:  Head: Normocephalic.  Tenderness over maxillary sinuses with nasal congestion and associated right maxillary swelling. No erythema or increased warmth to touch. No conjunctival injection or periorbital cellulitis. No pain with extraocular movements intact.  Eyes: EOM are normal. Pupils are equal, round, and reactive to light. Right eye exhibits no discharge. Left eye exhibits no discharge. No scleral icterus.  Neck: Neck supple. No tracheal deviation  present.  Cardiovascular: Normal rate, regular rhythm and intact distal pulses.   Pulmonary/Chest: Effort normal and breath sounds normal. No  stridor. No respiratory distress. He exhibits no tenderness.  Abdominal: Soft. Bowel sounds are normal. He exhibits no distension. There is no rebound and no guarding.  Some mild midline suprapubic tenderness without abdominal tenderness otherwise. No CVA tenderness.  Musculoskeletal: Normal range of motion. He exhibits no edema.  Lymphadenopathy:    He has no cervical adenopathy.  Neurological: He is alert and oriented to person, place, and time.  Skin: Skin is warm and dry.    ED Course  Procedures (including critical care time) Labs Review Labs Reviewed  CBC - Abnormal; Notable for the following:    RBC 4.08 (*)    HCT 37.0 (*)    All other components within normal limits  BASIC METABOLIC PANEL - Abnormal; Notable for the following:    Sodium 134 (*)    Glucose, Bld 134 (*)    Creatinine, Ser 1.50 (*)    GFR calc non Af Amer 45 (*)    GFR calc Af Amer 52 (*)    All other components within normal limits  URINALYSIS, ROUTINE W REFLEX MICROSCOPIC - Abnormal; Notable for the following:    APPearance HAZY (*)    Specific Gravity, Urine >1.030 (*)    Ketones, ur TRACE (*)    All other components within normal limits   Medications provided - afrin, lidocaine to small sore/ early abscess R nare   On recheck, feeling better and requesting to be discharged home. He has mildly elevated creatinine. No obvious UTI. No leukocytosis.  Plan discharge home with outpatient followup to recheck creatinine. Patient agrees to drink plenty of fluids at home and stop taking ramipril until further directed by his doctor. He will continue Afrin twice daily for 3 days and then discontinue this medication. Will prescribe Augmentin for worsening symptoms of sinusitis despite Bactrim. Patient agrees to strict return precautions and is a reliable historian.  MDM   Diagnoses: Sinusitis, elevated creatinine  Urinalysis and labs reviewed as above. Medications provided. Condition improving. Vital signs and nurses notes reviewed and considered    Sunnie Nielsen, MD 06/08/13 [phone]

## 2013-06-09 ENCOUNTER — Encounter (HOSPITAL_COMMUNITY): Payer: Self-pay | Admitting: Emergency Medicine

## 2013-06-09 ENCOUNTER — Emergency Department (HOSPITAL_COMMUNITY)
Admission: EM | Admit: 2013-06-09 | Discharge: 2013-06-09 | Disposition: A | Discharge status: Home / Self Care | Payer: Medicare Other | Attending: Emergency Medicine | Admitting: Emergency Medicine

## 2013-06-09 DIAGNOSIS — Z8701 Personal history of pneumonia (recurrent): Secondary | ICD-10-CM | POA: Insufficient documentation

## 2013-06-09 DIAGNOSIS — E119 Type 2 diabetes mellitus without complications: Secondary | ICD-10-CM | POA: Insufficient documentation

## 2013-06-09 DIAGNOSIS — M549 Dorsalgia, unspecified: Secondary | ICD-10-CM | POA: Insufficient documentation

## 2013-06-09 DIAGNOSIS — Z79899 Other long term (current) drug therapy: Secondary | ICD-10-CM | POA: Insufficient documentation

## 2013-06-09 DIAGNOSIS — Z860100 Personal history of colon polyps, unspecified: Secondary | ICD-10-CM | POA: Insufficient documentation

## 2013-06-09 DIAGNOSIS — R112 Nausea with vomiting, unspecified: Secondary | ICD-10-CM | POA: Insufficient documentation

## 2013-06-09 DIAGNOSIS — R7989 Other specified abnormal findings of blood chemistry: Secondary | ICD-10-CM

## 2013-06-09 DIAGNOSIS — Z792 Long term (current) use of antibiotics: Secondary | ICD-10-CM | POA: Insufficient documentation

## 2013-06-09 DIAGNOSIS — Z8669 Personal history of other diseases of the nervous system and sense organs: Secondary | ICD-10-CM | POA: Insufficient documentation

## 2013-06-09 DIAGNOSIS — K219 Gastro-esophageal reflux disease without esophagitis: Secondary | ICD-10-CM | POA: Insufficient documentation

## 2013-06-09 DIAGNOSIS — R944 Abnormal results of kidney function studies: Secondary | ICD-10-CM | POA: Insufficient documentation

## 2013-06-09 DIAGNOSIS — I10 Essential (primary) hypertension: Secondary | ICD-10-CM | POA: Insufficient documentation

## 2013-06-09 DIAGNOSIS — Z8711 Personal history of peptic ulcer disease: Secondary | ICD-10-CM | POA: Insufficient documentation

## 2013-06-09 DIAGNOSIS — Z8619 Personal history of other infectious and parasitic diseases: Secondary | ICD-10-CM | POA: Insufficient documentation

## 2013-06-09 DIAGNOSIS — Z95818 Presence of other cardiac implants and grafts: Secondary | ICD-10-CM | POA: Insufficient documentation

## 2013-06-09 DIAGNOSIS — R3915 Urgency of urination: Secondary | ICD-10-CM | POA: Insufficient documentation

## 2013-06-09 DIAGNOSIS — Z7982 Long term (current) use of aspirin: Secondary | ICD-10-CM | POA: Insufficient documentation

## 2013-06-09 DIAGNOSIS — IMO0002 Reserved for concepts with insufficient information to code with codable children: Secondary | ICD-10-CM | POA: Insufficient documentation

## 2013-06-09 DIAGNOSIS — E785 Hyperlipidemia, unspecified: Secondary | ICD-10-CM | POA: Insufficient documentation

## 2013-06-09 DIAGNOSIS — G8929 Other chronic pain: Secondary | ICD-10-CM | POA: Insufficient documentation

## 2013-06-09 DIAGNOSIS — Z794 Long term (current) use of insulin: Secondary | ICD-10-CM | POA: Insufficient documentation

## 2013-06-09 DIAGNOSIS — M19049 Primary osteoarthritis, unspecified hand: Secondary | ICD-10-CM | POA: Insufficient documentation

## 2013-06-09 DIAGNOSIS — R0789 Other chest pain: Secondary | ICD-10-CM | POA: Insufficient documentation

## 2013-06-09 LAB — CBC WITH DIFFERENTIAL/PLATELET
Eosinophils Absolute: 0 10*3/uL (ref 0.0–0.7)
HCT: 38.4 % — ABNORMAL LOW (ref 39.0–52.0)
Hemoglobin: 13.7 g/dL (ref 13.0–17.0)
Lymphocytes Relative: 36 % (ref 12–46)
Lymphs Abs: 1 10*3/uL (ref 0.7–4.0)
MCH: 32.1 pg (ref 26.0–34.0)
MCHC: 35.7 g/dL (ref 30.0–36.0)
Monocytes Absolute: 0.3 10*3/uL (ref 0.1–1.0)
Monocytes Relative: 11 % (ref 3–12)
Neutro Abs: 1.4 10*3/uL — ABNORMAL LOW (ref 1.7–7.7)
Neutrophils Relative %: 52 % (ref 43–77)
RBC: 4.27 MIL/uL (ref 4.22–5.81)
WBC: 2.8 10*3/uL — ABNORMAL LOW (ref 4.0–10.5)

## 2013-06-09 LAB — BASIC METABOLIC PANEL
BUN: 21 mg/dL (ref 6–23)
Chloride: 96 mEq/L (ref 96–112)
Creatinine, Ser: 1.42 mg/dL — ABNORMAL HIGH (ref 0.50–1.35)
GFR calc non Af Amer: 48 mL/min — ABNORMAL LOW (ref >90)
Glucose, Bld: 297 mg/dL — ABNORMAL HIGH (ref 70–99)
Potassium: 4.8 mEq/L (ref 3.5–5.1)
Sodium: 134 mEq/L — ABNORMAL LOW (ref 135–145)

## 2013-06-09 MED ORDER — SODIUM CHLORIDE 0.9 % IV SOLN
Freq: Once | INTRAVENOUS | Status: AC
  Administered 2013-06-09: 03:00:00 via INTRAVENOUS

## 2013-06-09 MED ORDER — ONDANSETRON HCL 4 MG/2ML IJ SOLN
4.0000 mg | Freq: Once | INTRAMUSCULAR | Status: AC
  Administered 2013-06-09: 4 mg via INTRAVENOUS
  Filled 2013-06-09: qty 2

## 2013-06-09 MED ORDER — ONDANSETRON HCL 4 MG PO TABS: 4.0000 mg | ORAL_TABLET | Freq: Four times a day (QID) | ORAL | Status: DC | PRN

## 2013-06-09 NOTE — ED Provider Notes (Signed)
CSN: [id number]     Arrival date & time 06/09/13  0137 History   First MD Initiated Contact with Patient 06/09/13 0328     Chief Complaint  Patient presents with  . Emesis   (Consider location/radiation/quality/duration/timing/severity/associated sxs/prior Treatment) Patient is a 72 y.o. male presenting with vomiting. The history is provided by the patient.  Emesis He has had onset this evening of nausea and vomiting. Following this, he did have a very mild pressure feeling in his chest. He rated at a pressure feeling at 3/10. It has resolved. Nausea was postcoital. He drank a soda which he says may have just a little funny. He denies fever, chills, sweats denies abdominal pain, cramping, diarrhea. He denies any sick contacts. He has been given a dose of ondansetron in the ED and nausea is now resolved.   Past Medical History  Diagnosis Date  . Hyperlipidemia Since 1995    takes Simvastatin daily  . Herpes   . Hypertension Since 1995    takes Metoprolol and Ramipril daily  . Sleep apnea     doesn't use CPAP  . Pneumonia     hx of;as a child  . Arthritis     fingers  . Chronic back pain   . GERD (gastroesophageal reflux disease)     takes Dexilant and Omeprazole daily  . History of gastric ulcer 25+yrs ago  . History of colon polyps   . Urinary urgency     takes Flomax daily  . Urinary frequency   . Diabetes mellitus Since 1995    Takes Glucovance and Onglyza daily   Past Surgical History  Procedure Laterality Date  . Cervical spine surgery    . Rotator cuff repair      Right  . Wrist surgery      Left  . Hand surgery      Right  . Penile prosthesis implant    . Back surgery  1962    Lumbar  . Bilateral cataract surgery    . Cardiac catheterization  02/16/10  . Colonoscopy    . Esophagogastroduodenoscopy (egd) with esophageal dilation    . Lumbar laminectomy/decompression microdiscectomy N/A 08/29/2012    Procedure: LUMBAR LAMINECTOMY/DECOMPRESSION MICRODISCECTOMY 1  LEVEL;  Surgeon: Karn Cassis, MD;  Location: [facility];  Service: Neurosurgery;  Laterality: N/A;  Lumbar four-five laminectomies  . Colonoscopy N/A 04/23/2013    Procedure: COLONOSCOPY;  Surgeon: Malissa Hippo, MD;  Location: [facility];  Service: Endoscopy;  Laterality: N/A;  1030   Family History  Problem Relation Age of Onset  . Heart attack Father 43    MI  . Hypertension Father   . Heart attack Brother 50    MI  . Heart attack Paternal Uncle   . Diabetes Mother    History  Substance Use Topics  . Smoking status: Never Smoker   . Smokeless tobacco: Not on file  . Alcohol Use: Yes     Comment: occasionally beer    Review of Systems  Gastrointestinal: Positive for vomiting.  All other systems reviewed and are negative.    Allergies  Aspirin  Home Medications   Current Outpatient Rx  Name  Route  Sig  Dispense  Refill  . amoxicillin-clavulanate (AUGMENTIN) 875-125 MG per tablet   Oral   Take 1 tablet by mouth 2 (two) times daily.   14 tablet   0   . aspirin 81 MG EC tablet   Oral   Take 81 mg  by mouth daily.           . cephALEXin (KEFLEX) 500 MG capsule   Oral   Take 1 capsule (500 mg total) by mouth 4 (four) times daily.   28 capsule   0   . glyBURIDE-metformin (GLUCOVANCE) 5-500 MG per tablet   Oral   Take 2 tablets by mouth 2 (two) times daily with a meal.          . LANTUS SOLOSTAR 100 UNIT/ML SOPN   Subcutaneous   Inject 12 Units into the skin at bedtime.         . metoprolol (TOPROL-XL) 50 MG 24 hr tablet   Oral   Take 50 mg by mouth daily.           . Multiple Vitamin (MULTIVITAMIN) tablet   Oral   Take 1 tablet by mouth daily.         Marland Kitchen omeprazole (PRILOSEC) 40 MG capsule   Oral   Take 40 mg by mouth daily.         Marland Kitchen oxyCODONE-acetaminophen (PERCOCET/ROXICET) 5-325 MG per tablet   Oral   Take 1 tablet by mouth 3 (three) times daily as needed for pain.         . predniSONE (DELTASONE) 20 MG tablet    Oral   Take 1 tablet (20 mg total) by mouth 2 (two) times daily.   10 tablet   0   . pseudoephedrine-guaifenesin (MUCINEX D) 60-600 MG per tablet   Oral   Take 1 tablet by mouth every 12 (twelve) hours.         . ramipril (ALTACE) 10 MG tablet   Oral   Take 10 mg by mouth daily.         . simvastatin (ZOCOR) 20 MG tablet   Oral   Take 20 mg by mouth every evening.         . sulfamethoxazole-trimethoprim (BACTRIM DS) 800-160 MG per tablet   Oral   Take 1 tablet by mouth 2 (two) times daily.         . Tamsulosin HCl (FLOMAX) 0.4 MG CAPS   Oral   Take 0.4 mg by mouth daily.          BP 137/85  Pulse 78  Temp(Src) 98.4 F (36.9 C) (Oral)  Resp 18  Ht 5\' 10"  (1.778 m)  Wt 159 lb (72.122 kg)  BMI 22.81 kg/m2  SpO2 99% Physical Exam  Nursing note and vitals reviewed.  72 year old male, resting comfortably and in no acute distress. Vital signs are normal. Oxygen saturation is 99%, which is normal. Head is normocephalic and atraumatic. PERRLA, EOMI. Oropharynx is clear. Neck is nontender and supple without adenopathy or JVD. Back is nontender and there is no CVA tenderness. Lungs are clear without rales, wheezes, or rhonchi. Chest is nontender. Heart has regular rate and rhythm without murmur. Abdomen is soft, flat, nontender without masses or hepatosplenomegaly and peristalsis is normoactive. Extremities have no cyanosis or edema, full range of motion is present. Skin is warm and dry without rash. Neurologic: Mental status is normal, cranial nerves are intact, there are no motor or sensory deficits.  ED Course  Procedures (including critical care time) Labs Review Results for orders placed during the hospital encounter of 06/09/13  CBC WITH DIFFERENTIAL      Result Value Range   WBC 2.8 (*) 4.0 - 10.5 K/uL   RBC 4.27  4.22 - 5.81 MIL/uL  Hemoglobin 13.7  13.0 - 17.0 g/dL   HCT 40.9 (*) 81.1 - 91.4 %   MCV 89.9  78.0 - 100.0 fL   MCH 32.1  26.0 - 34.0 pg    MCHC 35.7  30.0 - 36.0 g/dL   RDW 78.2  95.6 - 21.3 %   Platelets 188  150 - 400 K/uL   Neutrophils Relative % 52  43 - 77 %   Neutro Abs 1.4 (*) 1.7 - 7.7 K/uL   Lymphocytes Relative 36  12 - 46 %   Lymphs Abs 1.0  0.7 - 4.0 K/uL   Monocytes Relative 11  3 - 12 %   Monocytes Absolute 0.3  0.1 - 1.0 K/uL   Eosinophils Relative 1  0 - 5 %   Eosinophils Absolute 0.0  0.0 - 0.7 K/uL   Basophils Relative 0  0 - 1 %   Basophils Absolute 0.0  0.0 - 0.1 K/uL  BASIC METABOLIC PANEL      Result Value Range   Sodium 134 (*) 135 - 145 mEq/L   Potassium 4.8  3.5 - 5.1 mEq/L   Chloride 96  96 - 112 mEq/L   CO2 27  19 - 32 mEq/L   Glucose, Bld 297 (*) 70 - 99 mg/dL   BUN 21  6 - 23 mg/dL   Creatinine, Ser 0.86 (*) 0.50 - 1.35 mg/dL   Calcium 57.8  8.4 - 46.9 mg/dL   GFR calc non Af Amer 48 (*) >90 mL/min   GFR calc Af Amer 55 (*) >90 mL/min  TROPONIN I      Result Value Range   Troponin I <0.30  <0.30 ng/mL    EKG Interpretation    Date/Time:  Tuesday June 09 2013 03:43:10 EST Ventricular Rate:  68 PR Interval:  166 QRS Duration: 80 QT Interval:  376 QTC Calculation: 399 R Axis:   -13 Text Interpretation:  Sinus rhythm with Premature atrial complexes ST elevation, consider early repolarization Borderline ECG When compared with ECG of 01-May-2013 11:01, No significant change was found Confirmed by [facility]  MD, Victoria Euceda ([id number]) on 06/09/2013 3:57:24 AM            MDM   1. Nausea & vomiting   2. Elevated serum creatinine     Nausea and vomiting which may be viral gastritis, might be mild food poisoning. Because of associated chest pressure, ECG and troponin will be checked. However, since chest pressure followed vomiting, I think it is more likely related to reflux related to the vomiting. Screening labs are obtained. Old records are reviewed and it did not seem he record of coronary disease although he does have risk factors of diabetes and hypertension.   ECG is unchanged.  Creatinine is somewhat elevated over her baseline. He feels better after IV fluids and IV ondansetron. He is discharged with a prescription for ondansetron and is advised to follow up with his PCP in one week to recheck his serum creatinine.  Dione Booze, MD 06/09/13 [phone]

## 2013-06-09 NOTE — ED Notes (Signed)
Patient states he had dinner and then had sex and began vomiting when he was taking the individual home.

## 2013-06-20 ENCOUNTER — Encounter (HOSPITAL_COMMUNITY): Payer: Self-pay | Admitting: Emergency Medicine

## 2013-06-20 ENCOUNTER — Other Ambulatory Visit: Payer: Self-pay

## 2013-06-20 ENCOUNTER — Emergency Department (HOSPITAL_COMMUNITY): Payer: Medicare Other

## 2013-06-20 ENCOUNTER — Emergency Department (HOSPITAL_COMMUNITY)
Admission: EM | Admit: 2013-06-20 | Discharge: 2013-06-20 | Disposition: A | Discharge status: Home / Self Care | Payer: Medicare Other | Attending: Emergency Medicine | Admitting: Emergency Medicine

## 2013-06-20 DIAGNOSIS — IMO0001 Reserved for inherently not codable concepts without codable children: Secondary | ICD-10-CM | POA: Insufficient documentation

## 2013-06-20 DIAGNOSIS — Z792 Long term (current) use of antibiotics: Secondary | ICD-10-CM | POA: Insufficient documentation

## 2013-06-20 DIAGNOSIS — M2550 Pain in unspecified joint: Secondary | ICD-10-CM | POA: Insufficient documentation

## 2013-06-20 DIAGNOSIS — K5900 Constipation, unspecified: Secondary | ICD-10-CM | POA: Insufficient documentation

## 2013-06-20 DIAGNOSIS — R5381 Other malaise: Secondary | ICD-10-CM | POA: Insufficient documentation

## 2013-06-20 DIAGNOSIS — R0789 Other chest pain: Secondary | ICD-10-CM | POA: Insufficient documentation

## 2013-06-20 DIAGNOSIS — Z79899 Other long term (current) drug therapy: Secondary | ICD-10-CM | POA: Insufficient documentation

## 2013-06-20 DIAGNOSIS — Z87891 Personal history of nicotine dependence: Secondary | ICD-10-CM | POA: Insufficient documentation

## 2013-06-20 DIAGNOSIS — E119 Type 2 diabetes mellitus without complications: Secondary | ICD-10-CM | POA: Insufficient documentation

## 2013-06-20 DIAGNOSIS — Z791 Long term (current) use of non-steroidal anti-inflammatories (NSAID): Secondary | ICD-10-CM | POA: Insufficient documentation

## 2013-06-20 DIAGNOSIS — I10 Essential (primary) hypertension: Secondary | ICD-10-CM | POA: Insufficient documentation

## 2013-06-20 DIAGNOSIS — J329 Chronic sinusitis, unspecified: Secondary | ICD-10-CM | POA: Insufficient documentation

## 2013-06-20 HISTORY — DX: Type 2 diabetes mellitus without complications: E11.9

## 2013-06-20 LAB — HEPATIC FUNCTION PANEL
Alkaline Phosphatase: 49 U/L (ref 39–117)
Bilirubin, Direct: 0.1 mg/dL (ref 0.0–0.3)
Indirect Bilirubin: 0.2 mg/dL — ABNORMAL LOW (ref 0.3–0.9)
Total Bilirubin: 0.3 mg/dL (ref 0.3–1.2)

## 2013-06-20 LAB — CBC WITH DIFFERENTIAL/PLATELET
Basophils Absolute: 0 10*3/uL (ref 0.0–0.1)
Basophils Relative: 0 % (ref 0–1)
Eosinophils Absolute: 0 10*3/uL (ref 0.0–0.7)
Eosinophils Relative: 1 % (ref 0–5)
HCT: 34.2 % — ABNORMAL LOW (ref 39.0–52.0)
Hemoglobin: 12 g/dL — ABNORMAL LOW (ref 13.0–17.0)
Lymphocytes Relative: 12 % (ref 12–46)
Lymphs Abs: 0.7 10*3/uL (ref 0.7–4.0)
MCH: 31.5 pg (ref 26.0–34.0)
MCHC: 35.1 g/dL (ref 30.0–36.0)
MCV: 89.8 fL (ref 78.0–100.0)
Monocytes Absolute: 0.5 10*3/uL (ref 0.1–1.0)
Monocytes Relative: 8 % (ref 3–12)
Neutro Abs: 4.6 10*3/uL (ref 1.7–7.7)
Neutrophils Relative %: 80 % — ABNORMAL HIGH (ref 43–77)
Platelets: 160 10*3/uL (ref 150–400)
RBC: 3.81 MIL/uL — ABNORMAL LOW (ref 4.22–5.81)
RDW: 12.4 % (ref 11.5–15.5)
WBC: 5.7 10*3/uL (ref 4.0–10.5)

## 2013-06-20 LAB — BASIC METABOLIC PANEL
Calcium: 9.5 mg/dL (ref 8.4–10.5)
GFR calc Af Amer: >90 mL/min (ref >90)
GFR calc non Af Amer: 81 mL/min — ABNORMAL LOW (ref >90)
Sodium: 132 mEq/L — ABNORMAL LOW (ref 135–145)

## 2013-06-20 LAB — TROPONIN I: Troponin I: <0.3 ng/mL (ref <0.30)

## 2013-06-20 LAB — LIPASE, BLOOD: Lipase: 31 U/L (ref 11–59)

## 2013-06-20 LAB — RAPID STREP SCREEN (MED CTR MEBANE ONLY): Streptococcus, Group A Screen (Direct): NEGATIVE

## 2013-06-20 MED ORDER — ONDANSETRON 8 MG PO TBDP
8.0000 mg | ORAL_TABLET | Freq: Once | ORAL | Status: AC
  Administered 2013-06-20: 8 mg via ORAL

## 2013-06-20 MED ORDER — POLYETHYLENE GLYCOL 3350 17 G PO PACK: 17.0000 g | PACK | Freq: Every day | ORAL | Status: DC

## 2013-06-20 MED ORDER — AMOXICILLIN 500 MG PO CAPS: 500.0000 mg | ORAL_CAPSULE | Freq: Three times a day (TID) | ORAL | Status: DC

## 2013-06-20 MED ORDER — ONDANSETRON 8 MG PO TBDP
ORAL_TABLET | ORAL | Status: AC
  Administered 2013-06-20: 8 mg via ORAL
  Filled 2013-06-20: qty 1

## 2013-06-20 NOTE — ED Notes (Signed)
Pt c/o runny nose, cough, congestion, and chest pain when he coughs.

## 2013-06-20 NOTE — ED Provider Notes (Signed)
CSN: [id number]     Arrival date & time 06/20/13  2008 History   First MD Initiated Contact with Patient 06/20/13 2019     Chief Complaint  Patient presents with  . Nasal Congestion  . Cough  . Chest Pain   (Consider location/radiation/quality/duration/timing/severity/associated sxs/prior Treatment) HPI Comments: Patient presents with 2 day history of runny nose, dry cough, congestion, sinus pain and chest pain with coughing. So she was body aches and chills. No documented fever. No sick contacts. Denies chest pain other than with coughing. No exertional chest pain. No back pain or abdominal pain. Complains of body aches and gradual onset headache associated with sinus tenderness, congestion runny nose. No sore throat. No pain with swallowing. Did not receive flu shot. No recent travel.  The history is provided by the patient.    Past Medical History  Diagnosis Date  . Diabetes mellitus without complication   . Hypertension    Past Surgical History  Procedure Laterality Date  . Back surgery    . Neck surgery    . Shoulder surgery    . Wrist surgery     History reviewed. No pertinent family history. History  Substance Use Topics  . Smoking status: Former Games developer  . Smokeless tobacco: Not on file  . Alcohol Use: No    Review of Systems  Constitutional: Positive for chills and fatigue. Negative for fever.  HENT: Positive for congestion and rhinorrhea. Negative for sore throat and trouble swallowing.   Eyes: Negative for visual disturbance.  Respiratory: Positive for cough and chest tightness. Negative for shortness of breath.   Cardiovascular: Positive for chest pain.  Gastrointestinal: Negative for nausea, vomiting and abdominal pain.  Genitourinary: Negative for dysuria and hematuria.  Musculoskeletal: Positive for arthralgias and myalgias.  Skin: Negative for rash.  Neurological: Positive for weakness. Negative for dizziness and light-headedness.  A complete 10 system  review of systems was obtained and all systems are negative except as noted in the HPI and PMH.    Allergies  Bayer aspirin  Home Medications   Current Outpatient Rx  Name  Route  Sig  Dispense  Refill  . glyBURIDE-metformin (GLUCOVANCE) 5-500 MG per tablet   Oral   Take 2 tablets by mouth 2 (two) times daily.         Marland Kitchen LANTUS SOLOSTAR 100 UNIT/ML SOPN   Subcutaneous   Inject 12 Units into the skin at bedtime.          . metoprolol succinate (TOPROL-XL) 50 MG 24 hr tablet   Oral   Take 50 mg by mouth daily.         . naproxen (NAPROSYN) 500 MG tablet   Oral   Take 500 mg by mouth 2 (two) times daily.         . simvastatin (ZOCOR) 20 MG tablet   Oral   Take 20 mg by mouth daily.         Marland Kitchen amoxicillin (AMOXIL) 500 MG capsule   Oral   Take 1 capsule (500 mg total) by mouth 3 (three) times daily.   21 capsule   0   . amoxicillin-clavulanate (AUGMENTIN) 875-125 MG per tablet   Oral   Take 1 tablet by mouth 2 (two) times daily. 7 day course starting on 06/08/2013         . omeprazole (PRILOSEC) 40 MG capsule   Oral   Take 40 mg by mouth daily.         Marland Kitchen  oxyCODONE (OXY IR/ROXICODONE) 5 MG immediate release tablet   Oral   Take 5 mg by mouth 3 (three) times daily as needed. For pain         . oxyCODONE-acetaminophen (PERCOCET/ROXICET) 5-325 MG per tablet   Oral   Take 1 tablet by mouth 3 (three) times daily as needed. For pain         . polyethylene glycol (MIRALAX) packet   Oral   Take 17 g by mouth daily.   14 each   0   . sulfamethoxazole-trimethoprim (BACTRIM DS) 800-160 MG per tablet   Oral   Take 1 tablet by mouth 2 (two) times daily. 10 day course starting on 06/01/2013          BP 137/70  Pulse 79  Temp(Src) 98.6 F (37 C) (Oral)  Resp 22  Ht 5\' 10"  (1.778 m)  Wt 155 lb (70.308 kg)  BMI 22.24 kg/m2  SpO2 97% Physical Exam  Constitutional: He is oriented to person, place, and time. He appears well-developed and well-nourished.  No distress.  HENT:  Head: Normocephalic.  Mouth/Throat: Oropharynx is clear and moist. No oropharyngeal exudate.  Nasal congestion Frontal sinus tenderness. Oropharynx clear No lesions on soft palate  Eyes: Conjunctivae and EOM are normal. Pupils are equal, round, and reactive to light.  Neck: Normal range of motion. Neck supple.  No meningismus  Cardiovascular: Normal rate, regular rhythm, normal heart sounds and intact distal pulses.   No murmur heard. Pulmonary/Chest: Effort normal and breath sounds normal. No respiratory distress. He exhibits tenderness.  Abdominal: Soft. There is no tenderness. There is no rebound and no guarding.  Musculoskeletal: Normal range of motion. He exhibits no edema and no tenderness.  Neurological: He is alert and oriented to person, place, and time. No cranial nerve deficit. He exhibits normal muscle tone. Coordination normal.  Skin: Skin is warm.    ED Course  Procedures (including critical care time) Labs Review Labs Reviewed  CBC WITH DIFFERENTIAL - Abnormal; Notable for the following:    RBC 3.81 (*)    Hemoglobin 12.0 (*)    HCT 34.2 (*)    Neutrophils Relative % 80 (*)    All other components within normal limits  BASIC METABOLIC PANEL - Abnormal; Notable for the following:    Sodium 132 (*)    Chloride 95 (*)    Glucose, Bld 175 (*)    GFR calc non Af Amer 81 (*)    All other components within normal limits  INFLUENZA PANEL BY PCR - Abnormal; Notable for the following:    Influenza A By PCR POSITIVE (*)    All other components within normal limits  HEPATIC FUNCTION PANEL - Abnormal; Notable for the following:    Indirect Bilirubin 0.2 (*)    All other components within normal limits  GLUCOSE, CAPILLARY - Abnormal; Notable for the following:    Glucose-Capillary 159 (*)    All other components within normal limits  RAPID STREP SCREEN  CULTURE, GROUP A STREP  TROPONIN I  LIPASE, BLOOD  LACTIC ACID, PLASMA   Imaging Review Dg  Chest 2 View  06/20/2013   CLINICAL DATA:  Cough.  Abdominal pain.  nausea and vomiting.  EXAM: CHEST  2 VIEW  COMPARISON:  None.  FINDINGS: The heart size and mediastinal contours are within normal limits. Both lungs are clear. Mild hyperinflation is suspicious for COPD. The visualized skeletal structures are unremarkable.  IMPRESSION: Mild hyperinflation suspicious for COPD. No active  cardiopulmonary disease.   Electronically Signed   By: Myles Rosenthal [name]   On: 06/20/2013 21:46   Dg Abd 2 Views  06/20/2013   CLINICAL DATA:  Abdominal pain.  nausea and vomiting.  EXAM: ABDOMEN - 2 VIEW  COMPARISON:  None.  FINDINGS: No evidence of dilated bowel loops. Large colonic stool burden noted. No evidence of free air. No air-fluid levels noted. Penile prosthesis noted.  IMPRESSION: No acute findings.  Large stool burden noted; suggest clinical correlation for possible constipation.   Electronically Signed   By: Myles Rosenthal [name]   On: 06/20/2013 21:47    EKG Interpretation   None       MDM   1. Sinusitis   2. Constipation    Upper respiratory symptoms with cough, congestion runny nose and chest pain with coughing. Nontoxic appearing vital stable.  On arrival to the ED, patient had one episode of vomiting which looked to be clear. He denies any abdominal pain. Because of this, labs studies were sent.  EKG is normal sinus rhythm with no evidence of ischemia. Blood sugar is 159. Equal upper extremity blood pressures. Doubt aortic dissection or PE.  Labs unremarkable. CXR negative. Patient tolerating PO in the ED, abdomen soft and nontender. Influenza swab pending. Will treat for sinusitis and constipation.   Date: 06/20/2013  Rate: 94  Rhythm: normal sinus rhythm  QRS Axis: normal  Intervals: normal  ST/T Wave abnormalities: nonspecific ST/T changes  Conduction Disutrbances:none  Narrative Interpretation:   Old EKG Reviewed: none available    Glynn Octave, MD 06/21/13 1135

## 2013-06-20 NOTE — ED Notes (Signed)
Patient vomited once. MD aware.

## 2013-06-21 LAB — INFLUENZA PANEL BY PCR (TYPE A & B): Influenza A By PCR: POSITIVE — AB

## 2013-06-21 NOTE — ED Provider Notes (Signed)
Flu swab positive.  Illness began AM of 12/12. At 48 hours of illness now. Tamiflu prescription called to [facility]. Patient aware.  Glynn Octave, MD 06/21/13 [phone]

## 2013-06-22 ENCOUNTER — Encounter (HOSPITAL_COMMUNITY): Payer: Self-pay | Admitting: Emergency Medicine

## 2013-06-23 LAB — CULTURE, GROUP A STREP

## 2013-08-10 ENCOUNTER — Ambulatory Visit (INDEPENDENT_AMBULATORY_CARE_PROVIDER_SITE_OTHER): Payer: Self-pay | Admitting: Internal Medicine

## 2013-08-10 ENCOUNTER — Encounter (INDEPENDENT_AMBULATORY_CARE_PROVIDER_SITE_OTHER): Payer: Self-pay | Admitting: Internal Medicine

## 2013-08-10 VITALS — BP 140/80 | HR 80 | Temp 98.4°F | Resp 18 | Ht 70.0 in | Wt 150.4 lb

## 2013-08-10 DIAGNOSIS — K838 Other specified diseases of biliary tract: Secondary | ICD-10-CM

## 2013-08-10 DIAGNOSIS — K219 Gastro-esophageal reflux disease without esophagitis: Secondary | ICD-10-CM

## 2013-08-10 DIAGNOSIS — Z86010 Personal history of colonic polyps: Secondary | ICD-10-CM

## 2013-08-10 NOTE — Patient Instructions (Signed)
Call if you have upper abdominal pain.

## 2013-08-10 NOTE — Progress Notes (Signed)
Presenting complaint;  Followup for dilated bile duct. Patient also has chronic GERD.  Subjective:  Patient is 73 year old [place] American male who presents for scheduled visit. He has history of mildly dilated bile duct and was last seen in July 2014. He denies right upper quadrant abdominal pain. He has good appetite but complains of early satiety. He has not experienced nausea or vomiting. He states heartburn is well controlled with therapy. He has dysphagia and even he does not chew his food thoroughly and is rushing. He has not had any episodes of regurgitation his weight is down by 3 pounds. His bowels move daily. He denies melena or rectal bleeding. He states he had blood work when he was seen in emergency room in December and diagnosed with flu. Last EGD/Ed was in January 2013. Last colonoscopy was in October 2014 with removal of 3 small polyps which were tubular adenomas.   Current Medications: Current Outpatient Prescriptions  Medication Sig Dispense Refill  . aspirin 81 MG EC tablet Take 81 mg by mouth daily.        Marland Kitchen glyBURIDE-metformin (GLUCOVANCE) 5-500 MG per tablet Take 2 tablets by mouth 2 (two) times daily with a meal.       . LANTUS SOLOSTAR 100 UNIT/ML SOPN Inject 12 Units into the skin at bedtime.      . metoprolol (TOPROL-XL) 50 MG 24 hr tablet Take 50 mg by mouth daily.        . Multiple Vitamin (MULTIVITAMIN) tablet Take 1 tablet by mouth daily.      Marland Kitchen omeprazole (PRILOSEC) 40 MG capsule Take 40 mg by mouth daily.      Marland Kitchen oxyCODONE (OXY IR/ROXICODONE) 5 MG immediate release tablet Take 5 mg by mouth 3 (three) times daily as needed. For pain      . polyethylene glycol (MIRALAX) packet Take 17 g by mouth daily.  14 each  0  . ramipril (ALTACE) 10 MG tablet Take 10 mg by mouth daily.      . simvastatin (ZOCOR) 20 MG tablet Take 20 mg by mouth daily.      . Tamsulosin HCl (FLOMAX) 0.4 MG CAPS Take 0.4 mg by mouth daily.      Marland Kitchen amoxicillin (AMOXIL) 500 MG capsule Take  1 capsule (500 mg total) by mouth 3 (three) times daily.  21 capsule  0  . [DISCONTINUED] bismuth-metronidazole-tetracycline (PLYERA) 140-125-125 MG per capsule Take 3 capsules by mouth 4 (four) times daily -  before meals and at bedtime.       No current facility-administered medications for this visit.     Objective: Blood pressure 140/80, pulse 80, temperature 98.4 F (36.9 C), temperature source Oral, resp. rate 18, height 5\' 10"  (1.778 m), weight 150 lb 6.4 oz (68.221 kg). Patient is alert and in no acute distress. Conjunctiva is pink. Sclera is nonicteric Oropharyngeal mucosa is normal. No neck masses or thyromegaly noted. Cardiac exam with regular rhythm normal S1 and S2. No murmur or gallop noted. Lungs are clear to auscultation. Abdomen is flat soft and nontender without organomegaly or masses. No LE edema or clubbing noted.  Labs/studies Results: LFTs from 06/20/2013. Bilirubin 0.3, AP 49, AST 18, ALT 21 and albumin 4.1.  Assessment:  #1. History of mildly dilated bile duct. On July 2014 study it was only 7-8 mm and previously was 9 mm. Prior imaging studies have been negative for cholelithiasis or choledocholithiasis. Transaminases are normal. No further workup needed other than repeat LFTs in 6 months. #2.  GERD. Symptoms well controlled with therapy. #3. 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 Last colonoscopy was in October 2014.    Plan:  Call if dysphagia progresses. Office visit in 6 months at which time he will have LFTs. Next colonoscopy in October 2019.

## 2013-12-27 ENCOUNTER — Other Ambulatory Visit: Payer: Self-pay

## 2013-12-27 ENCOUNTER — Emergency Department (HOSPITAL_COMMUNITY)
Admission: EM | Admit: 2013-12-27 | Discharge: 2013-12-27 | Disposition: A | Discharge status: Home / Self Care | Payer: Medicare HMO | Attending: Emergency Medicine | Admitting: Emergency Medicine

## 2013-12-27 ENCOUNTER — Emergency Department (HOSPITAL_COMMUNITY): Payer: Medicare HMO

## 2013-12-27 ENCOUNTER — Encounter (HOSPITAL_COMMUNITY): Payer: Self-pay | Admitting: Emergency Medicine

## 2013-12-27 DIAGNOSIS — Z860100 Personal history of colon polyps, unspecified: Secondary | ICD-10-CM | POA: Insufficient documentation

## 2013-12-27 DIAGNOSIS — R5383 Other fatigue: Secondary | ICD-10-CM

## 2013-12-27 DIAGNOSIS — Z8701 Personal history of pneumonia (recurrent): Secondary | ICD-10-CM | POA: Insufficient documentation

## 2013-12-27 DIAGNOSIS — J209 Acute bronchitis, unspecified: Secondary | ICD-10-CM | POA: Insufficient documentation

## 2013-12-27 DIAGNOSIS — Z792 Long term (current) use of antibiotics: Secondary | ICD-10-CM | POA: Insufficient documentation

## 2013-12-27 DIAGNOSIS — K219 Gastro-esophageal reflux disease without esophagitis: Secondary | ICD-10-CM | POA: Insufficient documentation

## 2013-12-27 DIAGNOSIS — G8929 Other chronic pain: Secondary | ICD-10-CM | POA: Insufficient documentation

## 2013-12-27 DIAGNOSIS — I10 Essential (primary) hypertension: Secondary | ICD-10-CM | POA: Insufficient documentation

## 2013-12-27 DIAGNOSIS — R5381 Other malaise: Secondary | ICD-10-CM | POA: Insufficient documentation

## 2013-12-27 DIAGNOSIS — E785 Hyperlipidemia, unspecified: Secondary | ICD-10-CM | POA: Insufficient documentation

## 2013-12-27 DIAGNOSIS — J40 Bronchitis, not specified as acute or chronic: Secondary | ICD-10-CM

## 2013-12-27 DIAGNOSIS — R42 Dizziness and giddiness: Secondary | ICD-10-CM | POA: Insufficient documentation

## 2013-12-27 DIAGNOSIS — M129 Arthropathy, unspecified: Secondary | ICD-10-CM | POA: Insufficient documentation

## 2013-12-27 DIAGNOSIS — Z7982 Long term (current) use of aspirin: Secondary | ICD-10-CM | POA: Insufficient documentation

## 2013-12-27 DIAGNOSIS — R51 Headache: Secondary | ICD-10-CM | POA: Insufficient documentation

## 2013-12-27 DIAGNOSIS — M542 Cervicalgia: Secondary | ICD-10-CM | POA: Insufficient documentation

## 2013-12-27 DIAGNOSIS — Z8619 Personal history of other infectious and parasitic diseases: Secondary | ICD-10-CM | POA: Insufficient documentation

## 2013-12-27 DIAGNOSIS — E119 Type 2 diabetes mellitus without complications: Secondary | ICD-10-CM | POA: Insufficient documentation

## 2013-12-27 LAB — CBC WITH DIFFERENTIAL/PLATELET
BASOS ABS: 0 10*3/uL (ref 0.0–0.1)
BASOS PCT: 1 % (ref 0–1)
Eosinophils Absolute: 0.1 10*3/uL (ref 0.0–0.7)
Eosinophils Relative: 3 % (ref 0–5)
HCT: 35.4 % — ABNORMAL LOW (ref 39.0–52.0)
Hemoglobin: 12.7 g/dL — ABNORMAL LOW (ref 13.0–17.0)
Lymphocytes Relative: 50 % — ABNORMAL HIGH (ref 12–46)
Lymphs Abs: 1.6 10*3/uL (ref 0.7–4.0)
MCH: 32.2 pg (ref 26.0–34.0)
MCHC: 35.9 g/dL (ref 30.0–36.0)
MCV: 89.8 fL (ref 78.0–100.0)
MONO ABS: 0.2 10*3/uL (ref 0.1–1.0)
Monocytes Relative: 6 % (ref 3–12)
NEUTROS ABS: 1.3 10*3/uL — AB (ref 1.7–7.7)
NEUTROS PCT: 40 % — AB (ref 43–77)
Platelets: 165 10*3/uL (ref 150–400)
RBC: 3.94 MIL/uL — ABNORMAL LOW (ref 4.22–5.81)
RDW: 12.2 % (ref 11.5–15.5)
WBC: 3.2 10*3/uL — ABNORMAL LOW (ref 4.0–10.5)

## 2013-12-27 LAB — COMPREHENSIVE METABOLIC PANEL
ALBUMIN: 3.8 g/dL (ref 3.5–5.2)
ALT: 12 U/L (ref 0–53)
AST: 17 U/L (ref 0–37)
Alkaline Phosphatase: 41 U/L (ref 39–117)
BILIRUBIN TOTAL: 0.5 mg/dL (ref 0.3–1.2)
BUN: 16 mg/dL (ref 6–23)
CO2: 28 mEq/L (ref 19–32)
CREATININE: 0.95 mg/dL (ref 0.50–1.35)
Calcium: 9.8 mg/dL (ref 8.4–10.5)
Chloride: 100 mEq/L (ref 96–112)
GFR calc Af Amer: >90 mL/min (ref >90)
GFR calc non Af Amer: 81 mL/min — ABNORMAL LOW (ref >90)
Glucose, Bld: 88 mg/dL (ref 70–99)
POTASSIUM: 4.7 meq/L (ref 3.7–5.3)
Sodium: 139 mEq/L (ref 137–147)
Total Protein: 7.2 g/dL (ref 6.0–8.3)

## 2013-12-27 LAB — URINE MICROSCOPIC-ADD ON

## 2013-12-27 LAB — URINALYSIS, ROUTINE W REFLEX MICROSCOPIC
Bilirubin Urine: NEGATIVE
GLUCOSE, UA: NEGATIVE mg/dL
Ketones, ur: NEGATIVE mg/dL
Leukocytes, UA: NEGATIVE
Nitrite: NEGATIVE
PH: 5 (ref 5.0–8.0)
PROTEIN: NEGATIVE mg/dL
Specific Gravity, Urine: >1.03 — ABNORMAL HIGH (ref 1.005–1.030)
Urobilinogen, UA: 0.2 mg/dL (ref 0.0–1.0)

## 2013-12-27 LAB — I-STAT TROPONIN, ED: TROPONIN I, POC: 0 ng/mL (ref 0.00–0.08)

## 2013-12-27 MED ORDER — SODIUM CHLORIDE 0.9 % IV SOLN
INTRAVENOUS | Status: DC
  Administered 2013-12-27: 09:00:00 via INTRAVENOUS

## 2013-12-27 MED ORDER — GUAIFENESIN 100 MG/5ML PO SYRP: 100.0000 mg | ORAL_SOLUTION | ORAL | Status: DC | PRN

## 2013-12-27 MED ORDER — AZITHROMYCIN 250 MG PO TABS: 250.0000 mg | ORAL_TABLET | Freq: Every day | ORAL | Status: DC

## 2013-12-27 MED ORDER — ACETAMINOPHEN 325 MG PO TABS
650.0000 mg | ORAL_TABLET | Freq: Once | ORAL | Status: AC
  Administered 2013-12-27: 650 mg via ORAL
  Filled 2013-12-27: qty 2

## 2013-12-27 NOTE — ED Provider Notes (Signed)
CSN: [id number]     Arrival date & time 12/27/13  0810 History   First MD Initiated Contact with Patient 12/27/13 0815     Chief Complaint  Patient presents with  . Fatigue     (Consider location/radiation/quality/duration/timing/severity/associated sxs/prior Treatment) Patient is a 73 y.o. male presenting with weakness. The history is provided by the patient.  Weakness This is a new problem. The current episode started in the past 7 days. The problem occurs constantly. The problem has been unchanged. Associated symptoms include congestion, coughing, diaphoresis, fatigue, headaches, neck pain and weakness. Pertinent negatives include no abdominal pain, anorexia, change in bowel habit, chills, fever, myalgias, nausea, rash, sore throat, swollen glands, urinary symptoms, visual change or vomiting. Nothing aggravates the symptoms. Treatments tried: OTC medication for cough.   [name] is a 73 y.o. male who presents to the ED with feeling tired.  Patient states that for the past 5 days he has felt weak and dizzy. He has had a headache off and on. He has a cough that is productive in the morning with green sputum. He has had some burning in his chest but has reflux and it feels the same as usual.  He has taken OTC medication for the cough but nothing for the headache.  Patient walked to the hospital from his house that he states is not to far away. PMH significant for multiple chronic medical problems as listed below.  Past Medical History  Diagnosis Date  . Hyperlipidemia Since 1995    takes Simvastatin daily  . Herpes   . Hypertension Since 1995    takes Metoprolol and Ramipril daily  . Sleep apnea     doesn't use CPAP  . Pneumonia     hx of;as a child  . Arthritis     fingers  . Chronic back pain   . GERD (gastroesophageal reflux disease)     takes Dexilant and Omeprazole daily  . History of gastric ulcer 25+yrs ago  . History of colon polyps   . Urinary urgency     takes  Flomax daily  . Urinary frequency   . Diabetes mellitus Since 1995    Takes Glucovance and Onglyza daily  . Diabetes mellitus without complication   . Hypertension    Past Surgical History  Procedure Laterality Date  . Cervical spine surgery    . Rotator cuff repair      Right  . Wrist surgery      Left  . Hand surgery      Right  . Penile prosthesis implant    . Back surgery  1962    Lumbar  . Bilateral cataract surgery    . Cardiac catheterization  02/16/10  . Colonoscopy    . Esophagogastroduodenoscopy (egd) with esophageal dilation    . Lumbar laminectomy/decompression microdiscectomy N/A 08/29/2012    Procedure: LUMBAR LAMINECTOMY/DECOMPRESSION MICRODISCECTOMY 1 LEVEL;  Surgeon: Floyce Stakes, MD;  Location: [place] [place] [facility];  Service: Neurosurgery;  Laterality: N/A;  Lumbar four-five laminectomies  . Colonoscopy N/A 04/23/2013    Procedure: COLONOSCOPY;  Surgeon: Rogene Houston, MD;  Location: [facility];  Service: Endoscopy;  Laterality: N/A;  1030  . Back surgery    . Neck surgery    . Shoulder surgery    . Wrist surgery     Family History  Problem Relation Age of Onset  . Heart attack Father 78    MI  . Hypertension Father   . Heart attack Brother  50    MI  . Heart attack Paternal Uncle   . Diabetes Mother    History  Substance Use Topics  . Smoking status: Never Smoker   . Smokeless tobacco: Never Used  . Alcohol Use: Yes     Comment: occasionally beer    Review of Systems  Constitutional: Positive for diaphoresis and fatigue. Negative for fever and chills.  HENT: Positive for congestion. Negative for sore throat.   Eyes: Negative for visual disturbance.  Respiratory: Positive for cough.   Gastrointestinal: Negative for nausea, vomiting, abdominal pain, anorexia and change in bowel habit.  Genitourinary: Negative for dysuria, urgency and frequency.  Musculoskeletal: Positive for neck pain. Negative for myalgias.  Skin: Negative for rash.   Neurological: Positive for weakness, light-headedness and headaches. Negative for syncope.  Psychiatric/Behavioral: Negative for confusion. The patient is not nervous/anxious.       Allergies  Aspirin and Bayer aspirin  Home Medications   Prior to Admission medications   Medication Sig Start Date End Date Taking? Authorizing Provider  amoxicillin (AMOXIL) 500 MG capsule Take 1 capsule (500 mg total) by mouth 3 (three) times daily. 06/20/13   Ezequiel Essex, MD  aspirin 81 MG EC tablet Take 81 mg by mouth daily.      Historical Provider, MD  glyBURIDE-metformin (GLUCOVANCE) 5-500 MG per tablet Take 2 tablets by mouth 2 (two) times daily with a meal.     Historical Provider, MD  LANTUS SOLOSTAR 100 UNIT/ML SOPN Inject 12 Units into the skin at bedtime. 04/15/13   Historical Provider, MD  metoprolol (TOPROL-XL) 50 MG 24 hr tablet Take 50 mg by mouth daily.      Historical Provider, MD  Multiple Vitamin (MULTIVITAMIN) tablet Take 1 tablet by mouth daily.    Historical Provider, MD  omeprazole (PRILOSEC) 40 MG capsule Take 40 mg by mouth daily.    Historical Provider, MD  oxyCODONE (OXY IR/ROXICODONE) 5 MG immediate release tablet Take 5 mg by mouth 3 (three) times daily as needed. For pain 06/16/13   Historical Provider, MD  polyethylene glycol (MIRALAX) packet Take 17 g by mouth daily. 06/20/13   Ezequiel Essex, MD  ramipril (ALTACE) 10 MG tablet Take 10 mg by mouth daily.    Historical Provider, MD  simvastatin (ZOCOR) 20 MG tablet Take 20 mg by mouth daily. 04/15/13   Historical Provider, MD  Tamsulosin HCl (FLOMAX) 0.4 MG CAPS Take 0.4 mg by mouth daily.    Historical Provider, MD   BP 140/81  Pulse 67  Temp(Src) 97.7 F (36.5 C) (Oral)  Resp 18  Ht 5\' 10"  (1.778 m)  Wt 150 lb (68.04 kg)  BMI 21.52 kg/m2  SpO2 100% Physical Exam  Nursing note and vitals reviewed. Constitutional: He is oriented to person, place, and time. He appears well-developed and well-nourished. No distress.   HENT:  Head: Normocephalic.  Eyes: EOM are normal.  Neck: Trachea normal and normal range of motion. Neck supple. No JVD present.  Cardiovascular: Normal rate and regular rhythm.   Pulmonary/Chest: Effort normal. He has no wheezes. He has no rales.  Abdominal: Soft. Bowel sounds are normal. There is no tenderness.  Musculoskeletal: Normal range of motion.  Neurological: He is alert and oriented to person, place, and time. He has normal strength. No cranial nerve deficit or sensory deficit. Gait normal.  Good grips and equal, adequate circulation. Equal strength lower extremities, no edema.   Skin: Skin is warm and dry.  Psychiatric: He has a normal  mood and affect. His behavior is normal.   Results for orders placed during the hospital encounter of 12/27/13 (from the past 24 hour(s))  CBC WITH DIFFERENTIAL     Status: Abnormal   Collection Time    12/27/13  8:43 AM      Result Value Ref Range   WBC 3.2 (*) 4.0 - 10.5 K/uL   RBC 3.94 (*) 4.22 - 5.81 MIL/uL   Hemoglobin 12.7 (*) 13.0 - 17.0 g/dL   HCT 35.4 (*) 39.0 - 52.0 %   MCV 89.8  78.0 - 100.0 fL   MCH 32.2  26.0 - 34.0 pg   MCHC 35.9  30.0 - 36.0 g/dL   RDW 12.2  11.5 - 15.5 %   Platelets 165  150 - 400 K/uL   Neutrophils Relative % 40 (*) 43 - 77 %   Neutro Abs 1.3 (*) 1.7 - 7.7 K/uL   Lymphocytes Relative 50 (*) 12 - 46 %   Lymphs Abs 1.6  0.7 - 4.0 K/uL   Monocytes Relative 6  3 - 12 %   Monocytes Absolute 0.2  0.1 - 1.0 K/uL   Eosinophils Relative 3  0 - 5 %   Eosinophils Absolute 0.1  0.0 - 0.7 K/uL   Basophils Relative 1  0 - 1 %   Basophils Absolute 0.0  0.0 - 0.1 K/uL  COMPREHENSIVE METABOLIC PANEL     Status: Abnormal   Collection Time    12/27/13  8:43 AM      Result Value Ref Range   Sodium 139  137 - 147 mEq/L   Potassium 4.7  3.7 - 5.3 mEq/L   Chloride 100  96 - 112 mEq/L   CO2 28  19 - 32 mEq/L   Glucose, Bld 88  70 - 99 mg/dL   BUN 16  6 - 23 mg/dL   Creatinine, Ser 0.95  0.50 - 1.35 mg/dL    Calcium 9.8  8.4 - 10.5 mg/dL   Total Protein 7.2  6.0 - 8.3 g/dL   Albumin 3.8  3.5 - 5.2 g/dL   AST 17  0 - 37 U/L   ALT 12  0 - 53 U/L   Alkaline Phosphatase 41  39 - 117 U/L   Total Bilirubin 0.5  0.3 - 1.2 mg/dL   GFR calc non Af Amer 81 (*) >90 mL/min   GFR calc Af Amer >90  >90 mL/min  URINALYSIS, ROUTINE W REFLEX MICROSCOPIC     Status: Abnormal   Collection Time    12/27/13  8:50 AM      Result Value Ref Range   Color, Urine YELLOW  YELLOW   APPearance CLEAR  CLEAR   Specific Gravity, Urine >1.030 (*) 1.005 - 1.030   pH 5.0  5.0 - 8.0   Glucose, UA NEGATIVE  NEGATIVE mg/dL   Hgb urine dipstick SMALL (*) NEGATIVE   Bilirubin Urine NEGATIVE  NEGATIVE   Ketones, ur NEGATIVE  NEGATIVE mg/dL   Protein, ur NEGATIVE  NEGATIVE mg/dL   Urobilinogen, UA 0.2  0.0 - 1.0 mg/dL   Nitrite NEGATIVE  NEGATIVE   Leukocytes, UA NEGATIVE  NEGATIVE  URINE MICROSCOPIC-ADD ON     Status: None   Collection Time    12/27/13  8:50 AM      Result Value Ref Range   Squamous Epithelial / LPF RARE  RARE   WBC, UA 0-2  <3 WBC/hpf   RBC / HPF 3-6  <3 RBC/hpf  Bacteria, UA RARE  RARE   EKG Interpretation  Date/Time:  Sunday December 27 2013 08:34:29 EDT Ventricular Rate:  51 PR Interval:  160 QRS Duration: 78 QT Interval:  388 QTC Calculation: 357 R Axis:   28 Text Interpretation:  Sinus rhythm Borderline ST elevation, anterior leads Confirmed by Lacinda Axon  MD, BRIAN ([id number]) on 12/27/2013 8:53:44 AM  ED Course  Procedures  Dr. Lacinda Axon reviewed EKG and labs and spoke with the patient.  Dg Chest 2 View  12/27/2013   CLINICAL DATA:  Short of breath, congestion and weakness  EXAM: CHEST  2 VIEW  COMPARISON:  Prior chest x-ray 05/01/2013  FINDINGS: Cardiac and mediastinal contours remain within normal limits. The lungs are clear. No focal consolidation, pleural effusion or pneumothorax. No suspicious pulmonary nodule. No acute osseous abnormality. The lungs are very mildly hyperexpanded.  IMPRESSION: No  active cardiopulmonary disease.   Electronically Signed   By: Jacqulynn Cadet [name]   On: 12/27/2013 09:52    MDM  73 y.o. male with complain of feeling tired for the past few days. Cough and congestion and headache off and on. Feeling much better after tylenol and IV fluids. Stable for discharge with no active disease on chest x-ray, normal urine and CMET, hemoglobin stable. He will follow up with his PCP or return here as needed for worsening symptoms. Discussed with the patient and all questioned fully answered. Patient ambulatory at discharge without difficulty.   [place], [place] 12/27/13 [phone]

## 2013-12-27 NOTE — ED Provider Notes (Signed)
Medical screening examination/treatment/procedure(s) were conducted as a shared visit with non-physician practitioner(s) and myself.  I personally evaluated the patient during the encounter.   EKG Interpretation   Date/Time:  Sunday December 27 2013 08:34:29 EDT Ventricular Rate:  51 PR Interval:  160 QRS Duration: 78 QT Interval:  388 QTC Calculation: 357 R Axis:   28 Text Interpretation:  Sinus rhythm Borderline ST elevation, anterior leads  Confirmed by Nil Xiong  MD, Tony Friscia ([id number]) on 12/27/2013 8:53:44 AM     Pt is nontoxic.  Normal VS.  ambulatory without neuro deficits  Nat Christen, MD 12/27/13 [phone]

## 2013-12-27 NOTE — Discharge Instructions (Signed)
Your blood work, urine and Chest x-ray are normal. Be sure you are drinking plenty of fluids and staying out of the heat. Follow up with your doctor. Take tylenol if your head hurts. Return here as needed.   Bronchitis Bronchitis is swelling (inflammation) of the air tubes leading to your lungs (bronchi). This causes mucus and a cough. If the swelling gets bad, you may have trouble breathing. HOME CARE   Rest.  Drink enough fluids to keep your pee (urine) clear or pale yellow (unless you have a condition where you have to watch how much you drink).  Only take medicine as told by your doctor. If you were given antibiotic medicines, finish them even if you start to feel better.  Avoid smoke, irritating chemicals, and strong smells. These make the problem worse. Quit smoking if you smoke. This helps your lungs heal faster.  Use a cool mist humidifier. Change the water in the humidifier every day. You can also sit in the bathroom with hot shower running for 5-10 minutes. Keep the door closed.  See your health care provider as told.  Wash your hands often. GET HELP IF: Your problems do not get better after 1 week. GET HELP RIGHT AWAY IF:   Your fever gets worse.  You have chills.  Your chest hurts.  Your problems breathing get worse.  You have blood in your mucus.  You pass out (faint).  You feel light-headed.  You have a bad headache.  You throw up (vomit) again and again. MAKE SURE YOU:  Understand these instructions.  Will watch your condition.  Will get help right away if you are not doing well or get worse. Document Released: 12/12/2007 Document Revised: 06/30/2013 Document Reviewed: 02/17/2013 [facility] Patient Information 2015 [place], [place]. This information is not intended to replace advice given to you by your health care provider. Make sure you discuss any questions you have with your health care provider.

## 2013-12-27 NOTE — ED Notes (Signed)
Pt reports weakness,intermittent productive cough,headache since Wednesday. Pt alert and oriented in triage. nad noted. Pt denies any n/v/d.

## 2014-01-02 ENCOUNTER — Encounter (HOSPITAL_COMMUNITY): Payer: Self-pay | Admitting: Emergency Medicine

## 2014-01-02 ENCOUNTER — Emergency Department (HOSPITAL_COMMUNITY)
Admission: EM | Admit: 2014-01-02 | Discharge: 2014-01-02 | Disposition: A | Discharge status: Home / Self Care | Payer: Medicare HMO | Attending: Emergency Medicine | Admitting: Emergency Medicine

## 2014-01-02 DIAGNOSIS — Z7982 Long term (current) use of aspirin: Secondary | ICD-10-CM | POA: Insufficient documentation

## 2014-01-02 DIAGNOSIS — I10 Essential (primary) hypertension: Secondary | ICD-10-CM | POA: Insufficient documentation

## 2014-01-02 DIAGNOSIS — E162 Hypoglycemia, unspecified: Secondary | ICD-10-CM

## 2014-01-02 DIAGNOSIS — F29 Unspecified psychosis not due to a substance or known physiological condition: Secondary | ICD-10-CM | POA: Insufficient documentation

## 2014-01-02 DIAGNOSIS — Z79899 Other long term (current) drug therapy: Secondary | ICD-10-CM | POA: Insufficient documentation

## 2014-01-02 DIAGNOSIS — R29898 Other symptoms and signs involving the musculoskeletal system: Secondary | ICD-10-CM | POA: Insufficient documentation

## 2014-01-02 DIAGNOSIS — E785 Hyperlipidemia, unspecified: Secondary | ICD-10-CM | POA: Insufficient documentation

## 2014-01-02 DIAGNOSIS — Z860100 Personal history of colon polyps, unspecified: Secondary | ICD-10-CM | POA: Insufficient documentation

## 2014-01-02 DIAGNOSIS — Z8701 Personal history of pneumonia (recurrent): Secondary | ICD-10-CM | POA: Insufficient documentation

## 2014-01-02 DIAGNOSIS — R51 Headache: Secondary | ICD-10-CM | POA: Insufficient documentation

## 2014-01-02 DIAGNOSIS — Z791 Long term (current) use of non-steroidal anti-inflammatories (NSAID): Secondary | ICD-10-CM | POA: Insufficient documentation

## 2014-01-02 DIAGNOSIS — G8929 Other chronic pain: Secondary | ICD-10-CM | POA: Insufficient documentation

## 2014-01-02 DIAGNOSIS — K219 Gastro-esophageal reflux disease without esophagitis: Secondary | ICD-10-CM | POA: Insufficient documentation

## 2014-01-02 DIAGNOSIS — Z792 Long term (current) use of antibiotics: Secondary | ICD-10-CM | POA: Insufficient documentation

## 2014-01-02 DIAGNOSIS — E1169 Type 2 diabetes mellitus with other specified complication: Secondary | ICD-10-CM | POA: Insufficient documentation

## 2014-01-02 DIAGNOSIS — Z794 Long term (current) use of insulin: Secondary | ICD-10-CM | POA: Insufficient documentation

## 2014-01-02 DIAGNOSIS — Z8619 Personal history of other infectious and parasitic diseases: Secondary | ICD-10-CM | POA: Insufficient documentation

## 2014-01-02 LAB — CBC WITH DIFFERENTIAL/PLATELET
BASOS ABS: 0 10*3/uL (ref 0.0–0.1)
Basophils Relative: 0 % (ref 0–1)
EOS PCT: 1 % (ref 0–5)
Eosinophils Absolute: 0 10*3/uL (ref 0.0–0.7)
HCT: 33.6 % — ABNORMAL LOW (ref 39.0–52.0)
Hemoglobin: 11.9 g/dL — ABNORMAL LOW (ref 13.0–17.0)
Lymphocytes Relative: 35 % (ref 12–46)
Lymphs Abs: 1.1 10*3/uL (ref 0.7–4.0)
MCH: 31.8 pg (ref 26.0–34.0)
MCHC: 35.4 g/dL (ref 30.0–36.0)
MCV: 89.8 fL (ref 78.0–100.0)
MONO ABS: 0.1 10*3/uL (ref 0.1–1.0)
Monocytes Relative: 5 % (ref 3–12)
Neutro Abs: 1.8 10*3/uL (ref 1.7–7.7)
Neutrophils Relative %: 59 % (ref 43–77)
Platelets: 170 10*3/uL (ref 150–400)
RBC: 3.74 MIL/uL — ABNORMAL LOW (ref 4.22–5.81)
RDW: 12.4 % (ref 11.5–15.5)
WBC: 3.1 10*3/uL — ABNORMAL LOW (ref 4.0–10.5)

## 2014-01-02 LAB — BASIC METABOLIC PANEL
BUN: 13 mg/dL (ref 6–23)
CALCIUM: 9.2 mg/dL (ref 8.4–10.5)
CO2: 27 mEq/L (ref 19–32)
CREATININE: 0.8 mg/dL (ref 0.50–1.35)
Chloride: 102 mEq/L (ref 96–112)
GFR calc Af Amer: >90 mL/min (ref >90)
GFR, EST NON AFRICAN AMERICAN: 87 mL/min — AB (ref >90)
Glucose, Bld: 192 mg/dL — ABNORMAL HIGH (ref 70–99)
Potassium: 4.3 mEq/L (ref 3.7–5.3)
SODIUM: 140 meq/L (ref 137–147)

## 2014-01-02 LAB — CBG MONITORING, ED
GLUCOSE-CAPILLARY: 177 mg/dL — AB (ref 70–99)
Glucose-Capillary: 155 mg/dL — ABNORMAL HIGH (ref 70–99)

## 2014-01-02 NOTE — ED Provider Notes (Addendum)
CSN: [id number]     Arrival date & time 01/02/14  1225 History   First MD Initiated Contact with Patient 01/02/14 1246     Chief Complaint  Patient presents with  . Hypoglycemia  . Headache     (Consider location/radiation/quality/duration/timing/severity/associated sxs/prior Treatment) The history is provided by the patient and the EMS personnel.   73 year old male brought in by EMS for low blood sugar. Patient was confused at home sitting on the floor blood sugar was 35. EMS gave him in one half of D50 patient blood sugar improved significantly patient woke up. Was noted to have some PACs on the monitor. Patient had no specific complaints other than a mild headache. Headache been present for 2 weeks. Patient stated that he denied he anything all morning. Also do not get his evening snack because he fell asleep early yesterday. Patient felt cold particle morning. Patient now feels much better. Patient denies chest pain abdominal pain shortness of breath fever or chills.  Past Medical History  Diagnosis Date  . Hyperlipidemia Since 1995    takes Simvastatin daily  . Herpes   . Hypertension Since 1995    takes Metoprolol and Ramipril daily  . Sleep apnea     doesn't use CPAP  . Pneumonia     hx of;as a child  . Arthritis     fingers  . Chronic back pain   . GERD (gastroesophageal reflux disease)     takes Dexilant and Omeprazole daily  . History of gastric ulcer 25+yrs ago  . History of colon polyps   . Urinary urgency     takes Flomax daily  . Urinary frequency   . Diabetes mellitus Since 1995    Takes Glucovance and Onglyza daily  . Diabetes mellitus without complication   . Hypertension    Past Surgical History  Procedure Laterality Date  . Cervical spine surgery    . Rotator cuff repair      Right  . Wrist surgery      Left  . Hand surgery      Right  . Penile prosthesis implant    . Back surgery  1962    Lumbar  . Bilateral cataract surgery    . Cardiac  catheterization  02/16/10  . Colonoscopy    . Esophagogastroduodenoscopy (egd) with esophageal dilation    . Lumbar laminectomy/decompression microdiscectomy N/A 08/29/2012    Procedure: LUMBAR LAMINECTOMY/DECOMPRESSION MICRODISCECTOMY 1 LEVEL;  Surgeon: Floyce Stakes, MD;  Location: [place] [facility];  Service: Neurosurgery;  Laterality: N/A;  Lumbar four-five laminectomies  . Colonoscopy N/A 04/23/2013    Procedure: COLONOSCOPY;  Surgeon: Rogene Houston, MD;  [facility]: [facility];  Service: Endoscopy;  Laterality: N/A;  1030  . Back surgery    . Neck surgery    . Shoulder surgery    . Wrist surgery     Family History  Problem Relation Age of Onset  . Heart attack Father 71    MI  . Hypertension Father   . Heart attack Brother 10    MI  . Heart attack Paternal Uncle   . Diabetes Mother    History  Substance Use Topics  . Smoking status: Never Smoker   . Smokeless tobacco: Never Used  . Alcohol Use: Yes     Comment: occasionally beer    Review of Systems  Constitutional: Negative for fever and chills.  HENT: Negative for congestion.   Respiratory: Negative for shortness of breath.  Cardiovascular: Negative for chest pain.  Gastrointestinal: Negative for nausea, vomiting and abdominal pain.  Genitourinary: Negative for dysuria.  Musculoskeletal: Negative for back pain and neck pain.  Skin: Negative for rash.  Neurological: Positive for headaches.  Hematological: Does not bruise/bleed easily.  Psychiatric/Behavioral: Positive for confusion.      Allergies  Aspirin and Bayer aspirin  Home Medications   Prior to Admission medications   Medication Sig Start Date End Date Taking? Authorizing Provider  aspirin 81 MG EC tablet Take 81 mg by mouth daily.     Yes Historical Provider, MD  glyBURIDE-metformin (GLUCOVANCE) 5-500 MG per tablet Take 2 tablets by mouth 2 (two) times daily with a meal.    Yes Historical Provider, MD  guaifenesin (ROBITUSSIN) 100 MG/5ML syrup  Take 5-10 mLs (100-200 mg total) by mouth every 4 (four) hours as needed for cough. 12/27/13  Yes Hope Bunnie Pion, NP  metoprolol (TOPROL-XL) 50 MG 24 hr tablet Take 50 mg by mouth daily.     Yes Historical Provider, MD  naproxen (NAPROSYN) 500 MG tablet Take 500 mg by mouth 2 (two) times daily with a meal.   Yes Historical Provider, MD  omeprazole (PRILOSEC) 40 MG capsule Take 40 mg by mouth daily.   Yes Historical Provider, MD  oxyCODONE (OXY IR/ROXICODONE) 5 MG immediate release tablet Take 5 mg by mouth 3 (three) times daily as needed for moderate pain. For pain 06/16/13  Yes Historical Provider, MD  simvastatin (ZOCOR) 20 MG tablet Take 20 mg by mouth daily. 04/15/13  Yes Historical Provider, MD  tamsulosin (FLOMAX) 0.4 MG CAPS capsule Take 0.4 mg by mouth daily.   Yes Historical Provider, MD  azithromycin (ZITHROMAX) 250 MG tablet Take 1 tablet (250 mg total) by mouth daily. Take first 2 tablets together, then 1 every day until finished. 12/27/13   Hope Bunnie Pion, NP  LANTUS SOLOSTAR 100 UNIT/ML SOPN Inject 12 Units into the skin at bedtime. 04/15/13   Historical Provider, MD   BP 183/88  Pulse 63  Temp(Src) 97.8 F (36.6 C) (Oral)  Resp 19  Ht 5\' 10"  (1.778 m)  Wt 152 lb (68.947 kg)  BMI 21.81 kg/m2  SpO2 100% Physical Exam  Nursing note and vitals reviewed. Constitutional: He is oriented to person, place, and time. He appears well-developed and well-nourished. No distress.  HENT:  Head: Normocephalic and atraumatic.  Mouth/Throat: Oropharynx is clear and moist.  Eyes: Conjunctivae and EOM are normal. Pupils are equal, round, and reactive to light.  Neck: Normal range of motion.  Cardiovascular: Normal rate, regular rhythm and normal heart sounds.   No murmur heard. Pulmonary/Chest: Effort normal and breath sounds normal. No respiratory distress.  Abdominal: Soft. Bowel sounds are normal. There is no tenderness.  Neurological: He is oriented to person, place, and time. No cranial nerve  deficit. He exhibits normal muscle tone. Coordination normal.  Skin: Skin is warm. No rash noted.    ED Course  Procedures (including critical care time) Labs Review Labs Reviewed  CBC WITH DIFFERENTIAL - Abnormal; Notable for the following:    WBC 3.1 (*)    RBC 3.74 (*)    Hemoglobin 11.9 (*)    HCT 33.6 (*)    All other components within normal limits  BASIC METABOLIC PANEL - Abnormal; Notable for the following:    Glucose, Bld 192 (*)    GFR calc non Af Amer 87 (*)    All other components within normal limits  CBG MONITORING, ED -  Abnormal; Notable for the following:    Glucose-Capillary 155 (*)    All other components within normal limits  CBG MONITORING, ED   Results for orders placed during the hospital encounter of 01/02/14  CBC WITH DIFFERENTIAL      Result Value Ref Range   WBC 3.1 (*) 4.0 - 10.5 K/uL   RBC 3.74 (*) 4.22 - 5.81 MIL/uL   Hemoglobin 11.9 (*) 13.0 - 17.0 g/dL   HCT 33.6 (*) 39.0 - 52.0 %   MCV 89.8  78.0 - 100.0 fL   MCH 31.8  26.0 - 34.0 pg   MCHC 35.4  30.0 - 36.0 g/dL   RDW 12.4  11.5 - 15.5 %   Platelets 170  150 - 400 K/uL   Neutrophils Relative % 59  43 - 77 %   Neutro Abs 1.8  1.7 - 7.7 K/uL   Lymphocytes Relative 35  12 - 46 %   Lymphs Abs 1.1  0.7 - 4.0 K/uL   Monocytes Relative 5  3 - 12 %   Monocytes Absolute 0.1  0.1 - 1.0 K/uL   Eosinophils Relative 1  0 - 5 %   Eosinophils Absolute 0.0  0.0 - 0.7 K/uL   Basophils Relative 0  0 - 1 %   Basophils Absolute 0.0  0.0 - 0.1 K/uL  BASIC METABOLIC PANEL      Result Value Ref Range   Sodium 140  137 - 147 mEq/L   Potassium 4.3  3.7 - 5.3 mEq/L   Chloride 102  96 - 112 mEq/L   CO2 27  19 - 32 mEq/L   Glucose, Bld 192 (*) 70 - 99 mg/dL   BUN 13  6 - 23 mg/dL   Creatinine, Ser 0.80  0.50 - 1.35 mg/dL   Calcium 9.2  8.4 - 10.5 mg/dL   GFR calc non Af Amer 87 (*) >90 mL/min   GFR calc Af Amer >90  >90 mL/min  CBG MONITORING, ED      Result Value Ref Range   Glucose-Capillary 155 (*)  70 - 99 mg/dL   Results for orders placed during the hospital encounter of 01/02/14  CBC WITH DIFFERENTIAL      Result Value Ref Range   WBC 3.1 (*) 4.0 - 10.5 K/uL   RBC 3.74 (*) 4.22 - 5.81 MIL/uL   Hemoglobin 11.9 (*) 13.0 - 17.0 g/dL   HCT 33.6 (*) 39.0 - 52.0 %   MCV 89.8  78.0 - 100.0 fL   MCH 31.8  26.0 - 34.0 pg   MCHC 35.4  30.0 - 36.0 g/dL   RDW 12.4  11.5 - 15.5 %   Platelets 170  150 - 400 K/uL   Neutrophils Relative % 59  43 - 77 %   Neutro Abs 1.8  1.7 - 7.7 K/uL   Lymphocytes Relative 35  12 - 46 %   Lymphs Abs 1.1  0.7 - 4.0 K/uL   Monocytes Relative 5  3 - 12 %   Monocytes Absolute 0.1  0.1 - 1.0 K/uL   Eosinophils Relative 1  0 - 5 %   Eosinophils Absolute 0.0  0.0 - 0.7 K/uL   Basophils Relative 0  0 - 1 %   Basophils Absolute 0.0  0.0 - 0.1 K/uL  BASIC METABOLIC PANEL      Result Value Ref Range   Sodium 140  137 - 147 mEq/L   Potassium 4.3  3.7 - 5.3 mEq/L  Chloride 102  96 - 112 mEq/L   CO2 27  19 - 32 mEq/L   Glucose, Bld 192 (*) 70 - 99 mg/dL   BUN 13  6 - 23 mg/dL   Creatinine, Ser 0.80  0.50 - 1.35 mg/dL   Calcium 9.2  8.4 - 10.5 mg/dL   GFR calc non Af Amer 87 (*) >90 mL/min   GFR calc Af Amer >90  >90 mL/min  CBG MONITORING, ED      Result Value Ref Range   Glucose-Capillary 155 (*) 70 - 99 mg/dL  CBG MONITORING, ED      Result Value Ref Range   Glucose-Capillary 177 (*) 70 - 99 mg/dL     Imaging Review No results found.   EKG Interpretation None      Date: 01/02/2014  Rate: 51  Rhythm: sinus bradycardia  QRS Axis: normal  Intervals: normal  ST/T Wave abnormalities: nonspecific ST/T changes  Conduction Disutrbances:none  Narrative Interpretation:   Old EKG Reviewed: none available Isolated ST segment elevation in V3 and V4 with good J-point.     MDM   Final diagnoses:  Hypoglycemia   Patient brought in by EMS for low blood sugar. Patient blood sugar home went down to 35 patient was very confused no loss of  consciousness. Patient given a Of D50 blood sugar came up to 2 sandwiches at home, they noticed blood sugar was certainly dropped again so he was brought in here for evaluation. First blood sugar here is 192 patient's had a snack for wound check another fingerstick blood sugar if it remains stable he can be discharged home. His only other complaint is a mild headache. Patient denies any other pain or any injuries to his legs or arms. He did not fall. Patient stated that he did not need anything all morning and he also fell asleep last night before he had his evening snack. This probably explains the low blood sugar. Patient feels much better now.  Patient maintaining his blood sugar at 177 to be discharged. Followup with primary care Dr. Patient will make sure he eats snacks and eats his meals.    Fredia Sorrow, MD 01/02/14 [place], MD 01/02/14 [place], MD 01/02/14 [phone]

## 2014-01-02 NOTE — ED Notes (Signed)
Pt does not remember prior to taking glucovance last night. Woke up this morning lethargic. Upon EMS arrival pt's BGL was 35 mg/dL, after 1 amp of D50 and 2 sandwiches pt's BGL was 125 mg/dL. Pt also complaining of headache x 2 week

## 2014-01-02 NOTE — Discharge Instructions (Signed)
Low Blood Sugar Low blood sugar (hypoglycemia) means that the level of sugar in your blood is lower than it should be. Signs of low blood sugar include:  Getting sweaty.  Feeling hungry.  Feeling dizzy or weak.  Feeling sleepier than normal.  Feeling nervous.  Headaches.  Having a fast heartbeat. Low blood sugar can happen fast and can be an emergency. Your doctor can do tests to check your blood sugar level. You can have low blood sugar and not have diabetes. HOME CARE  Check your blood sugar as told by your doctor. If it is less than 70 mg/dl or as told by your doctor, take 1 of the following:  3 to 4 glucose tablets.   cup clear juice.   cup soda pop, not diet.  1 cup milk.  5 to 6 hard candies.  Recheck blood sugar after 15 minutes. Repeat until it is at the right level.  Eat a snack if it is more than 1 hour until the next meal.  Only take medicine as told by your doctor.  Do not skip meals. Eat on time.  Do not drink alcohol except with meals.  Check your blood glucose before driving.  Check your blood glucose before and after exercise.  Always carry treatment with you, such as glucose pills.  Always wear a medical alert bracelet if you have diabetes. GET HELP RIGHT AWAY IF:   Your blood glucose goes below 70 mg/dl or as told by your doctor, and you:  Are confused.  Are not able to swallow.  Pass out (faint).  You cannot treat yourself. You may need someone to help you.  You have low blood sugar problems often.  You have problems from your medicines.  You are not feeling better after 3 to 4 days.  You have vision changes. MAKE SURE YOU:   Understand these instructions.  Will watch this condition.  Will get help right away if you are not doing well or get worse. Document Released: 09/19/2009 Document Revised: 09/17/2011 Document Reviewed: 09/19/2009 [facility] Patient Information 2015 [place], [place]. This information is not intended to  replace advice given to you by your health care provider. Make sure you discuss any questions you have with your health care provider.  Petra Kuba you eat and gets snacks. Make appointment to followup with your doctor sometime over the next several days just for recheck. Return for any new or worse symptoms.

## 2014-01-07 ENCOUNTER — Ambulatory Visit (INDEPENDENT_AMBULATORY_CARE_PROVIDER_SITE_OTHER): Payer: Medicare HMO | Admitting: Otolaryngology

## 2014-01-07 DIAGNOSIS — J342 Deviated nasal septum: Secondary | ICD-10-CM

## 2014-02-04 ENCOUNTER — Other Ambulatory Visit (HOSPITAL_COMMUNITY): Payer: Self-pay | Admitting: Family Medicine

## 2014-02-04 DIAGNOSIS — G43709 Chronic migraine without aura, not intractable, without status migrainosus: Secondary | ICD-10-CM

## 2014-02-04 DIAGNOSIS — IMO0002 Reserved for concepts with insufficient information to code with codable children: Secondary | ICD-10-CM

## 2014-02-05 ENCOUNTER — Ambulatory Visit (HOSPITAL_COMMUNITY)
Admission: RE | Admit: 2014-02-05 | Discharge: 2014-02-05 | Disposition: A | Discharge status: Home / Self Care | Payer: Medicare HMO | Source: Ambulatory Visit | Attending: Family Medicine | Admitting: Family Medicine

## 2014-02-05 DIAGNOSIS — G43709 Chronic migraine without aura, not intractable, without status migrainosus: Secondary | ICD-10-CM | POA: Diagnosis present

## 2014-02-05 DIAGNOSIS — IMO0002 Reserved for concepts with insufficient information to code with codable children: Secondary | ICD-10-CM

## 2014-02-08 ENCOUNTER — Encounter (INDEPENDENT_AMBULATORY_CARE_PROVIDER_SITE_OTHER): Payer: Self-pay | Admitting: *Deleted

## 2014-02-08 ENCOUNTER — Ambulatory Visit (INDEPENDENT_AMBULATORY_CARE_PROVIDER_SITE_OTHER): Payer: Medicare HMO | Admitting: Internal Medicine

## 2014-02-08 ENCOUNTER — Encounter (INDEPENDENT_AMBULATORY_CARE_PROVIDER_SITE_OTHER): Payer: Self-pay | Admitting: Internal Medicine

## 2014-02-08 VITALS — BP 118/78 | HR 72 | Temp 98.1°F | Resp 18 | Ht 70.0 in | Wt 153.2 lb

## 2014-02-08 DIAGNOSIS — K838 Other specified diseases of biliary tract: Secondary | ICD-10-CM

## 2014-02-08 DIAGNOSIS — K219 Gastro-esophageal reflux disease without esophagitis: Secondary | ICD-10-CM

## 2014-02-08 NOTE — Progress Notes (Signed)
Presenting complaint;  Followup for GERD and dilated bile duct.  Subjective:  Patient is 73 year old African male who is here for scheduled visit. He was last seen 6 months ago. He tells me that he had an episode of mid abdominal pain with nausea and diarrhea about 2 weeks ago. These symptoms cleared after one day. The symptoms are triggered by fish that he ate that day. His appetite is fair. He states he forgot to eat his meal after he took his diabetic medications and developed hypoglycemic syncope and ended up in the emergency room(01/02/2014). He also complains of intermittent headache. Head CT by Dr. Lorriane Shire and has a followup visit. He states his heartburn is well controlled with therapy. He has dysphagia primarily with steak when he does not chew his food and eats fast. He has not had any episode of food impaction. He has shoulder pain for which she is taking Naprosyn but not every day. He has noted if he takes Naprosyn twice daily he develops abdominal discomfort. He denies melena or rectal bleeding. His weight is up by 3 pounds since his last visit.          Current Medications: Outpatient Encounter Prescriptions as of 02/08/2014  Medication Sig  . acyclovir (ZOVIRAX) 200 MG capsule Take 200 mg by mouth as needed.   Marland Kitchen aspirin 81 MG EC tablet Take 81 mg by mouth daily.    Marland Kitchen glyBURIDE-metformin (GLUCOVANCE) 5-500 MG per tablet Take 2 tablets by mouth 2 (two) times daily with a meal.   . LANTUS SOLOSTAR 100 UNIT/ML SOPN Inject 12 Units into the skin at bedtime.  . metoprolol (TOPROL-XL) 50 MG 24 hr tablet Take 50 mg by mouth daily.    . naproxen (NAPROSYN) 500 MG tablet Take 500 mg by mouth 2 (two) times daily with a meal.  . omeprazole (PRILOSEC) 40 MG capsule Take 40 mg by mouth daily.  Marland Kitchen oxyCODONE (OXY IR/ROXICODONE) 5 MG immediate release tablet Take 5 mg by mouth 3 (three) times daily as needed for moderate pain. For pain  . ramipril (ALTACE) 10 MG capsule Take 10 mg by  mouth daily.   . simvastatin (ZOCOR) 20 MG tablet Take 20 mg by mouth daily.  . tamsulosin (FLOMAX) 0.4 MG CAPS capsule Take 0.4 mg by mouth daily.  . [DISCONTINUED] azithromycin (ZITHROMAX) 250 MG tablet Take 1 tablet (250 mg total) by mouth daily. Take first 2 tablets together, then 1 every day until finished.  . [DISCONTINUED] guaifenesin (ROBITUSSIN) 100 MG/5ML syrup Take 5-10 mLs (100-200 mg total) by mouth every 4 (four) hours as needed for cough.     Objective: Blood pressure 118/78, pulse 72, temperature 98.1 F (36.7 C), temperature source Oral, resp. rate 18, height 5\' 10"  (1.778 m), weight 153 lb 3.2 oz (69.491 kg). Patient is alert and in no acute distress. Conjunctiva is pink. Sclera is nonicteric Oropharyngeal mucosa is normal. No neck masses or thyromegaly noted. Cardiac exam with regular rhythm normal S1 and S2. No murmur or gallop noted. Lungs are clear to auscultation. Abdomen is flat soft and nontender without organomegaly or masses. No LE edema or clubbing noted.  Labs/studies Results: LFTs from 02/04/2014  Bilirubin 0.4, a PEEP 37, AST 19, ALT 19, total protein 7.2 and albumin 4.2.     Assessment:  #1. GERD. Hypertension well controlled with therapy. #2. Dysphagia. He has history of distal esophageal ring. His esophagus was last dilated in February 2012. He is having sporadic dysphagia primarily with steak. Patient  advised to chew his food thoroughly. If dysphagia progresses will consider esophageal dilation. #3. History of mildly dilated common bile. Incidentally he also has history of mildly dilated pancreatic duct. LFTs are normal. Last ultrasound also showed gallbladder polyps. Study needs to be repeated to make sure both of these changes are stable. #4. History of colonic adenomas. Last colonoscopy was in October 2014. Next colonoscopy would be due in October 2019.   Plan:  Upper abdominal ultrasound. Office visit in one year unless patient develops  dysphagia.

## 2014-02-08 NOTE — Patient Instructions (Addendum)
Physician will call with results of ultrasound when completed. Call if swallowing difficulty gets worse.

## 2014-02-12 ENCOUNTER — Ambulatory Visit (HOSPITAL_COMMUNITY)
Admission: RE | Admit: 2014-02-12 | Discharge: 2014-02-12 | Disposition: A | Discharge status: Home / Self Care | Payer: Medicare HMO | Source: Ambulatory Visit | Attending: Internal Medicine | Admitting: Internal Medicine

## 2014-02-12 DIAGNOSIS — K824 Cholesterolosis of gallbladder: Secondary | ICD-10-CM | POA: Diagnosis not present

## 2014-02-12 DIAGNOSIS — K838 Other specified diseases of biliary tract: Secondary | ICD-10-CM | POA: Insufficient documentation

## 2014-02-19 ENCOUNTER — Telehealth (INDEPENDENT_AMBULATORY_CARE_PROVIDER_SITE_OTHER): Payer: Self-pay | Admitting: *Deleted

## 2014-02-19 DIAGNOSIS — K838 Other specified diseases of biliary tract: Secondary | ICD-10-CM

## 2014-02-19 NOTE — Telephone Encounter (Signed)
Per Dr.Rehman the patient will need to have labs drawn in 1 week , the week of August 17,2015. Lab order has been released and faxed.

## 2014-02-22 LAB — HEPATIC FUNCTION PANEL
ALBUMIN: 4.1 g/dL (ref 3.5–5.2)
ALT: 20 U/L (ref 0–53)
AST: 22 U/L (ref 0–37)
Alkaline Phosphatase: 35 U/L — ABNORMAL LOW (ref 39–117)
BILIRUBIN TOTAL: 0.3 mg/dL (ref 0.2–1.2)
Bilirubin, Direct: 0.1 mg/dL (ref 0.0–0.3)
Indirect Bilirubin: 0.2 mg/dL (ref 0.2–1.2)
TOTAL PROTEIN: 6.8 g/dL (ref 6.0–8.3)

## 2014-03-01 ENCOUNTER — Encounter (INDEPENDENT_AMBULATORY_CARE_PROVIDER_SITE_OTHER): Payer: Self-pay

## 2014-04-11 ENCOUNTER — Emergency Department (HOSPITAL_COMMUNITY): Payer: Medicare HMO

## 2014-04-11 ENCOUNTER — Emergency Department (HOSPITAL_COMMUNITY)
Admission: EM | Admit: 2014-04-11 | Discharge: 2014-04-11 | Disposition: A | Discharge status: Home / Self Care | Payer: Medicare HMO | Attending: Emergency Medicine | Admitting: Emergency Medicine

## 2014-04-11 ENCOUNTER — Encounter (HOSPITAL_COMMUNITY): Payer: Self-pay | Admitting: Emergency Medicine

## 2014-04-11 DIAGNOSIS — Z7982 Long term (current) use of aspirin: Secondary | ICD-10-CM | POA: Diagnosis not present

## 2014-04-11 DIAGNOSIS — E119 Type 2 diabetes mellitus without complications: Secondary | ICD-10-CM | POA: Insufficient documentation

## 2014-04-11 DIAGNOSIS — Z79899 Other long term (current) drug therapy: Secondary | ICD-10-CM | POA: Diagnosis not present

## 2014-04-11 DIAGNOSIS — K219 Gastro-esophageal reflux disease without esophagitis: Secondary | ICD-10-CM | POA: Diagnosis not present

## 2014-04-11 DIAGNOSIS — Z8701 Personal history of pneumonia (recurrent): Secondary | ICD-10-CM | POA: Diagnosis not present

## 2014-04-11 DIAGNOSIS — I10 Essential (primary) hypertension: Secondary | ICD-10-CM | POA: Insufficient documentation

## 2014-04-11 DIAGNOSIS — G8929 Other chronic pain: Secondary | ICD-10-CM | POA: Insufficient documentation

## 2014-04-11 DIAGNOSIS — Z8619 Personal history of other infectious and parasitic diseases: Secondary | ICD-10-CM | POA: Diagnosis not present

## 2014-04-11 DIAGNOSIS — Z86010 Personal history of colonic polyps: Secondary | ICD-10-CM | POA: Diagnosis not present

## 2014-04-11 DIAGNOSIS — R519 Headache, unspecified: Secondary | ICD-10-CM

## 2014-04-11 DIAGNOSIS — Z9889 Other specified postprocedural states: Secondary | ICD-10-CM | POA: Insufficient documentation

## 2014-04-11 DIAGNOSIS — R51 Headache: Secondary | ICD-10-CM | POA: Insufficient documentation

## 2014-04-11 DIAGNOSIS — R42 Dizziness and giddiness: Secondary | ICD-10-CM | POA: Diagnosis present

## 2014-04-11 DIAGNOSIS — M1990 Unspecified osteoarthritis, unspecified site: Secondary | ICD-10-CM | POA: Insufficient documentation

## 2014-04-11 DIAGNOSIS — Z791 Long term (current) use of non-steroidal anti-inflammatories (NSAID): Secondary | ICD-10-CM | POA: Insufficient documentation

## 2014-04-11 DIAGNOSIS — E785 Hyperlipidemia, unspecified: Secondary | ICD-10-CM | POA: Insufficient documentation

## 2014-04-11 LAB — CBC WITH DIFFERENTIAL/PLATELET
BASOS ABS: 0 10*3/uL (ref 0.0–0.1)
Basophils Relative: 1 % (ref 0–1)
Eosinophils Absolute: 0.1 10*3/uL (ref 0.0–0.7)
Eosinophils Relative: 1 % (ref 0–5)
HEMATOCRIT: 32.7 % — AB (ref 39.0–52.0)
Hemoglobin: 11.6 g/dL — ABNORMAL LOW (ref 13.0–17.0)
LYMPHS PCT: 38 % (ref 12–46)
Lymphs Abs: 1.6 10*3/uL (ref 0.7–4.0)
MCH: 32.3 pg (ref 26.0–34.0)
MCHC: 35.5 g/dL (ref 30.0–36.0)
MCV: 91.1 fL (ref 78.0–100.0)
Monocytes Absolute: 0.3 10*3/uL (ref 0.1–1.0)
Monocytes Relative: 8 % (ref 3–12)
NEUTROS ABS: 2.2 10*3/uL (ref 1.7–7.7)
NEUTROS PCT: 52 % (ref 43–77)
Platelets: 163 10*3/uL (ref 150–400)
RBC: 3.59 MIL/uL — ABNORMAL LOW (ref 4.22–5.81)
RDW: 12.8 % (ref 11.5–15.5)
WBC: 4.2 10*3/uL (ref 4.0–10.5)

## 2014-04-11 LAB — URINALYSIS, ROUTINE W REFLEX MICROSCOPIC
BILIRUBIN URINE: NEGATIVE
Glucose, UA: NEGATIVE mg/dL
KETONES UR: NEGATIVE mg/dL
Leukocytes, UA: NEGATIVE
NITRITE: NEGATIVE
PROTEIN: NEGATIVE mg/dL
Specific Gravity, Urine: 1.015 (ref 1.005–1.030)
Urobilinogen, UA: 0.2 mg/dL (ref 0.0–1.0)
pH: 6 (ref 5.0–8.0)

## 2014-04-11 LAB — COMPREHENSIVE METABOLIC PANEL
ALK PHOS: 40 U/L (ref 39–117)
ALT: 18 U/L (ref 0–53)
AST: 17 U/L (ref 0–37)
Albumin: 3.7 g/dL (ref 3.5–5.2)
Anion gap: 9 (ref 5–15)
BILIRUBIN TOTAL: 0.3 mg/dL (ref 0.3–1.2)
BUN: 17 mg/dL (ref 6–23)
CHLORIDE: 105 meq/L (ref 96–112)
CO2: 28 meq/L (ref 19–32)
CREATININE: 0.99 mg/dL (ref 0.50–1.35)
Calcium: 9.5 mg/dL (ref 8.4–10.5)
GFR calc Af Amer: >90 mL/min (ref >90)
GFR, EST NON AFRICAN AMERICAN: 80 mL/min — AB (ref >90)
Glucose, Bld: 79 mg/dL (ref 70–99)
POTASSIUM: 4 meq/L (ref 3.7–5.3)
SODIUM: 142 meq/L (ref 137–147)
Total Protein: 7.1 g/dL (ref 6.0–8.3)

## 2014-04-11 LAB — URINE MICROSCOPIC-ADD ON

## 2014-04-11 LAB — TROPONIN I: Troponin I: <0.3 ng/mL (ref <0.30)

## 2014-04-11 MED ORDER — DIPHENHYDRAMINE HCL 25 MG PO CAPS
25.0000 mg | ORAL_CAPSULE | Freq: Once | ORAL | Status: AC
  Administered 2014-04-11: 25 mg via ORAL
  Filled 2014-04-11: qty 1

## 2014-04-11 MED ORDER — METOCLOPRAMIDE HCL 10 MG PO TABS
10.0000 mg | ORAL_TABLET | Freq: Once | ORAL | Status: AC
  Administered 2014-04-11: 10 mg via ORAL
  Filled 2014-04-11: qty 1

## 2014-04-11 NOTE — Discharge Instructions (Signed)

## 2014-04-11 NOTE — ED Provider Notes (Signed)
CSN: [id number]     Arrival date & time 04/11/14  1747 History   First MD Initiated Contact with Patient 04/11/14 1900     Chief Complaint  Patient presents with  . Dizziness     (Consider location/radiation/quality/duration/timing/severity/associated sxs/prior Treatment) Patient is a 73 y.o. male presenting with dizziness.  Dizziness Quality:  Imbalance Severity:  Moderate Onset quality:  Gradual Duration:  1 day Timing:  Constant Progression:  Resolved Chronicity:  New Context comment:  ? bit by insect L scapula Relieved by:  Nothing Worsened by:  Nothing tried Ineffective treatments:  None tried Associated symptoms: no blood in stool, no diarrhea, no nausea, no tinnitus, no vision changes, no vomiting and no weakness     Past Medical History  Diagnosis Date  . Hyperlipidemia Since 1995    takes Simvastatin daily  . Herpes   . Hypertension Since 1995    takes Metoprolol and Ramipril daily  . Sleep apnea     doesn't use CPAP  . Pneumonia     hx of;as a child  . Arthritis     fingers  . Chronic back pain   . GERD (gastroesophageal reflux disease)     takes Dexilant and Omeprazole daily  . History of gastric ulcer 25+yrs ago  . History of colon polyps   . Urinary urgency     takes Flomax daily  . Urinary frequency   . Diabetes mellitus Since 1995    Takes Glucovance and Onglyza daily  . Diabetes mellitus without complication   . Hypertension    Past Surgical History  Procedure Laterality Date  . Cervical spine surgery    . Rotator cuff repair      Right  . Wrist surgery      Left  . Hand surgery      Right  . Penile prosthesis implant    . Back surgery  1962    Lumbar  . Bilateral cataract surgery    . Cardiac catheterization  02/16/10  . Colonoscopy    . Esophagogastroduodenoscopy (egd) with esophageal dilation    . Lumbar laminectomy/decompression microdiscectomy N/A 08/29/2012    Procedure: LUMBAR LAMINECTOMY/DECOMPRESSION MICRODISCECTOMY 1 LEVEL;   Surgeon: Floyce Stakes, MD;  Location: [facility];  Service: Neurosurgery;  Laterality: N/A;  Lumbar four-five laminectomies  . Colonoscopy N/A 04/23/2013    Procedure: COLONOSCOPY;  Surgeon: Rogene Houston, MD;  Location: [facility];  Service: Endoscopy;  Laterality: N/A;  1030  . Back surgery    . Neck surgery    . Shoulder surgery    . Wrist surgery     Family History  Problem Relation Age of Onset  . Heart attack Father 48    MI  . Hypertension Father   . Heart attack Brother 30    MI  . Heart attack Paternal Uncle   . Diabetes Mother    History  Substance Use Topics  . Smoking status: Never Smoker   . Smokeless tobacco: Never Used  . Alcohol Use: Yes     Comment: occasionally beer    Review of Systems  HENT: Negative for tinnitus.   Gastrointestinal: Negative for nausea, vomiting, diarrhea and blood in stool.  Neurological: Positive for dizziness.  All other systems reviewed and are negative.     Allergies  Aspirin and Bayer aspirin  Home Medications   Prior to Admission medications   Medication Sig Start Date End Date Taking? Authorizing Provider  aspirin 81 MG EC tablet  Take 81 mg by mouth daily.     Yes Historical Provider, MD  glyBURIDE-metformin (GLUCOVANCE) 5-500 MG per tablet Take 2 tablets by mouth 2 (two) times daily with a meal.    Yes Historical Provider, MD  LANTUS SOLOSTAR 100 UNIT/ML SOPN Inject 12 Units into the skin at bedtime. 04/15/13  Yes Historical Provider, MD  metoprolol (TOPROL-XL) 50 MG 24 hr tablet Take 50 mg by mouth daily.     Yes Historical Provider, MD  naproxen (NAPROSYN) 500 MG tablet Take 500 mg by mouth 2 (two) times daily with a meal.   Yes Historical Provider, MD  omeprazole (PRILOSEC) 40 MG capsule Take 40 mg by mouth daily.   Yes Historical Provider, MD  oxyCODONE (OXY IR/ROXICODONE) 5 MG immediate release tablet Take 5 mg by mouth 3 (three) times daily as needed for moderate pain. For pain 06/16/13  Yes Historical  Provider, MD  ramipril (ALTACE) 10 MG capsule Take 10 mg by mouth daily.  02/05/14  Yes Historical Provider, MD  simvastatin (ZOCOR) 20 MG tablet Take 20 mg by mouth daily. 04/15/13  Yes Historical Provider, MD  tamsulosin (FLOMAX) 0.4 MG CAPS capsule Take 0.4 mg by mouth daily.   Yes Historical Provider, MD  acyclovir (ZOVIRAX) 200 MG capsule Take 200 mg by mouth as needed.  01/07/14   Historical Provider, MD   BP 145/95  Pulse 62  Temp(Src) 97.7 F (36.5 C) (Oral)  Resp 13  Ht 5\' 10"  (1.778 m)  Wt 160 lb (72.576 kg)  BMI 22.96 kg/m2  SpO2 99% Physical Exam  Vitals reviewed. Constitutional: He is oriented to person, place, and time. He appears well-developed and well-nourished.  HENT:  Head: Normocephalic and atraumatic.  Eyes: Conjunctivae and EOM are normal.  Neck: Normal range of motion. Neck supple.  Cardiovascular: Normal rate, regular rhythm and normal heart sounds.   Pulmonary/Chest: Effort normal and breath sounds normal. No respiratory distress.  Abdominal: He exhibits no distension. There is no tenderness. There is no rebound and no guarding.  Musculoskeletal: Normal range of motion.  Neurological: He is alert and oriented to person, place, and time. He has normal strength and normal reflexes. No cranial nerve deficit or sensory deficit. Gait normal. GCS eye subscore is 4. GCS verbal subscore is 5. GCS motor subscore is 6.  Skin: Skin is warm and dry.    ED Course  Procedures (including critical care time) Labs Review Labs Reviewed  CBC WITH DIFFERENTIAL - Abnormal; Notable for the following:    RBC 3.59 (*)    Hemoglobin 11.6 (*)    HCT 32.7 (*)    All other components within normal limits  COMPREHENSIVE METABOLIC PANEL - Abnormal; Notable for the following:    GFR calc non Af Amer 80 (*)    All other components within normal limits  URINALYSIS, ROUTINE W REFLEX MICROSCOPIC - Abnormal; Notable for the following:    Hgb urine dipstick TRACE (*)    All other  components within normal limits  TROPONIN I  URINE MICROSCOPIC-ADD ON    Imaging Review Dg Chest 2 View  04/11/2014   CLINICAL DATA:  Weakness and dizziness since an insect bite this morning.  EXAM: CHEST  2 VIEW  COMPARISON:  PA and lateral chest 12/27/2013.  FINDINGS: Heart size and mediastinal contours are within normal limits. Both lungs are clear. Visualized skeletal structures are unremarkable.  IMPRESSION: Negative exam.   Electronically Signed   By: Inge Rise [name]   On: 04/11/2014  20:35   Ct Head Wo Contrast  04/11/2014   CLINICAL DATA:  Dizziness and headache beginning this morning. Hypertension.  EXAM: CT HEAD WITHOUT CONTRAST  TECHNIQUE: Contiguous axial images were obtained from the base of the skull through the vertex without intravenous contrast.  COMPARISON:  Head CT scan 02/05/2014 P  FINDINGS: There is no evidence of acute intracranial abnormality including infarct, hemorrhage, mass lesion, mass effect, midline shift or abnormal extra-axial fluid collection. No hydrocephalus pneumocephalus. The calvarium is intact. Imaged paranasal sinuses and mastoid air cells are clear.  IMPRESSION: Negative exam.   Electronically Signed   By: Inge Rise [name]   On: 04/11/2014 20:44     EKG Interpretation   Date/Time:  Sunday April 11 2014 19:33:51 EDT Ventricular Rate:  61 PR Interval:  160 QRS Duration: 76 QT Interval:  366 QTC Calculation: 369 R Axis:   12 Text Interpretation:  Sinus rhythm Atrial premature complexes No  significant change was found Confirmed by Debby Freiberg [phone]) on  04/11/2014 7:46:16 PM      MDM   Final diagnoses:  Acute nonintractable headache, unspecified headache type    73 y.o. male with pertinent PMH of DM, HTN, HLD presents with dizziness, described as imbalance shortly after thinking he was stung by something on his back.  No insect visualized, but abrupt cutaneous L scapular pain experienced.  Symptoms resolved with pain, however  pt continued to have a ha.  This resolved with reglan and benadryl.  Labs and imaging obtained as above and unremarkable.  Likely migraine, doubt anaphylaxis or other emergent pathology related to sting.  DC home in stable condition.    1. Acute nonintractable headache, unspecified headache type         Debby Freiberg, MD 04/12/14 1547

## 2014-04-11 NOTE — ED Notes (Signed)
Pt is here for evaluation after being "bit by something this am".  I checked his back and could not see anything.

## 2014-04-11 NOTE — ED Notes (Signed)
New note to say that pt came here due to dizziness that he has had today ever since he was stung by insect this am.  Pt reports dizziness when standing and also HA.  No focal weakness, will upgrade acuity due to dizziness

## 2014-05-14 ENCOUNTER — Encounter (HOSPITAL_COMMUNITY): Payer: Self-pay

## 2014-05-14 ENCOUNTER — Emergency Department (HOSPITAL_COMMUNITY)
Admission: EM | Admit: 2014-05-14 | Discharge: 2014-05-15 | Disposition: A | Discharge status: Home / Self Care | Payer: Medicare HMO | Attending: Emergency Medicine | Admitting: Emergency Medicine

## 2014-05-14 DIAGNOSIS — Z8619 Personal history of other infectious and parasitic diseases: Secondary | ICD-10-CM | POA: Diagnosis not present

## 2014-05-14 DIAGNOSIS — Z86010 Personal history of colonic polyps: Secondary | ICD-10-CM | POA: Insufficient documentation

## 2014-05-14 DIAGNOSIS — G8929 Other chronic pain: Secondary | ICD-10-CM | POA: Insufficient documentation

## 2014-05-14 DIAGNOSIS — M1990 Unspecified osteoarthritis, unspecified site: Secondary | ICD-10-CM | POA: Diagnosis not present

## 2014-05-14 DIAGNOSIS — Z8701 Personal history of pneumonia (recurrent): Secondary | ICD-10-CM | POA: Diagnosis not present

## 2014-05-14 DIAGNOSIS — E119 Type 2 diabetes mellitus without complications: Secondary | ICD-10-CM | POA: Insufficient documentation

## 2014-05-14 DIAGNOSIS — E785 Hyperlipidemia, unspecified: Secondary | ICD-10-CM | POA: Diagnosis not present

## 2014-05-14 DIAGNOSIS — Z79899 Other long term (current) drug therapy: Secondary | ICD-10-CM | POA: Insufficient documentation

## 2014-05-14 DIAGNOSIS — I10 Essential (primary) hypertension: Secondary | ICD-10-CM | POA: Diagnosis not present

## 2014-05-14 DIAGNOSIS — R112 Nausea with vomiting, unspecified: Secondary | ICD-10-CM | POA: Insufficient documentation

## 2014-05-14 DIAGNOSIS — Z7982 Long term (current) use of aspirin: Secondary | ICD-10-CM | POA: Insufficient documentation

## 2014-05-14 DIAGNOSIS — K219 Gastro-esophageal reflux disease without esophagitis: Secondary | ICD-10-CM | POA: Diagnosis not present

## 2014-05-14 DIAGNOSIS — R1013 Epigastric pain: Secondary | ICD-10-CM | POA: Diagnosis present

## 2014-05-14 NOTE — ED Notes (Signed)
Patient states nausea/vomiting that started 66min PTA. Patient states emesis X3 and abd pain. Patient denies diarrhea.

## 2014-05-15 LAB — COMPREHENSIVE METABOLIC PANEL
ALT: 14 U/L (ref 0–53)
AST: 18 U/L (ref 0–37)
Albumin: 4 g/dL (ref 3.5–5.2)
Alkaline Phosphatase: 47 U/L (ref 39–117)
Anion gap: 10 (ref 5–15)
BUN: 13 mg/dL (ref 6–23)
CALCIUM: 9.8 mg/dL (ref 8.4–10.5)
CO2: 31 mEq/L (ref 19–32)
Chloride: 101 mEq/L (ref 96–112)
Creatinine, Ser: 1.11 mg/dL (ref 0.50–1.35)
GFR calc non Af Amer: 64 mL/min — ABNORMAL LOW (ref >90)
GFR, EST AFRICAN AMERICAN: 74 mL/min — AB (ref >90)
Glucose, Bld: 122 mg/dL — ABNORMAL HIGH (ref 70–99)
Potassium: 4.2 mEq/L (ref 3.7–5.3)
SODIUM: 142 meq/L (ref 137–147)
TOTAL PROTEIN: 8.1 g/dL (ref 6.0–8.3)
Total Bilirubin: 0.3 mg/dL (ref 0.3–1.2)

## 2014-05-15 LAB — CBC WITH DIFFERENTIAL/PLATELET
BASOS ABS: 0 10*3/uL (ref 0.0–0.1)
BASOS PCT: 0 % (ref 0–1)
EOS ABS: 0.1 10*3/uL (ref 0.0–0.7)
EOS PCT: 1 % (ref 0–5)
HCT: 37.2 % — ABNORMAL LOW (ref 39.0–52.0)
Hemoglobin: 12.8 g/dL — ABNORMAL LOW (ref 13.0–17.0)
Lymphocytes Relative: 41 % (ref 12–46)
Lymphs Abs: 2.3 10*3/uL (ref 0.7–4.0)
MCH: 31.7 pg (ref 26.0–34.0)
MCHC: 34.4 g/dL (ref 30.0–36.0)
MCV: 92.1 fL (ref 78.0–100.0)
Monocytes Absolute: 0.2 10*3/uL (ref 0.1–1.0)
Monocytes Relative: 4 % (ref 3–12)
Neutro Abs: 3 10*3/uL (ref 1.7–7.7)
Neutrophils Relative %: 54 % (ref 43–77)
PLATELETS: 214 10*3/uL (ref 150–400)
RBC: 4.04 MIL/uL — ABNORMAL LOW (ref 4.22–5.81)
RDW: 12.9 % (ref 11.5–15.5)
WBC: 5.7 10*3/uL (ref 4.0–10.5)

## 2014-05-15 LAB — URINE MICROSCOPIC-ADD ON

## 2014-05-15 LAB — URINALYSIS, ROUTINE W REFLEX MICROSCOPIC
Bilirubin Urine: NEGATIVE
Glucose, UA: NEGATIVE mg/dL
Ketones, ur: NEGATIVE mg/dL
Leukocytes, UA: NEGATIVE
Nitrite: NEGATIVE
Protein, ur: 30 mg/dL — AB
SPECIFIC GRAVITY, URINE: 1.02 (ref 1.005–1.030)
UROBILINOGEN UA: 0.2 mg/dL (ref 0.0–1.0)
pH: 5.5 (ref 5.0–8.0)

## 2014-05-15 LAB — I-STAT CG4 LACTIC ACID, ED: Lactic Acid, Venous: 1.91 mmol/L (ref 0.5–2.2)

## 2014-05-15 LAB — TROPONIN I

## 2014-05-15 MED ORDER — SODIUM CHLORIDE 0.9 % IV SOLN
1000.0000 mL | Freq: Once | INTRAVENOUS | Status: AC
  Administered 2014-05-15: 1000 mL via INTRAVENOUS

## 2014-05-15 MED ORDER — ACETAMINOPHEN 325 MG PO TABS
650.0000 mg | ORAL_TABLET | Freq: Once | ORAL | Status: AC
  Administered 2014-05-15: 650 mg via ORAL
  Filled 2014-05-15: qty 2

## 2014-05-15 MED ORDER — ONDANSETRON 4 MG PO TBDP: ORAL_TABLET | ORAL | Status: DC

## 2014-05-15 MED ORDER — ONDANSETRON HCL 4 MG/2ML IJ SOLN
4.0000 mg | Freq: Once | INTRAMUSCULAR | Status: AC
  Administered 2014-05-15: 4 mg via INTRAVENOUS
  Filled 2014-05-15: qty 2

## 2014-05-15 NOTE — ED Provider Notes (Signed)
CSN: [id number]     Arrival date & time 05/14/14  2328 History  This chart was scribed for Wynetta Fines, MD by Tula Nakayama, ED Scribe. This patient was seen in room APA16A/[facility]16A and the patient's care was started at 12:40 AM.    Chief Complaint  Patient presents with  . GI Problem   The history is provided by the patient. No language interpreter was used.    HPI Comments: [name] is a 73 y.o. male who presents to the Emergency Department complaining of intermittent, epigastric pain, nausea, and vomiting that started 1 hour ago. Pt states 3 episodes of vomiting and nausea as associated symptoms. Pain was severe earlier but now subsided. Pt also notes 1 episode of diarrhea that occurred yesterday. Pt was given Zofran in the ED with relief.  Past Medical History  Diagnosis Date  . Hyperlipidemia Since 1995    takes Simvastatin daily  . Herpes   . Hypertension Since 1995    takes Metoprolol and Ramipril daily  . Sleep apnea     doesn't use CPAP  . Pneumonia     hx of;as a child  . Arthritis     fingers  . Chronic back pain   . GERD (gastroesophageal reflux disease)     takes Dexilant and Omeprazole daily  . History of gastric ulcer 25+yrs ago  . History of colon polyps   . Urinary urgency     takes Flomax daily  . Urinary frequency   . Diabetes mellitus Since 1995    Takes Glucovance and Onglyza daily  . Diabetes mellitus without complication   . Hypertension    Past Surgical History  Procedure Laterality Date  . Cervical spine surgery    . Rotator cuff repair      Right  . Wrist surgery      Left  . Hand surgery      Right  . Penile prosthesis implant    . Back surgery  1962    Lumbar  . Bilateral cataract surgery    . Cardiac catheterization  02/16/10  . Colonoscopy    . Esophagogastroduodenoscopy (egd) with esophageal dilation    . Lumbar laminectomy/decompression microdiscectomy N/A 08/29/2012    Procedure: LUMBAR LAMINECTOMY/DECOMPRESSION  MICRODISCECTOMY 1 LEVEL;  Surgeon: Floyce Stakes, MD;  Location: [facility];  Service: Neurosurgery;  Laterality: N/A;  Lumbar four-five laminectomies  . Colonoscopy N/A 04/23/2013    Procedure: COLONOSCOPY;  Surgeon: Rogene Houston, MD;  Location: [facility];  Service: Endoscopy;  Laterality: N/A;  1030  . Back surgery    . Neck surgery    . Shoulder surgery    . Wrist surgery     Family History  Problem Relation Age of Onset  . Heart attack Father 48    MI  . Hypertension Father   . Heart attack Brother 79    MI  . Heart attack Paternal Uncle   . Diabetes Mother    History  Substance Use Topics  . Smoking status: Never Smoker   . Smokeless tobacco: Never Used  . Alcohol Use: Yes     Comment: occasionally beer    Review of Systems  10 Systems reviewed and all are negative for acute change except as noted in the HPI.  Allergies  Aspirin and Bayer aspirin  Home Medications   Prior to Admission medications   Medication Sig Start Date End Date Taking? Authorizing Provider  acyclovir (ZOVIRAX) 200 MG capsule Take  200 mg by mouth as needed.  01/07/14   Historical Provider, MD  aspirin 81 MG EC tablet Take 81 mg by mouth daily.      Historical Provider, MD  glyBURIDE-metformin (GLUCOVANCE) 5-500 MG per tablet Take 2 tablets by mouth 2 (two) times daily with a meal.     Historical Provider, MD  LANTUS SOLOSTAR 100 UNIT/ML SOPN Inject 12 Units into the skin at bedtime. 04/15/13   Historical Provider, MD  metoprolol (TOPROL-XL) 50 MG 24 hr tablet Take 50 mg by mouth daily.      Historical Provider, MD  naproxen (NAPROSYN) 500 MG tablet Take 500 mg by mouth 2 (two) times daily with a meal.    Historical Provider, MD  omeprazole (PRILOSEC) 40 MG capsule Take 40 mg by mouth daily.    Historical Provider, MD  oxyCODONE (OXY IR/ROXICODONE) 5 MG immediate release tablet Take 5 mg by mouth 3 (three) times daily as needed for moderate pain. For pain 06/16/13   Historical Provider, MD   ramipril (ALTACE) 10 MG capsule Take 10 mg by mouth daily.  02/05/14   Historical Provider, MD  simvastatin (ZOCOR) 20 MG tablet Take 20 mg by mouth daily. 04/15/13   Historical Provider, MD  tamsulosin (FLOMAX) 0.4 MG CAPS capsule Take 0.4 mg by mouth daily.    Historical Provider, MD   BP 181/72 mmHg  Pulse 70  Temp(Src) 98.6 F (37 C) (Oral)  Resp 20  Ht 5\' 10"  (1.778 m)  Wt 160 lb (72.576 kg)  BMI 22.96 kg/m2  SpO2 100% Physical Exam  Nursing note and vitals reviewed.   General: Well-developed, well-nourished male in no acute distress; appearance consistent with age of record HENT: normocephalic; atraumatic Eyes: pupils equal, round and reactive to light; extraocular muscles intact Neck: supple Heart: regular rate and rhythm; no murmurs, rubs or gallops Lungs: clear to auscultation bilaterally Abdomen: soft; nondistended; suprapubic tenderness; no masses or hepatosplenomegaly; bowel sounds present Extremities: No deformity; full range of motion; pulses normal Neurologic: Awake, alert and oriented; motor function intact in all extremities and symmetric; no facial droop Skin: Warm and dry Psychiatric: Normal mood and affect   ED Course  Procedures (including critical care time)  DIAGNOSTIC STUDIES: Oxygen Saturation is 100% on RA, normal by my interpretation.    COORDINATION OF CARE: 12:46 AM Discussed treatment plan with pt at bedside and pt agreed to plan.  MDM   Results for orders placed or performed during the hospital encounter of 04/11/14  CBC with Differential  Result Value Ref Range   WBC 4.2 4.0 - 10.5 K/uL   RBC 3.59 (L) 4.22 - 5.81 MIL/uL   Hemoglobin 11.6 (L) 13.0 - 17.0 g/dL   HCT 32.7 (L) 39.0 - 52.0 %   MCV 91.1 78.0 - 100.0 fL   MCH 32.3 26.0 - 34.0 pg   MCHC 35.5 30.0 - 36.0 g/dL   RDW 12.8 11.5 - 15.5 %   Platelets 163 150 - 400 K/uL   Neutrophils Relative % 52 43 - 77 %   Neutro Abs 2.2 1.7 - 7.7 K/uL   Lymphocytes Relative 38 12 - 46 %    Lymphs Abs 1.6 0.7 - 4.0 K/uL   Monocytes Relative 8 3 - 12 %   Monocytes Absolute 0.3 0.1 - 1.0 K/uL   Eosinophils Relative 1 0 - 5 %   Eosinophils Absolute 0.1 0.0 - 0.7 K/uL   Basophils Relative 1 0 - 1 %   Basophils Absolute 0.0  0.0 - 0.1 K/uL  Comprehensive metabolic panel  Result Value Ref Range   Sodium 142 137 - 147 mEq/L   Potassium 4.0 3.7 - 5.3 mEq/L   Chloride 105 96 - 112 mEq/L   CO2 28 19 - 32 mEq/L   Glucose, Bld 79 70 - 99 mg/dL   BUN 17 6 - 23 mg/dL   Creatinine, Ser 0.99 0.50 - 1.35 mg/dL   Calcium 9.5 8.4 - 10.5 mg/dL   Total Protein 7.1 6.0 - 8.3 g/dL   Albumin 3.7 3.5 - 5.2 g/dL   AST 17 0 - 37 U/L   ALT 18 0 - 53 U/L   Alkaline Phosphatase 40 39 - 117 U/L   Total Bilirubin 0.3 0.3 - 1.2 mg/dL   GFR calc non Af Amer 80 (L) >90 mL/min   GFR calc Af Amer >90 >90 mL/min   Anion gap 9 5 - 15  Troponin I  Result Value Ref Range   Troponin I <0.30 <0.30 ng/mL  Urinalysis, Routine w reflex microscopic  Result Value Ref Range   Color, Urine YELLOW YELLOW   APPearance CLEAR CLEAR   Specific Gravity, Urine 1.015 1.005 - 1.030   pH 6.0 5.0 - 8.0   Glucose, UA NEGATIVE NEGATIVE mg/dL   Hgb urine dipstick TRACE (A) NEGATIVE   Bilirubin Urine NEGATIVE NEGATIVE   Ketones, ur NEGATIVE NEGATIVE mg/dL   Protein, ur NEGATIVE NEGATIVE mg/dL   Urobilinogen, UA 0.2 0.0 - 1.0 mg/dL   Nitrite NEGATIVE NEGATIVE   Leukocytes, UA NEGATIVE NEGATIVE  Urine microscopic-add on  Result Value Ref Range   RBC / HPF 0-2 <3 RBC/hpf   2:32 AM Drinking fluids without emesis. Given Tylenol for headache.  I personally performed the services described in this documentation, which was scribed in my presence. The recorded information has been reviewed and is accurate.   Wynetta Fines, MD 05/15/14 [phone]

## 2014-05-15 NOTE — ED Notes (Signed)
Pt given sprite zero and c/o a small frontal headache.

## 2014-05-15 NOTE — Discharge Instructions (Signed)

## 2014-05-17 MED FILL — Ondansetron HCl Tab 4 MG: ORAL | Qty: 4 | Status: AC

## 2014-09-15 ENCOUNTER — Encounter (INDEPENDENT_AMBULATORY_CARE_PROVIDER_SITE_OTHER): Payer: Self-pay | Admitting: Internal Medicine

## 2014-09-15 ENCOUNTER — Ambulatory Visit (INDEPENDENT_AMBULATORY_CARE_PROVIDER_SITE_OTHER): Payer: Medicare HMO | Admitting: Internal Medicine

## 2014-09-15 VITALS — BP 140/60 | HR 64 | Temp 97.8°F | Ht 70.0 in | Wt 156.8 lb

## 2014-09-15 DIAGNOSIS — K838 Other specified diseases of biliary tract: Secondary | ICD-10-CM

## 2014-09-15 LAB — CBC WITH DIFFERENTIAL/PLATELET
Basophils Absolute: 0 10*3/uL (ref 0.0–0.1)
Basophils Relative: 1 % (ref 0–1)
EOS ABS: 0.1 10*3/uL (ref 0.0–0.7)
EOS PCT: 2 % (ref 0–5)
HCT: 37.6 % — ABNORMAL LOW (ref 39.0–52.0)
Hemoglobin: 12.5 g/dL — ABNORMAL LOW (ref 13.0–17.0)
LYMPHS PCT: 55 % — AB (ref 12–46)
Lymphs Abs: 1.8 10*3/uL (ref 0.7–4.0)
MCH: 30.7 pg (ref 26.0–34.0)
MCHC: 33.2 g/dL (ref 30.0–36.0)
MCV: 92.4 fL (ref 78.0–100.0)
MPV: 9.8 fL (ref 8.6–12.4)
Monocytes Absolute: 0.2 10*3/uL (ref 0.1–1.0)
Monocytes Relative: 7 % (ref 3–12)
Neutro Abs: 1.2 10*3/uL — ABNORMAL LOW (ref 1.7–7.7)
Neutrophils Relative %: 35 % — ABNORMAL LOW (ref 43–77)
PLATELETS: 203 10*3/uL (ref 150–400)
RBC: 4.07 MIL/uL — AB (ref 4.22–5.81)
RDW: 13.9 % (ref 11.5–15.5)
WBC: 3.3 10*3/uL — AB (ref 4.0–10.5)

## 2014-09-15 NOTE — Patient Instructions (Signed)
CBC and Hepatic function. OV 6 months

## 2014-09-15 NOTE — Progress Notes (Signed)
Subjective:    Patient ID: [name], male    DOB: 04-16-1941, 74 y.o.   MRN: [id number]  HPI Presents today with c/o a "green slimy stool". Occurred x 2. Once today and once last week.He had a sharp pain in his lower abdomen which lasted about a second and then resolved. He feels fine now. No BM since this AM. He has not been on any recent antibiotics. Appetite is good . There has been no weight loss. Feels 100% better. 02/12/2014 US abdomen: Dilated CBD IMPRESSION: Prominent common bile duct increased in size from the previous examination at which time it measured 7 mm. Correlation with laboratory values is recommended. Stable gallbladder polyp.   CMP     Component Value Date/Time   NA 142 05/15/2014 0016   K 4.2 05/15/2014 0016   CL 101 05/15/2014 0016   CO2 31 05/15/2014 0016   GLUCOSE 122* 05/15/2014 0016   BUN 13 05/15/2014 0016   CREATININE 1.11 05/15/2014 0016   CREATININE 0.95 01/21/2013 1057   CALCIUM 9.8 05/15/2014 0016   PROT 8.1 05/15/2014 0016   ALBUMIN 4.0 05/15/2014 0016   AST 18 05/15/2014 0016   ALT 14 05/15/2014 0016   ALKPHOS 47 05/15/2014 0016   BILITOT 0.3 05/15/2014 0016   GFRNONAA 64* 05/15/2014 0016   GFRAA 74* 05/15/2014 0016       04/23/2013 Colonoscopy: Hx of colonic adenoma:  Prep excellent. Three small polyps ablated via cold biopsy and submitted together. One was located at ascending colon and others 2 at hepatic flexure. Normal rectal mucosa. Small hemorrhoids above and below the dentate line.  Biopsy results reviewed with the patient's son [name]. 3 small polyps removed and these are tubular adenomas. Patient has had adenomas in the past as well Reports to PCP Next colonoscopy in 5 years Review of Systems Past Medical History  Diagnosis Date  . Hyperlipidemia Since 1995    takes Simvastatin daily  . Herpes   . Hypertension Since 1995    takes Metoprolol and Ramipril daily  . Sleep apnea     doesn't use CPAP  .  Pneumonia     hx of;as a child  . Arthritis     fingers  . Chronic back pain   . GERD (gastroesophageal reflux disease)     takes Dexilant and Omeprazole daily  . History of gastric ulcer 25+yrs ago  . History of colon polyps   . Urinary urgency     takes Flomax daily  . Urinary frequency   . Diabetes mellitus Since 1995    Takes Glucovance and Onglyza daily  . Diabetes mellitus without complication   . Hypertension     Past Surgical History  Procedure Laterality Date  . Cervical spine surgery    . Rotator cuff repair      Right  . Wrist surgery      Left  . Hand surgery      Right  . Penile prosthesis implant    . Back surgery  1962    Lumbar  . Bilateral cataract surgery    . Cardiac catheterization  02/16/10  . Colonoscopy    . Esophagogastroduodenoscopy (egd) with esophageal dilation    . Lumbar laminectomy/decompression microdiscectomy N/A 08/29/2012    Procedure: LUMBAR LAMINECTOMY/DECOMPRESSION MICRODISCECTOMY 1 LEVEL;  Surgeon: Floyce Stakes, MD;  Location: [place] [facility];  Service: Neurosurgery;  Laterality: N/A;  Lumbar four-five laminectomies  . Colonoscopy N/A 04/23/2013    Procedure: COLONOSCOPY;  Surgeon:  Rogene Houston, MD;  Location: [facility];  Service: Endoscopy;  Laterality: N/A;  1030  . Back surgery    . Neck surgery    . Shoulder surgery    . Wrist surgery      Allergies  Allergen Reactions  . Aspirin Other (See Comments)    Higher dosage upsets stomach  . Bayer Aspirin [Aspirin]     Stomach "flares up"    Current Outpatient Prescriptions on File Prior to Visit  Medication Sig Dispense Refill  . acyclovir (ZOVIRAX) 200 MG capsule Take 200 mg by mouth as needed.     Marland Kitchen aspirin 81 MG EC tablet Take 81 mg by mouth daily.      Marland Kitchen glyBURIDE-metformin (GLUCOVANCE) 5-500 MG per tablet Take 2 tablets by mouth 2 (two) times daily with a meal.     . LANTUS SOLOSTAR 100 UNIT/ML SOPN Inject 12 Units into the skin at bedtime.    . metoprolol  (TOPROL-XL) 50 MG 24 hr tablet Take 50 mg by mouth daily.      . naproxen (NAPROSYN) 500 MG tablet Take 500 mg by mouth 2 (two) times daily with a meal.    . omeprazole (PRILOSEC) 40 MG capsule Take 40 mg by mouth daily.    . ondansetron (ZOFRAN ODT) 4 MG disintegrating tablet 4mg  ODT q4 hours prn nausea/vomit 4 tablet 0  . oxyCODONE (OXY IR/ROXICODONE) 5 MG immediate release tablet Take 5 mg by mouth 3 (three) times daily as needed for moderate pain. For pain    . ramipril (ALTACE) 10 MG capsule Take 10 mg by mouth daily.     . simvastatin (ZOCOR) 20 MG tablet Take 20 mg by mouth daily.    . tamsulosin (FLOMAX) 0.4 MG CAPS capsule Take 0.4 mg by mouth daily.    . [DISCONTINUED] bismuth-metronidazole-tetracycline (PLYERA) 140-125-125 MG per capsule Take 3 capsules by mouth 4 (four) times daily -  before meals and at bedtime.     No current facility-administered medications on file prior to visit.        Objective:   Physical Exam Blood pressure 140/60, pulse 64, temperature 97.8 F (36.6 C), height 5\' 10"  (1.778 m), weight 156 lb 12.8 oz (71.124 kg). Alert and oriented. Skin warm and dry. Oral mucosa is moist.   . Sclera anicteric, conjunctivae is pink. Thyroid not enlarged. No cervical lymphadenopathy. Lungs clear. Heart regular rate and rhythm.  Abdomen is soft. Bowel sounds are positive. No hepatomegaly. No abdominal masses felt. No tenderness.  No edema to lower extremities.  Stool brown and guaiac negative.   Developer [phone], 12/2014 Card Lot 02-14, Ex 7/18     Assessment & Plan:  Two episodes of green stool. Probably benign. Stool brown in office ( I also showed patient). Guaiac negative.  No symptoms at office visit. Hx of dilated CBD. Will get a CBC and Hepatic function today. OV as needed.  CBC and Hepatic function today.

## 2014-09-16 LAB — HEPATIC FUNCTION PANEL
ALT: 13 U/L (ref 0–53)
AST: 15 U/L (ref 0–37)
Albumin: 3.9 g/dL (ref 3.5–5.2)
Alkaline Phosphatase: 37 U/L — ABNORMAL LOW (ref 39–117)
BILIRUBIN INDIRECT: 0.3 mg/dL (ref 0.2–1.2)
BILIRUBIN TOTAL: 0.4 mg/dL (ref 0.2–1.2)
Bilirubin, Direct: 0.1 mg/dL (ref 0.0–0.3)
TOTAL PROTEIN: 7.1 g/dL (ref 6.0–8.3)

## 2014-09-17 IMAGING — US US ABDOMEN LIMITED
1 series · 14 of 25 positions shown · non-contrast
Comparison: 01/23/2013

CLINICAL DATA: History of gallbladder polyps and ductal dilatation

EXAM:
US ABDOMEN LIMITED - RIGHT UPPER QUADRANT

[Series 1: us abdomen limited · 0.14mm/px · 14 of 64 slices shown]
[im 1/64]
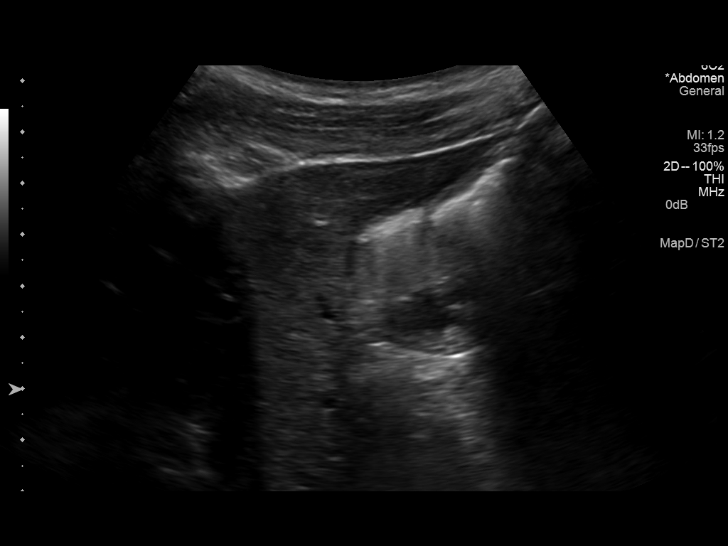
[im 6/64]
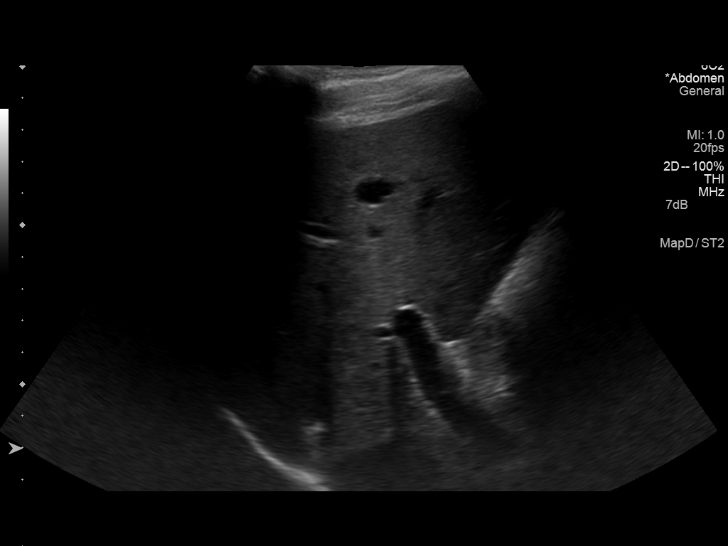
[im 11/64]
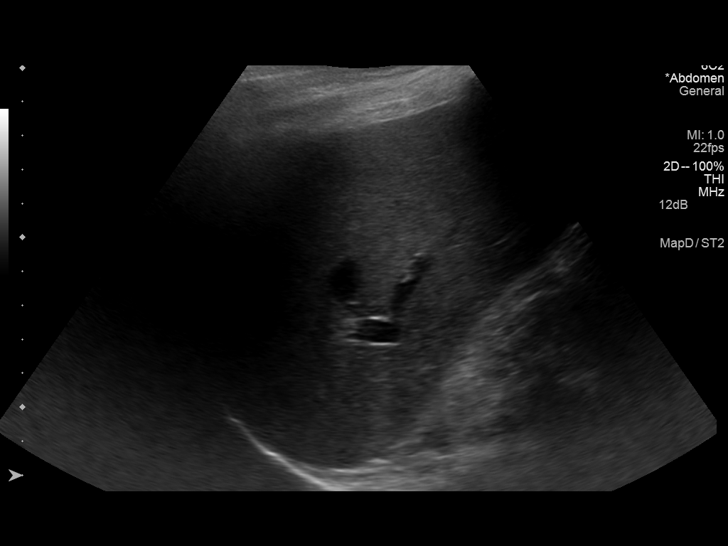
[im 16/64]
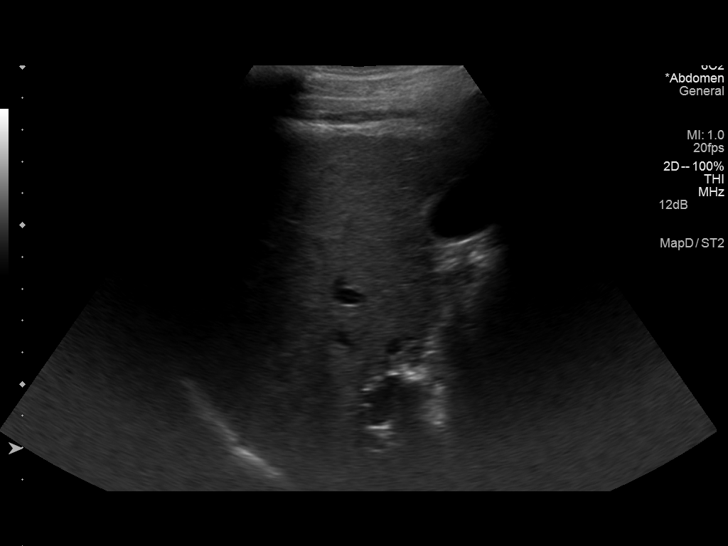
[im 22/64]
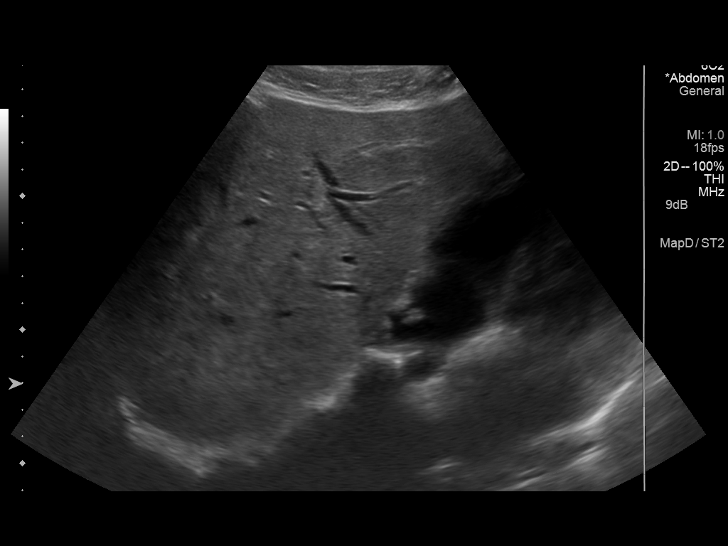
[im 24/64]
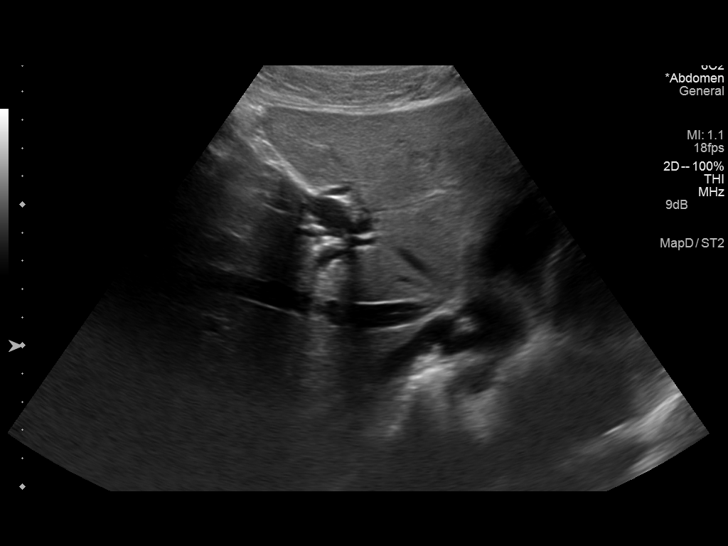
[im 29/64]
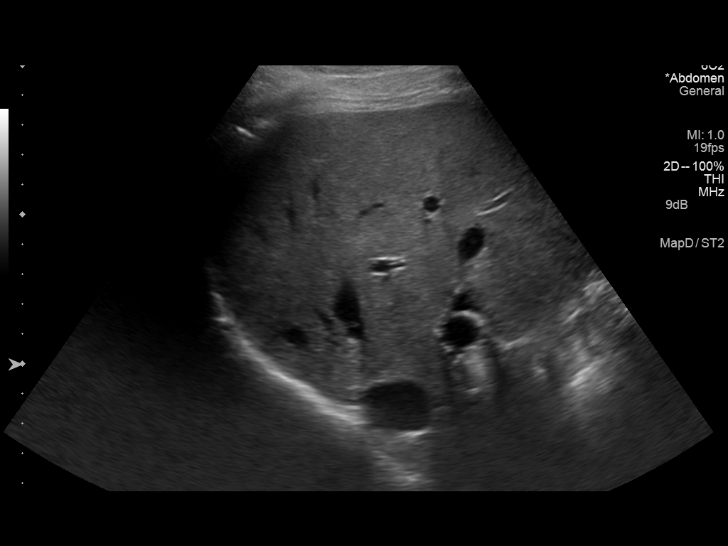
[im 35/64]
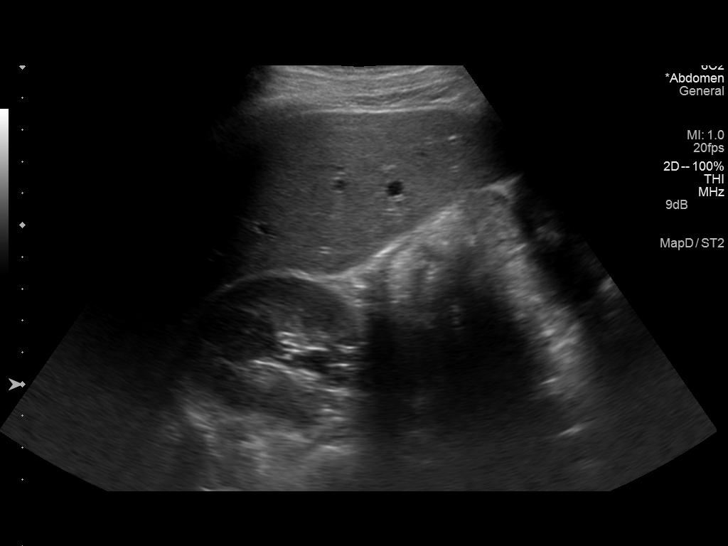
[im 40/64]
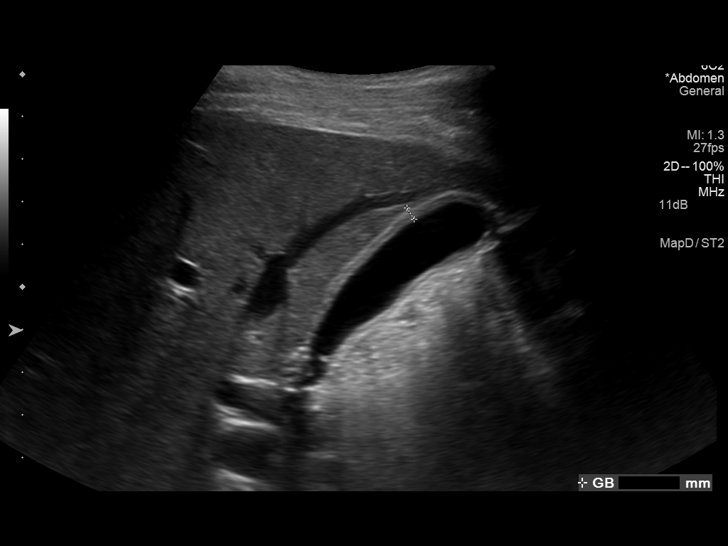
[im 43/64]
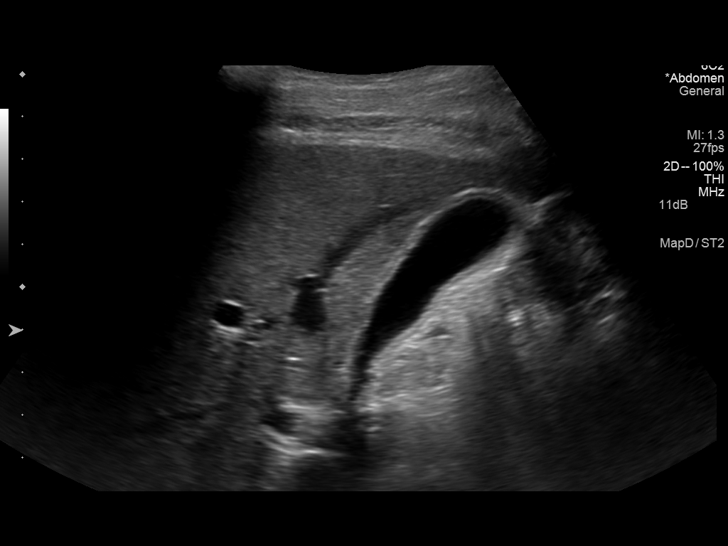
[im 48/64]
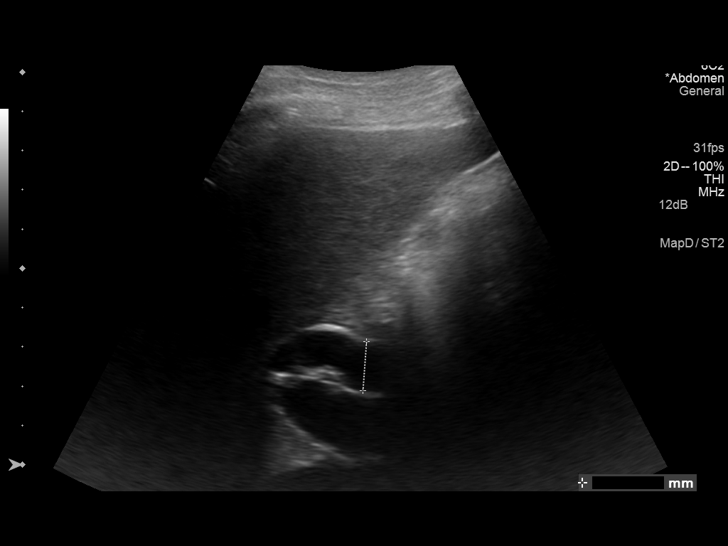
[im 53/64]
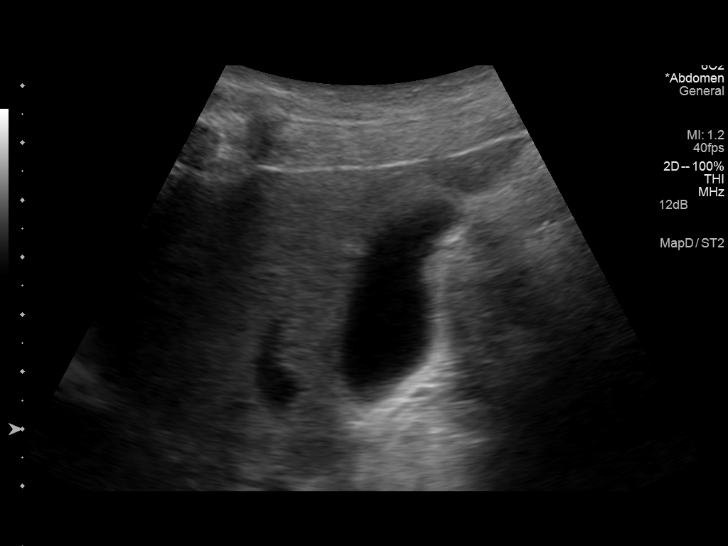
[im 58/64]
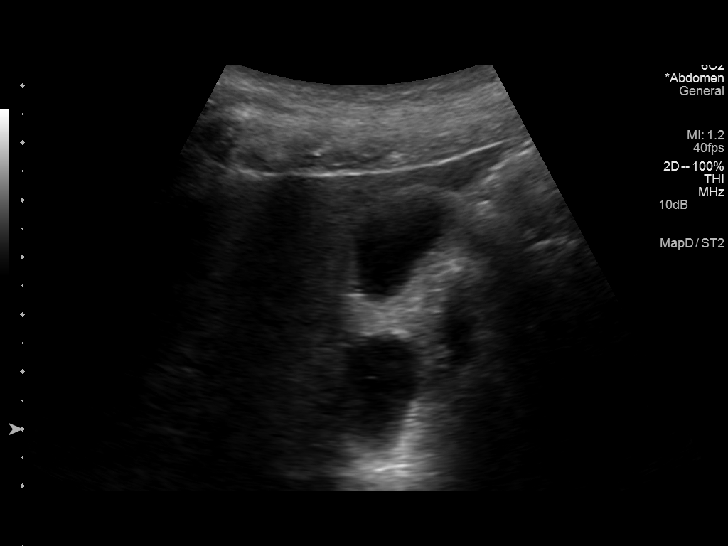
[im 64/64]
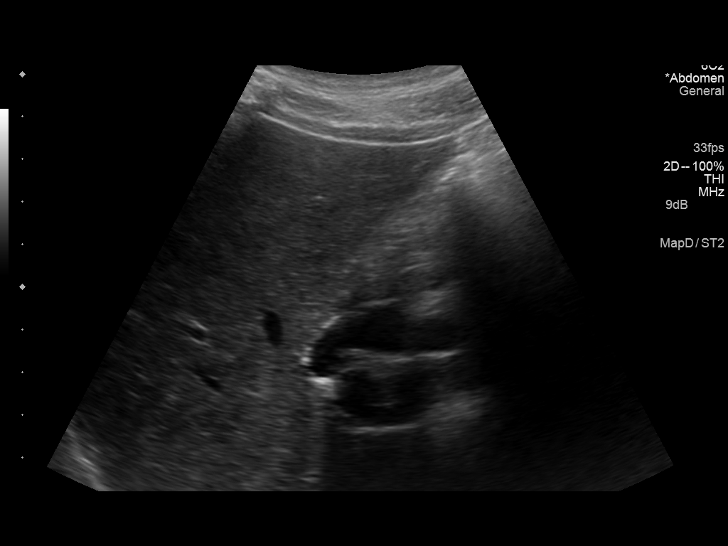

[14 of 25 positions shown; findings below may reference images not displayed]

FINDINGS: Gallbladder:

No gallstones are identified. A few small gallbladder polyps are
seen. No significant wall thickening is noted.

Common bile duct:

Diameter: 12.5 mm. This is not increase from the prior exam.
Correlation with liver function tests is recommended.

Liver:

No focal lesion identified. Within normal limits in parenchymal
echogenicity.
IMPRESSION: Prominent common bile duct increased in size from the previous
examination at which time it measured 7 mm. Correlation with
laboratory values is recommended.

Stable gallbladder polyp.

## 2014-12-22 ENCOUNTER — Other Ambulatory Visit (INDEPENDENT_AMBULATORY_CARE_PROVIDER_SITE_OTHER): Payer: Self-pay | Admitting: *Deleted

## 2014-12-22 ENCOUNTER — Ambulatory Visit (INDEPENDENT_AMBULATORY_CARE_PROVIDER_SITE_OTHER): Payer: Medicare HMO | Admitting: Internal Medicine

## 2014-12-22 ENCOUNTER — Encounter (INDEPENDENT_AMBULATORY_CARE_PROVIDER_SITE_OTHER): Payer: Self-pay | Admitting: Internal Medicine

## 2014-12-22 VITALS — BP 122/58 | HR 64 | Temp 97.5°F | Ht 70.0 in | Wt 151.6 lb

## 2014-12-22 DIAGNOSIS — R110 Nausea: Secondary | ICD-10-CM

## 2014-12-22 DIAGNOSIS — K920 Hematemesis: Secondary | ICD-10-CM

## 2014-12-22 LAB — CBC WITH DIFFERENTIAL/PLATELET
BASOS ABS: 0 10*3/uL (ref 0.0–0.1)
Basophils Relative: 0 % (ref 0–1)
Eosinophils Absolute: 0.1 10*3/uL (ref 0.0–0.7)
Eosinophils Relative: 2 % (ref 0–5)
HEMATOCRIT: 33.8 % — AB (ref 39.0–52.0)
Hemoglobin: 11.6 g/dL — ABNORMAL LOW (ref 13.0–17.0)
LYMPHS ABS: 1.9 10*3/uL (ref 0.7–4.0)
Lymphocytes Relative: 48 % — ABNORMAL HIGH (ref 12–46)
MCH: 31.1 pg (ref 26.0–34.0)
MCHC: 34.3 g/dL (ref 30.0–36.0)
MCV: 90.6 fL (ref 78.0–100.0)
MPV: 9.8 fL (ref 8.6–12.4)
Monocytes Absolute: 0.4 10*3/uL (ref 0.1–1.0)
Monocytes Relative: 9 % (ref 3–12)
NEUTROS PCT: 41 % — AB (ref 43–77)
Neutro Abs: 1.6 10*3/uL — ABNORMAL LOW (ref 1.7–7.7)
Platelets: 207 10*3/uL (ref 150–400)
RBC: 3.73 MIL/uL — AB (ref 4.22–5.81)
RDW: 13.7 % (ref 11.5–15.5)
WBC: 4 10*3/uL (ref 4.0–10.5)

## 2014-12-22 NOTE — Patient Instructions (Addendum)
EGD. The risks and benefits such as perforation, bleeding, and infection were reviewed with the patient and is agreeable. Stop the Naproxen CBC

## 2014-12-22 NOTE — Progress Notes (Signed)
Subjective:    Patient ID: [name], male    DOB: [birth year], 74 y.o.   MRN: [id number]    HPI Presents today with co that yesterday he vomited blood in the morning. He began to cough yesterday morning and became nauseated. He vomited x 3. He says when he vomited he saw blood in the vomitus.   He describes the blood as bright red. Small amt.  No symptoms since yesterday.  Appetite is good. No weight loss. No abdominal pain. He usually has a BM daily  No melena or BRRB. Stools are brown in color. Occasionally has acid reflux. No dysphagia.  Take Naproxen BID and ASA daily.      History of mildly dilated common bile. Incidentally he also has history of mildly dilated pancreatic duct.    Liver enzymes have been normal.  Last US Hepatic Function Latest Ref Rng 09/15/2014 05/15/2014 04/11/2014  Total Protein 6.0 - 8.3 g/dL 7.1 8.1 7.1  Albumin 3.5 - 5.2 g/dL 3.9 4.0 3.7  AST 0 - 37 U/L '15 18 17  ' ALT 0 - 53 U/L '13 14 18  ' Alk Phosphatase 39 - 117 U/L 37(L) 47 40  Total Bilirubin 0.2 - 1.2 mg/dL 0.4 0.3 0.3  Bilirubin, Direct 0.0 - 0.3 mg/dL 0.1 - -    02/2014 US abdomen: CBC 12.5,  MPRESSION: Prominent common bile duct increased in size from the previous examination at which time it measured 7 mm. Correlation with laboratory values is recommended.  Stable gallbladder polyp.   01/26/2005  EGD/ED:  Esophageal ring proximal to the GE junction which was intact following esophageal dilatation with 25 French Maloney and, therefore, disrupted focal biopsy. Antral erythema, possible related to Motrin.  Has been treated for H. Pylori in the past (2002).   04/23/2013 Colonoscopy  Indications: Patient is 74 year old African male with history of colonic adenomas who is here for surveillance colonoscopy. His last exam was 5 years ago. Impression:  Examination performed to cecum. Three small polyps ablated via cold biopsy and submitted together(one at ascending colon and two at hepatic  flexure). Internal/external hemorrhoids.  Biopsy results reviewed with the patient's son [name]. 3 small polyps removed and these are tubular adenomas. Patient has had adenomas in the past as well Reports to PCP Next colonoscopy in 5 years Review of Systems     Past Medical History  Diagnosis Date  . Hyperlipidemia Since 1995    takes Simvastatin daily  . Herpes   . Hypertension Since 1995    takes Metoprolol and Ramipril daily  . Sleep apnea     doesn't use CPAP  . Pneumonia     hx of;as a child  . Arthritis     fingers  . Chronic back pain   . GERD (gastroesophageal reflux disease)     takes Dexilant and Omeprazole daily  . History of gastric ulcer 25+yrs ago  . History of colon polyps   . Urinary urgency     takes Flomax daily  . Urinary frequency   . Diabetes mellitus Since 1995    Takes Glucovance and Onglyza daily  . Diabetes mellitus without complication   . Hypertension     Past Surgical History  Procedure Laterality Date  . Cervical spine surgery    . Rotator cuff repair      Right  . Wrist surgery      Left  . Hand surgery      Right  . Penile prosthesis implant    .  Back surgery  1962    Lumbar  . Bilateral cataract surgery    . Cardiac catheterization  02/16/10  . Colonoscopy    . Esophagogastroduodenoscopy (egd) with esophageal dilation    . Lumbar laminectomy/decompression microdiscectomy N/A 08/29/2012    Procedure: LUMBAR LAMINECTOMY/DECOMPRESSION MICRODISCECTOMY 1 LEVEL;  Surgeon: Floyce Stakes, MD;  Location: [place] [facility];  Service: Neurosurgery;  Laterality: N/A;  Lumbar four-five laminectomies  . Colonoscopy N/A 04/23/2013    Procedure: COLONOSCOPY;  Surgeon: Rogene Houston, MD;  Location: [facility];  Service: Endoscopy;  Laterality: N/A;  1030  . Back surgery    . Neck surgery    . Shoulder surgery    . Wrist surgery      Allergies  Allergen Reactions  . Aspirin Other (See Comments)    Higher dosage upsets stomach  . Bayer  Aspirin [Aspirin]     Stomach "flares up"    Current Outpatient Prescriptions on File Prior to Visit  Medication Sig Dispense Refill  . acyclovir (ZOVIRAX) 200 MG capsule Take 200 mg by mouth as needed.     Marland Kitchen aspirin 81 MG EC tablet Take 81 mg by mouth daily.      Marland Kitchen glyBURIDE-metformin (GLUCOVANCE) 5-500 MG per tablet Take 2 tablets by mouth 2 (two) times daily with a meal.     . LANTUS SOLOSTAR 100 UNIT/ML SOPN Inject 12 Units into the skin at bedtime.    . metoprolol (TOPROL-XL) 50 MG 24 hr tablet Take 50 mg by mouth daily.      . naproxen (NAPROSYN) 500 MG tablet Take 500 mg by mouth 2 (two) times daily with a meal.    . omeprazole (PRILOSEC) 40 MG capsule Take 40 mg by mouth daily.    Marland Kitchen oxyCODONE (OXY IR/ROXICODONE) 5 MG immediate release tablet Take 5 mg by mouth 3 (three) times daily as needed for moderate pain. For pain    . ramipril (ALTACE) 10 MG capsule Take 10 mg by mouth daily.     . simvastatin (ZOCOR) 20 MG tablet Take 20 mg by mouth daily.    . tamsulosin (FLOMAX) 0.4 MG CAPS capsule Take 0.4 mg by mouth daily.    . ondansetron (ZOFRAN ODT) 4 MG disintegrating tablet 57m ODT q4 hours prn nausea/vomit (Patient not taking: Reported on 12/22/2014) 4 tablet 0  . [DISCONTINUED] bismuth-metronidazole-tetracycline (PLYERA) 140-125-125 MG per capsule Take 3 capsules by mouth 4 (four) times daily -  before meals and at bedtime.     No current facility-administered medications on file prior to visit.     Objective:   Physical Exam Blood pressure 122/58, pulse 64, temperature 97.5 F (36.4 C), height '5\' 10"'  (1.778 m), weight 151 lb 9.6 oz (68.765 kg).  Alert and oriented. Skin warm and dry. Oral mucosa is moist.   . Sclera anicteric, conjunctivae is pink. Thyroid not enlarged. No cervical lymphadenopathy. Lungs clear. Heart regular rate and rhythm.  Abdomen is soft. Bowel sounds are positive. No hepatomegaly. No abdominal masses felt. No tenderness.  No edema to lower extremities.   Stool brown and guaiac negative.    Developer: [id number]( 05/2018) Card: Lot [id number](12/18)    Assessment & Plan:  Hematemesis: Possible mallory weiss tear. Will schedule an EGD with Dr. RLaural Golden  Stop the Naproxen.  I discussed with Dr. RLaural Golden CBC today

## 2014-12-24 ENCOUNTER — Ambulatory Visit (HOSPITAL_COMMUNITY)
Admission: RE | Admit: 2014-12-24 | Discharge: 2014-12-24 | Disposition: A | Discharge status: Home / Self Care | Payer: Medicare HMO | Source: Ambulatory Visit | Attending: Internal Medicine | Admitting: Internal Medicine

## 2014-12-24 ENCOUNTER — Encounter (HOSPITAL_COMMUNITY): Payer: Self-pay | Admitting: *Deleted

## 2014-12-24 ENCOUNTER — Encounter (HOSPITAL_COMMUNITY)
Admission: RE | Disposition: A | Discharge status: Home / Self Care | Payer: Self-pay | Source: Ambulatory Visit | Attending: Internal Medicine

## 2014-12-24 DIAGNOSIS — M13849 Other specified arthritis, unspecified hand: Secondary | ICD-10-CM | POA: Diagnosis not present

## 2014-12-24 DIAGNOSIS — K2950 Unspecified chronic gastritis without bleeding: Secondary | ICD-10-CM | POA: Insufficient documentation

## 2014-12-24 DIAGNOSIS — I10 Essential (primary) hypertension: Secondary | ICD-10-CM | POA: Insufficient documentation

## 2014-12-24 DIAGNOSIS — D649 Anemia, unspecified: Secondary | ICD-10-CM | POA: Insufficient documentation

## 2014-12-24 DIAGNOSIS — Z79891 Long term (current) use of opiate analgesic: Secondary | ICD-10-CM | POA: Insufficient documentation

## 2014-12-24 DIAGNOSIS — Z7982 Long term (current) use of aspirin: Secondary | ICD-10-CM | POA: Insufficient documentation

## 2014-12-24 DIAGNOSIS — Z791 Long term (current) use of non-steroidal anti-inflammatories (NSAID): Secondary | ICD-10-CM | POA: Diagnosis not present

## 2014-12-24 DIAGNOSIS — K219 Gastro-esophageal reflux disease without esophagitis: Secondary | ICD-10-CM | POA: Insufficient documentation

## 2014-12-24 DIAGNOSIS — G8929 Other chronic pain: Secondary | ICD-10-CM | POA: Insufficient documentation

## 2014-12-24 DIAGNOSIS — E785 Hyperlipidemia, unspecified: Secondary | ICD-10-CM | POA: Diagnosis not present

## 2014-12-24 DIAGNOSIS — K449 Diaphragmatic hernia without obstruction or gangrene: Secondary | ICD-10-CM | POA: Insufficient documentation

## 2014-12-24 DIAGNOSIS — Z8711 Personal history of peptic ulcer disease: Secondary | ICD-10-CM | POA: Diagnosis not present

## 2014-12-24 DIAGNOSIS — E119 Type 2 diabetes mellitus without complications: Secondary | ICD-10-CM | POA: Insufficient documentation

## 2014-12-24 DIAGNOSIS — Z79899 Other long term (current) drug therapy: Secondary | ICD-10-CM | POA: Insufficient documentation

## 2014-12-24 DIAGNOSIS — K920 Hematemesis: Secondary | ICD-10-CM

## 2014-12-24 DIAGNOSIS — M549 Dorsalgia, unspecified: Secondary | ICD-10-CM | POA: Insufficient documentation

## 2014-12-24 DIAGNOSIS — K2970 Gastritis, unspecified, without bleeding: Secondary | ICD-10-CM | POA: Diagnosis not present

## 2014-12-24 DIAGNOSIS — G4730 Sleep apnea, unspecified: Secondary | ICD-10-CM | POA: Diagnosis not present

## 2014-12-24 DIAGNOSIS — Z794 Long term (current) use of insulin: Secondary | ICD-10-CM | POA: Diagnosis not present

## 2014-12-24 HISTORY — PX: ESOPHAGOGASTRODUODENOSCOPY: SHX5428

## 2014-12-24 LAB — GLUCOSE, CAPILLARY: Glucose-Capillary: 97 mg/dL (ref 65–99)

## 2014-12-24 SURGERY — EGD (ESOPHAGOGASTRODUODENOSCOPY)
Anesthesia: Moderate Sedation

## 2014-12-24 MED ORDER — MIDAZOLAM HCL 5 MG/5ML IJ SOLN
INTRAMUSCULAR | Status: AC
  Filled 2014-12-24: qty 10

## 2014-12-24 MED ORDER — MIDAZOLAM HCL 5 MG/5ML IJ SOLN
INTRAMUSCULAR | Status: DC | PRN
  Administered 2014-12-24: 2 mg via INTRAVENOUS

## 2014-12-24 MED ORDER — SODIUM CHLORIDE 0.9 % IV SOLN: INTRAVENOUS | Status: DC

## 2014-12-24 MED ORDER — MEPERIDINE HCL 50 MG/ML IJ SOLN
INTRAMUSCULAR | Status: AC
  Filled 2014-12-24: qty 1

## 2014-12-24 MED ORDER — BUTAMBEN-TETRACAINE-BENZOCAINE 2-2-14 % EX AERO
INHALATION_SPRAY | CUTANEOUS | Status: DC | PRN
  Administered 2014-12-24: 2 via TOPICAL

## 2014-12-24 MED ORDER — MEPERIDINE HCL 50 MG/ML IJ SOLN
INTRAMUSCULAR | Status: DC | PRN
  Administered 2014-12-24 (×2): 25 mg via INTRAVENOUS

## 2014-12-24 MED ORDER — STERILE WATER FOR IRRIGATION IR SOLN
Status: DC | PRN
  Administered 2014-12-24: 15:00:00

## 2014-12-24 NOTE — H&P (Signed)
[name] is an 74 y.o. male.   Chief Complaint: Patient is here for EGD. HPI: Patient is 74 year old African-American male with history of peptic ulcer disease and GERD and maintained on PPI was using NSAIDs on as-needed basis present the office 2 days ago with history of nausea and vomiting. He vomited bright red blood. He did not experience post dural symptoms or melena. He hasn't had any more episodes. His hemoglobin was checked and was decreased at 11.6 g. He has history of Schatzki's ring which was dilated back in October 2004 and then July 2006. Presently he is not having any difficulty swallowing.  Past Medical History  Diagnosis Date  . Hyperlipidemia Since 1995    takes Simvastatin daily  . Herpes   . Hypertension Since 1995    takes Metoprolol and Ramipril daily  . Sleep apnea     doesn't use CPAP  . Pneumonia     hx of;as a child  . Arthritis     fingers  . Chronic back pain   . GERD (gastroesophageal reflux disease)     takes Dexilant and Omeprazole daily  . History of gastric ulcer 25+yrs ago  . History of colon polyps   . Urinary urgency     takes Flomax daily  . Urinary frequency   . Diabetes mellitus Since 1995    Takes Glucovance and Onglyza daily  . Diabetes mellitus without complication   . Hypertension     Past Surgical History  Procedure Laterality Date  . Cervical spine surgery    . Rotator cuff repair      Right  . Wrist surgery      Left  . Hand surgery      Right  . Penile prosthesis implant    . Back surgery  1962    Lumbar  . Bilateral cataract surgery    . Cardiac catheterization  02/16/10  . Colonoscopy    . Esophagogastroduodenoscopy (egd) with esophageal dilation    . Lumbar laminectomy/decompression microdiscectomy N/A 08/29/2012    Procedure: LUMBAR LAMINECTOMY/DECOMPRESSION MICRODISCECTOMY 1 LEVEL;  Surgeon: Floyce Stakes, MD;  Location: [place] [facility];  Service: Neurosurgery;  Laterality: N/A;  Lumbar four-five laminectomies   . Colonoscopy N/A 04/23/2013    Procedure: COLONOSCOPY;  Surgeon: Rogene Houston, MD;  Location: [facility];  Service: Endoscopy;  Laterality: N/A;  1030  . Back surgery    . Neck surgery    . Shoulder surgery    . Wrist surgery      Family History  Problem Relation Age of Onset  . Heart attack Father 40    MI  . Hypertension Father   . Heart attack Brother 68    MI  . Heart attack Paternal Uncle   . Diabetes Mother    Social History:  reports that he has never smoked. He has never used smokeless tobacco. He reports that he drinks alcohol. He reports that he uses illicit drugs (Marijuana) about 3 times per week.  Allergies:  Allergies  Allergen Reactions  . Aspirin Other (See Comments)    Higher dosage upsets stomach  . Bayer Aspirin [Aspirin]     Stomach "flares up"    Medications Prior to Admission  Medication Sig Dispense Refill  . aspirin 81 MG EC tablet Take 81 mg by mouth daily.      Marland Kitchen glyBURIDE-metformin (GLUCOVANCE) 5-500 MG per tablet Take 2 tablets by mouth 2 (two) times daily with a meal.     .  metoprolol (TOPROL-XL) 50 MG 24 hr tablet Take 50 mg by mouth daily.      Marland Kitchen omeprazole (PRILOSEC) 40 MG capsule Take 40 mg by mouth daily.    . ramipril (ALTACE) 10 MG capsule Take 10 mg by mouth daily.     . simvastatin (ZOCOR) 20 MG tablet Take 20 mg by mouth daily.    . tamsulosin (FLOMAX) 0.4 MG CAPS capsule Take 0.4 mg by mouth daily.    Marland Kitchen acyclovir (ZOVIRAX) 200 MG capsule Take 200 mg by mouth as needed.     Marland Kitchen LANTUS SOLOSTAR 100 UNIT/ML SOPN Inject 12 Units into the skin at bedtime.    . naproxen (NAPROSYN) 500 MG tablet Take 500 mg by mouth 2 (two) times daily with a meal.    . ondansetron (ZOFRAN ODT) 4 MG disintegrating tablet 4mg  ODT q4 hours prn nausea/vomit (Patient not taking: Reported on 12/22/2014) 4 tablet 0  . oxyCODONE (OXY IR/ROXICODONE) 5 MG immediate release tablet Take 5 mg by mouth 3 (three) times daily as needed for moderate pain. For pain       No results found for this or any previous visit (from the past 48 hour(s)). No results found.  ROS  Blood pressure 157/83, pulse 64, temperature 97.8 F (36.6 C), temperature source Oral, resp. rate 17, height 5\' 10"  (1.778 m), weight 160 lb (72.576 kg), SpO2 100 %. Physical Exam  Constitutional:  Well-developed and African-American male in NAD.  HENT:  Mouth/Throat: Oropharynx is clear and moist.  Eyes: Conjunctivae are normal. No scleral icterus.  Neck: No thyromegaly present.  Cardiovascular: Normal rate, regular rhythm and normal heart sounds.   No murmur heard. Respiratory: Effort normal and breath sounds normal.  GI: Soft. He exhibits no distension and no mass. There is no tenderness.  Musculoskeletal: He exhibits no edema.  Lymphadenopathy:    He has no cervical adenopathy.  Neurological: He is alert.  Skin: Skin is warm and dry.     Assessment/Plan Upper GI bleed. Mild anemia. EGD with ED if he is noted to have prominent Schatzki's ring.  REHMAN,NAJEEB U 12/24/2014, 3:19 PM

## 2014-12-24 NOTE — Op Note (Signed)
EGD PROCEDURE REPORT  PATIENT:  [name]  MR#:  [id number] Birthdate:  [birth year], 74 y.o., male Endoscopist:  Dr. Rogene Houston, MD Referred By:  Dr. Rayne Du ref. provider found Procedure Date: 12/24/2014  Procedure:   EGD  Indications:  Patient is 74 year old African-American male who experienced hematemesis 3 days ago. He was seen in the office 2 days ago and his hemoglobin is 11.6. He has history of GERD. He also has history of peptic ulcer disease. He states he takes naproxen on as-needed basis.            Informed Consent:  The risks, benefits, alternatives & imponderables which include, but are not limited to, bleeding, infection, perforation, drug reaction and potential missed lesion have been reviewed.  The potential for biopsy, lesion removal, esophageal dilation, etc. have also been discussed.  Questions have been answered.  All parties agreeable.  Please see history & physical in medical record for more information.  Medications:  Demerol 50 mg IV Versed 2 mg IV Cetacaine spray topically for oropharyngeal anesthesia  Description of procedure:  The endoscope was introduced through the mouth and advanced to the second portion of the duodenum without difficulty or limitations. The mucosal surfaces were surveyed very carefully during advancement of the scope and upon withdrawal.  Findings:  Esophagus:  Mucosa of the esophagus was normal. GE junction was wavy without erosions ring or stricture formation. GEJ:  41 cm Hiatus:  39 cm Stomach:  Stomach was empty and distended very well with insufflation. Folds in the proximal stomach were normal. Examination of mucosa and gastric body was normal. Antral mucosa was abnormal with small islands may mucosa appeared to be flat. There is also single whitish plaques. Focal prepyloric erythema and erosion noted. Pyloric channel was patent. Duodenum:  Normal bulbar and post bulbar mucosa.  Therapeutic/Diagnostic Maneuvers Performed:   Biopsy taken from antral mucosa along with biopsy of whitish plaques. All tissue was submitted together.  Complications:  None  Impression: Small sliding hiatal hernia without evidence of erosive esophagitis ring or stricture formation. Antral gastritis with small whitish plaque. Biopsy taken.  Comment; Since no bleeding lesion identified suspect he bled from Mallory-Weiss tear which has healed by now.  Recommendations:  Standard instructions given. Patient will continue low-dose aspirin but discontinue Naprosyn. I will be contacting patient with biopsy results. CBC in one month.   REHMAN,NAJEEB U  12/24/2014  3:39 PM  CC: Dr. Maricela Curet, MD & Dr. Rayne Du ref. provider found

## 2014-12-24 NOTE — Discharge Instructions (Signed)
Do not take naproxen or other NSAIDs. Resume usual medications and diet. No driving for 24 hours. Physician will call with biopsy results. CBC in one month.    Esophagogastroduodenoscopy Care After Refer to this sheet in the next few weeks. These instructions provide you with information on caring for yourself after your procedure. Your caregiver may also give you more specific instructions. Your treatment has been planned according to current medical practices, but problems sometimes occur. Call your caregiver if you have any problems or questions after your procedure.  HOME CARE INSTRUCTIONS  Do not eat or drink anything until the numbing medicine (local anesthetic) has worn off and your gag reflex has returned. You will know that the local anesthetic has worn off when you can swallow comfortably.  Do not drive for 12 hours after the procedure or as directed by your caregiver.  Only take medicines as directed by your caregiver. SEEK MEDICAL CARE IF:   You cannot stop coughing.  You are not urinating at all or less than usual. SEEK IMMEDIATE MEDICAL CARE IF:  You have difficulty swallowing.  You cannot eat or drink.  You have worsening throat or chest pain.  You have dizziness, lightheadedness, or you faint.  You have nausea or vomiting.  You have chills.  You have a fever.  You have severe abdominal pain.  You have black, tarry, or bloody stools. Document Released: 06/11/2012 Document Reviewed: 06/11/2012 [facility] Patient Information 2015 [place]. This information is not intended to replace advice given to you by your health care provider. Make sure you discuss any questions you have with your health care provider.    Hiatal Hernia A hiatal hernia occurs when part of your stomach slides above the muscle that separates your abdomen from your chest (diaphragm). You can be born with a hiatal hernia (congenital), or it may develop over time. In almost all cases  of hiatal hernia, only the top part of the stomach pushes through.  Many people have a hiatal hernia with no symptoms. The larger the hernia, the more likely that you will have symptoms. In some cases, a hiatal hernia allows stomach acid to flow back into the tube that carries food from your mouth to your stomach (esophagus). This may cause heartburn symptoms. Severe heartburn symptoms may mean you have developed a condition called gastroesophageal reflux disease (GERD).  CAUSES  Hiatal hernias are caused by a weakness in the opening (hiatus) where your esophagus passes through your diaphragm to attach to the upper part of your stomach. You may be born with a weakness in your hiatus, or a weakness can develop. RISK FACTORS Older age is a major risk factor for a hiatal hernia. Anything that increases pressure on your diaphragm can also increase your risk of a hiatal hernia. This includes:  Pregnancy.  Excess weight.  Frequent constipation. SIGNS AND SYMPTOMS  People with a hiatal hernia often have no symptoms. If symptoms develop, they are almost always caused by GERD. They may include:  Heartburn.  Belching.  Indigestion.  Trouble swallowing.  Coughing or wheezing.  Sore throat.  Hoarseness.  Chest pain. DIAGNOSIS  A hiatal hernia is sometimes found during an exam for another problem. Your health care provider may suspect a hiatal hernia if you have symptoms of GERD. Tests may be done to diagnose GERD. These may include:  X-rays of your stomach or chest.  An upper gastrointestinal (GI) series. This is an X-ray exam of your GI tract involving the use  of a chalky liquid that you swallow. The liquid shows up clearly on the X-ray.  Endoscopy. This is a procedure to look into your stomach using a thin, flexible tube that has a tiny camera and light on the end of it. TREATMENT  If you have no symptoms, you may not need treatment. If you have symptoms, treatment may  include:  Dietary and lifestyle changes to help reduce GERD symptoms.  Medicines. These may include:  Over-the-counter antacids.  Medicines that make your stomach empty more quickly.  Medicines that block the production of stomach acid (H2 blockers).  Stronger medicines to reduce stomach acid (proton pump inhibitors).  You may need surgery to repair the hernia if other treatments are not helping. HOME CARE INSTRUCTIONS   Take all medicines as directed by your health care provider.  Quit smoking, if you smoke.  Try to achieve and maintain a healthy body weight.  Eat frequent small meals instead of three large meals a day. This keeps your stomach from getting too full.  Eat slowly.  Do not lie down right after eating.  Do noteat 1-2 hours before bed.   Do not drink beverages with caffeine. These include cola, coffee, cocoa, and tea.  Do not drink alcohol.  Avoid foods that can make symptoms of GERD worse. These may include:  Fatty foods.  Citrus fruits.  Other foods and drinks that contain acid.  Avoid putting pressure on your belly. Anything that puts pressure on your belly increases the amount of acid that may be pushed up into your esophagus.   Avoid bending over, especially after eating.  Raise the head of your bed by putting blocks under the legs. This keeps your head and esophagus higher than your stomach.  Do not wear tight clothing around your chest or stomach.  Try not to strain when having a bowel movement, when urinating, or when lifting heavy objects. SEEK MEDICAL CARE IF:  Your symptoms are not controlled with medicines or lifestyle changes.  You are having trouble swallowing.  You have coughing or wheezing that will not go away. SEEK IMMEDIATE MEDICAL CARE IF:  Your pain is getting worse.  Your pain spreads to your arms, neck, jaw, teeth, or back.  You have shortness of breath.  You sweat for no reason.  You feel sick to your  stomach (nauseous) or vomit.  You vomit blood.  You have bright red blood in your stools.  You have black, tarry stools.  Document Released: 09/15/2003 Document Revised: 11/09/2013 Document Reviewed: 06/12/2013 [facility] Patient Information 2015 [place], [place]. This information is not intended to replace advice given to you by your health care provider. Make sure you discuss any questions you have with your health care provider.

## 2014-12-27 ENCOUNTER — Telehealth (INDEPENDENT_AMBULATORY_CARE_PROVIDER_SITE_OTHER): Payer: Self-pay | Admitting: *Deleted

## 2014-12-27 ENCOUNTER — Encounter (HOSPITAL_COMMUNITY): Payer: Self-pay | Admitting: Internal Medicine

## 2014-12-27 DIAGNOSIS — D649 Anemia, unspecified: Secondary | ICD-10-CM

## 2014-12-27 NOTE — Telephone Encounter (Signed)
Per Dr.Rehman the patient will need to have labs drawn in 1 month. 

## 2014-12-29 ENCOUNTER — Other Ambulatory Visit (INDEPENDENT_AMBULATORY_CARE_PROVIDER_SITE_OTHER): Payer: Self-pay | Admitting: *Deleted

## 2014-12-29 ENCOUNTER — Encounter (INDEPENDENT_AMBULATORY_CARE_PROVIDER_SITE_OTHER): Payer: Self-pay | Admitting: *Deleted

## 2014-12-29 DIAGNOSIS — D649 Anemia, unspecified: Secondary | ICD-10-CM

## 2015-01-03 ENCOUNTER — Encounter (INDEPENDENT_AMBULATORY_CARE_PROVIDER_SITE_OTHER): Payer: Self-pay | Admitting: *Deleted

## 2015-01-26 LAB — CBC
HCT: 33.6 % — ABNORMAL LOW (ref 39.0–52.0)
Hemoglobin: 11.6 g/dL — ABNORMAL LOW (ref 13.0–17.0)
MCH: 30.8 pg (ref 26.0–34.0)
MCHC: 34.5 g/dL (ref 30.0–36.0)
MCV: 89.1 fL (ref 78.0–100.0)
MPV: 9.5 fL (ref 8.6–12.4)
PLATELETS: 195 10*3/uL (ref 150–400)
RBC: 3.77 MIL/uL — AB (ref 4.22–5.81)
RDW: 13.7 % (ref 11.5–15.5)
WBC: 4.2 10*3/uL (ref 4.0–10.5)

## 2015-02-03 ENCOUNTER — Telehealth (INDEPENDENT_AMBULATORY_CARE_PROVIDER_SITE_OTHER): Payer: Self-pay | Admitting: *Deleted

## 2015-02-03 DIAGNOSIS — D649 Anemia, unspecified: Secondary | ICD-10-CM

## 2015-02-03 DIAGNOSIS — B182 Chronic viral hepatitis C: Secondary | ICD-10-CM

## 2015-02-03 NOTE — Telephone Encounter (Signed)
Per Dr.Rehman the patient will need to have labs drawn. 

## 2015-02-04 LAB — IRON AND TIBC
%SAT: 28 % (ref 20–55)
Iron: 106 ug/dL (ref 42–165)
TIBC: 376 ug/dL (ref 215–435)
UIBC: 270 ug/dL (ref 125–400)

## 2015-02-04 LAB — FOLATE: Folate: 12.2 ng/mL

## 2015-02-04 LAB — FERRITIN: Ferritin: 21 ng/mL — ABNORMAL LOW (ref 22–322)

## 2015-02-15 ENCOUNTER — Telehealth (INDEPENDENT_AMBULATORY_CARE_PROVIDER_SITE_OTHER): Payer: Self-pay | Admitting: *Deleted

## 2015-02-15 DIAGNOSIS — D649 Anemia, unspecified: Secondary | ICD-10-CM

## 2015-02-15 NOTE — Telephone Encounter (Signed)
Per Dr.Rehman the patient will need to have labs drawn in 1 month. 

## 2015-03-01 ENCOUNTER — Other Ambulatory Visit (INDEPENDENT_AMBULATORY_CARE_PROVIDER_SITE_OTHER): Payer: Self-pay | Admitting: *Deleted

## 2015-03-01 ENCOUNTER — Encounter (INDEPENDENT_AMBULATORY_CARE_PROVIDER_SITE_OTHER): Payer: Self-pay | Admitting: *Deleted

## 2015-03-01 DIAGNOSIS — D649 Anemia, unspecified: Secondary | ICD-10-CM

## 2015-03-17 ENCOUNTER — Encounter (INDEPENDENT_AMBULATORY_CARE_PROVIDER_SITE_OTHER): Payer: Self-pay | Admitting: Internal Medicine

## 2015-03-17 ENCOUNTER — Ambulatory Visit (INDEPENDENT_AMBULATORY_CARE_PROVIDER_SITE_OTHER): Payer: Medicare HMO | Admitting: Internal Medicine

## 2015-03-17 VITALS — BP 142/74 | HR 60 | Temp 98.0°F | Ht 70.0 in | Wt 158.8 lb

## 2015-03-17 DIAGNOSIS — K219 Gastro-esophageal reflux disease without esophagitis: Secondary | ICD-10-CM

## 2015-03-17 NOTE — Progress Notes (Signed)
Subjective:    Patient ID: [name], male    DOB: 12/30/1940, 74 y.o.   MRN: [id number]  HPI Here today for f/u. He says he just doesn't have the energy he use to have. For the most part, he feels good. His appetite is good. He has gained 7 pounds since his last visit. His acid reflux is controlled with the Omeprazole. He underwent an EGD in June for hematemesis. ? Mallory Weiss tear. No abdominal pain. Has a BM x 1 a day. No melena or BRRB.  No nausea,vomiting, or fever        12/24/2014 EGD  Indications: Patient is 74 year old African-American male who experienced hematemesis 3 days ago. He was seen in the office 2 days ago and his hemoglobin is 11.6. He has history of GERD. He also has history of peptic ulcer disease. He states he takes naproxen on as-needed basis.  Impression: Small sliding hiatal hernia without evidence of erosive esophagitis ring or stricture formation. Antral gastritis with small whitish plaque. Biopsy taken.  Since no bleeding lesion identified suspect he bled from Mallory-Weiss tear which has healed by now.   04/23/2013 Colonoscopy  Indications: Patient is 74 year old African male with history of colonic adenomas who is here for surveillance colonoscopy. His last exam was 5 years ago. Impression:  Examination performed to cecum. Three small polyps ablated via cold biopsy and submitted together(one at ascending colon and two at hepatic flexure). Internal/external hemorrhoids.  Biopsy results reviewed with the patient's son [name]. 3 small polyps removed and these are tubular adenomas. Patient has had adenomas in the past as well Reports to PCP Next colonoscopy in 5 years  CBC    Component Value Date/Time   WBC 4.2 01/26/2015 0905   RBC 3.77* 01/26/2015 0905   HGB 11.6* 01/26/2015 0905   HCT 33.6* 01/26/2015 0905   PLT 195  01/26/2015 0905   MCV 89.1 01/26/2015 0905   MCH 30.8 01/26/2015 0905   MCHC 34.5 01/26/2015 0905   RDW 13.7 01/26/2015 0905   LYMPHSABS 1.9 12/22/2014 1013   MONOABS 0.4 12/22/2014 1013   EOSABS 0.1 12/22/2014 1013   BASOSABS 0.0 12/22/2014 1013   Hepatic Function Panel     Component Value Date/Time   PROT 7.1 09/15/2014 1509   ALBUMIN 3.9 09/15/2014 1509   AST 15 09/15/2014 1509   ALT 13 09/15/2014 1509   ALKPHOS 37* 09/15/2014 1509   BILITOT 0.4 09/15/2014 1509   BILIDIR 0.1 09/15/2014 1509   IBILI 0.3 09/15/2014 1509       Review of Systems  Past Medical History  Diagnosis Date  . Hyperlipidemia Since 1995    takes Simvastatin daily  . Herpes   . Hypertension Since 1995    takes Metoprolol and Ramipril daily  . Sleep apnea     doesn't use CPAP  . Pneumonia     hx of;as a child  . Arthritis     fingers  . Chronic back pain   . GERD (gastroesophageal reflux disease)     takes Dexilant and Omeprazole daily  . History of gastric ulcer 25+yrs ago  . History of colon polyps   . Urinary urgency     takes Flomax daily  . Urinary frequency   . Diabetes mellitus Since 1995    Takes Glucovance and Onglyza daily  . Diabetes mellitus without complication   . Hypertension     Past Surgical History  Procedure Laterality Date  . Cervical spine surgery    .  Rotator cuff repair      Right  . Wrist surgery      Left  . Hand surgery      Right  . Penile prosthesis implant    . Back surgery  1962    Lumbar  . Bilateral cataract surgery    . Cardiac catheterization  02/16/10  . Colonoscopy    . Esophagogastroduodenoscopy (egd) with esophageal dilation    . Lumbar laminectomy/decompression microdiscectomy N/A 08/29/2012    Procedure: LUMBAR LAMINECTOMY/DECOMPRESSION MICRODISCECTOMY 1 LEVEL;  Surgeon: Floyce Stakes, MD;  Location: [facility];  Service: Neurosurgery;  Laterality: N/A;  Lumbar four-five laminectomies  . Colonoscopy N/A 04/23/2013    Procedure:  COLONOSCOPY;  Surgeon: Rogene Houston, MD;  [facility];  Service: Endoscopy;  Laterality: N/A;  1030  . Back surgery    . Neck surgery    . Shoulder surgery    . Wrist surgery    . Esophagogastroduodenoscopy N/A 12/24/2014    Procedure: ESOPHAGOGASTRODUODENOSCOPY (EGD);  Surgeon: Rogene Houston, MD;  Location: [facility];  Service: Endoscopy;  Laterality: N/A;  2:45    Allergies  Allergen Reactions  . Aspirin Other (See Comments)    Higher dosage upsets stomach  . Bayer Aspirin [Aspirin]     Stomach "flares up"    Current Outpatient Prescriptions on File Prior to Visit  Medication Sig Dispense Refill  . acyclovir (ZOVIRAX) 200 MG capsule Take 200 mg by mouth as needed.     Marland Kitchen aspirin 81 MG EC tablet Take 81 mg by mouth daily.      Marland Kitchen glyBURIDE-metformin (GLUCOVANCE) 5-500 MG per tablet Take 2 tablets by mouth 2 (two) times daily with a meal.     . LANTUS SOLOSTAR 100 UNIT/ML SOPN Inject 12 Units into the skin at bedtime.    . metoprolol (TOPROL-XL) 50 MG 24 hr tablet Take 50 mg by mouth daily.      Marland Kitchen omeprazole (PRILOSEC) 40 MG capsule Take 40 mg by mouth daily.    Marland Kitchen oxyCODONE (OXY IR/ROXICODONE) 5 MG immediate release tablet Take 5 mg by mouth 3 (three) times daily as needed for moderate pain. For pain    . ramipril (ALTACE) 10 MG capsule Take 10 mg by mouth daily.     . simvastatin (ZOCOR) 20 MG tablet Take 20 mg by mouth daily.    . tamsulosin (FLOMAX) 0.4 MG CAPS capsule Take 0.4 mg by mouth daily.    . ondansetron (ZOFRAN ODT) 4 MG disintegrating tablet 4mg  ODT q4 hours prn nausea/vomit (Patient not taking: Reported on 03/17/2015) 4 tablet 0  . [DISCONTINUED] bismuth-metronidazole-tetracycline (PLYERA) 140-125-125 MG per capsule Take 3 capsules by mouth 4 (four) times daily -  before meals and at bedtime.     No current facility-administered medications on file prior to visit.         Objective:   Physical Exam Blood pressure 142/74, pulse 60, temperature 98  F (36.7 C), height 5\' 10"  (1.778 m), weight 158 lb 12.8 oz (72.031 kg).  Alert and oriented. Skin warm and dry. Oral mucosa is moist.   . Sclera anicteric, conjunctivae is pink. Thyroid not enlarged. No cervical lymphadenopathy. Lungs clear. Heart regular rate and rhythm.  Abdomen is soft. Bowel sounds are positive. No hepatomegaly. No abdominal masses felt. No tenderness.  No edema to lower extremities.        Assessment & Plan:  GERD. Controlled at this time with Omeprazole. Patient will go up  stairs and have Blood work drawn.  Anemia: Presently taking MVI with iron. He will go up stairs have H and H drawn.  OV 1 yr.

## 2015-03-17 NOTE — Patient Instructions (Signed)
OV in 1 yr. Continue the Omeprazole

## 2015-03-18 LAB — HEMOGLOBIN AND HEMATOCRIT, BLOOD
HCT: 34.6 % — ABNORMAL LOW (ref 39.0–52.0)
HEMOGLOBIN: 11.3 g/dL — AB (ref 13.0–17.0)

## 2015-03-21 ENCOUNTER — Telehealth (INDEPENDENT_AMBULATORY_CARE_PROVIDER_SITE_OTHER): Payer: Self-pay | Admitting: *Deleted

## 2015-03-21 DIAGNOSIS — Z8719 Personal history of other diseases of the digestive system: Secondary | ICD-10-CM

## 2015-03-21 DIAGNOSIS — D649 Anemia, unspecified: Secondary | ICD-10-CM

## 2015-03-21 DIAGNOSIS — Z8711 Personal history of peptic ulcer disease: Secondary | ICD-10-CM

## 2015-03-21 DIAGNOSIS — Z86010 Personal history of colonic polyps: Secondary | ICD-10-CM

## 2015-03-21 DIAGNOSIS — D508 Other iron deficiency anemias: Secondary | ICD-10-CM

## 2015-03-21 NOTE — Telephone Encounter (Signed)
Per Dr.Rehman the patient will need to have labs drawn in 1 month. 

## 2015-03-29 ENCOUNTER — Other Ambulatory Visit (INDEPENDENT_AMBULATORY_CARE_PROVIDER_SITE_OTHER): Payer: Self-pay | Admitting: *Deleted

## 2015-03-29 ENCOUNTER — Encounter (INDEPENDENT_AMBULATORY_CARE_PROVIDER_SITE_OTHER): Payer: Self-pay | Admitting: *Deleted

## 2015-03-29 DIAGNOSIS — Z8711 Personal history of peptic ulcer disease: Secondary | ICD-10-CM

## 2015-03-29 DIAGNOSIS — D508 Other iron deficiency anemias: Secondary | ICD-10-CM

## 2015-03-29 DIAGNOSIS — Z86010 Personal history of colonic polyps: Secondary | ICD-10-CM

## 2015-03-29 DIAGNOSIS — Z8719 Personal history of other diseases of the digestive system: Secondary | ICD-10-CM

## 2015-03-29 DIAGNOSIS — D649 Anemia, unspecified: Secondary | ICD-10-CM

## 2015-04-21 LAB — HEMOGLOBIN AND HEMATOCRIT, BLOOD
HCT: 35.7 % — ABNORMAL LOW (ref 39.0–52.0)
Hemoglobin: 12.4 g/dL — ABNORMAL LOW (ref 13.0–17.0)

## 2015-04-21 LAB — VITAMIN B12: Vitamin B-12: 680 pg/mL (ref 211–911)

## 2015-04-22 ENCOUNTER — Telehealth (INDEPENDENT_AMBULATORY_CARE_PROVIDER_SITE_OTHER): Payer: Self-pay | Admitting: *Deleted

## 2015-04-22 DIAGNOSIS — D508 Other iron deficiency anemias: Secondary | ICD-10-CM

## 2015-04-22 DIAGNOSIS — Z8719 Personal history of other diseases of the digestive system: Secondary | ICD-10-CM

## 2015-04-22 DIAGNOSIS — Z8711 Personal history of peptic ulcer disease: Secondary | ICD-10-CM

## 2015-04-22 NOTE — Telephone Encounter (Signed)
Per Dr.Rehman the patient will need to have labs drawn 10 weeks.

## 2015-04-22 NOTE — Progress Notes (Signed)
Lab is noted for 10 weeks,07-01-15.

## 2015-06-01 ENCOUNTER — Encounter (INDEPENDENT_AMBULATORY_CARE_PROVIDER_SITE_OTHER): Payer: Self-pay | Admitting: *Deleted

## 2015-06-01 ENCOUNTER — Other Ambulatory Visit (INDEPENDENT_AMBULATORY_CARE_PROVIDER_SITE_OTHER): Payer: Self-pay | Admitting: *Deleted

## 2015-06-01 DIAGNOSIS — D508 Other iron deficiency anemias: Secondary | ICD-10-CM

## 2015-06-01 DIAGNOSIS — Z8711 Personal history of peptic ulcer disease: Secondary | ICD-10-CM

## 2015-06-01 DIAGNOSIS — Z8719 Personal history of other diseases of the digestive system: Secondary | ICD-10-CM

## 2015-06-06 ENCOUNTER — Encounter (HOSPITAL_COMMUNITY): Payer: Self-pay | Admitting: Emergency Medicine

## 2015-06-06 ENCOUNTER — Emergency Department (HOSPITAL_COMMUNITY)
Admission: EM | Admit: 2015-06-06 | Discharge: 2015-06-06 | Disposition: A | Discharge status: Home / Self Care | Payer: Medicare HMO | Attending: Emergency Medicine | Admitting: Emergency Medicine

## 2015-06-06 DIAGNOSIS — Y9389 Activity, other specified: Secondary | ICD-10-CM | POA: Diagnosis not present

## 2015-06-06 DIAGNOSIS — I10 Essential (primary) hypertension: Secondary | ICD-10-CM | POA: Insufficient documentation

## 2015-06-06 DIAGNOSIS — Y998 Other external cause status: Secondary | ICD-10-CM | POA: Diagnosis not present

## 2015-06-06 DIAGNOSIS — S3991XA Unspecified injury of abdomen, initial encounter: Secondary | ICD-10-CM | POA: Insufficient documentation

## 2015-06-06 DIAGNOSIS — S29001A Unspecified injury of muscle and tendon of front wall of thorax, initial encounter: Secondary | ICD-10-CM | POA: Diagnosis not present

## 2015-06-06 DIAGNOSIS — Z86010 Personal history of colonic polyps: Secondary | ICD-10-CM | POA: Diagnosis not present

## 2015-06-06 DIAGNOSIS — Y9289 Other specified places as the place of occurrence of the external cause: Secondary | ICD-10-CM | POA: Insufficient documentation

## 2015-06-06 DIAGNOSIS — W1849XA Other slipping, tripping and stumbling without falling, initial encounter: Secondary | ICD-10-CM | POA: Diagnosis not present

## 2015-06-06 DIAGNOSIS — K219 Gastro-esophageal reflux disease without esophagitis: Secondary | ICD-10-CM | POA: Diagnosis not present

## 2015-06-06 DIAGNOSIS — R109 Unspecified abdominal pain: Secondary | ICD-10-CM

## 2015-06-06 DIAGNOSIS — Z7982 Long term (current) use of aspirin: Secondary | ICD-10-CM | POA: Insufficient documentation

## 2015-06-06 DIAGNOSIS — Z794 Long term (current) use of insulin: Secondary | ICD-10-CM | POA: Diagnosis not present

## 2015-06-06 DIAGNOSIS — E785 Hyperlipidemia, unspecified: Secondary | ICD-10-CM | POA: Insufficient documentation

## 2015-06-06 DIAGNOSIS — Z79899 Other long term (current) drug therapy: Secondary | ICD-10-CM | POA: Insufficient documentation

## 2015-06-06 DIAGNOSIS — Z8619 Personal history of other infectious and parasitic diseases: Secondary | ICD-10-CM | POA: Insufficient documentation

## 2015-06-06 DIAGNOSIS — E119 Type 2 diabetes mellitus without complications: Secondary | ICD-10-CM | POA: Insufficient documentation

## 2015-06-06 DIAGNOSIS — G8929 Other chronic pain: Secondary | ICD-10-CM | POA: Diagnosis not present

## 2015-06-06 DIAGNOSIS — Z8701 Personal history of pneumonia (recurrent): Secondary | ICD-10-CM | POA: Diagnosis not present

## 2015-06-06 MED ORDER — CYCLOBENZAPRINE HCL 5 MG PO TABS: 5.0000 mg | ORAL_TABLET | Freq: Two times a day (BID) | ORAL | Status: DC | PRN

## 2015-06-06 NOTE — Discharge Instructions (Signed)
Flank Pain °Flank pain refers to pain that is located on the side of the body between the upper abdomen and the back. The pain may occur over a short period of time (acute) or may be long-term or reoccurring (chronic). It may be mild or severe. Flank pain can be caused by many things. °CAUSES  °Some of the more common causes of flank pain include: °· Muscle strains.   °· Muscle spasms.   °· A disease of your spine (vertebral disk disease).   °· A lung infection (pneumonia).   °· Fluid around your lungs (pulmonary edema).   °· A kidney infection.   °· Kidney stones.   °· A very painful skin rash caused by the chickenpox virus (shingles).   °· Gallbladder disease.   °HOME CARE INSTRUCTIONS  °Home care will depend on the cause of your pain. In general, °· Rest as directed by your caregiver. °· Drink enough fluids to keep your urine clear or pale yellow. °· Only take over-the-counter or prescription medicines as directed by your caregiver. Some medicines may help relieve the pain. °· Tell your caregiver about any changes in your pain. °· Follow up with your caregiver as directed. °SEEK IMMEDIATE MEDICAL CARE IF:  °· Your pain is not controlled with medicine.   °· You have new or worsening symptoms. °· Your pain increases.   °· You have abdominal pain.   °· You have shortness of breath.   °· You have persistent nausea or vomiting.   °· You have swelling in your abdomen.   °· You feel faint or pass out.   °· You have blood in your urine. °· You have a fever or persistent symptoms for more than 2-3 days. °· You have a fever and your symptoms suddenly get worse. °MAKE SURE YOU:  °· Understand these instructions. °· Will watch your condition. °· Will get help right away if you are not doing well or get worse. °  °This information is not intended to replace advice given to you by your health care provider. Make sure you discuss any questions you have with your health care provider. °  °Document Released: 08/16/2005 Document  Revised: 03/19/2012 Document Reviewed: 02/07/2012 °Elsevier Interactive Patient Education ©2016 [employer] Inc[employer] ° °

## 2015-06-06 NOTE — ED Provider Notes (Signed)
CSN: [id number]     Arrival date & time 06/06/15  0932 History  By signing my name below, I, Eustaquio Maize, attest that this documentation has been prepared under the direction and in the presence of Davonna Belling, MD. Electronically Signed: Eustaquio Maize, ED Scribe. 06/06/2015. 9:54 AM.  Chief Complaint  Patient presents with  . Flank Pain   The history is provided by the patient. No language interpreter was used.     HPI Comments: [name] is a 74 y.o. male who presents to the Emergency Department complaining of gradual onset, constant, left rib pain x 1.5 weeks. Pt reports that 1.5 weeks ago he had to push a car and was slipping on grass while pushing the car. He denies falling while pushing the car or any trauma since then. The pain is exacerbated when pt turns to the left. There is no pain when twisting to the right. Pt took Roxycodone without relief. Pt does mention that his LUQ abdomen felt "hard" yesterday but denies any pain. No shortness of breath, nausea, vomiting, diarrhea, or any other associated symptoms.   Past Medical History  Diagnosis Date  . Hyperlipidemia Since 1995    takes Simvastatin daily  . Herpes   . Hypertension Since 1995    takes Metoprolol and Ramipril daily  . Sleep apnea     doesn't use CPAP  . Pneumonia     hx of;as a child  . Arthritis     fingers  . Chronic back pain   . GERD (gastroesophageal reflux disease)     takes Dexilant and Omeprazole daily  . History of gastric ulcer 25+yrs ago  . History of colon polyps   . Urinary urgency     takes Flomax daily  . Urinary frequency   . Diabetes mellitus Since 1995    Takes Glucovance and Onglyza daily  . Diabetes mellitus without complication ([place])   . Hypertension    Past Surgical History  Procedure Laterality Date  . Cervical spine surgery    . Rotator cuff repair      Right  . Wrist surgery      Left  . Hand surgery      Right  . Penile prosthesis implant    . Back  surgery  1962    Lumbar  . Bilateral cataract surgery    . Cardiac catheterization  02/16/10  . Colonoscopy    . Esophagogastroduodenoscopy (egd) with esophageal dilation    . Lumbar laminectomy/decompression microdiscectomy N/A 08/29/2012    Procedure: LUMBAR LAMINECTOMY/DECOMPRESSION MICRODISCECTOMY 1 LEVEL;  Surgeon: Floyce Stakes, MD;  Location: [facility];  Service: Neurosurgery;  Laterality: N/A;  Lumbar four-five laminectomies  . Colonoscopy N/A 04/23/2013    Procedure: COLONOSCOPY;  Surgeon: Rogene Houston, MD;  [facility];  Service: Endoscopy;  Laterality: N/A;  1030  . Back surgery    . Neck surgery    . Shoulder surgery    . Wrist surgery    . Esophagogastroduodenoscopy N/A 12/24/2014    Procedure: ESOPHAGOGASTRODUODENOSCOPY (EGD);  Surgeon: Rogene Houston, MD;  [facility];  Service: Endoscopy;  Laterality: N/A;  2:45   Family History  Problem Relation Age of Onset  . Heart attack Father 14    MI  . Hypertension Father   . Heart attack Brother 3    MI  . Heart attack Paternal Uncle   . Diabetes Mother    Social History  Substance Use Topics  .  Smoking status: Never Smoker   . Smokeless tobacco: Never Used  . Alcohol Use: Yes     Comment: occasionally beer    Review of Systems  Constitutional: Negative for fever and chills.  Respiratory: Negative for shortness of breath.   Gastrointestinal: Negative for nausea, vomiting, abdominal pain, diarrhea and constipation.  Musculoskeletal: Positive for arthralgias (Left rib pain).  Skin: Negative for rash.   Allergies  Aspirin and Bayer aspirin  Home Medications   Prior to Admission medications   Medication Sig Start Date End Date Taking? Authorizing Provider  acyclovir (ZOVIRAX) 200 MG capsule Take 200 mg by mouth as needed.  01/07/14   Historical Provider, MD  aspirin 81 MG EC tablet Take 81 mg by mouth daily.      Historical Provider, MD  cyclobenzaprine (FLEXERIL) 5 MG tablet Take 1  tablet (5 mg total) by mouth 2 (two) times daily as needed for muscle spasms. 06/06/15   Davonna Belling, MD  glyBURIDE-metformin (GLUCOVANCE) 5-500 MG per tablet Take 2 tablets by mouth 2 (two) times daily with a meal.     Historical Provider, MD  LANTUS SOLOSTAR 100 UNIT/ML SOPN Inject 12 Units into the skin at bedtime. 04/15/13   Historical Provider, MD  metoprolol (TOPROL-XL) 50 MG 24 hr tablet Take 50 mg by mouth daily.      Historical Provider, MD  Multiple Vitamins-Minerals (MULTIVITAMIN ADULTS 50+ PO) Take by mouth.    Historical Provider, MD  omeprazole (PRILOSEC) 40 MG capsule Take 40 mg by mouth daily.    Historical Provider, MD  ondansetron (ZOFRAN ODT) 4 MG disintegrating tablet 4mg  ODT q4 hours prn nausea/vomit Patient not taking: Reported on 03/17/2015 05/15/14   Shanon Rosser, MD  oxyCODONE (OXY IR/ROXICODONE) 5 MG immediate release tablet Take 5 mg by mouth 3 (three) times daily as needed for moderate pain. For pain 06/16/13   Historical Provider, MD  ramipril (ALTACE) 10 MG capsule Take 10 mg by mouth daily.  02/05/14   Historical Provider, MD  simvastatin (ZOCOR) 20 MG tablet Take 20 mg by mouth daily. 04/15/13   Historical Provider, MD  tamsulosin (FLOMAX) 0.4 MG CAPS capsule Take 0.4 mg by mouth daily.    Historical Provider, MD   Triage Vitals: BP 175/103 mmHg  Pulse 71  Temp(Src) 98 F (36.7 C) (Oral)  Resp 16  Ht 5\' 10"  (1.778 m)  Wt 160 lb (72.576 kg)  BMI 22.96 kg/m2  SpO2 100%   Physical Exam  Constitutional: He is oriented to person, place, and time. He appears well-developed and well-nourished. No distress.  HENT:  Head: Normocephalic and atraumatic.  Eyes: Conjunctivae and EOM are normal.  Neck: Neck supple. No tracheal deviation present.  Cardiovascular: Normal rate.   Pulmonary/Chest: Effort normal. No respiratory distress.  Abdominal: Soft. There is no tenderness. There is no CVA tenderness.  No abdominal or subcostal tenderness  Musculoskeletal: Normal range  of motion. He exhibits tenderness.  Tenderness over the lowest rib on the left lateral flank  Neurological: He is alert and oriented to person, place, and time.  Skin: Skin is warm and dry. No rash noted.  Psychiatric: He has a normal mood and affect. His behavior is normal.  Nursing note and vitals reviewed.   ED Course  Procedures (including critical care time)  DIAGNOSTIC STUDIES: Oxygen Saturation is 100% on RA, normal by my interpretation.    COORDINATION OF CARE: 9:53 AM-Discussed treatment plan with pt at bedside and pt agreed to plan.   Labs  Review Labs Reviewed - No data to display  Imaging Review No results found.   EKG Interpretation None      MDM   Final diagnoses:  Flank pain   Patient with tenderness to left lower ribs after pushing a car around 10 days ago. Worse with rotation. No abdominal tenderness. Likely muscle skeletal. Will discharge home with some muscle relaxers, patient has pain medicine. No rash.  I personally performed the services described in this documentation, which was scribed in my presence. The recorded information has been reviewed and is accurate.        Davonna Belling, MD 06/06/15 1004

## 2015-06-06 NOTE — ED Notes (Signed)
Pt states he was pushing a car on Friday and slipped on the grass a few times.  Denies fall but states that the next morning his left flank was hurting.  States that it hurts to move.

## 2015-06-15 ENCOUNTER — Ambulatory Visit (INDEPENDENT_AMBULATORY_CARE_PROVIDER_SITE_OTHER): Payer: Medicare HMO | Admitting: Internal Medicine

## 2015-06-15 ENCOUNTER — Encounter (INDEPENDENT_AMBULATORY_CARE_PROVIDER_SITE_OTHER): Payer: Self-pay | Admitting: Internal Medicine

## 2015-06-15 VITALS — BP 136/72 | HR 84 | Temp 98.4°F | Ht 70.0 in | Wt 158.9 lb

## 2015-06-15 DIAGNOSIS — K921 Melena: Secondary | ICD-10-CM | POA: Diagnosis not present

## 2015-06-15 LAB — COMPREHENSIVE METABOLIC PANEL
ALBUMIN: 3.8 g/dL (ref 3.6–5.1)
ALT: 14 U/L (ref 9–46)
AST: 17 U/L (ref 10–35)
Alkaline Phosphatase: 35 U/L — ABNORMAL LOW (ref 40–115)
BILIRUBIN TOTAL: 0.4 mg/dL (ref 0.2–1.2)
BUN: 14 mg/dL (ref 7–25)
CO2: 27 mmol/L (ref 20–31)
Calcium: 9.2 mg/dL (ref 8.6–10.3)
Chloride: 99 mmol/L (ref 98–110)
Creat: 0.96 mg/dL (ref 0.70–1.18)
Glucose, Bld: 221 mg/dL — ABNORMAL HIGH (ref 65–99)
Potassium: 4.2 mmol/L (ref 3.5–5.3)
SODIUM: 137 mmol/L (ref 135–146)
Total Protein: 6.9 g/dL (ref 6.1–8.1)

## 2015-06-15 LAB — CBC
HCT: 35.1 % — ABNORMAL LOW (ref 39.0–52.0)
Hemoglobin: 11.9 g/dL — ABNORMAL LOW (ref 13.0–17.0)
MCH: 31.1 pg (ref 26.0–34.0)
MCHC: 33.9 g/dL (ref 30.0–36.0)
MCV: 91.6 fL (ref 78.0–100.0)
MPV: 9.7 fL (ref 8.6–12.4)
Platelets: 271 10*3/uL (ref 150–400)
RBC: 3.83 MIL/uL — ABNORMAL LOW (ref 4.22–5.81)
RDW: 13.5 % (ref 11.5–15.5)
WBC: 3.7 10*3/uL — AB (ref 4.0–10.5)

## 2015-06-15 NOTE — Progress Notes (Signed)
Subjective:    Patient ID: [name], male    DOB: 17-Nov-1940, 74 y.o.   MRN: [id number]  HPI Presents today with c/o of black stools x 2 weeks.He says he has not eaten any turnip greens.  He says he feels something "sticking him" in his rectum. He says his stool was black this morning.  His appetite is good. He has maintained his weight.  He is having at least one stool a day. There is no abdominal pain. Patient takes iron daily OTC. (Did not bring medications). Underwent an EGD in June for hematemesis which reveal antral gastritis with small white plaque . The biopsy revealed gastritis with intestinal metaplasia.    CBC    Component Value Date/Time   WBC 4.2 01/26/2015 0905   RBC 3.77* 01/26/2015 0905   HGB 12.4* 04/20/2015 1010   HCT 35.7* 04/20/2015 1010   PLT 195 01/26/2015 0905   MCV 89.1 01/26/2015 0905   MCH 30.8 01/26/2015 0905   MCHC 34.5 01/26/2015 0905   RDW 13.7 01/26/2015 0905   LYMPHSABS 1.9 12/22/2014 1013   MONOABS 0.4 12/22/2014 1013   EOSABS 0.1 12/22/2014 1013   BASOSABS 0.0 12/22/2014 1013       12/24/2014 EGD:    Indications:  Patient is 74 year old African-American male who experienced hematemesis 3 days ago. He was seen in the office 2 days ago and his hemoglobin is 11.6. He has history of GERD. He also has history of peptic ulcer disease. He states he takes naproxen on as-needed basis.                                                                                                              Impression: Small sliding hiatal hernia without evidence of erosive esophagitis ring or stricture formation. Antral gastritis with small whitish plaque. Biopsy taken. 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   04/23/2013 Colonoscopy: Hx of colonic adenoma:  Prep excellent. Three small polyps ablated via cold biopsy and submitted together. One was located at ascending colon and others 2 at hepatic flexure. Normal rectal  mucosa. Small hemorrhoids above and below the dentate line.  Biopsy results reviewed with the patient's son [name]. 3 small polyps removed and these are tubular adenomas. Patient has had adenomas in the past as well Reports to PCP Next colonoscopy in 5 years Review of Systems  Review of Systems Past Medical History  Diagnosis Date  . Hyperlipidemia Since 1995    takes Simvastatin daily  . Herpes   . Hypertension Since 1995    takes Metoprolol and Ramipril daily  . Sleep apnea     doesn't use CPAP  . Pneumonia     hx of;as a child  . Arthritis     fingers  . Chronic back pain   . GERD (gastroesophageal reflux disease)     takes Dexilant and Omeprazole daily  . History of gastric ulcer 25+yrs ago  . History of colon polyps   . Urinary urgency     takes Flomax  daily  . Urinary frequency   . Diabetes mellitus Since 1995    Takes Glucovance and Onglyza daily  . Diabetes mellitus without complication ([place])   . Hypertension     Past Surgical History  Procedure Laterality Date  . Cervical spine surgery    . Rotator cuff repair      Right  . Wrist surgery      Left  . Hand surgery      Right  . Penile prosthesis implant    . Back surgery  1962    Lumbar  . Bilateral cataract surgery    . Cardiac catheterization  02/16/10  . Colonoscopy    . Esophagogastroduodenoscopy (egd) with esophageal dilation    . Lumbar laminectomy/decompression microdiscectomy N/A 08/29/2012    Procedure: LUMBAR LAMINECTOMY/DECOMPRESSION MICRODISCECTOMY 1 LEVEL;  Surgeon: Floyce Stakes, MD;  Location: [place] [facility];  Service: Neurosurgery;  Laterality: N/A;  Lumbar four-five laminectomies  . Colonoscopy N/A 04/23/2013    Procedure: COLONOSCOPY;  Surgeon: Rogene Houston, MD;  [facility]: [facility];  Service: Endoscopy;  Laterality: N/A;  1030  . Back surgery    . Neck surgery    . Shoulder surgery    . Wrist surgery    . Esophagogastroduodenoscopy N/A 12/24/2014    Procedure:  ESOPHAGOGASTRODUODENOSCOPY (EGD);  Surgeon: Rogene Houston, MD;  Location: [facility];  Service: Endoscopy;  Laterality: N/A;  2:45    Allergies  Allergen Reactions  . Aspirin Other (See Comments)    Higher dosage upsets stomach  . Bayer Aspirin [Aspirin]     Stomach "flares up"    Current Outpatient Prescriptions on File Prior to Visit  Medication Sig Dispense Refill  . acyclovir (ZOVIRAX) 200 MG capsule Take 200 mg by mouth as needed.     Marland Kitchen aspirin 81 MG EC tablet Take 81 mg by mouth daily.      Marland Kitchen glyBURIDE-metformin (GLUCOVANCE) 5-500 MG per tablet Take 2 tablets by mouth 2 (two) times daily with a meal.     . LANTUS SOLOSTAR 100 UNIT/ML SOPN Inject 12 Units into the skin at bedtime.    . metoprolol (TOPROL-XL) 50 MG 24 hr tablet Take 50 mg by mouth daily.      . Multiple Vitamins-Minerals (MULTIVITAMIN ADULTS 50+ PO) Take by mouth.    Marland Kitchen omeprazole (PRILOSEC) 40 MG capsule Take 40 mg by mouth daily.    Marland Kitchen oxyCODONE (OXY IR/ROXICODONE) 5 MG immediate release tablet Take 5 mg by mouth 3 (three) times daily as needed for moderate pain. For pain    . ramipril (ALTACE) 10 MG capsule Take 10 mg by mouth daily.     . simvastatin (ZOCOR) 20 MG tablet Take 20 mg by mouth daily.    . tamsulosin (FLOMAX) 0.4 MG CAPS capsule Take 0.4 mg by mouth daily.    . [DISCONTINUED] bismuth-metronidazole-tetracycline (PLYERA) 140-125-125 MG per capsule Take 3 capsules by mouth 4 (four) times daily -  before meals and at bedtime.     No current facility-administered medications on file prior to visit.        Objective:   Physical Exam Blood pressure 136/72, pulse 84, temperature 98.4 F (36.9 C), height 5\' 10"  (1.778 m), weight 158 lb 14.4 oz (72.077 kg). Alert and oriented. Skin warm and dry. Oral mucosa is moist.   . Sclera anicteric, conjunctivae is pink. Thyroid not enlarged. No cervical lymphadenopathy. Lungs clear. Heart regular rate and rhythm.  Abdomen is soft. Bowel sounds are  positive. No  hepatomegaly. No abdominal masses felt. No tenderness.  No edema to lower extremities.  Stool brown and guaiac negative.  Lot [id number] Ex 9/17     Assessment & Plan:  Melena. Suspect patient stools are dark from the iron. I advised him of this. I sent 3 stool cards home with him. Advised I needed specimens on 3 different days. CBC and CMET today.  OV pending.

## 2015-06-15 NOTE — Patient Instructions (Signed)
CBC, CMet today. Stool cards x 3 home with patient. He was instructed to bring back to office to me

## 2015-06-17 ENCOUNTER — Telehealth (INDEPENDENT_AMBULATORY_CARE_PROVIDER_SITE_OTHER): Payer: Self-pay | Admitting: *Deleted

## 2015-06-17 NOTE — Telephone Encounter (Signed)
Patient stopped by office with additional medications he takes: One a day multi vit daily and Iron 65 mg daily

## 2015-06-20 NOTE — Telephone Encounter (Signed)
Noted  

## 2015-09-30 ENCOUNTER — Emergency Department (HOSPITAL_COMMUNITY): Payer: Medicare HMO

## 2015-09-30 ENCOUNTER — Emergency Department (HOSPITAL_COMMUNITY)
Admission: EM | Admit: 2015-09-30 | Discharge: 2015-09-30 | Disposition: A | Discharge status: Home / Self Care | Payer: Medicare HMO | Attending: Emergency Medicine | Admitting: Emergency Medicine

## 2015-09-30 ENCOUNTER — Encounter (HOSPITAL_COMMUNITY): Payer: Self-pay

## 2015-09-30 DIAGNOSIS — Z7982 Long term (current) use of aspirin: Secondary | ICD-10-CM | POA: Diagnosis not present

## 2015-09-30 DIAGNOSIS — Z79899 Other long term (current) drug therapy: Secondary | ICD-10-CM | POA: Insufficient documentation

## 2015-09-30 DIAGNOSIS — K2100 Gastro-esophageal reflux disease with esophagitis, without bleeding: Secondary | ICD-10-CM

## 2015-09-30 DIAGNOSIS — I10 Essential (primary) hypertension: Secondary | ICD-10-CM | POA: Insufficient documentation

## 2015-09-30 DIAGNOSIS — Z794 Long term (current) use of insulin: Secondary | ICD-10-CM | POA: Insufficient documentation

## 2015-09-30 DIAGNOSIS — E785 Hyperlipidemia, unspecified: Secondary | ICD-10-CM | POA: Insufficient documentation

## 2015-09-30 DIAGNOSIS — E119 Type 2 diabetes mellitus without complications: Secondary | ICD-10-CM | POA: Insufficient documentation

## 2015-09-30 DIAGNOSIS — R072 Precordial pain: Secondary | ICD-10-CM | POA: Diagnosis present

## 2015-09-30 LAB — CBC WITH DIFFERENTIAL/PLATELET
BASOS ABS: 0 10*3/uL (ref 0.0–0.1)
Basophils Relative: 1 %
Eosinophils Absolute: 0.1 10*3/uL (ref 0.0–0.7)
Eosinophils Relative: 2 %
HEMATOCRIT: 34.3 % — AB (ref 39.0–52.0)
HEMOGLOBIN: 11.9 g/dL — AB (ref 13.0–17.0)
LYMPHS PCT: 52 %
Lymphs Abs: 1.8 10*3/uL (ref 0.7–4.0)
MCH: 31.8 pg (ref 26.0–34.0)
MCHC: 34.7 g/dL (ref 30.0–36.0)
MCV: 91.7 fL (ref 78.0–100.0)
MONO ABS: 0.2 10*3/uL (ref 0.1–1.0)
Monocytes Relative: 6 %
NEUTROS ABS: 1.3 10*3/uL — AB (ref 1.7–7.7)
NEUTROS PCT: 39 %
Platelets: 149 10*3/uL — ABNORMAL LOW (ref 150–400)
RBC: 3.74 MIL/uL — ABNORMAL LOW (ref 4.22–5.81)
RDW: 12.6 % (ref 11.5–15.5)
WBC: 3.4 10*3/uL — ABNORMAL LOW (ref 4.0–10.5)

## 2015-09-30 LAB — TROPONIN I

## 2015-09-30 LAB — COMPREHENSIVE METABOLIC PANEL
ALK PHOS: 36 U/L — AB (ref 38–126)
ALT: 14 U/L — AB (ref 17–63)
AST: 21 U/L (ref 15–41)
Albumin: 3.9 g/dL (ref 3.5–5.0)
Anion gap: 4 — ABNORMAL LOW (ref 5–15)
BILIRUBIN TOTAL: 0.4 mg/dL (ref 0.3–1.2)
BUN: 12 mg/dL (ref 6–20)
CALCIUM: 9.1 mg/dL (ref 8.9–10.3)
CO2: 31 mmol/L (ref 22–32)
CREATININE: 1.1 mg/dL (ref 0.61–1.24)
Chloride: 106 mmol/L (ref 101–111)
GFR calc Af Amer: >60 mL/min (ref >60)
GLUCOSE: 64 mg/dL — AB (ref 65–99)
POTASSIUM: 4.3 mmol/L (ref 3.5–5.1)
Sodium: 141 mmol/L (ref 135–145)
TOTAL PROTEIN: 7 g/dL (ref 6.5–8.1)

## 2015-09-30 LAB — I-STAT TROPONIN, ED: Troponin i, poc: 0.01 ng/mL (ref 0.00–0.08)

## 2015-09-30 MED ORDER — GI COCKTAIL ~~LOC~~
30.0000 mL | Freq: Once | ORAL | Status: AC
  Administered 2015-09-30: 30 mL via ORAL
  Filled 2015-09-30: qty 30

## 2015-09-30 NOTE — ED Provider Notes (Signed)
CSN: [id number]     Arrival date & time 09/30/15  1205 History   First MD Initiated Contact with Patient 09/30/15 1300     Chief Complaint  Patient presents with  . Chest Pain     (Consider location/radiation/quality/duration/timing/severity/associated sxs/prior Treatment) Patient is a 75 y.o. male presenting with chest pain. The history is provided by the patient (Patient complains of a burning in the lower part of his chest area xiphoid).  Chest Pain Pain location:  Substernal area Pain quality: aching   Pain radiates to:  Does not radiate Pain radiates to the back: no   Pain severity:  Mild Onset quality:  Sudden Timing:  Intermittent Progression:  Waxing and waning Associated symptoms: no abdominal pain, no back pain, no cough, no fatigue and no headache     Past Medical History  Diagnosis Date  . Hyperlipidemia Since 1995    takes Simvastatin daily  . Herpes   . Hypertension Since 1995    takes Metoprolol and Ramipril daily  . Sleep apnea     doesn't use CPAP  . Pneumonia     hx of;as a child  . Arthritis     fingers  . Chronic back pain   . GERD (gastroesophageal reflux disease)     takes Dexilant and Omeprazole daily  . History of gastric ulcer 25+yrs ago  . History of colon polyps   . Urinary urgency     takes Flomax daily  . Urinary frequency   . Diabetes mellitus Since 1995    Takes Glucovance and Onglyza daily  . Diabetes mellitus without complication ([place])   . Hypertension    Past Surgical History  Procedure Laterality Date  . Cervical spine surgery    . Rotator cuff repair      Right  . Wrist surgery      Left  . Hand surgery      Right  . Penile prosthesis implant    . Back surgery  1962    Lumbar  . Bilateral cataract surgery    . Cardiac catheterization  02/16/10  . Colonoscopy    . Esophagogastroduodenoscopy (egd) with esophageal dilation    . Lumbar laminectomy/decompression microdiscectomy N/A 08/29/2012    Procedure: LUMBAR  LAMINECTOMY/DECOMPRESSION MICRODISCECTOMY 1 LEVEL;  Surgeon: Floyce Stakes, MD;  Location: [facility];  Service: Neurosurgery;  Laterality: N/A;  Lumbar four-five laminectomies  . Colonoscopy N/A 04/23/2013    Procedure: COLONOSCOPY;  Surgeon: Rogene Houston, MD;  [facility]: [facility];  Service: Endoscopy;  Laterality: N/A;  1030  . Back surgery    . Neck surgery    . Shoulder surgery    . Wrist surgery    . Esophagogastroduodenoscopy N/A 12/24/2014    Procedure: ESOPHAGOGASTRODUODENOSCOPY (EGD);  Surgeon: Rogene Houston, MD;  Location: [facility];  Service: Endoscopy;  Laterality: N/A;  2:45   Family History  Problem Relation Age of Onset  . Heart attack Father 34    MI  . Hypertension Father   . Heart attack Brother 35    MI  . Heart attack Paternal Uncle   . Diabetes Mother    Social History  Substance Use Topics  . Smoking status: Never Smoker   . Smokeless tobacco: Never Used  . Alcohol Use: Yes     Comment: occasionally beer    Review of Systems  Constitutional: Negative for appetite change and fatigue.  HENT: Negative for congestion, ear discharge and sinus pressure.  Eyes: Negative for discharge.  Respiratory: Negative for cough.   Cardiovascular: Positive for chest pain.  Gastrointestinal: Negative for abdominal pain and diarrhea.  Genitourinary: Negative for frequency and hematuria.  Musculoskeletal: Negative for back pain.  Skin: Negative for rash.  Neurological: Negative for seizures and headaches.  Psychiatric/Behavioral: Negative for hallucinations.      Allergies  Aspirin and Bayer aspirin  Home Medications   Prior to Admission medications   Medication Sig Start Date End Date Taking? Authorizing Provider  acyclovir (ZOVIRAX) 200 MG capsule Take 200 mg by mouth as needed.  01/07/14  Yes Historical Provider, MD  aspirin 81 MG EC tablet Take 81 mg by mouth daily.     Yes Historical Provider, MD  glyBURIDE-metformin (GLUCOVANCE) 5-500 MG per  tablet Take 2 tablets by mouth 2 (two) times daily with a meal.    Yes Historical Provider, MD  Iron-Vit C-Vit B12-Folic Acid (IRON 123XX123 PLUS PO) Take by mouth.   Yes Historical Provider, MD  LANTUS SOLOSTAR 100 UNIT/ML SOPN Inject 12 Units into the skin at bedtime. 04/15/13  Yes Historical Provider, MD  metoprolol (TOPROL-XL) 50 MG 24 hr tablet Take 50 mg by mouth daily.     Yes Historical Provider, MD  Multiple Vitamins-Minerals (MULTIVITAMIN ADULTS 50+ PO) Take by mouth.   Yes Historical Provider, MD  omeprazole (PRILOSEC) 40 MG capsule Take 40 mg by mouth daily.   Yes Historical Provider, MD  oxyCODONE (OXY IR/ROXICODONE) 5 MG immediate release tablet Take 5 mg by mouth 3 (three) times daily as needed for moderate pain. For pain 06/16/13  Yes Historical Provider, MD  ramipril (ALTACE) 10 MG capsule Take 10 mg by mouth daily.  02/05/14  Yes Historical Provider, MD  simvastatin (ZOCOR) 20 MG tablet Take 20 mg by mouth daily. 04/15/13  Yes Historical Provider, MD  tamsulosin (FLOMAX) 0.4 MG CAPS capsule Take 0.4 mg by mouth daily.    Historical Provider, MD   BP 154/79 mmHg  Pulse 57  Temp(Src) 97.9 F (36.6 C) (Oral)  Resp 18  Ht 5\' 10"  (1.778 m)  Wt 190 lb (86.183 kg)  BMI 27.26 kg/m2  SpO2 100% Physical Exam  Constitutional: He is oriented to person, place, and time. He appears well-developed.  HENT:  Head: Normocephalic.  Eyes: Conjunctivae and EOM are normal. No scleral icterus.  Neck: Neck supple. No thyromegaly present.  Cardiovascular: Normal rate and regular rhythm.  Exam reveals no gallop and no friction rub.   No murmur heard. Pulmonary/Chest: No stridor. He has no wheezes. He has no rales. He exhibits no tenderness.  Abdominal: He exhibits no distension. There is tenderness. There is no rebound.  Mild tender epigastric area  Musculoskeletal: Normal range of motion. He exhibits no edema.  Lymphadenopathy:    He has no cervical adenopathy.  Neurological: He is oriented to  person, place, and time. He exhibits normal muscle tone. Coordination normal.  Skin: No rash noted. No erythema.  Psychiatric: He has a normal mood and affect. His behavior is normal.    ED Course  Procedures (including critical care time) Labs Review Labs Reviewed  CBC WITH DIFFERENTIAL/PLATELET - Abnormal; Notable for the following:    WBC 3.4 (*)    RBC 3.74 (*)    Hemoglobin 11.9 (*)    HCT 34.3 (*)    Platelets 149 (*)    Neutro Abs 1.3 (*)    All other components within normal limits  COMPREHENSIVE METABOLIC PANEL - Abnormal; Notable for the following:  Glucose, Bld 64 (*)    ALT 14 (*)    Alkaline Phosphatase 36 (*)    Anion gap 4 (*)    All other components within normal limits  TROPONIN I  Randolm Idol, ED    Imaging Review Dg Chest 2 View  09/30/2015  CLINICAL DATA:  Chest pain for 1 day EXAM: CHEST  2 VIEW COMPARISON:  April 11, 2014 FINDINGS: Lungs are clear. Heart size and pulmonary vascularity are normal. No adenopathy. No pneumothorax. No bone lesions. IMPRESSION: No edema or consolidation. Electronically Signed   By: Lowella Grip III [name]   On: 09/30/2015 12:53   I have personally reviewed and evaluated these images and lab results as part of my medical decision-making.   EKG Interpretation   Date/Time:  Friday September 30 2015 12:12:08 EDT Ventricular Rate:  60 PR Interval:  162 QRS Duration: 78 QT Interval:  370 QTC Calculation: 370 R Axis:   4 Text Interpretation:  Sinus rhythm with Premature atrial complexes  Otherwise normal ECG Confirmed by Maridee Slape  MD, Cherelle Midkiff [phone]) on 09/30/2015  1:07:54 PM      MDM   Final diagnoses:  Gastroesophageal reflux disease with esophagitis    Patient improved with the GI cocktail. He had 2 normal troponins and EKG unremarkable. Suspect GERD will increase Prilosec to twice a day.    Milton Ferguson, MD 09/30/15 [phone]

## 2015-09-30 NOTE — ED Notes (Signed)
Pt reports that after eating fish last night, his mid to left chest started hurting. Describes that he cant take a deep breath.

## 2015-09-30 NOTE — Discharge Instructions (Signed)
Increase your prilosec to twice a day.  Follow up with your md next week.

## 2015-09-30 NOTE — ED Notes (Signed)
MD at bedside. 

## 2015-10-06 ENCOUNTER — Encounter (INDEPENDENT_AMBULATORY_CARE_PROVIDER_SITE_OTHER): Payer: Self-pay | Admitting: Internal Medicine

## 2015-10-06 ENCOUNTER — Ambulatory Visit (INDEPENDENT_AMBULATORY_CARE_PROVIDER_SITE_OTHER): Payer: Medicare HMO | Admitting: Internal Medicine

## 2015-10-06 VITALS — BP 140/70 | HR 64 | Temp 97.0°F | Ht 70.0 in | Wt 160.7 lb

## 2015-10-06 DIAGNOSIS — K219 Gastro-esophageal reflux disease without esophagitis: Secondary | ICD-10-CM

## 2015-10-06 NOTE — Patient Instructions (Signed)
Prilosec 40mg  bid x 5 days and then once a day. OV in 6 months.

## 2015-10-06 NOTE — Progress Notes (Signed)
Subjective:    Patient ID: [name], male    DOB: [birth year], 75 y.o.   MRN: [id number]  HPI Presents today with c/o indigestion. Seen in the ED on 09/30/2015. He had pain in his chest. Troponins were normal as well as EKG.   He denies any vomiting. There was no diarrhea. He had belching which makes him feel better.  Today he says he feels better. When he belches, he feels much better. Appetite is good. No weight loss. He has a BM 1 a day and sometimes every other day. . No melena or BRRB.  CXR on 09/30/2015 was normal.  His Prilosec was increased to BID.    CBC    Component Value Date/Time   WBC 3.4* 09/30/2015 1324   RBC 3.74* 09/30/2015 1324   HGB 11.9* 09/30/2015 1324   HCT 34.3* 09/30/2015 1324   PLT 149* 09/30/2015 1324   MCV 91.7 09/30/2015 1324   MCH 31.8 09/30/2015 1324   MCHC 34.7 09/30/2015 1324   RDW 12.6 09/30/2015 1324   LYMPHSABS 1.8 09/30/2015 1324   MONOABS 0.2 09/30/2015 1324   EOSABS 0.1 09/30/2015 1324   BASOSABS 0.0 09/30/2015 1324     12/24/2014 EGD:   Indications: Patient is 75 year old African-American male who experienced hematemesis 3 days ago. He was seen in the office 2 days ago and his hemoglobin is 11.6. He has history of GERD. He also has history of peptic ulcer disease. He states he takes naproxen on as-needed basis. Impression: Small sliding hiatal hernia without evidence of erosive esophagitis ring or stricture formation. Antral gastritis with small whitish plaque. Biopsy taken. 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   04/23/2013 Colonoscopy: Hx of colonic adenoma:  Prep excellent. Three small polyps ablated via cold biopsy and submitted together. One was located at ascending colon and others 2 at hepatic flexure. Normal rectal mucosa. Small hemorrhoids above and below the dentate line.  Biopsy  results reviewed with the patient's son [name]. 3 small polyps removed and these are tubular adenomas. Patient has had adenomas in the past as well Reports to PCP Next colonoscopy in 5 years  Review of Systems     Past Medical History  Diagnosis Date  . Hyperlipidemia Since 1995    takes Simvastatin daily  . Herpes   . Hypertension Since 1995    takes Metoprolol and Ramipril daily  . Sleep apnea     doesn't use CPAP  . Pneumonia     hx of;as a child  . Arthritis     fingers  . Chronic back pain   . GERD (gastroesophageal reflux disease)     takes Dexilant and Omeprazole daily  . History of gastric ulcer 25+yrs ago  . History of colon polyps   . Urinary urgency     takes Flomax daily  . Urinary frequency   . Diabetes mellitus Since 1995    Takes Glucovance and Onglyza daily  . Diabetes mellitus without complication ([place])   . Hypertension     Past Surgical History  Procedure Laterality Date  . Cervical spine surgery    . Rotator cuff repair      Right  . Wrist surgery      Left  . Hand surgery      Right  . Penile prosthesis implant    . Back surgery  1962    Lumbar  . Bilateral cataract surgery    . Cardiac catheterization  02/16/10  . Colonoscopy    .  Esophagogastroduodenoscopy (egd) with esophageal dilation    . Lumbar laminectomy/decompression microdiscectomy N/A 08/29/2012    Procedure: LUMBAR LAMINECTOMY/DECOMPRESSION MICRODISCECTOMY 1 LEVEL;  Surgeon: Floyce Stakes, MD;  Location: [place] [facility];  Service: Neurosurgery;  Laterality: N/A;  Lumbar four-five laminectomies  . Colonoscopy N/A 04/23/2013    Procedure: COLONOSCOPY;  Surgeon: Rogene Houston, MD;  [facility];  Service: Endoscopy;  Laterality: N/A;  1030  . Back surgery    . Neck surgery    . Shoulder surgery    . Wrist surgery    . Esophagogastroduodenoscopy N/A 12/24/2014    Procedure: ESOPHAGOGASTRODUODENOSCOPY (EGD);  Surgeon: Rogene Houston, MD;  [facility];   Service: Endoscopy;  Laterality: N/A;  2:45    Allergies  Allergen Reactions  . Aspirin Other (See Comments)    Higher dosage upsets stomach  . Bayer Aspirin [Aspirin]     Stomach "flares up"    Current Outpatient Prescriptions on File Prior to Visit  Medication Sig Dispense Refill  . aspirin 81 MG EC tablet Take 81 mg by mouth daily.      Marland Kitchen glyBURIDE-metformin (GLUCOVANCE) 5-500 MG per tablet Take 2 tablets by mouth 2 (two) times daily with a meal.     . Iron-Vit C-Vit B12-Folic Acid (IRON 123XX123 PLUS PO) Take by mouth.    Marland Kitchen LANTUS SOLOSTAR 100 UNIT/ML SOPN Inject 12 Units into the skin at bedtime.    . metoprolol (TOPROL-XL) 50 MG 24 hr tablet Take 50 mg by mouth daily.      . Multiple Vitamins-Minerals (MULTIVITAMIN ADULTS 50+ PO) Take by mouth.    Marland Kitchen omeprazole (PRILOSEC) 40 MG capsule Take 40 mg by mouth daily.    Marland Kitchen oxyCODONE (OXY IR/ROXICODONE) 5 MG immediate release tablet Take 5 mg by mouth 3 (three) times daily as needed for moderate pain. For pain    . ramipril (ALTACE) 10 MG capsule Take 10 mg by mouth daily.     . simvastatin (ZOCOR) 20 MG tablet Take 20 mg by mouth daily.    . tamsulosin (FLOMAX) 0.4 MG CAPS capsule Take 0.4 mg by mouth daily.    Marland Kitchen acyclovir (ZOVIRAX) 200 MG capsule Take 200 mg by mouth as needed. Reported on 10/06/2015    . [DISCONTINUED] bismuth-metronidazole-tetracycline (PLYERA) 140-125-125 MG per capsule Take 3 capsules by mouth 4 (four) times daily -  before meals and at bedtime.     No current facility-administered medications on file prior to visit.     Objective:   Physical Exam Blood pressure 140/70, pulse 64, temperature 97 F (36.1 C), height 5\' 10"  (1.778 m), weight 160 lb 11.2 oz (72.893 kg). Alert and oriented. Skin warm and dry. Oral mucosa is moist.   . Sclera anicteric, conjunctivae is pink. Thyroid not enlarged. No cervical lymphadenopathy. Lungs clear. Heart regular rate and rhythm.  Abdomen is soft. Bowel sounds are positive. No  hepatomegaly. No abdominal masses felt. No tenderness.  No edema to lower extremities.         Assessment & Plan:  GERD. He is much better now since increasing Omeprazole to BID. Continue Prilosec bid  x 5 more days. OV in 6 months.

## 2016-04-09 ENCOUNTER — Encounter (INDEPENDENT_AMBULATORY_CARE_PROVIDER_SITE_OTHER): Payer: Self-pay | Admitting: Internal Medicine

## 2016-04-09 ENCOUNTER — Other Ambulatory Visit (INDEPENDENT_AMBULATORY_CARE_PROVIDER_SITE_OTHER): Payer: Self-pay | Admitting: Internal Medicine

## 2016-04-09 ENCOUNTER — Ambulatory Visit (INDEPENDENT_AMBULATORY_CARE_PROVIDER_SITE_OTHER): Payer: Medicare HMO | Admitting: Internal Medicine

## 2016-04-09 VITALS — BP 120/80 | HR 66 | Temp 98.2°F | Resp 18 | Ht 70.0 in | Wt 155.8 lb

## 2016-04-09 DIAGNOSIS — K838 Other specified diseases of biliary tract: Secondary | ICD-10-CM

## 2016-04-09 LAB — CBC WITH DIFFERENTIAL/PLATELET
BASOS ABS: 0 {cells}/uL (ref 0–200)
Basophils Relative: 0 %
EOS ABS: 50 {cells}/uL (ref 15–500)
Eosinophils Relative: 1 %
HEMATOCRIT: 35.4 % — AB (ref 38.5–50.0)
HEMOGLOBIN: 12 g/dL — AB (ref 13.2–17.1)
LYMPHS ABS: 1800 {cells}/uL (ref 850–3900)
Lymphocytes Relative: 36 %
MCH: 31.1 pg (ref 27.0–33.0)
MCHC: 33.9 g/dL (ref 32.0–36.0)
MCV: 91.7 fL (ref 80.0–100.0)
MONO ABS: 400 {cells}/uL (ref 200–950)
MPV: 10.2 fL (ref 7.5–12.5)
Monocytes Relative: 8 %
NEUTROS ABS: 2750 {cells}/uL (ref 1500–7800)
NEUTROS PCT: 55 %
Platelets: 237 10*3/uL (ref 140–400)
RBC: 3.86 MIL/uL — ABNORMAL LOW (ref 4.20–5.80)
RDW: 13.2 % (ref 11.0–15.0)
WBC: 5 10*3/uL (ref 3.8–10.8)

## 2016-04-09 LAB — HEPATIC FUNCTION PANEL
ALBUMIN: 3.7 g/dL (ref 3.6–5.1)
ALK PHOS: 45 U/L (ref 40–115)
ALT: 8 U/L — AB (ref 9–46)
AST: 12 U/L (ref 10–35)
BILIRUBIN TOTAL: 0.3 mg/dL (ref 0.2–1.2)
Bilirubin, Direct: 0.1 mg/dL (ref <=0.2)
Indirect Bilirubin: 0.2 mg/dL (ref 0.2–1.2)
Total Protein: 6.7 g/dL (ref 6.1–8.1)

## 2016-04-09 NOTE — Progress Notes (Signed)
Subjective:    Patient ID: [name], male    DOB: 1940/12/02, 75 y.o.   MRN: [id number]  HPI Here today for f/u of his chronic GERD. He tells me he is doing good. He says he has a head and chest cold. He has coughed up some green phlegm. Appetite has been good. He has lost about 4 pounds since his last. Presently taking Protoinix 40mg  daily. He tells me the Protonix is helping his acid reflux. He has a BM once a day. No melena or BRRB.  Hx of CBD and his last US abdomen was in 2015 with normal liver enzymes. CBD measured 12.5.   12/24/2014 EGD:   Indications: Patient is 75 year old African-American male who experienced hematemesis 3 days ago. He was seen in the office 2 days ago and his hemoglobin is 11.6. He has history of GERD. He also has history of peptic ulcer disease. He states he takes naproxen on as-needed basis. Impression: Small sliding hiatal hernia without evidence of erosive esophagitis ring or stricture formation. Antral gastritis with small whitish plaque. Biopsy taken. 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   04/23/2013 Colonoscopy: Hx of colonic adenoma:  Prep excellent. Three small polyps ablated via cold biopsy and submitted together. One was located at ascending colon and others 2 at hepatic flexure. Normal rectal mucosa. Small hemorrhoids above and below the dentate line.  Biopsy results reviewed with the patient's son [name]. 3 small polyps removed and these are tubular adenomas. Patient has had adenomas in the past as well Reports to PCP Next colonoscopy in 5 years  Review of Systems  Review of Systems Past Medical History:  Diagnosis Date  . Arthritis    fingers  . Chronic back pain   . Diabetes mellitus Since 1995   Takes Glucovance and Onglyza daily  . Diabetes mellitus without complication ([place])   . GERD  (gastroesophageal reflux disease)    takes Dexilant and Omeprazole daily  . Herpes   . History of colon polyps   . History of gastric ulcer 25+yrs ago  . Hyperlipidemia Since 1995   takes Simvastatin daily  . Hypertension Since 1995   takes Metoprolol and Ramipril daily  . Hypertension   . Pneumonia    hx of;as a child  . Sleep apnea    doesn't use CPAP  . Urinary frequency   . Urinary urgency    takes Flomax daily    Past Surgical History:  Procedure Laterality Date  . BACK SURGERY  1962   Lumbar  . BACK SURGERY    . bilateral cataract surgery    . CARDIAC CATHETERIZATION  02/16/10  . CERVICAL SPINE SURGERY    . COLONOSCOPY    . COLONOSCOPY N/A 04/23/2013   Procedure: COLONOSCOPY;  Surgeon: Rogene Houston, MD;  [facility]: [facility];  Service: Endoscopy;  Laterality: N/A;  1030  . ESOPHAGOGASTRODUODENOSCOPY N/A 12/24/2014   Procedure: ESOPHAGOGASTRODUODENOSCOPY (EGD);  Surgeon: Rogene Houston, MD;  Location: [facility];  Service: Endoscopy;  Laterality: N/A;  2:45  . ESOPHAGOGASTRODUODENOSCOPY (EGD) WITH ESOPHAGEAL DILATION    . HAND SURGERY     Right  . LUMBAR LAMINECTOMY/DECOMPRESSION MICRODISCECTOMY N/A 08/29/2012   Procedure: LUMBAR LAMINECTOMY/DECOMPRESSION MICRODISCECTOMY 1 LEVEL;  Surgeon: Floyce Stakes, MD;  Location: [facility];  Service: Neurosurgery;  Laterality: N/A;  Lumbar four-five laminectomies  . NECK SURGERY    . PENILE PROSTHESIS IMPLANT    . ROTATOR CUFF REPAIR  Right  . SHOULDER SURGERY    . WRIST SURGERY     Left  . WRIST SURGERY      Allergies  Allergen Reactions  . Aspirin Other (See Comments)    Higher dosage upsets stomach  . Bayer Aspirin [Aspirin]     Stomach "flares up"    Current Outpatient Prescriptions on File Prior to Visit  Medication Sig Dispense Refill  . acyclovir (ZOVIRAX) 200 MG capsule Take 200 mg by mouth as needed. Reported on 10/06/2015    . aspirin 81 MG EC tablet Take 81 mg by mouth daily.      Marland Kitchen  glyBURIDE-metformin (GLUCOVANCE) 5-500 MG per tablet Take 2 tablets by mouth 2 (two) times daily with a meal.     . Iron-Vit C-Vit B12-Folic Acid (IRON 123XX123 PLUS PO) Take by mouth.    Marland Kitchen LANTUS SOLOSTAR 100 UNIT/ML SOPN Inject 12 Units into the skin at bedtime.    . metoprolol (TOPROL-XL) 50 MG 24 hr tablet Take 50 mg by mouth daily.      Marland Kitchen omeprazole (PRILOSEC) 40 MG capsule Take 40 mg by mouth 2 (two) times daily.     Marland Kitchen oxyCODONE (OXY IR/ROXICODONE) 5 MG immediate release tablet Take 5 mg by mouth 3 (three) times daily as needed for moderate pain. For pain    . ramipril (ALTACE) 10 MG capsule Take 10 mg by mouth daily.     . simvastatin (ZOCOR) 20 MG tablet Take 20 mg by mouth daily.    . Multiple Vitamins-Minerals (MULTIVITAMIN ADULTS 50+ PO) Take by mouth.    . tamsulosin (FLOMAX) 0.4 MG CAPS capsule Take 0.4 mg by mouth daily.    . [DISCONTINUED] bismuth-metronidazole-tetracycline (PLYERA) 140-125-125 MG per capsule Take 3 capsules by mouth 4 (four) times daily -  before meals and at bedtime.     No current facility-administered medications on file prior to visit.        Objective:   Physical Exam Blood pressure 120/80, pulse 66, temperature 98.2 F (36.8 C), temperature source Oral, resp. rate 18, height 5\' 10"  (1.778 m), weight 155 lb 12.8 oz (70.7 kg). Alert and oriented. Skin warm and dry. Oral mucosa is moist.   . Sclera anicteric, conjunctivae is pink. Thyroid not enlarged. No cervical lymphadenopathy. Lungs clear. Heart regular rate and rhythm.  Abdomen is soft. Bowel sounds are positive. No hepatomegaly. No abdominal masses felt. No tenderness.  No edema to lower extremities. Patient is alert and oriented.        Assessment & Plan:  GERD. Controlled at this time. No GI complaints. Hx of dilated CBD: will get a CBC and hepatic function.  OV in 1 year.

## 2016-04-09 NOTE — Patient Instructions (Signed)
Continue the Omeprazole once u have taken the Protonix x 30 days. OV in 1 year.

## 2016-08-05 ENCOUNTER — Emergency Department (HOSPITAL_COMMUNITY)
Admission: EM | Admit: 2016-08-05 | Discharge: 2016-08-05 | Disposition: A | Discharge status: Home / Self Care | Payer: Medicare HMO | Attending: Emergency Medicine | Admitting: Emergency Medicine

## 2016-08-05 ENCOUNTER — Encounter (HOSPITAL_COMMUNITY): Payer: Self-pay | Admitting: Emergency Medicine

## 2016-08-05 DIAGNOSIS — Z7982 Long term (current) use of aspirin: Secondary | ICD-10-CM | POA: Diagnosis not present

## 2016-08-05 DIAGNOSIS — Y9389 Activity, other specified: Secondary | ICD-10-CM | POA: Insufficient documentation

## 2016-08-05 DIAGNOSIS — Y929 Unspecified place or not applicable: Secondary | ICD-10-CM | POA: Diagnosis not present

## 2016-08-05 DIAGNOSIS — S0990XA Unspecified injury of head, initial encounter: Secondary | ICD-10-CM | POA: Diagnosis present

## 2016-08-05 DIAGNOSIS — W1809XA Striking against other object with subsequent fall, initial encounter: Secondary | ICD-10-CM | POA: Insufficient documentation

## 2016-08-05 DIAGNOSIS — F0781 Postconcussional syndrome: Secondary | ICD-10-CM | POA: Insufficient documentation

## 2016-08-05 DIAGNOSIS — Z79899 Other long term (current) drug therapy: Secondary | ICD-10-CM | POA: Diagnosis not present

## 2016-08-05 DIAGNOSIS — Y999 Unspecified external cause status: Secondary | ICD-10-CM | POA: Diagnosis not present

## 2016-08-05 DIAGNOSIS — R51 Headache: Secondary | ICD-10-CM | POA: Diagnosis not present

## 2016-08-05 DIAGNOSIS — I10 Essential (primary) hypertension: Secondary | ICD-10-CM | POA: Insufficient documentation

## 2016-08-05 DIAGNOSIS — E119 Type 2 diabetes mellitus without complications: Secondary | ICD-10-CM | POA: Diagnosis not present

## 2016-08-05 LAB — CBG MONITORING, ED: GLUCOSE-CAPILLARY: 143 mg/dL — AB (ref 65–99)

## 2016-08-05 MED ORDER — BUTALBITAL-APAP-CAFFEINE 50-325-40 MG PO TABS: 1.0000 | ORAL_TABLET | Freq: Three times a day (TID) | ORAL | 0 refills | Status: DC | PRN

## 2016-08-05 NOTE — ED Notes (Signed)
ED Provider at bedside. 

## 2016-08-05 NOTE — ED Triage Notes (Signed)
Pt slipped on Jan 18th getting into car and hit right temple area on door. Denies loc. Pt states has had ha since. Denies any vision changes. A/o. nad at this time. No bruising/swelling noted at this time.

## 2016-08-05 NOTE — ED Provider Notes (Signed)
[facility] DEPT Provider Note   CSN: [id number] Arrival date & time: 08/05/16  1548  By signing my name below, I, [name], attest that this documentation has been prepared under the direction and in the presence of Barry Manifold, MD. Electronically Signed: Reola Irwin, ED Scribe. 08/05/16. 4:18 PM.  History   Chief Complaint Chief Complaint  Patient presents with  . Headache  . Fall   The history is provided by the patient. No language interpreter was used.    HPI Comments: [name] is a 76 y.o. male with a PMHx of DM and HTN, who presents to the Emergency Department complaining of persistent, bilateral temporal headache s/p ground-level, mechanical fall beginning approximately 10 days ago. Pt reports that on 07/26/16 he slipped while getting into his car, causing him to strike the right side of his head on the metal frame of his car. No LOC or other reported areas of pain/injury. Pt notes that his headache radiates forward into the frontal region. He reports intermittent blurry vision secondary to his headache as well. No treatments for his headache were tried prior to coming into the ED. He denies nausea, vomiting, neck pain, or any other associated symptoms.   Past Medical History:  Diagnosis Date  . Arthritis    fingers  . Chronic back pain   . Diabetes mellitus Since 1995   Takes Glucovance and Onglyza daily  . Diabetes mellitus without complication ([place])   . GERD (gastroesophageal reflux disease)    takes Dexilant and Omeprazole daily  . Herpes   . History of colon polyps   . History of gastric ulcer 25+yrs ago  . Hyperlipidemia Since 1995   takes Simvastatin daily  . Hypertension Since 1995   takes Metoprolol and Ramipril daily  . Hypertension   . Pneumonia    hx of;as a child  . Sleep apnea    doesn't use CPAP  . Urinary frequency   . Urinary urgency    takes Flomax daily   Patient Active Problem List   Diagnosis Date Noted    . 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 GERD (gastroesophageal reflux disease) 09/11/2011  . High cholesterol 09/11/2011  . Bronchitis 09/11/2011  . DM 02/13/2010  . ESSENTIAL HYPERTENSION, BENIGN 02/13/2010  . PRECORDIAL PAIN 02/13/2010   Past Surgical History:  Procedure Laterality Date  . BACK SURGERY  1962   Lumbar  . BACK SURGERY    . bilateral cataract surgery    . CARDIAC CATHETERIZATION  02/16/10  . CERVICAL SPINE SURGERY    . COLONOSCOPY    . COLONOSCOPY N/A 04/23/2013   Procedure: COLONOSCOPY;  Surgeon: Rogene Houston, MD;  [facility];  Service: Endoscopy;  Laterality: N/A;  1030  . ESOPHAGOGASTRODUODENOSCOPY N/A 12/24/2014   Procedure: ESOPHAGOGASTRODUODENOSCOPY (EGD);  Surgeon: Rogene Houston, MD;  [facility];  Service: Endoscopy;  Laterality: N/A;  2:45  . ESOPHAGOGASTRODUODENOSCOPY (EGD) WITH ESOPHAGEAL DILATION    . HAND SURGERY     Right  . LUMBAR LAMINECTOMY/DECOMPRESSION MICRODISCECTOMY N/A 08/29/2012   Procedure: LUMBAR LAMINECTOMY/DECOMPRESSION MICRODISCECTOMY 1 LEVEL;  Surgeon: Floyce Stakes, MD;  Location: [place] [facility];  Service: Neurosurgery;  Laterality: N/A;  Lumbar four-five laminectomies  . NECK SURGERY    . PENILE PROSTHESIS IMPLANT    . ROTATOR CUFF REPAIR     Right  . SHOULDER SURGERY    . WRIST SURGERY     Left  . WRIST SURGERY  Home Medications    Prior to Admission medications   Medication Sig Start Date End Date Taking? Authorizing Provider  acyclovir (ZOVIRAX) 200 MG capsule Take 200 mg by mouth as needed. Reported on 10/06/2015 01/07/14   Historical Provider, MD  aspirin 81 MG EC tablet Take 81 mg by mouth daily.      Historical Provider, MD  glyBURIDE-metformin (GLUCOVANCE) 5-500 MG per tablet Take 2 tablets by mouth 2 (two) times daily with a meal.     Historical Provider, MD  Iron-Vit C-Vit B12-Folic Acid (IRON 123XX123 PLUS PO) Take by mouth.    Historical Provider, MD  LANTUS SOLOSTAR 100 UNIT/ML SOPN Inject  12 Units into the skin at bedtime. 04/15/13   Historical Provider, MD  metoprolol (TOPROL-XL) 50 MG 24 hr tablet Take 50 mg by mouth daily.      Historical Provider, MD  Multiple Vitamins-Minerals (MULTIVITAMIN ADULTS 50+ PO) Take by mouth.    Historical Provider, MD  omeprazole (PRILOSEC) 40 MG capsule Take 40 mg by mouth 2 (two) times daily.     Historical Provider, MD  oxyCODONE (OXY IR/ROXICODONE) 5 MG immediate release tablet Take 5 mg by mouth 3 (three) times daily as needed for moderate pain. For pain 06/16/13   Historical Provider, MD  ramipril (ALTACE) 10 MG capsule Take 10 mg by mouth daily.  02/05/14   Historical Provider, MD  simvastatin (ZOCOR) 20 MG tablet Take 20 mg by mouth daily. 04/15/13   Historical Provider, MD  tamsulosin (FLOMAX) 0.4 MG CAPS capsule Take 0.4 mg by mouth daily.    Historical Provider, MD   Family History Family History  Problem Relation Age of Onset  . Heart attack Father 12    MI  . Hypertension Father   . Heart attack Brother 91    MI  . Diabetes Mother   . Heart attack Paternal Uncle    Social History Social History  Substance Use Topics  . Smoking status: Never Smoker  . Smokeless tobacco: Never Used  . Alcohol use Yes     Comment: occasionally beer   Allergies   Aspirin and Bayer aspirin [aspirin]  Review of Systems Review of Systems  Eyes: Positive for visual disturbance (blurry).  Gastrointestinal: Negative for nausea and vomiting.  Musculoskeletal: Negative for neck pain.  Neurological: Positive for headaches. Negative for syncope.  All other systems reviewed and are negative.  Physical Exam Updated Vital Signs Ht 5\' 10"  (1.778 m)   Wt 159 lb (72.1 kg)   BMI 22.81 kg/m   Physical Exam  Constitutional: He is oriented to person, place, and time. He appears well-developed and well-nourished.  HENT:  Head: Normocephalic.  Right Ear: External ear normal.  Left Ear: External ear normal.  Nose: Nose normal.  No scalp tenderness.    Eyes: Conjunctivae and EOM are normal. Pupils are equal, round, and reactive to light. Right eye exhibits no discharge. Left eye exhibits no discharge.  Neck: Normal range of motion.  Cardiovascular: Normal rate, regular rhythm and normal heart sounds.   No murmur heard. Pulmonary/Chest: Effort normal and breath sounds normal. No respiratory distress. He has no wheezes. He has no rales.  Abdominal: Soft. There is no tenderness. There is no rebound and no guarding.  Musculoskeletal: Normal range of motion. He exhibits no edema or tenderness.  No midline spinal tenderness.   Neurological: He is alert and oriented to person, place, and time. He displays normal reflexes. No cranial nerve deficit or sensory deficit. He exhibits  normal muscle tone. Coordination normal. GCS eye subscore is 4. GCS verbal subscore is 5. GCS motor subscore is 6.  Skin: Skin is warm and dry. No erythema. No pallor.  Psychiatric: He has a normal mood and affect. His behavior is normal.  Nursing note and vitals reviewed.  ED Treatments / Results  DIAGNOSTIC STUDIES: Oxygen Saturation is 99% on RA, normal by my interpretation.   COORDINATION OF CARE: 4:18 PM-Discussed next steps with pt. Pt verbalized understanding and is agreeable with the plan.   Labs (all labs ordered are listed, but only abnormal results are displayed) Labs Reviewed - No data to display  EKG  EKG Interpretation None      Radiology No results found.    Procedures Procedures   Medications Ordered in ED Medications - No data to display  Initial Impression / Assessment and Plan / ED Course  I have reviewed the triage vital signs and the nursing notes.  Pertinent labs & imaging results that were available during my care of the patient were reviewed by me and considered in my medical decision making (see chart for details).     76 year old male with continued headaches after a fall over a week ago. Have a low suspicion for serious  intracranial injury or skull fracture. Incident over a week since the fall. History exam is nonfocal. He has some component of postconcussive syndrome. Return precautions were discussed.   Final Clinical Impressions(s) / ED Diagnoses   Final diagnoses:  Post concussive syndrome   New Prescriptions New Prescriptions   No medications on file   I personally preformed the services scribed in my presence. The recorded information has been reviewed is accurate. Barry Manifold, MD.     Barry Manifold, MD 08/15/16 [phone]

## 2016-08-05 NOTE — ED Notes (Signed)
Pt verbalized understanding of no driving and to use caution within 4 hours of taking pain meds due to meds cause drowsiness 

## 2016-08-14 ENCOUNTER — Encounter: Payer: Self-pay | Admitting: Internal Medicine

## 2016-09-24 ENCOUNTER — Encounter (INDEPENDENT_AMBULATORY_CARE_PROVIDER_SITE_OTHER): Payer: Self-pay | Admitting: Internal Medicine

## 2016-09-24 ENCOUNTER — Ambulatory Visit (INDEPENDENT_AMBULATORY_CARE_PROVIDER_SITE_OTHER): Payer: Medicare HMO | Admitting: Internal Medicine

## 2016-09-24 VITALS — BP 140/72 | HR 60 | Temp 98.0°F | Ht 70.0 in | Wt 167.6 lb

## 2016-09-24 DIAGNOSIS — L290 Pruritus ani: Secondary | ICD-10-CM | POA: Diagnosis not present

## 2016-09-24 DIAGNOSIS — R195 Other fecal abnormalities: Secondary | ICD-10-CM | POA: Diagnosis not present

## 2016-09-24 DIAGNOSIS — K219 Gastro-esophageal reflux disease without esophagitis: Secondary | ICD-10-CM

## 2016-09-24 LAB — CBC WITH DIFFERENTIAL/PLATELET
BASOS PCT: 0 %
Basophils Absolute: 0 cells/uL (ref 0–200)
Eosinophils Absolute: 74 cells/uL (ref 15–500)
Eosinophils Relative: 2 %
HEMATOCRIT: 35.3 % — AB (ref 38.5–50.0)
Hemoglobin: 11.2 g/dL — ABNORMAL LOW (ref 13.2–17.1)
LYMPHS PCT: 47 %
Lymphs Abs: 1739 cells/uL (ref 850–3900)
MCH: 30.2 pg (ref 27.0–33.0)
MCHC: 31.7 g/dL — ABNORMAL LOW (ref 32.0–36.0)
MCV: 95.1 fL (ref 80.0–100.0)
MONO ABS: 333 {cells}/uL (ref 200–950)
MPV: 9.7 fL (ref 7.5–12.5)
Monocytes Relative: 9 %
NEUTROS ABS: 1554 {cells}/uL (ref 1500–7800)
Neutrophils Relative %: 42 %
PLATELETS: 183 10*3/uL (ref 140–400)
RBC: 3.71 MIL/uL — AB (ref 4.20–5.80)
RDW: 14.1 % (ref 11.0–15.0)
WBC: 3.7 10*3/uL — AB (ref 3.8–10.8)

## 2016-09-24 LAB — HEPATIC FUNCTION PANEL
ALK PHOS: 36 U/L — AB (ref 40–115)
ALT: 11 U/L (ref 9–46)
AST: 16 U/L (ref 10–35)
Albumin: 3.5 g/dL — ABNORMAL LOW (ref 3.6–5.1)
BILIRUBIN INDIRECT: 0.1 mg/dL — AB (ref 0.2–1.2)
Bilirubin, Direct: 0.1 mg/dL (ref <=0.2)
TOTAL PROTEIN: 6.2 g/dL (ref 6.1–8.1)
Total Bilirubin: 0.2 mg/dL (ref 0.2–1.2)

## 2016-09-24 NOTE — Progress Notes (Signed)
Subjective:    Patient ID: [name], male    DOB: 1941-03-05, 76 y.o.   MRN: [id number]  HPI Presents today with c/o epigastric pain. He also states just before he has a BM he has flatus. He says his stools are soft. Stools are formed. He says his stools use to be "hard".  He also says he has itching to his rectum. Rectal itching x 3 weeks. He has tried ?ointment. Appetite is good. He has gained 12 pounds since his lat visit in October. No rectal bleeding. Does have some rectal itching. Has one BM day.   12/24/2014 EGD: hematemesis  Impression: Small sliding hiatal hernia without evidence of erosive esophagitis ring or stricture formation. Antral gastritis with small whitish plaque. Biopsy taken.  04/23/2013 Colonoscopy: Hx of colonic adenomas Findings:   Prep excellent. Three small polyps ablated via cold biopsy and submitted together. One was located at ascending colon and others 2 at hepatic flexure. Normal rectal mucosa. Small hemorrhoids above and below the dentate line.  3 small polyps removed and these are tubular adenomas. Patient has had adenomas in the past as well Reports to PCP Next colonoscopy in 5 years   Review of Systems Past Medical History:  Diagnosis Date  . Arthritis    fingers  . Chronic back pain   . Diabetes mellitus Since 1995   Takes Glucovance and Onglyza daily  . Diabetes mellitus without complication ([place])   . GERD (gastroesophageal reflux disease)    takes Dexilant and Omeprazole daily  . Herpes   . History of colon polyps   . History of gastric ulcer 25+yrs ago  . Hyperlipidemia Since 1995   takes Simvastatin daily  . Hypertension Since 1995   takes Metoprolol and Ramipril daily  . Hypertension   . Pneumonia    hx of;as a child  . Sleep apnea    doesn't use CPAP  . Urinary frequency   . Urinary urgency    takes Flomax daily    Past Surgical History:  Procedure Laterality Date  . BACK SURGERY  1962   Lumbar  .  BACK SURGERY    . bilateral cataract surgery    . CARDIAC CATHETERIZATION  02/16/10  . CERVICAL SPINE SURGERY    . COLONOSCOPY    . COLONOSCOPY N/A 04/23/2013   Procedure: COLONOSCOPY;  Surgeon: Rogene Houston, MD;  [facility];  Service: Endoscopy;  Laterality: N/A;  1030  . ESOPHAGOGASTRODUODENOSCOPY N/A 12/24/2014   Procedure: ESOPHAGOGASTRODUODENOSCOPY (EGD);  Surgeon: Rogene Houston, MD;  [facility];  Service: Endoscopy;  Laterality: N/A;  2:45  . ESOPHAGOGASTRODUODENOSCOPY (EGD) WITH ESOPHAGEAL DILATION    . HAND SURGERY     Right  . LUMBAR LAMINECTOMY/DECOMPRESSION MICRODISCECTOMY N/A 08/29/2012   Procedure: LUMBAR LAMINECTOMY/DECOMPRESSION MICRODISCECTOMY 1 LEVEL;  Surgeon: Floyce Stakes, MD;  Location: [facility];  Service: Neurosurgery;  Laterality: N/A;  Lumbar four-five laminectomies  . NECK SURGERY    . PENILE PROSTHESIS IMPLANT    . ROTATOR CUFF REPAIR     Right  . SHOULDER SURGERY    . WRIST SURGERY     Left  . WRIST SURGERY      Allergies  Allergen Reactions  . Aspirin Other (See Comments)    Higher dosage upsets stomach  . Bayer Aspirin [Aspirin]     Stomach "flares up"    Current Outpatient Prescriptions on File Prior to Visit  Medication Sig Dispense Refill  . acyclovir (  ZOVIRAX) 200 MG capsule Take 200 mg by mouth as needed. Reported on 10/06/2015    . aspirin 81 MG EC tablet Take 81 mg by mouth daily.      Marland Kitchen glyBURIDE-metformin (GLUCOVANCE) 5-500 MG per tablet Take 2 tablets by mouth 2 (two) times daily with a meal.     . LANTUS SOLOSTAR 100 UNIT/ML SOPN Inject 12 Units into the skin at bedtime.    . metoprolol (TOPROL-XL) 50 MG 24 hr tablet Take 50 mg by mouth daily.      Marland Kitchen omeprazole (PRILOSEC) 40 MG capsule Take 40 mg by mouth 2 (two) times daily.     Marland Kitchen oxyCODONE (OXY IR/ROXICODONE) 5 MG immediate release tablet Take 5 mg by mouth 3 (three) times daily as needed for moderate pain. For pain    . ramipril (ALTACE) 10 MG  capsule Take 10 mg by mouth daily.     . simvastatin (ZOCOR) 20 MG tablet Take 20 mg by mouth daily.    . tamsulosin (FLOMAX) 0.4 MG CAPS capsule Take 0.4 mg by mouth daily.    . [DISCONTINUED] bismuth-metronidazole-tetracycline (PLYERA) 140-125-125 MG per capsule Take 3 capsules by mouth 4 (four) times daily -  before meals and at bedtime.     No current facility-administered medications on file prior to visit.        Objective:   Physical Exam Blood pressure 140/72, pulse 60, temperature 98 F (36.7 C), height 5\' 10"  (1.778 m), weight 167 lb 9.6 oz (76 kg).  Alert and oriented. Skin warm and dry. Oral mucosa is moist.   . Sclera anicteric, conjunctivae is pink. Thyroid not enlarged. No cervical lymphadenopathy. Lungs clear. Heart regular rate and rhythm.  Abdomen is soft. Bowel sounds are positive. No hepatomegaly. No abdominal masses felt. No tenderness.  No edema to lower extremities.  Stool brown and guaiac negative.      Assessment & Plan:  GERD continue the Omeprazole. Get Fiber 4 gms daily.  Rectal itching: which has now resolved Change in stool. Stool brown and guaiac negative... Next colonoscopy in 2019. Will; get a CBC, and Hepatic function today.   Marland Kitchen

## 2016-09-24 NOTE — Patient Instructions (Addendum)
Get Fiber 4 gms over the counter and take daily.  CBC and Hepatic function today.

## 2016-10-16 ENCOUNTER — Other Ambulatory Visit (HOSPITAL_COMMUNITY): Payer: Self-pay | Admitting: Family Medicine

## 2016-10-16 ENCOUNTER — Ambulatory Visit (HOSPITAL_COMMUNITY)
Admission: RE | Admit: 2016-10-16 | Discharge: 2016-10-16 | Disposition: A | Discharge status: Home / Self Care | Payer: Medicare HMO | Source: Ambulatory Visit | Attending: Family Medicine | Admitting: Family Medicine

## 2016-10-16 DIAGNOSIS — R52 Pain, unspecified: Secondary | ICD-10-CM

## 2016-10-16 DIAGNOSIS — M25561 Pain in right knee: Secondary | ICD-10-CM | POA: Diagnosis present

## 2016-10-16 DIAGNOSIS — M1711 Unilateral primary osteoarthritis, right knee: Secondary | ICD-10-CM | POA: Insufficient documentation

## 2016-12-09 ENCOUNTER — Emergency Department (HOSPITAL_COMMUNITY)
Admission: EM | Admit: 2016-12-09 | Discharge: 2016-12-09 | Disposition: A | Discharge status: Home / Self Care | Payer: Medicare HMO | Attending: Emergency Medicine | Admitting: Emergency Medicine

## 2016-12-09 ENCOUNTER — Encounter (HOSPITAL_COMMUNITY): Payer: Self-pay | Admitting: *Deleted

## 2016-12-09 DIAGNOSIS — Z79899 Other long term (current) drug therapy: Secondary | ICD-10-CM | POA: Insufficient documentation

## 2016-12-09 DIAGNOSIS — K920 Hematemesis: Secondary | ICD-10-CM | POA: Diagnosis present

## 2016-12-09 DIAGNOSIS — K2951 Unspecified chronic gastritis with bleeding: Secondary | ICD-10-CM | POA: Diagnosis not present

## 2016-12-09 DIAGNOSIS — E1165 Type 2 diabetes mellitus with hyperglycemia: Secondary | ICD-10-CM | POA: Insufficient documentation

## 2016-12-09 DIAGNOSIS — Z7982 Long term (current) use of aspirin: Secondary | ICD-10-CM | POA: Insufficient documentation

## 2016-12-09 DIAGNOSIS — R739 Hyperglycemia, unspecified: Secondary | ICD-10-CM

## 2016-12-09 DIAGNOSIS — Z7984 Long term (current) use of oral hypoglycemic drugs: Secondary | ICD-10-CM | POA: Insufficient documentation

## 2016-12-09 LAB — CBC WITH DIFFERENTIAL/PLATELET
BASOS ABS: 0 10*3/uL (ref 0.0–0.1)
BASOS PCT: 1 %
EOS PCT: 2 %
Eosinophils Absolute: 0.1 10*3/uL (ref 0.0–0.7)
HCT: 34 % — ABNORMAL LOW (ref 39.0–52.0)
Hemoglobin: 11.7 g/dL — ABNORMAL LOW (ref 13.0–17.0)
LYMPHS PCT: 40 %
Lymphs Abs: 1.6 10*3/uL (ref 0.7–4.0)
MCH: 31.3 pg (ref 26.0–34.0)
MCHC: 34.4 g/dL (ref 30.0–36.0)
MCV: 90.9 fL (ref 78.0–100.0)
MONO ABS: 0.3 10*3/uL (ref 0.1–1.0)
Monocytes Relative: 7 %
Neutro Abs: 1.9 10*3/uL (ref 1.7–7.7)
Neutrophils Relative %: 50 %
PLATELETS: 162 10*3/uL (ref 150–400)
RBC: 3.74 MIL/uL — AB (ref 4.22–5.81)
RDW: 12.2 % (ref 11.5–15.5)
WBC: 3.8 10*3/uL — AB (ref 4.0–10.5)

## 2016-12-09 LAB — COMPREHENSIVE METABOLIC PANEL
ALBUMIN: 3.6 g/dL (ref 3.5–5.0)
ALT: 19 U/L (ref 17–63)
AST: 22 U/L (ref 15–41)
Alkaline Phosphatase: 44 U/L (ref 38–126)
Anion gap: 7 (ref 5–15)
BUN: 11 mg/dL (ref 6–20)
CHLORIDE: 105 mmol/L (ref 101–111)
CO2: 29 mmol/L (ref 22–32)
CREATININE: 0.87 mg/dL (ref 0.61–1.24)
Calcium: 9.2 mg/dL (ref 8.9–10.3)
GFR calc Af Amer: >60 mL/min (ref >60)
GLUCOSE: 323 mg/dL — AB (ref 65–99)
POTASSIUM: 4 mmol/L (ref 3.5–5.1)
SODIUM: 141 mmol/L (ref 135–145)
Total Bilirubin: 0.3 mg/dL (ref 0.3–1.2)
Total Protein: 7 g/dL (ref 6.5–8.1)

## 2016-12-09 LAB — LIPASE, BLOOD: LIPASE: 35 U/L (ref 11–51)

## 2016-12-09 LAB — ETHANOL: Alcohol, Ethyl (B): 8 mg/dL — ABNORMAL HIGH (ref <5)

## 2016-12-09 MED ORDER — PANTOPRAZOLE SODIUM 40 MG IV SOLR
40.0000 mg | Freq: Once | INTRAVENOUS | Status: AC
  Administered 2016-12-09: 40 mg via INTRAVENOUS
  Filled 2016-12-09: qty 40

## 2016-12-09 MED ORDER — ONDANSETRON HCL 4 MG/2ML IJ SOLN
4.0000 mg | Freq: Once | INTRAMUSCULAR | Status: AC
  Administered 2016-12-09: 4 mg via INTRAVENOUS
  Filled 2016-12-09: qty 2

## 2016-12-09 MED ORDER — GI COCKTAIL ~~LOC~~
30.0000 mL | Freq: Once | ORAL | Status: AC
  Administered 2016-12-09: 30 mL via ORAL
  Filled 2016-12-09: qty 30

## 2016-12-09 NOTE — ED Provider Notes (Signed)
[place] DEPT Provider Note   CSN: [id number] Arrival date & time: 12/09/16  0438     History   Chief Complaint Chief Complaint  Patient presents with  . Hematemesis    [name] is a 76 y.o. male.  Barry  This is a 76 year old male with a history of diabetes, reflux on Dexilant and omeprazole, gastric ulcer, hypertension who presents with hematemesis. Patient reports approximately 30 minutes prior to arrival he had onset of his reflux symptoms. He vomited once which was dark brown. He subsequently had several episodes of vomiting that progressively got more blood-tinged tinge. He denies any large volume hematemesis but does report one to 2 tablespoons of blood in his vomit. He reports epigastric discomfort" my reflux." Patient states that he drank "a few sips of beer" to try to help his symptoms. He denies any dark tarry stools or bloody stools. Denies any abdominal pain. He reports compliance with his daily reflux medications.  From GI records: 12/24/2014 EGD: hematemesis  Impression: Small sliding hiatal hernia without evidence of erosive esophagitis ring or stricture formation. Antral gastritis with small whitish plaque. Biopsy taken.  04/23/2013 Colonoscopy: Hx of colonic adenomas Findings:  Prep excellent. Three small polyps ablated via cold biopsy and submitted together. One was located at ascending colon and others 2 at hepatic flexure. Normal rectal mucosa. Small hemorrhoids above and below the dentate line.  3 small polyps removed and these are tubular adenomas. Patient has had adenomas in the past as well Reports to PCP Next colonoscopy in 5 years  Past Medical History:  Diagnosis Date  . Arthritis    fingers  . Chronic back pain   . Diabetes mellitus Since 1995   Takes Glucovance and Onglyza daily  . Diabetes mellitus without complication ([place])   . GERD (gastroesophageal reflux disease)    takes Dexilant and Omeprazole daily  . Herpes    . History of colon polyps   . History of gastric ulcer 25+yrs ago  . Hyperlipidemia Since 1995   takes Simvastatin daily  . Hypertension Since 1995   takes Metoprolol and Ramipril daily  . Hypertension   . Pneumonia    hx of;as a child  . Sleep apnea    doesn't use CPAP  . Urinary frequency   . Urinary urgency    takes Flomax daily    Patient Active Problem List   Diagnosis Date Noted  . 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 GERD (gastroesophageal reflux disease) 09/11/2011  . High cholesterol 09/11/2011  . Bronchitis 09/11/2011  . DM 02/13/2010  . ESSENTIAL HYPERTENSION, BENIGN 02/13/2010  . PRECORDIAL PAIN 02/13/2010    Past Surgical History:  Procedure Laterality Date  . BACK SURGERY  1962   Lumbar  . BACK SURGERY    . bilateral cataract surgery    . CARDIAC CATHETERIZATION  02/16/10  . CERVICAL SPINE SURGERY    . COLONOSCOPY    . COLONOSCOPY N/A 04/23/2013   Procedure: COLONOSCOPY;  Surgeon: Rogene Houston, MD;  [facility];  Service: Endoscopy;  Laterality: N/A;  1030  . ESOPHAGOGASTRODUODENOSCOPY N/A 12/24/2014   Procedure: ESOPHAGOGASTRODUODENOSCOPY (EGD);  Surgeon: Rogene Houston, MD;  Location: [facility];  Service: Endoscopy;  Laterality: N/A;  2:45  . ESOPHAGOGASTRODUODENOSCOPY (EGD) WITH ESOPHAGEAL DILATION    . HAND SURGERY     Right  . LUMBAR LAMINECTOMY/DECOMPRESSION MICRODISCECTOMY N/A 08/29/2012   Procedure: LUMBAR LAMINECTOMY/DECOMPRESSION MICRODISCECTOMY 1 LEVEL;  Surgeon: Floyce Stakes, MD;  Location:  [place] [facility];  Service: Neurosurgery;  Laterality: N/A;  Lumbar four-five laminectomies  . NECK SURGERY    . PENILE PROSTHESIS IMPLANT    . ROTATOR CUFF REPAIR     Right  . SHOULDER SURGERY    . WRIST SURGERY     Left  . WRIST SURGERY         Home Medications    Prior to Admission medications   Medication Sig Start Date End Date Taking? Authorizing Provider  acyclovir (ZOVIRAX) 200 MG capsule Take 200 mg by mouth as  needed. Reported on 10/06/2015 01/07/14   [provider]  aspirin 81 MG EC tablet Take 81 mg by mouth daily.      [provider]  glyBURIDE-metformin (GLUCOVANCE) 5-500 MG per tablet Take 2 tablets by mouth 2 (two) times daily with a meal.     [provider]  LANTUS SOLOSTAR 100 UNIT/ML SOPN Inject 12 Units into the skin at bedtime. 04/15/13   [provider]  metoprolol (TOPROL-XL) 50 MG 24 hr tablet Take 50 mg by mouth daily.      [provider]  omeprazole (PRILOSEC) 40 MG capsule Take 40 mg by mouth 2 (two) times daily.     [provider]  oxyCODONE (OXY IR/ROXICODONE) 5 MG immediate release tablet Take 5 mg by mouth 3 (three) times daily as needed for moderate pain. For pain 06/16/13   [provider]  ramipril (ALTACE) 10 MG capsule Take 10 mg by mouth daily.  02/05/14   [provider]  simvastatin (ZOCOR) 20 MG tablet Take 20 mg by mouth daily. 04/15/13   [provider]  tamsulosin (FLOMAX) 0.4 MG CAPS capsule Take 0.4 mg by mouth daily.    [provider]    Family History Family History  Problem Relation Age of Onset  . Heart attack Father 31       MI  . Hypertension Father   . Heart attack Brother 60       MI  . Diabetes Mother   . Heart attack Paternal Uncle     Social History Social History  Substance Use Topics  . Smoking status: Never Smoker  . Smokeless tobacco: Never Used  . Alcohol use Yes     Comment: occasionally beer     Allergies   Aspirin and Bayer aspirin [aspirin]   Review of Systems Review of Systems  Constitutional: Negative for fever.  Respiratory: Negative for shortness of breath.   Gastrointestinal: Positive for abdominal pain and vomiting. Negative for blood in stool and diarrhea.  Genitourinary: Negative for dysuria.  All other systems reviewed and are negative.    Physical Exam Updated Vital Signs BP (!) 180/103 (BP Location: Left Arm)   Temp  98.3 F (36.8 C) (Oral)   Resp 18   Ht 5\' 10"  (1.778 m)   Wt 72.6 kg (160 lb)   SpO2 97%   BMI 22.96 kg/m   Physical Exam  Constitutional: He is oriented to person, place, and time. He appears well-developed and well-nourished. No distress.  HENT:  Head: Normocephalic and atraumatic.  Cardiovascular: Normal rate, regular rhythm and normal heart sounds.   No murmur heard. Pulmonary/Chest: Effort normal and breath sounds normal. No respiratory distress. He has no wheezes.  Abdominal: Soft. Bowel sounds are normal. There is tenderness. There is no rebound.  Epigastric tenderness to palpation without rebound or guarding, belching  Musculoskeletal: He exhibits no edema.  Neurological: He is alert and oriented  to person, place, and time.  Skin: Skin is warm and dry.  Psychiatric: He has a normal mood and affect.  Nursing note and vitals reviewed.    ED Treatments / Results  Labs (all labs ordered are listed, but only abnormal results are displayed) Labs Reviewed  CBC WITH DIFFERENTIAL/PLATELET - Abnormal; Notable for the following:       Result Value   WBC 3.8 (*)    RBC 3.74 (*)    Hemoglobin 11.7 (*)    HCT 34.0 (*)    All other components within normal limits  COMPREHENSIVE METABOLIC PANEL - Abnormal; Notable for the following:    Glucose, Bld 323 (*)    All other components within normal limits  ETHANOL - Abnormal; Notable for the following:    Alcohol, Ethyl (B) 8 (*)    All other components within normal limits  LIPASE, BLOOD    EKG  EKG Interpretation None       Radiology No results found.  Procedures Procedures (including critical care time)  Medications Ordered in ED Medications  pantoprazole (PROTONIX) injection 40 mg (40 mg Intravenous Given 12/09/16 0513)  gi cocktail (Maalox,Lidocaine,Donnatal) (30 mLs Oral Given 12/09/16 0513)  ondansetron (ZOFRAN) injection 4 mg (4 mg Intravenous Given 12/09/16 0513)     Initial Impression / Assessment and Plan /  ED Course  I have reviewed the triage vital signs and the nursing notes.  Pertinent labs & imaging results that were available during my care of the patient were reviewed by me and considered in my medical decision making (see chart for details).  Clinical Course as of Dec 09 628  Sun Dec 09, 2016  0603 Workup reassuring. Hemoglobin is stable. Patient has not had any recurrent vomiting on the ED. He is resting comfortably on repeat evaluation. Repeat exam is benign. States he feels somewhat better after GI cocktail and Protonix IV. Suspect his known gastritis/GERD/peptic ulcer.  Continue GI regimen. Follow-up with Dr. Laural Golden on Monday.   [CH]    Clinical Course User Index [CH] Horton, Barbette Hair, MD    Patient presents with reported small amount of blood in his vomit. History of extensive gastritis, peptic ulcer, GERD. Belching on exam. Nontender abdomen. No recurrent emesis while in the department. Hemoglobin is stable at 11.7.  Patient is resting comfortable on reevaluation. He was given a GI cocktail and Protonix with improvement of his symptoms. No active bleeding noted while in the emergency department. Feels patient is safe for discharge home. Follow-up closely with GI. Continue PPIs at home.  After history, exam, and medical workup I feel the patient has been appropriately medically screened and is safe for discharge home. Pertinent diagnoses were discussed with the patient. Patient was given return precautions.   Final Clinical Impressions(s) / ED Diagnoses   Final diagnoses:  Other chronic gastritis with hemorrhage  Hyperglycemia    New Prescriptions New Prescriptions   No medications on file     Merryl Hacker, MD 12/09/16 [phone]

## 2016-12-09 NOTE — Discharge Instructions (Signed)
You were seen today for a small amount of blood in your vomit. This is likely related to your known reflux and gastritis. You did not have any recurrent symptoms in the ED. Hemoglobin is stable and your workup is reassuring. Follow-up closely with GI. Continue your Dexilant and omeprazole at home. If you develop recurrent bloody emesis, large volume emesis, bloody stools or any new or worsening symptoms you should be reevaluated.

## 2016-12-09 NOTE — ED Triage Notes (Signed)
Pt c/o vomiting light blood x 30 minutes; pt c/o epigastric pain; pt states he was having indigestion and he drank some beer and that is when the vomiting started

## 2017-03-07 ENCOUNTER — Encounter (INDEPENDENT_AMBULATORY_CARE_PROVIDER_SITE_OTHER): Payer: Self-pay | Admitting: Internal Medicine

## 2017-03-07 ENCOUNTER — Encounter (INDEPENDENT_AMBULATORY_CARE_PROVIDER_SITE_OTHER): Payer: Self-pay

## 2017-03-25 ENCOUNTER — Ambulatory Visit (HOSPITAL_COMMUNITY)
Admission: RE | Admit: 2017-03-25 | Discharge: 2017-03-25 | Disposition: A | Discharge status: Home / Self Care | Payer: Medicare HMO | Source: Ambulatory Visit | Attending: Family Medicine | Admitting: Family Medicine

## 2017-03-25 ENCOUNTER — Other Ambulatory Visit (HOSPITAL_COMMUNITY): Payer: Self-pay | Admitting: Family Medicine

## 2017-03-25 DIAGNOSIS — M47816 Spondylosis without myelopathy or radiculopathy, lumbar region: Secondary | ICD-10-CM | POA: Diagnosis not present

## 2017-03-25 DIAGNOSIS — M5440 Lumbago with sciatica, unspecified side: Secondary | ICD-10-CM | POA: Insufficient documentation

## 2017-03-25 DIAGNOSIS — M5136 Other intervertebral disc degeneration, lumbar region: Secondary | ICD-10-CM | POA: Diagnosis not present

## 2017-03-25 DIAGNOSIS — M1288 Other specific arthropathies, not elsewhere classified, other specified site: Secondary | ICD-10-CM | POA: Diagnosis not present

## 2017-04-09 ENCOUNTER — Encounter (INDEPENDENT_AMBULATORY_CARE_PROVIDER_SITE_OTHER): Payer: Self-pay | Admitting: *Deleted

## 2017-04-09 ENCOUNTER — Ambulatory Visit (INDEPENDENT_AMBULATORY_CARE_PROVIDER_SITE_OTHER): Payer: Medicare HMO | Admitting: Internal Medicine

## 2017-04-09 ENCOUNTER — Encounter (INDEPENDENT_AMBULATORY_CARE_PROVIDER_SITE_OTHER): Payer: Self-pay | Admitting: Internal Medicine

## 2017-04-09 VITALS — BP 140/78 | HR 60 | Temp 98.0°F | Ht 70.0 in | Wt 161.0 lb

## 2017-04-09 DIAGNOSIS — K838 Other specified diseases of biliary tract: Secondary | ICD-10-CM

## 2017-04-09 NOTE — Patient Instructions (Signed)
US abdomen 

## 2017-04-09 NOTE — Progress Notes (Addendum)
Subjective:    Patient ID: [name], male    DOB: 1940/12/17, 76 y.o.   MRN: [id number] Last seen in March of this year.  HPI PCP Dr Cindie Laroche. Presents today with c/o abdominal pain.  He points to his umbilicus when he has the pain. No pain today.  He says the only pain he has now is in his back.  He says he has not had the umbilical pain for a couple of weeks. He is not sure if he is hungry or what when he has the pain. No NSAIDS except for the Baby ASA. For the most part he feels good once he gets up and moves around.  Appetite is good. Has a BM daily. No melena or BRRB.  Hx of dilated CBD. Will repeat an [place]. Has not had US abdomen since 2015. CBD in 2015 was 12.5.   12/24/2014 EGD: hematemesis  Impression: Small sliding hiatal hernia without evidence of erosive esophagitis ring or stricture formation. Antral gastritis with small whitish plaque. Biopsy taken.  04/23/2013 Colonoscopy: Hx of colonic adenomas Findings:  Prep excellent. Three small polyps ablated via cold biopsy and submitted together. One was located at ascending colon and others 2 at hepatic flexure. Normal rectal mucosa. Small hemorrhoids above and below the dentate line.  3 small polyps removed and these are tubular adenomas. Patient has had adenomas in the past as well Reports to PCP Next colonoscopy in 5 years  CMP Latest Ref Rng & Units 12/09/2016 09/24/2016 04/09/2016  Glucose 65 - 99 mg/dL 323(H) - -  BUN 6 - 20 mg/dL 11 - -  Creatinine 0.61 - 1.24 mg/dL 0.87 - -  Sodium 135 - 145 mmol/L 141 - -  Potassium 3.5 - 5.1 mmol/L 4.0 - -  Chloride 101 - 111 mmol/L 105 - -  CO2 22 - 32 mmol/L 29 - -  Calcium 8.9 - 10.3 mg/dL 9.2 - -  Total Protein 6.5 - 8.1 g/dL 7.0 6.2 6.7  Total Bilirubin 0.3 - 1.2 mg/dL 0.3 0.2 0.3  Alkaline Phos 38 - 126 U/L 44 36(L) 45  AST 15 - 41 U/L 22 16 12   ALT 17 - 63 U/L 19 11 8(L)     Review of Systems Current Outpatient Prescriptions on File Prior to Visit    Medication Sig Dispense Refill  . acyclovir (ZOVIRAX) 200 MG capsule Take 200 mg by mouth as needed. Reported on 10/06/2015    . aspirin 81 MG EC tablet Take 81 mg by mouth daily.      Marland Kitchen glyBURIDE-metformin (GLUCOVANCE) 5-500 MG per tablet Take 2 tablets by mouth 2 (two) times daily with a meal.     . LANTUS SOLOSTAR 100 UNIT/ML SOPN Inject 20 Units into the skin at bedtime.     . metoprolol (TOPROL-XL) 50 MG 24 hr tablet Take 50 mg by mouth daily.      Marland Kitchen omeprazole (PRILOSEC) 40 MG capsule Take 40 mg by mouth 2 (two) times daily.     Marland Kitchen oxyCODONE (OXY IR/ROXICODONE) 5 MG immediate release tablet Take 5 mg by mouth 3 (three) times daily as needed for moderate pain. For pain    . ramipril (ALTACE) 10 MG capsule Take 10 mg by mouth daily.     . simvastatin (ZOCOR) 20 MG tablet Take 20 mg by mouth daily.    . tamsulosin (FLOMAX) 0.4 MG CAPS capsule Take 0.4 mg by mouth daily.    . [DISCONTINUED] bismuth-metronidazole-tetracycline (PLYERA) 140-125-125 MG per  capsule Take 3 capsules by mouth 4 (four) times daily -  before meals and at bedtime.     No current facility-administered medications on file prior to visit.        Objective:   Physical Exam  Vitals:   04/09/17 1107  Weight: 161 lb (73 kg)  Height: 5\' 10"  (1.778 m)   Alert and oriented. Skin warm and dry. Oral mucosa is moist.   . Sclera anicteric, conjunctivae is pink. Thyroid not enlarged. No cervical lymphadenopathy. Lungs clear. Heart regular rate and rhythm.  Abdomen is soft. Bowel sounds are positive. No hepatomegaly. No abdominal masses felt. No tenderness.  No edema to lower extremities.            Assessment & Plan:  Epigastric pain now has resolved. ? GERD. He will continue the Omeprazole. OV in 1 year. Call if pain returns. Marland Kitchen Hx of CBD dilatation. US abdomen.

## 2017-04-15 ENCOUNTER — Ambulatory Visit (HOSPITAL_COMMUNITY)
Admission: RE | Admit: 2017-04-15 | Discharge: 2017-04-15 | Disposition: A | Discharge status: Home / Self Care | Payer: Medicare HMO | Source: Ambulatory Visit | Attending: Internal Medicine | Admitting: Internal Medicine

## 2017-04-15 DIAGNOSIS — K838 Other specified diseases of biliary tract: Secondary | ICD-10-CM | POA: Diagnosis not present

## 2017-04-24 ENCOUNTER — Emergency Department (HOSPITAL_COMMUNITY): Payer: Medicare HMO

## 2017-04-24 ENCOUNTER — Emergency Department (HOSPITAL_COMMUNITY)
Admission: EM | Admit: 2017-04-24 | Discharge: 2017-04-24 | Disposition: A | Discharge status: Home / Self Care | Payer: Medicare HMO | Attending: Emergency Medicine | Admitting: Emergency Medicine

## 2017-04-24 ENCOUNTER — Encounter (HOSPITAL_COMMUNITY): Payer: Self-pay | Admitting: Emergency Medicine

## 2017-04-24 DIAGNOSIS — M25542 Pain in joints of left hand: Secondary | ICD-10-CM | POA: Insufficient documentation

## 2017-04-24 DIAGNOSIS — Z7982 Long term (current) use of aspirin: Secondary | ICD-10-CM | POA: Diagnosis not present

## 2017-04-24 DIAGNOSIS — Z7984 Long term (current) use of oral hypoglycemic drugs: Secondary | ICD-10-CM | POA: Insufficient documentation

## 2017-04-24 DIAGNOSIS — Z79899 Other long term (current) drug therapy: Secondary | ICD-10-CM | POA: Insufficient documentation

## 2017-04-24 DIAGNOSIS — I10 Essential (primary) hypertension: Secondary | ICD-10-CM | POA: Insufficient documentation

## 2017-04-24 DIAGNOSIS — G8929 Other chronic pain: Secondary | ICD-10-CM | POA: Insufficient documentation

## 2017-04-24 DIAGNOSIS — Y9389 Activity, other specified: Secondary | ICD-10-CM | POA: Diagnosis not present

## 2017-04-24 DIAGNOSIS — Y92411 Interstate highway as the place of occurrence of the external cause: Secondary | ICD-10-CM | POA: Diagnosis not present

## 2017-04-24 DIAGNOSIS — E119 Type 2 diabetes mellitus without complications: Secondary | ICD-10-CM | POA: Insufficient documentation

## 2017-04-24 DIAGNOSIS — M79641 Pain in right hand: Secondary | ICD-10-CM | POA: Diagnosis not present

## 2017-04-24 DIAGNOSIS — S199XXA Unspecified injury of neck, initial encounter: Secondary | ICD-10-CM | POA: Diagnosis not present

## 2017-04-24 DIAGNOSIS — Y998 Other external cause status: Secondary | ICD-10-CM | POA: Insufficient documentation

## 2017-04-24 DIAGNOSIS — M545 Low back pain: Secondary | ICD-10-CM | POA: Insufficient documentation

## 2017-04-24 MED ORDER — CYCLOBENZAPRINE HCL 10 MG PO TABS: 10.0000 mg | ORAL_TABLET | Freq: Two times a day (BID) | ORAL | 0 refills | Status: DC | PRN
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
## 2017-09-18 ENCOUNTER — Telehealth (INDEPENDENT_AMBULATORY_CARE_PROVIDER_SITE_OTHER): Payer: Self-pay | Admitting: *Deleted

## 2017-09-18 NOTE — Telephone Encounter (Signed)
Threw up a small amt of blood. I advised him I he more vomiting to go to the ED

## 2017-09-18 NOTE — Telephone Encounter (Signed)
Patient called stating starting at 8:00 this morning he started throwing up blood. Patient stated he went to use the bathroom and laid back down and then got up throwing up and he could taste the "yellow pills" he takes and some blood behind it..   [phone]

## 2017-09-20 ENCOUNTER — Other Ambulatory Visit: Payer: Self-pay

## 2017-09-20 ENCOUNTER — Encounter (HOSPITAL_COMMUNITY): Payer: Self-pay | Admitting: Emergency Medicine

## 2017-09-20 ENCOUNTER — Emergency Department (HOSPITAL_COMMUNITY): Payer: Medicare HMO

## 2017-09-20 ENCOUNTER — Emergency Department (HOSPITAL_COMMUNITY)
Admission: EM | Admit: 2017-09-20 | Discharge: 2017-09-20 | Disposition: A | Discharge status: Home / Self Care | Payer: Medicare HMO | Attending: Emergency Medicine | Admitting: Emergency Medicine

## 2017-09-20 DIAGNOSIS — Z794 Long term (current) use of insulin: Secondary | ICD-10-CM | POA: Insufficient documentation

## 2017-09-20 DIAGNOSIS — M545 Low back pain, unspecified: Secondary | ICD-10-CM

## 2017-09-20 DIAGNOSIS — Z7982 Long term (current) use of aspirin: Secondary | ICD-10-CM | POA: Insufficient documentation

## 2017-09-20 DIAGNOSIS — E119 Type 2 diabetes mellitus without complications: Secondary | ICD-10-CM | POA: Insufficient documentation

## 2017-09-20 DIAGNOSIS — I10 Essential (primary) hypertension: Secondary | ICD-10-CM | POA: Insufficient documentation

## 2017-09-20 DIAGNOSIS — R300 Dysuria: Secondary | ICD-10-CM | POA: Insufficient documentation

## 2017-09-20 DIAGNOSIS — N401 Enlarged prostate with lower urinary tract symptoms: Secondary | ICD-10-CM | POA: Insufficient documentation

## 2017-09-20 DIAGNOSIS — N400 Enlarged prostate without lower urinary tract symptoms: Secondary | ICD-10-CM

## 2017-09-20 DIAGNOSIS — Z79899 Other long term (current) drug therapy: Secondary | ICD-10-CM | POA: Insufficient documentation

## 2017-09-20 LAB — URINALYSIS, ROUTINE W REFLEX MICROSCOPIC
BACTERIA UA: NONE SEEN
Bilirubin Urine: NEGATIVE
Glucose, UA: NEGATIVE mg/dL
Ketones, ur: NEGATIVE mg/dL
Leukocytes, UA: NEGATIVE
Nitrite: NEGATIVE
Protein, ur: NEGATIVE mg/dL
SPECIFIC GRAVITY, URINE: 1.006 (ref 1.005–1.030)
Squamous Epithelial / LPF: NONE SEEN
pH: 6 (ref 5.0–8.0)

## 2017-09-20 LAB — CBC WITH DIFFERENTIAL/PLATELET
Basophils Absolute: 0 10*3/uL (ref 0.0–0.1)
Basophils Relative: 0 %
Eosinophils Absolute: 0.1 10*3/uL (ref 0.0–0.7)
Eosinophils Relative: 2 %
HCT: 36.1 % — ABNORMAL LOW (ref 39.0–52.0)
HEMOGLOBIN: 11.8 g/dL — AB (ref 13.0–17.0)
LYMPHS ABS: 1.9 10*3/uL (ref 0.7–4.0)
LYMPHS PCT: 52 %
MCH: 29.9 pg (ref 26.0–34.0)
MCHC: 32.7 g/dL (ref 30.0–36.0)
MCV: 91.4 fL (ref 78.0–100.0)
Monocytes Absolute: 0.3 10*3/uL (ref 0.1–1.0)
Monocytes Relative: 7 %
NEUTROS ABS: 1.4 10*3/uL — AB (ref 1.7–7.7)
NEUTROS PCT: 39 %
Platelets: 225 10*3/uL (ref 150–400)
RBC: 3.95 MIL/uL — AB (ref 4.22–5.81)
RDW: 12.6 % (ref 11.5–15.5)
WBC: 3.6 10*3/uL — ABNORMAL LOW (ref 4.0–10.5)

## 2017-09-20 LAB — COMPREHENSIVE METABOLIC PANEL
ALK PHOS: 40 U/L (ref 38–126)
ALT: 12 U/L — AB (ref 17–63)
AST: 18 U/L (ref 15–41)
Albumin: 4.2 g/dL (ref 3.5–5.0)
Anion gap: 9 (ref 5–15)
BUN: 15 mg/dL (ref 6–20)
CALCIUM: 9.9 mg/dL (ref 8.9–10.3)
CO2: 27 mmol/L (ref 22–32)
CREATININE: 1.08 mg/dL (ref 0.61–1.24)
Chloride: 104 mmol/L (ref 101–111)
Glucose, Bld: 151 mg/dL — ABNORMAL HIGH (ref 65–99)
Potassium: 4.7 mmol/L (ref 3.5–5.1)
Sodium: 140 mmol/L (ref 135–145)
Total Bilirubin: 0.7 mg/dL (ref 0.3–1.2)
Total Protein: 7.9 g/dL (ref 6.5–8.1)

## 2017-09-20 MED ORDER — TRAMADOL HCL 50 MG PO TABS: 50.0000 mg | ORAL_TABLET | Freq: Four times a day (QID) | ORAL | 0 refills | Status: DC | PRN

## 2017-09-20 MED ORDER — KETOROLAC TROMETHAMINE 60 MG/2ML IM SOLN
60.0000 mg | Freq: Once | INTRAMUSCULAR | Status: AC
  Administered 2017-09-20: 60 mg via INTRAMUSCULAR
  Filled 2017-09-20: qty 2

## 2017-09-20 NOTE — ED Notes (Signed)
EDP in triage to evaluate pt,

## 2017-09-20 NOTE — ED Triage Notes (Signed)
Pt c/o lower, non-radiating back pain x 2 weeks. Reports intermittent issues with urinary retention and dysuria. No injury/fall.

## 2017-09-20 NOTE — Discharge Instructions (Signed)
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  Tramadol as needed for pain every 6 hours  Please obtain all of your results from medical records or have your doctors office obtain the results - share them with your doctor - you should be seen at your doctors office in the next 2 days. Call today to arrange your follow up. Take the medications as prescribed. Please review all of the medicines and only take them if you do not have an allergy to them. Please be aware that if you are taking birth control pills, taking other prescriptions, ESPECIALLY ANTIBIOTICS may make the birth control ineffective - if this is the case, either do not engage in sexual activity or use alternative methods of birth control such as condoms until you have finished the medicine and your family doctor says it is OK to restart them. If you are on a blood thinner such as COUMADIN, be aware that any other medicine that you take may cause the coumadin to either work too much, or not enough - you should have your coumadin level rechecked in next 7 days if this is the case.  ?  It is also a possibility that you have an allergic reaction to any of the medicines that you have been prescribed - Everybody reacts differently to medications and while MOST people have no trouble with most medicines, you may have a reaction such as nausea, vomiting, rash, swelling, shortness of breath. If this is the case, please stop taking the medicine immediately and contact your physician.  ?  You should return to the ER if you develop severe or worsening symptoms.

## 2017-09-20 NOTE — ED Provider Notes (Signed)
[facility] EMERGENCY DEPARTMENT Provider Note   CSN: [id number] Arrival date & time: 09/20/17  1403     History   Chief Complaint Chief Complaint  Patient presents with  . Back Pain    HPI [name] is a 77 y.o. male.  HPI  The pt is a 77 y/o male - has had prior back surgery in 62 as well as 3 years ago - unsur of why - thinks was a herniated disc.  Has had 2 weeks of increased pain in the mid lower back with radiation to the groins R>L,  It sometimes radiates down the legs - he has had this for 2 weeks - worse with bending over, not associated with weakness or numbness in the legs.  No urinary symptoms including no retention or numbness.  No fevers, no hx of CA, no hx of IVDU.  Pain is located midline lower back.  Has pain at night but better  Past Medical History:  Diagnosis Date  . Arthritis    fingers  . Chronic back pain   . Diabetes mellitus Since 1995   Takes Glucovance and Onglyza daily  . Diabetes mellitus without complication ([place])   . GERD (gastroesophageal reflux disease)    takes Dexilant and Omeprazole daily  . Herpes   . History of colon polyps   . History of gastric ulcer 25+yrs ago  . Hyperlipidemia Since 1995   takes Simvastatin daily  . Hypertension Since 1995   takes Metoprolol and Ramipril daily  . Hypertension   . Pneumonia    hx of;as a child  . Sleep apnea    doesn't use CPAP  . Urinary frequency   . Urinary urgency    takes Flomax daily    Patient Active Problem List   Diagnosis Date Noted  . 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 GERD (gastroesophageal reflux disease) 09/11/2011  . High cholesterol 09/11/2011  . Bronchitis 09/11/2011  . DM 02/13/2010  . ESSENTIAL HYPERTENSION, BENIGN 02/13/2010  . PRECORDIAL PAIN 02/13/2010    Past Surgical History:  Procedure Laterality Date  . BACK SURGERY  1962   Lumbar  . BACK SURGERY    . bilateral cataract surgery    . CARDIAC CATHETERIZATION  02/16/10  . CERVICAL SPINE SURGERY     . COLONOSCOPY    . COLONOSCOPY N/A 04/23/2013   Procedure: COLONOSCOPY;  Surgeon: Rogene Houston, MD;  Location: [facility];  Service: Endoscopy;  Laterality: N/A;  1030  . ESOPHAGOGASTRODUODENOSCOPY N/A 12/24/2014   Procedure: ESOPHAGOGASTRODUODENOSCOPY (EGD);  Surgeon: Rogene Houston, MD;  Location: [facility];  Service: Endoscopy;  Laterality: N/A;  2:45  . ESOPHAGOGASTRODUODENOSCOPY (EGD) WITH ESOPHAGEAL DILATION    . HAND SURGERY     Right  . LUMBAR LAMINECTOMY/DECOMPRESSION MICRODISCECTOMY N/A 08/29/2012   Procedure: LUMBAR LAMINECTOMY/DECOMPRESSION MICRODISCECTOMY 1 LEVEL;  Surgeon: Floyce Stakes, MD;  Location: [place] [facility] OR[facility];  Service: Neurosurgery;  Laterality: N/A;  Lumbar four-five laminectomies  . NECK SURGERY    . PENILE PROSTHESIS IMPLANT    . ROTATOR CUFF REPAIR     Right  . SHOULDER SURGERY    . WRIST SURGERY     Left  . WRIST SURGERY         Home Medications    Prior to Admission medications   Medication Sig Start Date End Date Taking? Authorizing Provider  acyclovir (ZOVIRAX) 200 MG capsule Take 200 mg by mouth as needed. Reported on 10/06/2015 01/07/14   [provider]  aspirin 81 MG EC tablet Take 81 mg by mouth daily.      [provider]  cyclobenzaprine (FLEXERIL) 10 MG tablet Take 1 tablet (10 mg total) by mouth 2 (two) times daily as needed for muscle spasms. 04/24/17   Domenic Moras, PA-C  glyBURIDE-metformin (GLUCOVANCE) 5-500 MG per tablet Take 2 tablets by mouth 2 (two) times daily with a meal.     [provider]  LANTUS SOLOSTAR 100 UNIT/ML SOPN Inject 20 Units into the skin at bedtime.  04/15/13   [provider]  metoprolol (TOPROL-XL) 50 MG 24 hr tablet Take 50 mg by mouth daily.      [provider]  omeprazole (PRILOSEC) 40 MG capsule Take 40 mg by mouth 2 (two) times daily.     [provider]  oxyCODONE (OXY IR/ROXICODONE) 5 MG immediate release tablet Take 5 mg by mouth 3 (three)  times daily as needed for moderate pain. For pain 06/16/13   [provider]  ramipril (ALTACE) 10 MG capsule Take 10 mg by mouth daily.  02/05/14   [provider]  simvastatin (ZOCOR) 20 MG tablet Take 20 mg by mouth daily. 04/15/13   [provider]  tamsulosin (FLOMAX) 0.4 MG CAPS capsule Take 0.4 mg by mouth daily.    [provider]  traMADol (ULTRAM) 50 MG tablet Take 1 tablet (50 mg total) by mouth every 6 (six) hours as needed. 09/20/17   Noemi Chapel, MD  bismuth-metronidazole-tetracycline [facility]) [phone] MG per capsule Take 3 capsules by mouth 4 (four) times daily -  before meals and at bedtime.  09/26/11  [provider]    Family History Family History  Problem Relation Age of Onset  . Heart attack Father 16       MI  . Hypertension Father   . Heart attack Brother 7       MI  . Diabetes Mother   . Heart attack Paternal Uncle     Social History Social History   Tobacco Use  . Smoking status: Never Smoker  . Smokeless tobacco: Never Used  Substance Use Topics  . Alcohol use: Yes    Comment: occasionally beer  . Drug use: Yes    Frequency: 3.0 times per week    Types: Marijuana    Comment: last use 09/19/2017     Allergies   Aspirin and Bayer aspirin [aspirin]   Review of Systems Review of Systems  Constitutional: Negative for chills and fever.  Cardiovascular: Negative for leg swelling.  Gastrointestinal: Negative for nausea and vomiting.       No incontinence of bowel  Genitourinary: Negative for difficulty urinating.       No incontinence or retention  Musculoskeletal: Positive for back pain. Negative for neck pain.  Skin: Negative for rash.  Neurological: Negative for weakness and numbness.     Physical Exam Updated Vital Signs BP (!) 147/86 (BP Location: Right Arm)   Pulse 65   Temp 98.2 F (36.8 C) (Oral)   Resp 16   Ht 5\' 7"  (1.702 m)   Wt 72.6 kg (160 lb)   SpO2 100%   BMI 25.06 kg/m    Physical Exam  Constitutional: He appears well-developed and well-nourished. No distress.  HENT:  Head: Normocephalic and atraumatic.  Eyes: Conjunctivae are normal. Right eye exhibits no discharge. Left eye exhibits no discharge. No scleral icterus.  Cardiovascular: Normal rate and regular rhythm.  Pulmonary/Chest: Effort normal and breath sounds  normal.  Genitourinary:  Genitourinary Comments: No LAD of the inguinal region - no hernias, normal penis, scrotum an testicles.  Musculoskeletal: He exhibits no edema.  Tenderness of the back over the L4,5,S spine No tenderness over the Cervical, ThoracicSpines  Neurological:  Speech is clear, strength in the UE and LE's are normal at the major muscle groups including the hip, knee and ankles.  Sensation in tact to light touch and pin prick of the bilateral LE's.  Normal reflexes at the knees bilaterally.  Gait antalgic secondary to pain (minimally)  Skin: Skin is warm and dry. No rash noted. He is not diaphoretic.     ED Treatments / Results  Labs (all labs ordered are listed, but only abnormal results are displayed) Labs Reviewed  URINALYSIS, ROUTINE W REFLEX MICROSCOPIC - Abnormal; Notable for the following components:      Result Value   Color, Urine STRAW (*)    Hgb urine dipstick SMALL (*)    All other components within normal limits  CBC WITH DIFFERENTIAL/PLATELET - Abnormal; Notable for the following components:   WBC 3.6 (*)    RBC 3.95 (*)    Hemoglobin 11.8 (*)    HCT 36.1 (*)    Neutro Abs 1.4 (*)    All other components within normal limits  COMPREHENSIVE METABOLIC PANEL - Abnormal; Notable for the following components:   Glucose, Bld 151 (*)    ALT 12 (*)    All other components within normal limits    EKG  EKG Interpretation None       Radiology Ct Renal Stone Study  Result Date: 09/20/2017 CLINICAL DATA:  No new urinary tract stone. Low back pain for 2 weeks. Urinary retention and dysuria. EXAM: CT  ABDOMEN AND PELVIS WITHOUT CONTRAST TECHNIQUE: Multidetector CT imaging of the abdomen and pelvis was performed following the standard protocol without IV contrast. COMPARISON:  CT of the abdomen and pelvis 01/25/2012. Lumbar spine radiographs 04/24/2017. FINDINGS: Lower chest: Excellent recall bronchiectasis is again noted at the lung bases bilaterally without superimposed airspace disease. The heart size is normal. No significant pleural or pericardial effusion is present. Hepatobiliary: No focal liver abnormality is seen. No gallstones, gallbladder wall thickening, or biliary dilatation. Pancreas: Unremarkable. No pancreatic ductal dilatation or surrounding inflammatory changes. Spleen: Calcification along the lateral margin of the spleen is stable. No focal lesions are evident. Adrenals/Urinary Tract: Adrenal glands are normal bilaterally. No stone or mass lesion is present. There is no obstruction. The ureters are within normal limits bilaterally. The urinary bladder is within normal limits. Stomach/Bowel: The stomach and duodenum are within normal limits. Small bowel is unremarkable. The terminal ileum is within normal limits. The appendix is visualized and normal. Vascular/Lymphatic: Atherosclerotic calcifications are present without aneurysm. Reproductive: A penile prosthesis is noted. The prostate gland is somewhat prominent and indents the inferior margin of the urinary bladder. It measures 4 cm in transverse diameter. Other: No abdominal wall hernia or abnormality. No abdominopelvic ascites. Musculoskeletal: Progressive endplate degenerative changes and sclerosis are present at L3-4, L4-5, and L5-S1. A transitional S1 segment is again noted. Foraminal narrowing is evident bilaterally at L4-5 and L5-S1. IMPRESSION: 1. No urinary tract obstruction or stone. 2. Prominent prostate gland indents the inferior border of the urinary bladder. 3. progressive degenerative changes in the lower lumbar spine. 4. Penile  prosthesis. Electronically Signed   By: San Morelle [name]   On: 09/20/2017 16:28    Procedures Procedures (including critical care time)  Medications Ordered  in ED Medications  ketorolac (TORADOL) injection 60 mg (60 mg Intramuscular Given 09/20/17 1607)     Initial Impression / Assessment and Plan / ED Course  I have reviewed the triage vital signs and the nursing notes.  Pertinent labs & imaging results that were available during my care of the patient were reviewed by me and considered in my medical decision making (see chart for details).    No CVA ttp - has had intermittent dysuria, no palpable AAA, no groin findings.  CT renal to furter evaluate spine / renal / abd.  Ct shows degenerative disease Labs unremarkable Pt informed - stable for d/c Given toradol Rx for Tramadol Instructions for return given  Final Clinical Impressions(s) / ED Diagnoses   Final diagnoses:  Acute midline low back pain without sciatica  Prostate hypertrophy    ED Discharge Orders        Ordered    traMADol (ULTRAM) 50 MG tablet  Every 6 hours PRN     09/20/17 1703       Noemi Chapel, MD 09/20/17 1704

## 2017-09-25 ENCOUNTER — Ambulatory Visit (INDEPENDENT_AMBULATORY_CARE_PROVIDER_SITE_OTHER): Payer: Medicare HMO | Admitting: Internal Medicine

## 2017-09-25 ENCOUNTER — Encounter (INDEPENDENT_AMBULATORY_CARE_PROVIDER_SITE_OTHER): Payer: Self-pay | Admitting: Internal Medicine

## 2017-09-25 VITALS — BP 180/82 | HR 68 | Temp 97.6°F | Ht 70.0 in | Wt 157.0 lb

## 2017-09-25 DIAGNOSIS — K219 Gastro-esophageal reflux disease without esophagitis: Secondary | ICD-10-CM

## 2017-09-25 NOTE — Progress Notes (Signed)
Subjective:    Patient ID: [name], male    DOB: 1940-10-11, 77 y.o.   MRN: [id number]  HPI Here today for f/u. Last seen in October of 2018. Hx of chronic GERD and maintained on Omeprazole. Hx of   CBD dilatation and pancreatic duct. US abdomen shows this was stable.  His appetite is good. He has lost about 4 pound since his last visit in October. His BMs are normal. No melena or BRRB. He says his urine 2 weeks ago he could not void. He went back to the BR and was able to void.  Recently seen in the ED with back pain.   12/24/2014 EGD: hematemesis  Impression: Small sliding hiatal hernia without evidence of erosive esophagitis ring or stricture formation. Antral gastritis with small whitish plaque. Biopsy taken.   04/15/2017 US abdomen:  IMPRESSION: Persistent mild dilation of the common bile duct and pancreatic duct. No acute intra-abdominal abnormality is observed.   04/23/2013 Colonoscopy: Hx of colonic adenomas Findings:  Prep excellent. Three small polyps ablated via cold biopsy and submitted together. One was located at ascending colon and others 2 at hepatic flexure. Normal rectal mucosa. Small hemorrhoids above and below the dentate line.  3 small polyps removed and these are tubular adenomas. Patient has had adenomas in the past as well Reports to PCP Next colonoscopy in 5 years   Review of Systems Past Medical History:  Diagnosis Date  . Arthritis    fingers  . Chronic back pain   . Diabetes mellitus Since 1995   Takes Glucovance and Onglyza daily  . Diabetes mellitus without complication ([place])   . GERD (gastroesophageal reflux disease)    takes Dexilant and Omeprazole daily  . Herpes   . History of colon polyps   . History of gastric ulcer 25+yrs ago  . Hyperlipidemia Since 1995   takes Simvastatin daily  . Hypertension Since 1995   takes Metoprolol and Ramipril daily  . Hypertension   . Pneumonia    hx of;as a child  . Sleep  apnea    doesn't use CPAP  . Urinary frequency   . Urinary urgency    takes Flomax daily    Past Surgical History:  Procedure Laterality Date  . BACK SURGERY  1962   Lumbar  . BACK SURGERY    . bilateral cataract surgery    . CARDIAC CATHETERIZATION  02/16/10  . CERVICAL SPINE SURGERY    . COLONOSCOPY    . COLONOSCOPY N/A 04/23/2013   Procedure: COLONOSCOPY;  Surgeon: Rogene Houston, MD;  [facility];  Service: Endoscopy;  Laterality: N/A;  1030  . ESOPHAGOGASTRODUODENOSCOPY N/A 12/24/2014   Procedure: ESOPHAGOGASTRODUODENOSCOPY (EGD);  Surgeon: Rogene Houston, MD;  Location: [facility];  Service: Endoscopy;  Laterality: N/A;  2:45  . ESOPHAGOGASTRODUODENOSCOPY (EGD) WITH ESOPHAGEAL DILATION    . HAND SURGERY     Right  . LUMBAR LAMINECTOMY/DECOMPRESSION MICRODISCECTOMY N/A 08/29/2012   Procedure: LUMBAR LAMINECTOMY/DECOMPRESSION MICRODISCECTOMY 1 LEVEL;  Surgeon: Floyce Stakes, MD;  Location: [place] NEURO ORS;  Service: Neurosurgery;  Laterality: N/A;  Lumbar four-five laminectomies  . NECK SURGERY    . PENILE PROSTHESIS IMPLANT    . ROTATOR CUFF REPAIR     Right  . SHOULDER SURGERY    . WRIST SURGERY     Left  . WRIST SURGERY      Allergies  Allergen Reactions  . Aspirin Other (See Comments)    Higher dosage  upsets stomach  . Bayer Aspirin [Aspirin]     Stomach "flares up"    Current Outpatient Medications on File Prior to Visit  Medication Sig Dispense Refill  . acyclovir (ZOVIRAX) 200 MG capsule Take 200 mg by mouth as needed. Reported on 10/06/2015    . aspirin 81 MG EC tablet Take 81 mg by mouth daily.      . cyclobenzaprine (FLEXERIL) 10 MG tablet Take 1 tablet (10 mg total) by mouth 2 (two) times daily as needed for muscle spasms. 20 tablet 0  . glyBURIDE-metformin (GLUCOVANCE) 5-500 MG per tablet Take 2 tablets by mouth 2 (two) times daily with a meal.     . LANTUS SOLOSTAR 100 UNIT/ML SOPN Inject 20 Units into the skin at bedtime.     .  metoprolol (TOPROL-XL) 50 MG 24 hr tablet Take 50 mg by mouth daily.      Marland Kitchen omeprazole (PRILOSEC) 40 MG capsule Take 40 mg by mouth 2 (two) times daily.     Marland Kitchen oxyCODONE (OXY IR/ROXICODONE) 5 MG immediate release tablet Take 5 mg by mouth 3 (three) times daily as needed for moderate pain. For pain    . ramipril (ALTACE) 10 MG capsule Take 10 mg by mouth daily.     . simvastatin (ZOCOR) 20 MG tablet Take 20 mg by mouth daily.    . tamsulosin (FLOMAX) 0.4 MG CAPS capsule Take 0.4 mg by mouth daily.    . traMADol (ULTRAM) 50 MG tablet Take 1 tablet (50 mg total) by mouth every 6 (six) hours as needed. 15 tablet 0  . [DISCONTINUED] bismuth-metronidazole-tetracycline (PLYERA) 140-125-125 MG per capsule Take 3 capsules by mouth 4 (four) times daily -  before meals and at bedtime.     No current facility-administered medications on file prior to visit.         Objective:   Physical Exam Blood pressure (!) 180/82, pulse 68, temperature 97.6 F (36.4 C), height 5\' 10"  (1.778 m), weight 157 lb (71.2 kg). Alert and oriented. Skin warm and dry. Oral mucosa is moist.   . Sclera anicteric, conjunctivae is pink. Thyroid not enlarged. No cervical lymphadenopathy. Lungs clear. Heart regular rate and rhythm.  Abdomen is soft. Bowel sounds are positive. No hepatomegaly. No abdominal masses felt. No tenderness.  No edema to lower extremities.           Assessment & Plan:  GERD. Continue the Omeprazole. OV in 1 year.

## 2017-09-25 NOTE — Patient Instructions (Signed)
OV in 1 year.  

## 2017-10-22 ENCOUNTER — Other Ambulatory Visit (HOSPITAL_COMMUNITY): Payer: Self-pay | Admitting: Family Medicine

## 2017-10-22 ENCOUNTER — Ambulatory Visit (HOSPITAL_COMMUNITY)
Admission: RE | Admit: 2017-10-22 | Discharge: 2017-10-22 | Disposition: A | Discharge status: Home / Self Care | Payer: Medicare HMO | Source: Ambulatory Visit | Attending: Family Medicine | Admitting: Family Medicine

## 2017-10-22 DIAGNOSIS — R1084 Generalized abdominal pain: Secondary | ICD-10-CM | POA: Diagnosis present

## 2017-10-22 DIAGNOSIS — K769 Liver disease, unspecified: Secondary | ICD-10-CM | POA: Insufficient documentation

## 2017-10-22 DIAGNOSIS — I700 Atherosclerosis of aorta: Secondary | ICD-10-CM | POA: Diagnosis not present

## 2017-10-22 MED ORDER — IOPAMIDOL (ISOVUE-300) INJECTION 61%
100.0000 mL | Freq: Once | INTRAVENOUS | Status: AC | PRN
  Administered 2017-10-22: 100 mL via INTRAVENOUS

## 2017-11-16 ENCOUNTER — Emergency Department (HOSPITAL_COMMUNITY): Payer: Medicare HMO

## 2017-11-16 ENCOUNTER — Emergency Department (HOSPITAL_COMMUNITY)
Admission: EM | Admit: 2017-11-16 | Discharge: 2017-11-16 | Disposition: A | Discharge status: Home / Self Care | Payer: Medicare HMO | Attending: Emergency Medicine | Admitting: Emergency Medicine

## 2017-11-16 ENCOUNTER — Other Ambulatory Visit: Payer: Self-pay

## 2017-11-16 ENCOUNTER — Encounter (HOSPITAL_COMMUNITY): Payer: Self-pay | Admitting: Emergency Medicine

## 2017-11-16 DIAGNOSIS — M542 Cervicalgia: Secondary | ICD-10-CM

## 2017-11-16 DIAGNOSIS — E785 Hyperlipidemia, unspecified: Secondary | ICD-10-CM | POA: Insufficient documentation

## 2017-11-16 DIAGNOSIS — I10 Essential (primary) hypertension: Secondary | ICD-10-CM | POA: Diagnosis not present

## 2017-11-16 DIAGNOSIS — E162 Hypoglycemia, unspecified: Secondary | ICD-10-CM

## 2017-11-16 DIAGNOSIS — Z7982 Long term (current) use of aspirin: Secondary | ICD-10-CM | POA: Diagnosis not present

## 2017-11-16 DIAGNOSIS — E11649 Type 2 diabetes mellitus with hypoglycemia without coma: Secondary | ICD-10-CM | POA: Diagnosis not present

## 2017-11-16 DIAGNOSIS — Z794 Long term (current) use of insulin: Secondary | ICD-10-CM | POA: Insufficient documentation

## 2017-11-16 DIAGNOSIS — Z79899 Other long term (current) drug therapy: Secondary | ICD-10-CM | POA: Diagnosis not present

## 2017-11-16 DIAGNOSIS — M5412 Radiculopathy, cervical region: Secondary | ICD-10-CM | POA: Diagnosis not present

## 2017-11-16 DIAGNOSIS — R51 Headache: Secondary | ICD-10-CM | POA: Diagnosis not present

## 2017-11-16 LAB — CBC WITH DIFFERENTIAL/PLATELET
BASOS ABS: 0 10*3/uL (ref 0.0–0.1)
Basophils Relative: 0 %
EOS ABS: 0.1 10*3/uL (ref 0.0–0.7)
Eosinophils Relative: 2 %
HCT: 33 % — ABNORMAL LOW (ref 39.0–52.0)
Hemoglobin: 11.3 g/dL — ABNORMAL LOW (ref 13.0–17.0)
LYMPHS PCT: 40 %
Lymphs Abs: 1.2 10*3/uL (ref 0.7–4.0)
MCH: 31.2 pg (ref 26.0–34.0)
MCHC: 34.2 g/dL (ref 30.0–36.0)
MCV: 91.2 fL (ref 78.0–100.0)
Monocytes Absolute: 0.2 10*3/uL (ref 0.1–1.0)
Monocytes Relative: 7 %
Neutro Abs: 1.6 10*3/uL — ABNORMAL LOW (ref 1.7–7.7)
Neutrophils Relative %: 51 %
Platelets: 157 10*3/uL (ref 150–400)
RBC: 3.62 MIL/uL — AB (ref 4.22–5.81)
RDW: 13.1 % (ref 11.5–15.5)
WBC: 3.1 10*3/uL — AB (ref 4.0–10.5)

## 2017-11-16 LAB — RAPID URINE DRUG SCREEN, HOSP PERFORMED
Amphetamines: NOT DETECTED
Barbiturates: NOT DETECTED
Benzodiazepines: NOT DETECTED
Cocaine: NOT DETECTED
Opiates: NOT DETECTED
Tetrahydrocannabinol: POSITIVE — AB

## 2017-11-16 LAB — COMPREHENSIVE METABOLIC PANEL
ALBUMIN: 3.8 g/dL (ref 3.5–5.0)
ALK PHOS: 33 U/L — AB (ref 38–126)
ALT: 12 U/L — ABNORMAL LOW (ref 17–63)
AST: 18 U/L (ref 15–41)
Anion gap: 9 (ref 5–15)
BILIRUBIN TOTAL: 0.9 mg/dL (ref 0.3–1.2)
BUN: 14 mg/dL (ref 6–20)
CO2: 27 mmol/L (ref 22–32)
Calcium: 9.4 mg/dL (ref 8.9–10.3)
Chloride: 105 mmol/L (ref 101–111)
Creatinine, Ser: 0.91 mg/dL (ref 0.61–1.24)
GFR calc Af Amer: >60 mL/min (ref >60)
GFR calc non Af Amer: >60 mL/min (ref >60)
Glucose, Bld: 67 mg/dL (ref 65–99)
POTASSIUM: 3.8 mmol/L (ref 3.5–5.1)
SODIUM: 141 mmol/L (ref 135–145)
Total Protein: 7.1 g/dL (ref 6.5–8.1)

## 2017-11-16 LAB — URINALYSIS, ROUTINE W REFLEX MICROSCOPIC
Bacteria, UA: NONE SEEN
Bilirubin Urine: NEGATIVE
GLUCOSE, UA: NEGATIVE mg/dL
KETONES UR: NEGATIVE mg/dL
LEUKOCYTES UA: NEGATIVE
Nitrite: NEGATIVE
Protein, ur: NEGATIVE mg/dL
Specific Gravity, Urine: 1.019 (ref 1.005–1.030)
pH: 5 (ref 5.0–8.0)

## 2017-11-16 LAB — CBG MONITORING, ED
GLUCOSE-CAPILLARY: 30 mg/dL — AB (ref 65–99)
GLUCOSE-CAPILLARY: 124 mg/dL — AB (ref 65–99)
GLUCOSE-CAPILLARY: 43 mg/dL — AB (ref 65–99)
Glucose-Capillary: 24 mg/dL — CL (ref 65–99)

## 2017-11-16 LAB — ETHANOL

## 2017-11-16 MED ORDER — DEXTROSE 50 % IV SOLN
50.0000 mL | Freq: Once | INTRAVENOUS | Status: AC
  Administered 2017-11-16: 50 mL via INTRAVENOUS

## 2017-11-16 MED ORDER — TRAMADOL HCL 50 MG PO TABS: 50.0000 mg | ORAL_TABLET | Freq: Four times a day (QID) | ORAL | 0 refills | Status: DC | PRN

## 2017-11-16 MED ORDER — DEXTROSE 50 % IV SOLN
INTRAVENOUS | Status: AC
  Filled 2017-11-16: qty 50

## 2017-11-16 MED ORDER — ACETAMINOPHEN 325 MG PO TABS
650.0000 mg | ORAL_TABLET | Freq: Once | ORAL | Status: AC
  Administered 2017-11-16: 650 mg via ORAL
  Filled 2017-11-16: qty 2

## 2017-11-16 NOTE — ED Provider Notes (Signed)
[facility] EMERGENCY DEPARTMENT Provider Note   CSN: [id number] Arrival date & time: 11/16/17  1500     History   Chief Complaint Chief Complaint  Patient presents with  . Neck Pain    HPI [name] is a 77 y.o. male.  HPI   He presents for evaluation of discomfort in his head and right arm which started suddenly, 2 days ago when he was hanging a shade on a shelf.  He had very brief transient sharp pain in the right side of his head behind his ear, and then noticed his right elbow was stiff.  The pain behind his hand has waxed and waned since that time.  This morning he felt like he was having trouble hearing and that "there was a cold cloth on my head."  The symptoms were self resolved.  He denies fever, chills, blurred vision or diplopia.  He denies chest pain, shortness of breath, paresthesia or weakness.  He is taking his usual medications.  There are no other known modifying factors.   Past Medical History:  Diagnosis Date  . Arthritis    fingers  . Chronic back pain   . Diabetes mellitus Since 1995   Takes Glucovance and Onglyza daily  . Diabetes mellitus without complication ([place])   . GERD (gastroesophageal reflux disease)    takes Dexilant and Omeprazole daily  . Herpes   . History of colon polyps   . History of gastric ulcer 25+yrs ago  . Hyperlipidemia Since 1995   takes Simvastatin daily  . Hypertension Since 1995   takes Metoprolol and Ramipril daily  . Hypertension   . Pneumonia    hx of;as a child  . Sleep apnea    doesn't use CPAP  . Urinary frequency   . Urinary urgency    takes Flomax daily    Patient Active Problem List   Diagnosis Date Noted  . 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 GERD (gastroesophageal reflux disease) 09/11/2011  . High cholesterol 09/11/2011  . Bronchitis 09/11/2011  . DM 02/13/2010  . ESSENTIAL HYPERTENSION, BENIGN 02/13/2010  . PRECORDIAL PAIN 02/13/2010    Past Surgical History:  Procedure Laterality Date   . BACK SURGERY  1962   Lumbar  . BACK SURGERY    . bilateral cataract surgery    . CARDIAC CATHETERIZATION  02/16/10  . CERVICAL SPINE SURGERY    . COLONOSCOPY    . COLONOSCOPY N/A 04/23/2013   Procedure: COLONOSCOPY;  Surgeon: Rogene Houston, MD;  Location: [facility] ENDO [facility]ITE;  Service: Endoscopy;  Laterality: N/A;  1030  . ESOPHAGOGASTRODUODENOSCOPY N/A 12/24/2014   Procedure: ESOPHAGOGASTRODUODENOSCOPY (EGD);  Surgeon: Rogene Houston, MD;  Location: [facility] ENDO SUITE;  Service: Endoscopy;  Laterality: N/A;  2:45  . ESOPHAGOGASTRODUODENOSCOPY (EGD) WITH ESOPHAGEAL DILATION    . HAND SURGERY     Right  . LUMBAR LAMINECTOMY/DECOMPRESSION MICRODISCECTOMY N/A 08/29/2012   Procedure: LUMBAR LAMINECTOMY/DECOMPRESSION MICRODISCECTOMY 1 LEVEL;  Surgeon: Floyce Stakes, MD;  Location: [place] [facility] ORS;  Service: Neurosurgery;  Laterality: N/A;  Lumbar four-five laminectomies  . NECK SURGERY    . PENILE PROSTHESIS IMPLANT    . ROTATOR CUFF REPAIR     Right  . SHOULDER SURGERY    . WRIST SURGERY     Left  . WRIST SURGERY          Home Medications    Prior to Admission medications   Medication Sig Start Date End Date Taking? Authorizing Provider  acyclovir (  ZOVIRAX) 200 MG capsule Take 200 mg by mouth as needed. Reported on 10/06/2015 01/07/14   [provider]  aspirin 81 MG EC tablet Take 81 mg by mouth daily.      [provider]  cyclobenzaprine (FLEXERIL) 10 MG tablet Take 1 tablet (10 mg total) by mouth 2 (two) times daily as needed for muscle spasms. 04/24/17   Domenic Moras, PA-C  glyBURIDE-metformin (GLUCOVANCE) 5-500 MG per tablet Take 2 tablets by mouth 2 (two) times daily with a meal.     [provider]  LANTUS SOLOSTAR 100 UNIT/ML SOPN Inject 20 Units into the skin at bedtime.  04/15/13   [provider]  metoprolol (TOPROL-XL) 50 MG 24 hr tablet Take 50 mg by mouth daily.      [provider]  omeprazole (PRILOSEC) 40 MG capsule Take 40 mg by  mouth 2 (two) times daily.     [provider]  oxyCODONE (OXY IR/ROXICODONE) 5 MG immediate release tablet Take 5 mg by mouth 3 (three) times daily as needed for moderate pain. For pain 06/16/13   [provider]  ramipril (ALTACE) 10 MG capsule Take 10 mg by mouth daily.  02/05/14   [provider]  simvastatin (ZOCOR) 20 MG tablet Take 20 mg by mouth daily. 04/15/13   [provider]  tamsulosin (FLOMAX) 0.4 MG CAPS capsule Take 0.4 mg by mouth daily.    [provider]  traMADol (ULTRAM) 50 MG tablet Take 1 tablet (50 mg total) by mouth every 6 (six) hours as needed for moderate pain. 11/16/17   Daleen Bo, MD  bismuth-metronidazole-tetracycline [facility]) [phone] MG per capsule Take 3 capsules by mouth 4 (four) times daily -  before meals and at bedtime.  09/26/11  [provider]    Family History Family History  Problem Relation Age of Onset  . Heart attack Father 19       MI  . Hypertension Father   . Heart attack Brother 2       MI  . Diabetes Mother   . Heart attack Paternal Uncle     Social History Social History   Tobacco Use  . Smoking status: Never Smoker  . Smokeless tobacco: Never Used  Substance Use Topics  . Alcohol use: Yes    Comment: occasionally beer  . Drug use: Yes    Frequency: 3.0 times per week    Types: Marijuana    Comment: last use 09/19/2017     Allergies   Aspirin and Bayer aspirin [aspirin]   Review of Systems Review of Systems  All other systems reviewed and are negative.    Physical Exam Updated Vital Signs BP (!) 178/87   Pulse (!) 57   Temp (!) 97.4 F (36.3 C)   Resp 16   Ht 5\' 10"  (1.778 m)   Wt 72.6 kg (160 lb)   SpO2 100%   BMI 22.96 kg/m   Physical Exam  Constitutional: He is oriented to person, place, and time. He appears well-developed and well-nourished. No distress.  HENT:  Head: Normocephalic and atraumatic.  Right Ear: External ear normal.  Left Ear:  External ear normal.  Eyes: Pupils are equal, round, and reactive to light. Conjunctivae and EOM are normal.  Neck: Normal range of motion and phonation normal. Neck supple.  Cardiovascular: Normal rate, regular rhythm and normal heart sounds.  Pulmonary/Chest: Effort normal and breath sounds normal. No respiratory distress. He exhibits no bony tenderness.  Abdominal: Soft.  There is no tenderness.  Musculoskeletal: Normal range of motion.  Normal range of motion neck, back, arms and legs.  Mild pain in lower neck with flexion.  Neurological: He is alert and oriented to person, place, and time. No cranial nerve deficit or sensory deficit. He exhibits normal muscle tone. Coordination normal.  No dysarthria, aphasia or nystagmus.  No ataxia or pronator drift.  Skin: Skin is warm, dry and intact.  Psychiatric: He has a normal mood and affect. His behavior is normal. Judgment and thought content normal.  Nursing note and vitals reviewed.    ED Treatments / Results  Labs (all labs ordered are listed, but only abnormal results are displayed) Labs Reviewed  COMPREHENSIVE METABOLIC PANEL - Abnormal; Notable for the following components:      Result Value   ALT 12 (*)    Alkaline Phosphatase 33 (*)    All other components within normal limits  CBC WITH DIFFERENTIAL/PLATELET - Abnormal; Notable for the following components:   WBC 3.1 (*)    RBC 3.62 (*)    Hemoglobin 11.3 (*)    HCT 33.0 (*)    Neutro Abs 1.6 (*)    All other components within normal limits  URINALYSIS, ROUTINE W REFLEX MICROSCOPIC - Abnormal; Notable for the following components:   Hgb urine dipstick SMALL (*)    All other components within normal limits  RAPID URINE DRUG SCREEN, HOSP PERFORMED - Abnormal; Notable for the following components:   Tetrahydrocannabinol POSITIVE (*)    All other components within normal limits  CBG MONITORING, ED - Abnormal; Notable for the following components:   Glucose-Capillary 30 (*)      All other components within normal limits  CBG MONITORING, ED - Abnormal; Notable for the following components:   Glucose-Capillary 24 (*)    All other components within normal limits  CBG MONITORING, ED - Abnormal; Notable for the following components:   Glucose-Capillary 43 (*)    All other components within normal limits  CBG MONITORING, ED - Abnormal; Notable for the following components:   Glucose-Capillary 124 (*)    All other components within normal limits  ETHANOL    EKG EKG Interpretation  Date/Time:  Saturday Nov 16 2017 17:46:04 EDT Ventricular Rate:  61 PR Interval:    QRS Duration: 100 QT Interval:  426 QTC Calculation: 430 R Axis:   26 Text Interpretation:  Sinus rhythm Atrial premature complex since last tracing no significant change Confirmed by Daleen Bo [phone]) on 11/16/2017 8:10:41 PM   Radiology Ct Head Wo Contrast  Result Date: 11/16/2017 CLINICAL DATA:  Right-sided headache and neck pain, initial encounter EXAM: CT HEAD WITHOUT CONTRAST CT CERVICAL SPINE WITHOUT CONTRAST TECHNIQUE: Multidetector CT imaging of the head and cervical spine was performed following the standard protocol without intravenous contrast. Multiplanar CT image reconstructions of the cervical spine were also generated. COMPARISON:  04/24/2017 CT of the cervical spine, 04/11/2014 CT of the head FINDINGS: CT HEAD FINDINGS Brain: No evidence of acute infarction, hemorrhage, hydrocephalus, extra-axial collection or mass lesion/mass effect. Vascular: No hyperdense vessel or unexpected calcification. Skull: Normal. Negative for fracture or focal lesion. Sinuses/Orbits: No acute finding. Other: None. CT CERVICAL SPINE FINDINGS Alignment: Within normal limits. Skull base and vertebrae: 7 cervical segments are well visualized. Vertebral body height is well maintained. Multilevel disc space narrowing is noted from C3 to C7 as well as at T1-T2. Findings of prior fusion at C7-T1 are seen.  Significant osteophytic changes are noted throughout the  cervical spine with associated facet hypertrophic changes. No acute fracture or acute facet abnormality is noted. Soft tissues and spinal canal: Soft tissues as visualized are within normal limits. Mild carotid calcifications are seen. Upper chest: Visualized upper chest is within normal limits. Other: None IMPRESSION: CT of the head: Normal head CT. CT of the cervical spine: Multilevel degenerative changes similar to that seen on the prior exam. Stable fusion at C7-T1 is seen. Electronically Signed   By: Inez Catalina [name]   On: 11/16/2017 17:53   Ct Cervical Spine Wo Contrast  Result Date: 11/16/2017 CLINICAL DATA:  Right-sided headache and neck pain, initial encounter EXAM: CT HEAD WITHOUT CONTRAST CT CERVICAL SPINE WITHOUT CONTRAST TECHNIQUE: Multidetector CT imaging of the head and cervical spine was performed following the standard protocol without intravenous contrast. Multiplanar CT image reconstructions of the cervical spine were also generated. COMPARISON:  04/24/2017 CT of the cervical spine, 04/11/2014 CT of the head FINDINGS: CT HEAD FINDINGS Brain: No evidence of acute infarction, hemorrhage, hydrocephalus, extra-axial collection or mass lesion/mass effect. Vascular: No hyperdense vessel or unexpected calcification. Skull: Normal. Negative for fracture or focal lesion. Sinuses/Orbits: No acute finding. Other: None. CT CERVICAL SPINE FINDINGS Alignment: Within normal limits. Skull base and vertebrae: 7 cervical segments are well visualized. Vertebral body height is well maintained. Multilevel disc space narrowing is noted from C3 to C7 as well as at T1-T2. Findings of prior fusion at C7-T1 are seen. Significant osteophytic changes are noted throughout the cervical spine with associated facet hypertrophic changes. No acute fracture or acute facet abnormality is noted. Soft tissues and spinal canal: Soft tissues as visualized are within normal  limits. Mild carotid calcifications are seen. Upper chest: Visualized upper chest is within normal limits. Other: None IMPRESSION: CT of the head: Normal head CT. CT of the cervical spine: Multilevel degenerative changes similar to that seen on the prior exam. Stable fusion at C7-T1 is seen. Electronically Signed   By: Inez Catalina [name]   On: 11/16/2017 17:53    Procedures .Critical Care Performed by: Daleen Bo, MD Authorized by: Daleen Bo, MD   Critical care provider statement:    Critical care time (minutes):  35   Critical care start time:  11/16/2017 3:28 PM   Critical care end time:  11/16/2017 9:42 PM   Critical care time was exclusive of:  Separately billable procedures and treating other patients   Critical care was necessary to treat or prevent imminent or life-threatening deterioration of the following conditions:  Endocrine crisis   Critical care was time spent personally by me on the following activities:  Blood draw for specimens, development of treatment plan with patient or surrogate, discussions with consultants, evaluation of patient's response to treatment, examination of patient, obtaining history from patient or surrogate, ordering and performing treatments and interventions, ordering and review of laboratory studies, pulse oximetry, re-evaluation of patient's condition, review of old charts and ordering and review of radiographic studies   (including critical care time)  Medications Ordered in ED Medications  acetaminophen (TYLENOL) tablet 650 mg (has no administration in time range)  dextrose 50 % solution 50 mL (50 mLs Intravenous Given 11/16/17 2052)     Initial Impression / Assessment and Plan / ED Course  I have reviewed the triage vital signs and the nursing notes.  Pertinent labs & imaging results that were available during my care of the patient were reviewed by me and considered in my medical decision making (see chart for details).  Clinical Course  as of Nov 16 2140  Sat Nov 16, 2017  1911 Abnormal, THC present  Urine rapid drug screen (hosp performed)(!) [EW]  1911 Normal  Comprehensive metabolic panel(!) [EW]  4627 Abnormal, white blood cell count and hemoglobin mildly low  CBC with Differential(!) [EW]  1911 No acute disease  CT Head Wo Contrast [EW]  1911 Significant degenerative joint disease, images reviewed  CT Cervical Spine Wo Contrast [EW]  2139 Mild elevation  CBG monitoring, ED(!) [EW]    Clinical Course User Index [EW] Daleen Bo, MD     Patient Vitals for the past 24 hrs:  BP Temp Temp src Pulse Resp SpO2 Height Weight  11/16/17 2030 (!) 178/87 - - (!) 57 16 100 % - -  11/16/17 2000 (!) 165/78 - - 62 18 100 % - -  11/16/17 1930 (!) 146/76 - - (!) 58 18 100 % - -  11/16/17 1913 (!) 151/77 - - 97 15 100 % - -  11/16/17 1912 - - - 97 - - - -  11/16/17 1900 (!) 151/77 - - 62 15 100 % - -  11/16/17 1830 (!) 151/82 - - 60 19 100 % - -  11/16/17 1804 - (!) 97.4 F (36.3 C) - - - - - -  11/16/17 1803 - (!) 97.4 F (36.3 C) - - - - - -  11/16/17 1800 (!) 161/79 - - 65 14 100 % - -  11/16/17 1507 (!) 190/87 (!) 97.4 F (36.3 C) Oral 67 14 100 % 5\' 10"  (1.778 m) 72.6 kg (160 lb)    7:12 PM Reevaluation with update and discussion. After initial assessment and treatment, an updated evaluation reveals he complains of feeling sweaty.  CBG ordered to recheck glucose.  Pulmonary findings discussed with the patient. Daleen Bo   9:40 PM-is alert now, family members here findings discussed and questions answered.  He is agreeable to the plan as outlined below.  Medical Decision Making: Nonspecific neck pain with headache likely secondary to cervical degenerative changes with radiculopathy.  No evidence of acute intracranial abnormality.  Doubt serious bacterial infection or metabolic instability.  CRITICAL CARE-yes Performed by: Daleen Bo   Nursing Notes Reviewed/ Care Coordinated Applicable Imaging  Reviewed Interpretation of Laboratory Data incorporated into ED treatment  The patient appears reasonably screened and/or stabilized for discharge and I doubt any other medical condition or other [facility] requiring further screening, evaluation, or treatment in the ED at this time prior to discharge.  Plan: Home Medications-continue current medications; Home Treatments-rest, fluids; return here if the recommended treatment, does not improve the symptoms; Recommended follow up-PCP, as needed and 1 week for follow-up care.     Final Clinical Impressions(s) / ED Diagnoses   Final diagnoses:  Neck pain  Cervical radiculopathy  Hypoglycemia    ED Discharge Orders        Ordered    traMADol (ULTRAM) 50 MG tablet  Every 6 hours PRN     11/16/17 2142       Daleen Bo, MD 11/16/17 2143

## 2017-11-16 NOTE — ED Triage Notes (Signed)
Pt states on Friday he was reaching to put something up when he felt a burning sensation on the RT side of his neck. States pain radiates to RT arm and to RT side of head. States, "it feels like nerve pain." Also reports some blurry vision in RT eye since incident.

## 2017-11-16 NOTE — ED Notes (Signed)
Date and time results received: 11/16/17 8:45 PM    Test: CBG Critical Value: 43mg /dL  Name of Provider Notified: Dr Eulis Foster  Orders Received? Or Actions Taken?: MD notified

## 2017-11-16 NOTE — ED Notes (Signed)
Notify Dr.Wentz and nurse Jovita Kussmaul of pt CBG of 30mg 

## 2017-11-16 NOTE — ED Notes (Signed)
Pt tolerates fluids well

## 2017-11-16 NOTE — Discharge Instructions (Signed)
To treat the pain on the neck we are prescribing a mild pain reliever.  Also take ibuprofen 400 mg 3 times a day with meals.  Try using heat on the sore area 3 or 4 times a day as well.

## 2017-11-16 NOTE — ED Notes (Addendum)
Date and time results received: 11/16/17 8:15 PM    Test: CBG Critical Value: 24  Name of Provider Notified: Dr Eulis Foster  Orders Received? Or Actions Taken?: MD notified

## 2017-11-16 NOTE — ED Notes (Signed)
Pt given 8 peanut butter crackers and 4oz orange juice, will recheck BGL in 15 mins. Pt states he 'feels fine but is sweaty'

## 2018-03-31 ENCOUNTER — Encounter (INDEPENDENT_AMBULATORY_CARE_PROVIDER_SITE_OTHER): Payer: Self-pay | Admitting: *Deleted

## 2018-04-18 ENCOUNTER — Other Ambulatory Visit (INDEPENDENT_AMBULATORY_CARE_PROVIDER_SITE_OTHER): Payer: Self-pay | Admitting: *Deleted

## 2018-04-18 DIAGNOSIS — Z86010 Personal history of colonic polyps: Secondary | ICD-10-CM

## 2018-04-24 DIAGNOSIS — Z86010 Personal history of colonic polyps: Secondary | ICD-10-CM | POA: Insufficient documentation

## 2018-05-21 ENCOUNTER — Encounter (HOSPITAL_COMMUNITY): Payer: Self-pay | Admitting: Emergency Medicine

## 2018-05-21 ENCOUNTER — Other Ambulatory Visit: Payer: Self-pay

## 2018-05-21 ENCOUNTER — Emergency Department (HOSPITAL_COMMUNITY)
Admission: EM | Admit: 2018-05-21 | Discharge: 2018-05-22 | Disposition: A | Discharge status: Home / Self Care | Payer: Medicare HMO | Attending: Emergency Medicine | Admitting: Emergency Medicine

## 2018-05-21 DIAGNOSIS — Z7982 Long term (current) use of aspirin: Secondary | ICD-10-CM | POA: Insufficient documentation

## 2018-05-21 DIAGNOSIS — Y999 Unspecified external cause status: Secondary | ICD-10-CM | POA: Insufficient documentation

## 2018-05-21 DIAGNOSIS — Y93E5 Activity, floor mopping and cleaning: Secondary | ICD-10-CM | POA: Diagnosis not present

## 2018-05-21 DIAGNOSIS — W228XXA Striking against or struck by other objects, initial encounter: Secondary | ICD-10-CM | POA: Diagnosis not present

## 2018-05-21 DIAGNOSIS — Z7984 Long term (current) use of oral hypoglycemic drugs: Secondary | ICD-10-CM | POA: Insufficient documentation

## 2018-05-21 DIAGNOSIS — I10 Essential (primary) hypertension: Secondary | ICD-10-CM | POA: Insufficient documentation

## 2018-05-21 DIAGNOSIS — Z79899 Other long term (current) drug therapy: Secondary | ICD-10-CM | POA: Insufficient documentation

## 2018-05-21 DIAGNOSIS — E119 Type 2 diabetes mellitus without complications: Secondary | ICD-10-CM | POA: Insufficient documentation

## 2018-05-21 DIAGNOSIS — S0592XA Unspecified injury of left eye and orbit, initial encounter: Secondary | ICD-10-CM | POA: Insufficient documentation

## 2018-05-21 DIAGNOSIS — Y929 Unspecified place or not applicable: Secondary | ICD-10-CM | POA: Diagnosis not present

## 2018-05-21 DIAGNOSIS — S0990XA Unspecified injury of head, initial encounter: Secondary | ICD-10-CM | POA: Diagnosis present

## 2018-05-21 MED ORDER — FLUORESCEIN SODIUM 1 MG OP STRP
1.0000 | ORAL_STRIP | Freq: Once | OPHTHALMIC | Status: AC
  Administered 2018-05-22: 1 via OPHTHALMIC
  Filled 2018-05-21: qty 1

## 2018-05-21 MED ORDER — ERYTHROMYCIN 5 MG/GM OP OINT
TOPICAL_OINTMENT | Freq: Three times a day (TID) | OPHTHALMIC | Status: DC
  Administered 2018-05-22: 1 via OPHTHALMIC
  Filled 2018-05-21: qty 3.5

## 2018-05-21 MED ORDER — TETRACAINE HCL 0.5 % OP SOLN
2.0000 [drp] | Freq: Once | OPHTHALMIC | Status: AC
  Administered 2018-05-22: 2 [drp] via OPHTHALMIC
  Filled 2018-05-21: qty 4

## 2018-05-21 NOTE — ED Provider Notes (Signed)
[facility] EMERGENCY DEPARTMENT Provider Note   CSN: [id number] Arrival date & time: 05/21/18  2213     History   Chief Complaint Chief Complaint  Patient presents with  . Eye Injury    HPI [name] is a 78 y.o. male.  The history is provided by the patient.  Eye Injury  This is a new problem. The current episode started 3 to 5 hours ago. The problem occurs constantly. The problem has not changed since onset.Pertinent negatives include no headaches. Nothing aggravates the symptoms. Nothing relieves the symptoms.  Patient reports he may have gotten some trash in the left eye when he was cleaning.  No significant pain.  Reports some blurred vision.  He does not wear contact lenses.  No other acute complaints  Past Medical History:  Diagnosis Date  . Arthritis    fingers  . Chronic back pain   . Diabetes mellitus Since 1995   Takes Glucovance and Onglyza daily  . Diabetes mellitus without complication ([place])   . GERD (gastroesophageal reflux disease)    takes Dexilant and Omeprazole daily  . Herpes   . History of colon polyps   . History of gastric ulcer 25+yrs ago  . Hyperlipidemia Since 1995   takes Simvastatin daily  . Hypertension Since 1995   takes Metoprolol and Ramipril daily  . Hypertension   . Pneumonia    hx of;as a child  . Sleep apnea    doesn't use CPAP  . Urinary frequency   . Urinary urgency    takes Flomax daily    Patient Active Problem List   Diagnosis Date Noted  . History of colonic polyps 04/24/2018  . 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 GERD (gastroesophageal reflux disease) 09/11/2011  . High cholesterol 09/11/2011  . Bronchitis 09/11/2011  . DM 02/13/2010  . ESSENTIAL HYPERTENSION, BENIGN 02/13/2010  . PRECORDIAL PAIN 02/13/2010    Past Surgical History:  Procedure Laterality Date  . BACK SURGERY  1962   Lumbar  . BACK SURGERY    . bilateral cataract surgery    . CARDIAC CATHETERIZATION  02/16/10  . CERVICAL SPINE  SURGERY    . COLONOSCOPY    . COLONOSCOPY N/A 04/23/2013   Procedure: COLONOSCOPY;  Surgeon: Rogene Houston, MD;  Location: [facility];  Service: Endoscopy;  Laterality: N/A;  1030  . ESOPHAGOGASTRODUODENOSCOPY N/A 12/24/2014   Procedure: ESOPHAGOGASTRODUODENOSCOPY (EGD);  Surgeon: Rogene Houston, MD;  Location: [facility] ENDO SUITE;  Service: Endoscopy;  Laterality: N/A;  2:45  . ESOPHAGOGASTRODUODENOSCOPY (EGD) WITH ESOPHAGEAL DILATION    . HAND SURGERY     Right  . LUMBAR LAMINECTOMY/DECOMPRESSION MICRODISCECTOMY N/A 08/29/2012   Procedure: LUMBAR LAMINECTOMY/DECOMPRESSION MICRODISCECTOMY 1 LEVEL;  Surgeon: Floyce Stakes, MD;  Location: [place] [facility]UR[facility] ORS;  Service: Neurosurgery;  Laterality: N/A;  Lumbar four-five laminectomies  . NECK SURGERY    . PENILE PROSTHESIS IMPLANT    . ROTATOR CUFF REPAIR     Right  . SHOULDER SURGERY    . WRIST SURGERY     Left  . WRIST SURGERY          Home Medications    Prior to Admission medications   Medication Sig Start Date End Date Taking? Authorizing Provider  aspirin 81 MG EC tablet Take 81 mg by mouth daily.     Yes [provider]  glyBURIDE-metformin (GLUCOVANCE) 5-500 MG per tablet Take 2 tablets by mouth 2 (two) times daily with a meal.  Yes [provider]  metoprolol (TOPROL-XL) 50 MG 24 hr tablet Take 50 mg by mouth daily.     Yes [provider]  omeprazole (PRILOSEC) 40 MG capsule Take 40 mg by mouth 2 (two) times daily.    Yes [provider]  oxyCODONE (OXY IR/ROXICODONE) 5 MG immediate release tablet Take 5 mg by mouth 3 (three) times daily as needed for moderate pain. For pain 06/16/13  Yes [provider]  ramipril (ALTACE) 10 MG capsule Take 10 mg by mouth daily.  02/05/14  Yes [provider]  simvastatin (ZOCOR) 20 MG tablet Take 20 mg by mouth daily. 04/15/13  Yes [provider]  sulfamethoxazole-trimethoprim (BACTRIM DS,SEPTRA DS) 800-160 MG tablet Take 1 tablet by  mouth 2 (two) times daily. 7 day course starting 05/20/2018 05/19/18  Yes [provider]  tamsulosin (FLOMAX) 0.4 MG CAPS capsule Take 0.4 mg by mouth daily.   Yes [provider]  TRESIBA FLEXTOUCH 200 UNIT/ML SOPN Inject 12 Units into the skin at bedtime.  05/07/18  Yes [provider]  acyclovir (ZOVIRAX) 200 MG capsule Take 200 mg by mouth as needed. Reported on 10/06/2015 01/07/14   [provider]  bismuth-metronidazole-tetracycline (PLYERA) [id number] MG per capsule Take 3 capsules by mouth 4 (four) times daily -  before meals and at bedtime.  09/26/11  [provider]    Family History Family History  Problem Relation Age of Onset  . Heart attack Father 48       MI  . Hypertension Father   . Heart attack Brother 77       MI  . Diabetes Mother   . Heart attack Paternal Uncle     Social History Social History   Tobacco Use  . Smoking status: Never Smoker  . Smokeless tobacco: Never Used  Substance Use Topics  . Alcohol use: Yes    Comment: occasionally beer  . Drug use: Yes    Frequency: 3.0 times per week    Types: Marijuana    Comment: Sunday     Allergies   Aspirin and Bayer aspirin [aspirin]   Review of Systems Review of Systems  Constitutional: Negative for fever.  Eyes: Positive for visual disturbance.  Neurological: Negative for headaches.     Physical Exam Updated Vital Signs BP (!) 176/91 (BP Location: Right Arm)   Pulse 79   Temp 97.8 F (36.6 C) (Oral)   Resp 18   Ht 1.778 m (5\' 10" )   Wt 71.7 kg   SpO2 98%   BMI 22.67 kg/m   Physical Exam CONSTITUTIONAL: Well developed/well nourished HEAD: Normocephalic/atraumatic EYES: EOMI/PERRL, no proptosis, no large abrasions, pupils are regular, small piece of foreign body noted conjunctiva of left eye, no corneal hazing, visual acuity noted Negative Seidel test ENMT: Mucous membranes moist NECK: supple no meningeal signs CV: S1/S2 noted LUNGS: Lungs  are clear to auscultation bilaterally, no apparent distress ABDOMEN: soft NEURO: Pt is awake/alert/appropriate, moves all extremitiesx4.  EXTREMITIES:  full ROM SKIN: warm, color normal PSYCH: no abnormalities of mood noted, alert and oriented to situation   ED Treatments / Results  Labs (all labs ordered are listed, but only abnormal results are displayed) Labs Reviewed - No data to display  EKG None  Radiology No results found.  Procedures Procedures  Medications Ordered in ED Medications  tetracaine (PONTOCAINE) 0.5 % ophthalmic solution 2 drop (has no administration in time range)  fluorescein ophthalmic strip 1 strip (has no administration  in time range)  erythromycin ophthalmic ointment (has no administration in time range)     Initial Impression / Assessment and Plan / ED Course  I have reviewed the triage vital signs and the nursing notes.   I was able to remove a small piece of foreign body in the left eye.  No large abrasions.  No other signs of acute traumatic injury left eye.  Pupils are equal and reactive.  He still has some mild residual blurred vision Will start on erythromycin Refer to opthalmology.    Final Clinical Impressions(s) / ED Diagnoses   Final diagnoses:  Left eye injury, initial encounter    ED Discharge Orders    None       Ripley Fraise, MD 05/21/18 2357

## 2018-05-21 NOTE — ED Triage Notes (Addendum)
Patient states he was raking out some trash and something hit him in the L eye.

## 2018-06-11 ENCOUNTER — Encounter (INDEPENDENT_AMBULATORY_CARE_PROVIDER_SITE_OTHER): Payer: Self-pay | Admitting: *Deleted

## 2018-06-11 ENCOUNTER — Telehealth (INDEPENDENT_AMBULATORY_CARE_PROVIDER_SITE_OTHER): Payer: Self-pay | Admitting: *Deleted

## 2018-06-11 NOTE — Telephone Encounter (Signed)
Patient needs trilyte 

## 2018-06-12 MED ORDER — PEG 3350-KCL-NA BICARB-NACL 420 G PO SOLR: 4000.0000 mL | Freq: Once | ORAL | 0 refills | Status: AC

## 2018-06-24 ENCOUNTER — Other Ambulatory Visit (HOSPITAL_COMMUNITY): Payer: Self-pay | Admitting: Family Medicine

## 2018-06-24 ENCOUNTER — Ambulatory Visit (HOSPITAL_COMMUNITY)
Admission: RE | Admit: 2018-06-24 | Discharge: 2018-06-24 | Disposition: A | Discharge status: Home / Self Care | Payer: Medicare HMO | Source: Ambulatory Visit | Attending: Family Medicine | Admitting: Family Medicine

## 2018-06-24 DIAGNOSIS — M545 Low back pain, unspecified: Secondary | ICD-10-CM

## 2018-06-24 DIAGNOSIS — G8929 Other chronic pain: Secondary | ICD-10-CM

## 2018-06-30 ENCOUNTER — Telehealth (INDEPENDENT_AMBULATORY_CARE_PROVIDER_SITE_OTHER): Payer: Self-pay | Admitting: *Deleted

## 2018-06-30 NOTE — Telephone Encounter (Signed)
Referring MD/PCP: dondiego   Procedure: tcs  Reason/Indication:  Hx polyps  Has patient had this procedure before?  Yes, 2014  If so, when, by whom and where?    Is there a family history of colon cancer?  no  Who?  What age when diagnosed?    Is patient diabetic?   yes      Does patient have prosthetic heart valve or mechanical valve?  no  Do you have a pacemaker?  no  Has patient ever had endocarditis? no  Has patient had joint replacement within last 12 months?  no  Is patient constipated or do they take laxatives? no  Does patient have a history of alcohol/drug use?  no  Is patient on blood thinner such as Coumadin, Plavix and/or Aspirin? yes  Medications: asa 81 mg daily, glyburide/metformin 5/500 mg 2 tab bid, naproxen 500 mg bid, metoprolol 50 mg daily, ramipril 10 mg daily, omeprazole 40 mg daily, simvastatin 20 mg daily, pantoprazole 40 mg daily   Allergies: reglan  Medication Adjustment per Dr Lindi Adie, NP: asa 2 days, hold diabetic medicine evening before and morning of  Procedure date & time: 07/24/18 at 830

## 2018-07-03 NOTE — Telephone Encounter (Signed)
agree

## 2018-07-24 ENCOUNTER — Encounter (HOSPITAL_COMMUNITY)
Admission: RE | Disposition: A | Discharge status: Home / Self Care | Payer: Self-pay | Source: Home / Self Care | Attending: Internal Medicine

## 2018-07-24 ENCOUNTER — Ambulatory Visit (HOSPITAL_COMMUNITY)
Admission: RE | Admit: 2018-07-24 | Discharge: 2018-07-24 | Disposition: A | Discharge status: Home / Self Care | Payer: Medicare HMO | Attending: Internal Medicine | Admitting: Internal Medicine

## 2018-07-24 ENCOUNTER — Encounter (HOSPITAL_COMMUNITY): Payer: Self-pay | Admitting: *Deleted

## 2018-07-24 ENCOUNTER — Other Ambulatory Visit: Payer: Self-pay

## 2018-07-24 DIAGNOSIS — E785 Hyperlipidemia, unspecified: Secondary | ICD-10-CM | POA: Insufficient documentation

## 2018-07-24 DIAGNOSIS — Z79899 Other long term (current) drug therapy: Secondary | ICD-10-CM | POA: Insufficient documentation

## 2018-07-24 DIAGNOSIS — Z7982 Long term (current) use of aspirin: Secondary | ICD-10-CM | POA: Diagnosis not present

## 2018-07-24 DIAGNOSIS — M19049 Primary osteoarthritis, unspecified hand: Secondary | ICD-10-CM | POA: Insufficient documentation

## 2018-07-24 DIAGNOSIS — K219 Gastro-esophageal reflux disease without esophagitis: Secondary | ICD-10-CM | POA: Diagnosis not present

## 2018-07-24 DIAGNOSIS — Z8719 Personal history of other diseases of the digestive system: Secondary | ICD-10-CM | POA: Diagnosis not present

## 2018-07-24 DIAGNOSIS — Z794 Long term (current) use of insulin: Secondary | ICD-10-CM | POA: Diagnosis not present

## 2018-07-24 DIAGNOSIS — E119 Type 2 diabetes mellitus without complications: Secondary | ICD-10-CM | POA: Diagnosis not present

## 2018-07-24 DIAGNOSIS — K648 Other hemorrhoids: Secondary | ICD-10-CM | POA: Diagnosis not present

## 2018-07-24 DIAGNOSIS — Z86010 Personal history of colonic polyps: Secondary | ICD-10-CM | POA: Insufficient documentation

## 2018-07-24 DIAGNOSIS — G8929 Other chronic pain: Secondary | ICD-10-CM | POA: Insufficient documentation

## 2018-07-24 DIAGNOSIS — Z8249 Family history of ischemic heart disease and other diseases of the circulatory system: Secondary | ICD-10-CM | POA: Insufficient documentation

## 2018-07-24 DIAGNOSIS — G4730 Sleep apnea, unspecified: Secondary | ICD-10-CM | POA: Insufficient documentation

## 2018-07-24 DIAGNOSIS — Z886 Allergy status to analgesic agent status: Secondary | ICD-10-CM | POA: Insufficient documentation

## 2018-07-24 DIAGNOSIS — Z1211 Encounter for screening for malignant neoplasm of colon: Secondary | ICD-10-CM | POA: Insufficient documentation

## 2018-07-24 DIAGNOSIS — I10 Essential (primary) hypertension: Secondary | ICD-10-CM | POA: Diagnosis not present

## 2018-07-24 DIAGNOSIS — Z09 Encounter for follow-up examination after completed treatment for conditions other than malignant neoplasm: Secondary | ICD-10-CM | POA: Diagnosis not present

## 2018-07-24 HISTORY — PX: COLONOSCOPY: SHX5424

## 2018-07-24 LAB — GLUCOSE, CAPILLARY
Glucose-Capillary: 53 mg/dL — ABNORMAL LOW (ref 70–99)
Glucose-Capillary: 96 mg/dL (ref 70–99)

## 2018-07-24 SURGERY — COLONOSCOPY
Anesthesia: Moderate Sedation

## 2018-07-24 MED ORDER — MEPERIDINE HCL 50 MG/ML IJ SOLN
INTRAMUSCULAR | Status: AC
  Filled 2018-07-24: qty 1

## 2018-07-24 MED ORDER — DEXTROSE 50 % IV SOLN: 25.0000 mL | Freq: Once | INTRAVENOUS | Status: DC

## 2018-07-24 MED ORDER — DEXTROSE 50 % IV SOLN
INTRAVENOUS | Status: AC
  Administered 2018-07-24: 25 mL
  Filled 2018-07-24: qty 50

## 2018-07-24 MED ORDER — SODIUM CHLORIDE 0.9 % IV SOLN
INTRAVENOUS | Status: DC
  Administered 2018-07-24: 08:00:00 via INTRAVENOUS

## 2018-07-24 MED ORDER — MIDAZOLAM HCL 5 MG/5ML IJ SOLN
INTRAMUSCULAR | Status: AC
  Filled 2018-07-24: qty 10

## 2018-07-24 MED ORDER — STERILE WATER FOR IRRIGATION IR SOLN
Status: DC | PRN
  Administered 2018-07-24: 1.5 mL

## 2018-07-24 MED ORDER — MEPERIDINE HCL 50 MG/ML IJ SOLN
INTRAMUSCULAR | Status: DC | PRN
  Administered 2018-07-24 (×2): 25 mg via INTRAVENOUS

## 2018-07-24 MED ORDER — MIDAZOLAM HCL 5 MG/5ML IJ SOLN
INTRAMUSCULAR | Status: DC | PRN
  Administered 2018-07-24: 2 mg via INTRAVENOUS
  Administered 2018-07-24: 1 mg via INTRAVENOUS

## 2018-07-24 NOTE — Discharge Instructions (Signed)
Hemorrhoids Hemorrhoids are swollen veins that may develop:  In the butt (rectum). These are called internal hemorrhoids.  Around the opening of the butt (anus). These are called external hemorrhoids. Hemorrhoids can cause pain, itching, or bleeding. Most of the time, they do not cause serious problems. They usually get better with diet changes, lifestyle changes, and other home treatments. What are the causes? This condition may be caused by:  Having trouble pooping (constipation).  Pushing hard (straining) to poop.  Watery poop (diarrhea).  Pregnancy.  Being very overweight (obese).  Sitting for long periods of time.  Heavy lifting or other activity that causes you to strain.  Anal sex.  Riding a bike for a long period of time. What are the signs or symptoms? Symptoms of this condition include:  Pain.  Itching or soreness in the butt.  Bleeding from the butt.  Leaking poop.  Swelling in the area.  One or more lumps around the opening of your butt. How is this diagnosed? A doctor can often diagnose this condition by looking at the affected area. The doctor may also:  Do an exam that involves feeling the area with a gloved hand (digital rectal exam).  Examine the area inside your butt using a small tube (anoscope).  Order blood tests. This may be done if you have lost a lot of blood.  Have you get a test that involves looking inside the colon using a flexible tube with a camera on the end (sigmoidoscopy or colonoscopy). How is this treated? This condition can usually be treated at home. Your doctor may tell you to change what you eat, make lifestyle changes, or try home treatments. If these do not help, procedures can be done to remove the hemorrhoids or make them smaller. These may involve:  Placing rubber bands at the base of the hemorrhoids to cut off their blood supply.  Injecting medicine into the hemorrhoids to shrink them.  Shining a type of light  energy onto the hemorrhoids to cause them to fall off.  Doing surgery to remove the hemorrhoids or cut off their blood supply. Follow these instructions at home: Eating and drinking   Eat foods that have a lot of fiber in them. These include whole grains, beans, nuts, fruits, and vegetables.  Ask your doctor about taking products that have added fiber (fibersupplements).  Reduce the amount of fat in your diet. You can do this by: ? Eating low-fat dairy products. ? Eating less red meat. ? Avoiding processed foods.  Drink enough fluid to keep your pee (urine) pale yellow. Managing pain and swelling   Take a warm-water bath (sitz bath) for 20 minutes to ease pain. Do this 3-4 times a day. You may do this in a bathtub or using a portable sitz bath that fits over the toilet.  If told, put ice on the painful area. It may be helpful to use ice between your warm baths. ? Put ice in a plastic bag. ? Place a towel between your skin and the bag. ? Leave the ice on for 20 minutes, 2-3 times a day. General instructions  Take over-the-counter and prescription medicines only as told by your doctor. ? Medicated creams and medicines may be used as told.  Exercise often. Ask your doctor how much and what kind of exercise is best for you.  Go to the bathroom when you have the urge to poop. Do not wait.  Avoid pushing too hard when you poop.  Keep your  butt dry and clean. Use wet toilet paper or moist towelettes after pooping.  Do not sit on the toilet for a long time.  Keep all follow-up visits as told by your doctor. This is important. Contact a doctor if you:  Have pain and swelling that do not get better with treatment or medicine.  Have trouble pooping.  Cannot poop.  Have pain or swelling outside the area of the hemorrhoids. Get help right away if you have:  Bleeding that will not stop. Summary  Hemorrhoids are swollen veins in the butt or around the opening of the  butt.  They can cause pain, itching, or bleeding.  Eat foods that have a lot of fiber in them. These include whole grains, beans, nuts, fruits, and vegetables.  Take a warm-water bath (sitz bath) for 20 minutes to ease pain. Do this 3-4 times a day. This information is not intended to replace advice given to you by your health care provider. Make sure you discuss any questions you have with your health care provider. Document Released: 04/03/2008 Document Revised: 11/14/2017 Document Reviewed: 11/14/2017 Elsevier Interactive Patient Education  2019 [place]. Colonoscopy, Adult, Care After This sheet gives you information about how to care for yourself after your procedure. Your health care provider may also give you more specific instructions. If you have problems or questions, contact your health care provider. What can I expect after the procedure? After the procedure, it is common to have:  A small amount of blood in your stool for 24 hours after the procedure.  Some gas.  Mild abdominal cramping or bloating. Follow these instructions at home: General instructions  For the first 24 hours after the procedure: ? Do not drive or use machinery. ? Do not sign important documents. ? Do not drink alcohol. ? Do your regular daily activities at a slower pace than normal. ? Eat soft, easy-to-digest foods.  Take over-the-counter or prescription medicines only as told by your health care provider. Relieving cramping and bloating   Try walking around when you have cramps or feel bloated.  Apply heat to your abdomen as told by your health care provider. Use a heat source that your health care provider recommends, such as a moist heat pack or a heating pad. ? Place a towel between your skin and the heat source. ? Leave the heat on for 20-30 minutes. ? Remove the heat if your skin turns bright red. This is especially important if you are unable to feel pain, heat, or cold. You may have  a greater risk of getting burned. Eating and drinking   Drink enough fluid to keep your urine pale yellow.  Resume your normal diet as instructed by your health care provider. Avoid heavy or fried foods that are hard to digest.  Avoid drinking alcohol for as long as instructed by your health care provider. Contact a health care provider if:  You have blood in your stool 2-3 days after the procedure. Get help right away if:  You have more than a small spotting of blood in your stool.  You pass large blood clots in your stool.  Your abdomen is swollen.  You have nausea or vomiting.  You have a fever.  You have increasing abdominal pain that is not relieved with medicine. Summary  After the procedure, it is common to have a small amount of blood in your stool. You may also have mild abdominal cramping and bloating.  For the first 24 hours after  the procedure, do not drive or use machinery, sign important documents, or drink alcohol.  Contact your health care provider if you have a lot of blood in your stool, nausea or vomiting, a fever, or increased abdominal pain. This information is not intended to replace advice given to you by your health care provider. Make sure you discuss any questions you have with your health care provider. Document Released: 02/07/2004 Document Revised: 04/17/2017 Document Reviewed: 09/06/2015 Elsevier Interactive Patient Education  2019 [place] usual medications including aspirin as before. Resume usual diet. No driving for 24 hours. Office visit in 5 years.

## 2018-07-24 NOTE — Op Note (Signed)
[facility] Patient Name: [name] Procedure Date: 07/24/2018 8:04 AM MRN: [id number] Date of Birth: October 22, 1940 Attending MD: Hildred Laser , MD CSN: [id number] Age: 78 Admit Type: Outpatient Procedure:                Colonoscopy Indications:              High risk colon cancer surveillance: Personal                            history of colonic polyps Providers:                Hildred Laser, MD, Charlsie Quest. Theda Sers RN, RN, Aram Candela Referring MD:             Ralene Bathe. Dondiego, MD Medicines:                Meperidine 50 mg IV, Midazolam 3 mg IV Complications:            No immediate complications. Estimated Blood Loss:     Estimated blood loss: none. Procedure:                Pre-Anesthesia Assessment:                           - Prior to the procedure, a History and Physical                            was performed, and patient medications and                            allergies were reviewed. The patient's tolerance of                            previous anesthesia was also reviewed. The risks                            and benefits of the procedure and the sedation                            options and risks were discussed with the patient.                            All questions were answered, and informed consent                            was obtained. Prior Anticoagulants: The patient                            last took aspirin 3 days prior to the procedure.                            ASA Grade Assessment: III - A patient with severe  systemic disease. After reviewing the risks and                            benefits, the patient was deemed in satisfactory                            condition to undergo the procedure.                           After obtaining informed consent, the colonoscope                            was passed under direct vision. Throughout the                            procedure,  the patient's blood pressure, pulse, and                            oxygen saturations were monitored continuously. The                            [id number]-[id number] ([id number]) scope was introduced through                            the anus and advanced to the the cecum, identified                            by appendiceal orifice and ileocecal valve. The                            colonoscopy was performed without difficulty. The                            patient tolerated the procedure well. The quality                            of the bowel preparation was excellent. The                            ileocecal valve, appendiceal orifice, and rectum                            were photographed. Scope In: 8:34:02 AM Scope Out: 8:46:39 AM Scope Withdrawal Time: 0 hours 7 minutes 15 seconds  Total Procedure Duration: 0 hours 12 minutes 37 seconds  Findings:      The perianal and digital rectal examinations were normal.      The colon (entire examined portion) appeared normal.      Internal hemorrhoids were found during retroflexion. The hemorrhoids       were medium-sized. Impression:               - The entire examined colon is normal.                           - Internal hemorrhoids.                           -  No specimens collected. Moderate Sedation:      Moderate (conscious) sedation was administered by the endoscopy nurse       and supervised by the endoscopist. The following parameters were       monitored: oxygen saturation, heart rate, blood pressure, CO2       capnography and response to care. Total physician intraservice time was       18 minutes. Recommendation:           - Patient has a contact number available for                            emergencies. The signs and symptoms of potential                            delayed complications were discussed with the                            patient. Return to normal activities tomorrow.                            Written discharge  instructions were provided to the                            patient.                           - Resume previous diet today.                           - Continue present medications.                           - Resume aspirin at prior dose today.                           - No recommendation at this time regarding repeat                            colonoscopy.                           - Office visit in 5 years. Procedure Code(s):        --- Professional ---                           [phone], Colonoscopy, flexible; diagnostic, including                            collection of specimen(s) by brushing or washing,                            when performed (separate procedure)                           G0500, Moderate sedation services provided by the  same physician or other qualified health care                            professional performing a gastrointestinal                            endoscopic service that sedation supports,                            requiring the presence of an independent trained                            observer to assist in the monitoring of the                            patient's level of consciousness and physiological                            status; initial 15 minutes of intra-service time;                            patient age 12 years or older (additional time may                            be reported with [phone], as appropriate) Diagnosis Code(s):        --- Professional ---                           Z86.010, Personal history of colonic polyps                           K64.8, Other hemorrhoids CPT copyright 2018 [employer]. All rights reserved. The codes documented in this report are preliminary and upon coder review may  be revised to meet current compliance requirements. Hildred Laser, MD Hildred Laser, MD 07/24/2018 8:54:20 AM This report has been signed electronically. Number of Addenda: 0

## 2018-07-24 NOTE — H&P (Signed)
[name] is an 78 y.o. male.   Chief Complaint: Patient is here for colonoscopy. HPI: [name] is 78 year old African-American male with multiple medical problems as well as history of colonic adenomas who is here for surveillance colonoscopy.  Last exam was in October 2014 with removal of 3 small polyps and these were were tubular adenomas.  He denies abdominal pain change in bowel habits or rectal bleeding.  His main complaint today is 1 of chronic low back pain and he has had intermittent burning pain behind his left knee. Last aspirin dose was 3 days ago. Family history is negative for CRC.  Past Medical History:  Diagnosis Date  . Arthritis    fingers  . Chronic back pain   . Diabetes mellitus Since 1995   Takes Glucovance and Onglyza daily  . Diabetes mellitus without complication ([place])   . GERD (gastroesophageal reflux disease)    takes Dexilant and Omeprazole daily  . Herpes   . History of colon polyps   . History of gastric ulcer 25+yrs ago  . Hyperlipidemia Since 1995   takes Simvastatin daily  . Hypertension Since 1995   takes Metoprolol and Ramipril daily  . Hypertension   . Pneumonia    hx of;as a child  . Sleep apnea    doesn't use CPAP  . Urinary frequency   . Urinary urgency    takes Flomax daily    Past Surgical History:  Procedure Laterality Date  . BACK SURGERY  1962   Lumbar  . BACK SURGERY    . bilateral cataract surgery    . CARDIAC CATHETERIZATION  02/16/10  . CERVICAL SPINE SURGERY    . COLONOSCOPY    . COLONOSCOPY N/A 04/23/2013   Procedure: COLONOSCOPY;  Surgeon: Rogene Houston, MD;  [facility];  Service: Endoscopy;  Laterality: N/A;  1030  . ESOPHAGOGASTRODUODENOSCOPY N/A 12/24/2014   Procedure: ESOPHAGOGASTRODUODENOSCOPY (EGD);  Surgeon: Rogene Houston, MD;  [facility];  Service: Endoscopy;  Laterality: N/A;  2:45  . ESOPHAGOGASTRODUODENOSCOPY (EGD) WITH ESOPHAGEAL DILATION    . HAND SURGERY     Right  .  LUMBAR LAMINECTOMY/DECOMPRESSION MICRODISCECTOMY N/A 08/29/2012   Procedure: LUMBAR LAMINECTOMY/DECOMPRESSION MICRODISCECTOMY 1 LEVEL;  Surgeon: Floyce Stakes, MD;  Location: [facility];  Service: Neurosurgery;  Laterality: N/A;  Lumbar four-five laminectomies  . NECK SURGERY    . PENILE PROSTHESIS IMPLANT    . ROTATOR CUFF REPAIR     Right  . SHOULDER SURGERY    . WRIST SURGERY     Left  . WRIST SURGERY      Family History  Problem Relation Age of Onset  . Heart attack Father 55       MI  . Hypertension Father   . Heart attack Brother 17       MI  . Diabetes Mother   . Heart attack Paternal Uncle    Social History:  reports that he has never smoked. He has never used smokeless tobacco. He reports current alcohol use. He reports current drug use. Frequency: 3.00 times per week. Drug: Marijuana.  Allergies:  Allergies  Allergen Reactions  . Aspirin Other (See Comments)    Higher dosage upsets stomach  . Bayer Aspirin [Aspirin] Other (See Comments)    Stomach "flares up"    Medications Prior to Admission  Medication Sig Dispense Refill  . aspirin 81 MG EC tablet Take 81 mg by mouth daily.      Marland Kitchen  glyBURIDE-metformin (GLUCOVANCE) 5-500 MG per tablet Take 2 tablets by mouth 2 (two) times daily with a meal.     . metoprolol (TOPROL-XL) 50 MG 24 hr tablet Take 50 mg by mouth daily.      Marland Kitchen omeprazole (PRILOSEC) 40 MG capsule Take 40 mg by mouth 2 (two) times daily.     Marland Kitchen oxyCODONE (OXY IR/ROXICODONE) 5 MG immediate release tablet Take 5 mg by mouth 3 (three) times daily as needed for moderate pain. For pain    . ramipril (ALTACE) 10 MG capsule Take 10 mg by mouth daily.     . simvastatin (ZOCOR) 20 MG tablet Take 20 mg by mouth daily.    Marland Kitchen sulfamethoxazole-trimethoprim (BACTRIM DS,SEPTRA DS) 800-160 MG tablet Take 1 tablet by mouth 2 (two) times daily. 7 day course starting 05/20/2018  0  . tamsulosin (FLOMAX) 0.4 MG CAPS capsule Take 0.4 mg by mouth daily.    . TRESIBA  FLEXTOUCH 200 UNIT/ML SOPN Inject 12 Units into the skin at bedtime.   4  . acyclovir (ZOVIRAX) 200 MG capsule Take 200 mg by mouth as needed.       Results for orders placed or performed during the hospital encounter of 07/24/18 (from the past 48 hour(s))  Glucose, capillary     Status: Abnormal   Collection Time: 07/24/18  7:59 AM  Result Value Ref Range   Glucose-Capillary 53 (L) 70 - 99 mg/dL   No results found.  ROS  Blood pressure (!) 164/74, pulse 84, temperature 97.8 F (36.6 C), temperature source Oral, resp. rate 17, height 5\' 10"  (1.778 m), weight 75.3 kg, SpO2 99 %. Physical Exam  Constitutional:  Well-developed thin African-American male in NAD.  HENT:  Mouth/Throat: Oropharynx is clear and moist.  Eyes: Conjunctivae are normal. No scleral icterus.  Neck: No thyromegaly present.  Cardiovascular: Normal rate, regular rhythm and normal heart sounds.  No murmur heard. Respiratory: Effort normal and breath sounds normal.  GI:  Abdomen is flat soft and nontender with organomegaly or masses.  Musculoskeletal:        General: No edema.  Lymphadenopathy:    He has no cervical adenopathy.  Neurological: He is alert.  Skin: Skin is warm and dry.     Assessment/Plan History of colonic adenomas. Surveillance colonoscopy.  Hildred Laser, MD 07/24/2018, 8:24 AM

## 2018-07-28 ENCOUNTER — Encounter (HOSPITAL_COMMUNITY): Payer: Self-pay | Admitting: Internal Medicine

## 2018-09-17 ENCOUNTER — Other Ambulatory Visit: Payer: Self-pay

## 2018-09-17 ENCOUNTER — Encounter (INDEPENDENT_AMBULATORY_CARE_PROVIDER_SITE_OTHER): Payer: Self-pay | Admitting: Internal Medicine

## 2018-09-17 ENCOUNTER — Ambulatory Visit (INDEPENDENT_AMBULATORY_CARE_PROVIDER_SITE_OTHER): Payer: Medicare HMO | Admitting: Internal Medicine

## 2018-09-17 VITALS — BP 171/85 | HR 76 | Temp 98.1°F | Ht 70.0 in | Wt 162.0 lb

## 2018-09-17 DIAGNOSIS — K219 Gastro-esophageal reflux disease without esophagitis: Secondary | ICD-10-CM | POA: Diagnosis not present

## 2018-09-17 MED ORDER — PANTOPRAZOLE SODIUM 40 MG PO TBEC: 40.0000 mg | DELAYED_RELEASE_TABLET | Freq: Every day | ORAL | 11 refills | Status: DC

## 2018-09-17 NOTE — Patient Instructions (Signed)
Rx for Protonix sent to his pharmacy 

## 2018-09-17 NOTE — Progress Notes (Addendum)
Subjective:    Patient ID: [name], male    DOB: 04-15-1941, 78 y.o.   MRN: [id number]  HPI Presents today with c/o abdominal pain. States last Monday, he got up and vomited. He was nauseated all day long.  He would become dizzy if he bent over. He states today he feels okay. He says sometimes he feels burning in his epigastric region. He says in the morning his mouth is bitter. He feels 100% better. There was no fever associated with his symptoms.  Appetite is okay. No weight loss.  BMs moving okay.    Hx of hypertension, gastric ulcer, DM2, GERD, high cholesterol.   Underwent a colonoscopy in January of this year (personal hx of colon polyps) Normal exam. Internal hemorrhoids.   Review of Systems Past Medical History:  Diagnosis Date  . Arthritis    fingers  . Chronic back pain   . Diabetes mellitus Since 1995   Takes Glucovance and Onglyza daily  . Diabetes mellitus without complication ([place])   . GERD (gastroesophageal reflux disease)    takes Dexilant and Omeprazole daily  . Herpes   . History of colon polyps   . History of gastric ulcer 25+yrs ago  . Hyperlipidemia Since 1995   takes Simvastatin daily  . Hypertension Since 1995   takes Metoprolol and Ramipril daily  . Hypertension   . Pneumonia    hx of;as a child  . Sleep apnea    doesn't use CPAP  . Urinary frequency   . Urinary urgency    takes Flomax daily    Past Surgical History:  Procedure Laterality Date  . BACK SURGERY  1962   Lumbar  . BACK SURGERY    . bilateral cataract surgery    . CARDIAC CATHETERIZATION  02/16/10  . CERVICAL SPINE SURGERY    . COLONOSCOPY    . COLONOSCOPY N/A 04/23/2013   Procedure: COLONOSCOPY;  Surgeon: Rogene Houston, MD;  [facility];  Service: Endoscopy;  Laterality: N/A;  1030  . COLONOSCOPY N/A 07/24/2018   Procedure: COLONOSCOPY;  Surgeon: Rogene Houston, MD;  [facility];  Service: Endoscopy;  Laterality: N/A;  8:30  .  ESOPHAGOGASTRODUODENOSCOPY N/A 12/24/2014   Procedure: ESOPHAGOGASTRODUODENOSCOPY (EGD);  Surgeon: Rogene Houston, MD;  [facility];  Service: Endoscopy;  Laterality: N/A;  2:45  . ESOPHAGOGASTRODUODENOSCOPY (EGD) WITH ESOPHAGEAL DILATION    . HAND SURGERY     Right  . LUMBAR LAMINECTOMY/DECOMPRESSION MICRODISCECTOMY N/A 08/29/2012   Procedure: LUMBAR LAMINECTOMY/DECOMPRESSION MICRODISCECTOMY 1 LEVEL;  Surgeon: Floyce Stakes, MD;  Location: [facility];  Service: Neurosurgery;  Laterality: N/A;  Lumbar four-five laminectomies  . NECK SURGERY    . PENILE PROSTHESIS IMPLANT    . ROTATOR CUFF REPAIR     Right  . SHOULDER SURGERY    . WRIST SURGERY     Left  . WRIST SURGERY      Allergies  Allergen Reactions  . Aspirin Other (See Comments)    Higher dosage upsets stomach  . Bayer Aspirin [Aspirin] Other (See Comments)    Stomach "flares up"    Current Outpatient Medications on File Prior to Visit  Medication Sig Dispense Refill  . acyclovir (ZOVIRAX) 200 MG capsule Take 200 mg by mouth as needed.     Marland Kitchen aspirin 81 MG EC tablet Take 81 mg by mouth daily.      Marland Kitchen glyBURIDE-metformin (GLUCOVANCE) 5-500 MG per tablet Take 2 tablets  by mouth 2 (two) times daily with a meal.     . metoprolol (TOPROL-XL) 50 MG 24 hr tablet Take 50 mg by mouth daily.      Marland Kitchen omeprazole (PRILOSEC) 40 MG capsule Take 40 mg by mouth 2 (two) times daily.     Marland Kitchen oxyCODONE (OXY IR/ROXICODONE) 5 MG immediate release tablet Take 5 mg by mouth 3 (three) times daily. For pain    . ramipril (ALTACE) 10 MG capsule Take 10 mg by mouth daily.     . simvastatin (ZOCOR) 20 MG tablet Take 20 mg by mouth daily.    . tamsulosin (FLOMAX) 0.4 MG CAPS capsule Take 0.4 mg by mouth daily.    . TRESIBA FLEXTOUCH 200 UNIT/ML SOPN Inject 12 Units into the skin at bedtime.   4  . [DISCONTINUED] bismuth-metronidazole-tetracycline (PLYERA) 140-125-125 MG per capsule Take 3 capsules by mouth 4 (four) times daily -  before meals  and at bedtime.     No current facility-administered medications on file prior to visit.         Objective:   Physical Exam Blood pressure (!) 171/85, pulse 76, temperature 98.1 F (36.7 C), height 5\' 10"  (1.778 m), weight 162 lb (73.5 kg).  Alert and oriented. Skin warm and dry. Oral mucosa is moist.   . Sclera anicteric, conjunctivae is pink. Thyroid not enlarged. No cervical lymphadenopathy. Lungs clear. Heart regular rate and rhythm.  Abdomen is soft. Bowel sounds are positive. No hepatomegaly. No abdominal masses felt. No tenderness.  No edema to lower extremities. Patient is alert and oriented.        Assessment & Plan:  GERD. Am going to switch him to Protonix and see how he does.  PR in 2 weeks.

## 2018-09-29 ENCOUNTER — Other Ambulatory Visit: Payer: Self-pay

## 2018-09-29 ENCOUNTER — Encounter (INDEPENDENT_AMBULATORY_CARE_PROVIDER_SITE_OTHER): Payer: Self-pay | Admitting: Internal Medicine

## 2018-09-29 ENCOUNTER — Ambulatory Visit (INDEPENDENT_AMBULATORY_CARE_PROVIDER_SITE_OTHER): Payer: Medicare HMO | Admitting: Internal Medicine

## 2018-09-29 ENCOUNTER — Telehealth (INDEPENDENT_AMBULATORY_CARE_PROVIDER_SITE_OTHER): Payer: Self-pay | Admitting: Internal Medicine

## 2018-09-29 VITALS — BP 147/57 | HR 68 | Temp 97.6°F | Ht 70.0 in | Wt 161.4 lb

## 2018-09-29 DIAGNOSIS — R112 Nausea with vomiting, unspecified: Secondary | ICD-10-CM

## 2018-09-29 LAB — CBC WITH DIFFERENTIAL/PLATELET
Absolute Monocytes: 383 cells/uL (ref 200–950)
Basophils Absolute: 9 cells/uL (ref 0–200)
Basophils Relative: 0.2 %
Eosinophils Absolute: 112 cells/uL (ref 15–500)
Eosinophils Relative: 2.6 %
HCT: 34.2 % — ABNORMAL LOW (ref 38.5–50.0)
Hemoglobin: 11.8 g/dL — ABNORMAL LOW (ref 13.2–17.1)
Lymphs Abs: 1436 cells/uL (ref 850–3900)
MCH: 31.9 pg (ref 27.0–33.0)
MCHC: 34.5 g/dL (ref 32.0–36.0)
MCV: 92.4 fL (ref 80.0–100.0)
MPV: 10.2 fL (ref 7.5–12.5)
Monocytes Relative: 8.9 %
Neutro Abs: 2361 cells/uL (ref 1500–7800)
Neutrophils Relative %: 54.9 %
Platelets: 187 10*3/uL (ref 140–400)
RBC: 3.7 10*6/uL — AB (ref 4.20–5.80)
RDW: 12.8 % (ref 11.0–15.0)
Total Lymphocyte: 33.4 %
WBC: 4.3 10*3/uL (ref 3.8–10.8)

## 2018-09-29 LAB — HEPATIC FUNCTION PANEL
AG Ratio: 1.6 (calc) (ref 1.0–2.5)
ALBUMIN MSPROF: 4.2 g/dL (ref 3.6–5.1)
ALT: 15 U/L (ref 9–46)
AST: 16 U/L (ref 10–35)
Alkaline phosphatase (APISO): 39 U/L (ref 35–144)
BILIRUBIN DIRECT: 0.1 mg/dL (ref 0.0–0.2)
Globulin: 2.6 g/dL (calc) (ref 1.9–3.7)
Indirect Bilirubin: 0.4 mg/dL (calc) (ref 0.2–1.2)
Total Bilirubin: 0.5 mg/dL (ref 0.2–1.2)
Total Protein: 6.8 g/dL (ref 6.1–8.1)

## 2018-09-29 NOTE — Telephone Encounter (Signed)
err

## 2018-09-29 NOTE — Patient Instructions (Signed)
CBC and Hepatic.

## 2018-09-29 NOTE — Progress Notes (Signed)
Subjective:    Patient ID: [name], male    DOB: 1940-12-08, 78 y.o.   MRN: [id number]  HPI Presents today with c/o nausea and vomiting x 1 week. Vomited x 1 last Monday and Thursday. Has been taking Nausea Relief daily for the nausea. His appetite is okay. No weight loss. Last Thursday he ate a heavy breakfast and vomited.  Has been eating okay now. States he gets "full quick". His BMs are moving okay.  States stools are large and he has to strain.  He denies any fever.     Review of Systems Past Medical History:  Diagnosis Date  . Arthritis    fingers  . Chronic back pain   . Diabetes mellitus Since 1995   Takes Glucovance and Onglyza daily  . Diabetes mellitus without complication ([place])   . GERD (gastroesophageal reflux disease)    takes Dexilant and Omeprazole daily  . Herpes   . History of colon polyps   . History of gastric ulcer 25+yrs ago  . Hyperlipidemia Since 1995   takes Simvastatin daily  . Hypertension Since 1995   takes Metoprolol and Ramipril daily  . Hypertension   . Pneumonia    hx of;as a child  . Sleep apnea    doesn't use CPAP  . Urinary frequency   . Urinary urgency    takes Flomax daily    Past Surgical History:  Procedure Laterality Date  . BACK SURGERY  1962   Lumbar  . BACK SURGERY    . bilateral cataract surgery    . CARDIAC CATHETERIZATION  02/16/10  . CERVICAL SPINE SURGERY    . COLONOSCOPY    . COLONOSCOPY N/A 04/23/2013   Procedure: COLONOSCOPY;  Surgeon: Rogene Houston, MD;  [facility];  Service: Endoscopy;  Laterality: N/A;  1030  . COLONOSCOPY N/A 07/24/2018   Procedure: COLONOSCOPY;  Surgeon: Rogene Houston, MD;  [facility];  Service: Endoscopy;  Laterality: N/A;  8:30  . ESOPHAGOGASTRODUODENOSCOPY N/A 12/24/2014   Procedure: ESOPHAGOGASTRODUODENOSCOPY (EGD);  Surgeon: Rogene Houston, MD;  [facility];  Service: Endoscopy;  Laterality: N/A;  2:45  . ESOPHAGOGASTRODUODENOSCOPY  (EGD) WITH ESOPHAGEAL DILATION    . HAND SURGERY     Right  . LUMBAR LAMINECTOMY/DECOMPRESSION MICRODISCECTOMY N/A 08/29/2012   Procedure: LUMBAR LAMINECTOMY/DECOMPRESSION MICRODISCECTOMY 1 LEVEL;  Surgeon: Floyce Stakes, MD;  Location: [facility];  Service: Neurosurgery;  Laterality: N/A;  Lumbar four-five laminectomies  . NECK SURGERY    . PENILE PROSTHESIS IMPLANT    . ROTATOR CUFF REPAIR     Right  . SHOULDER SURGERY    . WRIST SURGERY     Left  . WRIST SURGERY      Allergies  Allergen Reactions  . Aspirin Other (See Comments)    Higher dosage upsets stomach  . Bayer Aspirin [Aspirin] Other (See Comments)    Stomach "flares up"    Current Outpatient Medications on File Prior to Visit  Medication Sig Dispense Refill  . acyclovir (ZOVIRAX) 200 MG capsule Take 200 mg by mouth as needed.     Marland Kitchen aspirin 81 MG EC tablet Take 81 mg by mouth daily.      . Fructose-Dextrose-Phosphor Acd (NAUSEA RELIEF PO) Take by mouth.    . glyBURIDE-metformin (GLUCOVANCE) 5-500 MG per tablet Take 2 tablets by mouth 2 (two) times daily with a meal.     . metoprolol (TOPROL-XL) 50 MG 24 hr tablet  Take 50 mg by mouth daily.      Marland Kitchen omeprazole (PRILOSEC) 40 MG capsule Take 40 mg by mouth 2 (two) times daily.     Marland Kitchen oxyCODONE (OXY IR/ROXICODONE) 5 MG immediate release tablet Take 5 mg by mouth 3 (three) times daily. For pain    . pantoprazole (PROTONIX) 40 MG tablet Take 1 tablet (40 mg total) by mouth daily. 30 tablet 11  . ramipril (ALTACE) 10 MG capsule Take 10 mg by mouth daily.     . simvastatin (ZOCOR) 20 MG tablet Take 20 mg by mouth daily.    . tamsulosin (FLOMAX) 0.4 MG CAPS capsule Take 0.4 mg by mouth daily.    . TRESIBA FLEXTOUCH 200 UNIT/ML SOPN Inject 12 Units into the skin at bedtime.   4  . [DISCONTINUED] bismuth-metronidazole-tetracycline (PLYERA) 140-125-125 MG per capsule Take 3 capsules by mouth 4 (four) times daily -  before meals and at bedtime.     No current  facility-administered medications on file prior to visit.         Objective:   Physical Exam Blood pressure (!) 147/57, pulse 68, temperature 97.6 F (36.4 C), height 5\' 10"  (1.778 m), weight 161 lb 6.4 oz (73.2 kg). Alert and oriented. Skin warm and dry. Oral mucosa is moist.   . Sclera anicteric, conjunctivae is pink. Thyroid not enlarged. No cervical lymphadenopathy. Lungs clear. Heart regular irregular.  Abdomen is soft. Bowel sounds are positive. No hepatomegaly. No abdominal masses felt. No tenderness.  No edema to lower extremities.           Assessment & Plan:  Nausea and vomiting. Has not vomited in 4 days. Will get a CBC and Hepatic.  He says the nausea meds is working so I will leave him on this.  Follow up with Dr. Cindie Laroche concerning your irregular heart.

## 2018-10-07 ENCOUNTER — Other Ambulatory Visit: Payer: Self-pay

## 2018-10-07 ENCOUNTER — Ambulatory Visit (INDEPENDENT_AMBULATORY_CARE_PROVIDER_SITE_OTHER): Payer: Medicare HMO | Admitting: Internal Medicine

## 2018-10-07 ENCOUNTER — Telehealth (INDEPENDENT_AMBULATORY_CARE_PROVIDER_SITE_OTHER): Payer: Self-pay | Admitting: Internal Medicine

## 2018-10-07 ENCOUNTER — Encounter (INDEPENDENT_AMBULATORY_CARE_PROVIDER_SITE_OTHER): Payer: Self-pay | Admitting: Internal Medicine

## 2018-10-07 VITALS — BP 158/95 | HR 73 | Temp 97.6°F | Ht 70.0 in | Wt 160.3 lb

## 2018-10-07 DIAGNOSIS — R112 Nausea with vomiting, unspecified: Secondary | ICD-10-CM | POA: Diagnosis not present

## 2018-10-07 DIAGNOSIS — R1013 Epigastric pain: Secondary | ICD-10-CM | POA: Diagnosis not present

## 2018-10-07 DIAGNOSIS — K920 Hematemesis: Secondary | ICD-10-CM | POA: Diagnosis not present

## 2018-10-07 MED ORDER — SUCRALFATE 1 GM/10ML PO SUSP: 1.0000 g | Freq: Four times a day (QID) | ORAL | 1 refills | Status: DC

## 2018-10-07 NOTE — Progress Notes (Signed)
Subjective:    Patient ID: [name], male    DOB: 11-20-1940, 78 y.o.   MRN: [id number] PCP Dr Cindie Laroche. HPI Presents today with c/o nausea and vomiting.  Seen approximately one week ago for same. Hepatic and CBC ordered which were both normal. There was no fever.  He tells me his stools have been green in color x 12 Saturday morning. He says he has epigastric pain. He coughed and he saw blood x 1 this past Saturday (3 days ago).  Has not seen anymore blood since then . Denies any fever. Stools are formed. No melena or BRRB. He c/o nausea which is not daily.  His appetite is good. He get "fuller" now than he use to.  Denies urinary symptoms.  CBC    Component Value Date/Time   WBC 4.3 09/29/2018 0856   RBC 3.70 (L) 09/29/2018 0856   HGB 11.8 (L) 09/29/2018 0856   HCT 34.2 (L) 09/29/2018 0856   PLT 187 09/29/2018 0856   MCV 92.4 09/29/2018 0856   MCH 31.9 09/29/2018 0856   MCHC 34.5 09/29/2018 0856   RDW 12.8 09/29/2018 0856   LYMPHSABS 1,436 09/29/2018 0856   MONOABS 0.2 11/16/2017 1616   EOSABS 112 09/29/2018 0856   BASOSABS 9 09/29/2018 0856   Hepatic Function Panel     Component Value Date/Time   PROT 6.8 09/29/2018 0856   ALBUMIN 3.8 11/16/2017 1616   AST 16 09/29/2018 0856   ALT 15 09/29/2018 0856   ALKPHOS 33 (L) 11/16/2017 1616   BILITOT 0.5 09/29/2018 0856   BILIDIR 0.1 09/29/2018 0856   IBILI 0.4 09/29/2018 0856     12/24/2014 EGD: hematemesis.  Impression: Small sliding hiatal hernia without evidence of erosive esophagitis ring or stricture formation. Antral gastritis with small whitish plaque. Biopsy taken. Biopsy: Negative for H. Pylori. 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     Review of Systems Past Medical History:  Diagnosis Date  . Arthritis    fingers  . Chronic back pain   . Diabetes mellitus Since 1995   Takes Glucovance and Onglyza daily  . Diabetes mellitus without complication ([place])   . GERD  (gastroesophageal reflux disease)    takes Dexilant and Omeprazole daily  . Herpes   . History of colon polyps   . History of gastric ulcer 25+yrs ago  . Hyperlipidemia Since 1995   takes Simvastatin daily  . Hypertension Since 1995   takes Metoprolol and Ramipril daily  . Hypertension   . Pneumonia    hx of;as a child  . Sleep apnea    doesn't use CPAP  . Urinary frequency   . Urinary urgency    takes Flomax daily    Past Surgical History:  Procedure Laterality Date  . BACK SURGERY  1962   Lumbar  . BACK SURGERY    . bilateral cataract surgery    . CARDIAC CATHETERIZATION  02/16/10  . CERVICAL SPINE SURGERY    . COLONOSCOPY    . COLONOSCOPY N/A 04/23/2013   Procedure: COLONOSCOPY;  Surgeon: Rogene Houston, MD;  [facility];  Service: Endoscopy;  Laterality: N/A;  1030  . COLONOSCOPY N/A 07/24/2018   Procedure: COLONOSCOPY;  Surgeon: Rogene Houston, MD;  [facility];  Service: Endoscopy;  Laterality: N/A;  8:30  . ESOPHAGOGASTRODUODENOSCOPY N/A 12/24/2014   Procedure: ESOPHAGOGASTRODUODENOSCOPY (EGD);  Surgeon: Rogene Houston, MD;  [facility];  Service: Endoscopy;  Laterality: N/A;  2:45  .  ESOPHAGOGASTRODUODENOSCOPY (EGD) WITH ESOPHAGEAL DILATION    . HAND SURGERY     Right  . LUMBAR LAMINECTOMY/DECOMPRESSION MICRODISCECTOMY N/A 08/29/2012   Procedure: LUMBAR LAMINECTOMY/DECOMPRESSION MICRODISCECTOMY 1 LEVEL;  Surgeon: Floyce Stakes, MD;  Location: [place] [facility];  Service: Neurosurgery;  Laterality: N/A;  Lumbar four-five laminectomies  . NECK SURGERY    . PENILE PROSTHESIS IMPLANT    . ROTATOR CUFF REPAIR     Right  . SHOULDER SURGERY    . WRIST SURGERY     Left  . WRIST SURGERY      Allergies  Allergen Reactions  . Aspirin Other (See Comments)    Higher dosage upsets stomach  . Bayer Aspirin [Aspirin] Other (See Comments)    Stomach "flares up"    Current Outpatient Medications on File Prior to Visit  Medication Sig  Dispense Refill  . acyclovir (ZOVIRAX) 200 MG capsule Take 200 mg by mouth as needed.     Marland Kitchen aspirin 81 MG EC tablet Take 81 mg by mouth daily.      . Fructose-Dextrose-Phosphor Acd (NAUSEA RELIEF PO) Take by mouth.    . glyBURIDE-metformin (GLUCOVANCE) 5-500 MG per tablet Take 2 tablets by mouth 2 (two) times daily with a meal.     . metoprolol (TOPROL-XL) 50 MG 24 hr tablet Take 50 mg by mouth daily.      Marland Kitchen oxyCODONE (OXY IR/ROXICODONE) 5 MG immediate release tablet Take 5 mg by mouth 3 (three) times daily. For pain    . pantoprazole (PROTONIX) 40 MG tablet Take 1 tablet (40 mg total) by mouth daily. 30 tablet 11  . ramipril (ALTACE) 10 MG capsule Take 10 mg by mouth daily.     . simvastatin (ZOCOR) 20 MG tablet Take 20 mg by mouth daily.    . tamsulosin (FLOMAX) 0.4 MG CAPS capsule Take 0.4 mg by mouth daily.    . TRESIBA FLEXTOUCH 200 UNIT/ML SOPN Inject 12 Units into the skin at bedtime.   4  . [DISCONTINUED] bismuth-metronidazole-tetracycline (PLYERA) 140-125-125 MG per capsule Take 3 capsules by mouth 4 (four) times daily -  before meals and at bedtime.     No current facility-administered medications on file prior to visit.         Objective:   Physical Exam Blood pressure (!) 158/95, pulse 73, temperature 97.6 F (36.4 C), height 5\' 10"  (1.778 m), weight 160 lb 4.8 oz (72.7 kg). Alert and oriented. Skin warm and dry. Oral mucosa is moist.   . Sclera anicteric, conjunctivae is pink. Thyroid not enlarged. No cervical lymphadenopathy. Lungs clear. Heart regular rate and rhythm.  Abdomen is soft. No rebound tenderness. Does have slight epigastric tenderness. Bowel sounds are positive. No hepatomegaly. No abdominal masses felt. No edema to lower extremities. Ambulates without difficulty. Moves all extr dddemities.  No         Assessment & Plan:  Epigastric pain. Nausea and vomiting. Hematemesis. Am going to get an US abdomen. Rx for Carafate sent to his pharmacy.

## 2018-10-07 NOTE — Patient Instructions (Signed)
US abdomen. Rx for Carafate sent to his pharmacy.

## 2018-10-07 NOTE — Telephone Encounter (Signed)
Patient is aware that he cannot have his [place] till after 10/27/2018. I advised if pain worsened to go to the ED

## 2018-10-27 ENCOUNTER — Ambulatory Visit (HOSPITAL_COMMUNITY)
Admission: RE | Admit: 2018-10-27 | Discharge: 2018-10-27 | Disposition: A | Discharge status: Home / Self Care | Payer: Medicare HMO | Source: Ambulatory Visit | Attending: Internal Medicine | Admitting: Internal Medicine

## 2018-10-27 ENCOUNTER — Other Ambulatory Visit: Payer: Self-pay

## 2018-10-27 DIAGNOSIS — R1013 Epigastric pain: Secondary | ICD-10-CM

## 2018-10-27 DIAGNOSIS — R112 Nausea with vomiting, unspecified: Secondary | ICD-10-CM | POA: Diagnosis not present

## 2018-11-01 ENCOUNTER — Emergency Department (HOSPITAL_COMMUNITY): Payer: Medicare HMO

## 2018-11-01 ENCOUNTER — Encounter (HOSPITAL_COMMUNITY): Payer: Self-pay | Admitting: Emergency Medicine

## 2018-11-01 ENCOUNTER — Emergency Department (HOSPITAL_COMMUNITY)
Admission: EM | Admit: 2018-11-01 | Discharge: 2018-11-01 | Disposition: A | Discharge status: Home / Self Care | Payer: Medicare HMO | Attending: Emergency Medicine | Admitting: Emergency Medicine

## 2018-11-01 ENCOUNTER — Other Ambulatory Visit: Payer: Self-pay

## 2018-11-01 DIAGNOSIS — Z7982 Long term (current) use of aspirin: Secondary | ICD-10-CM | POA: Diagnosis not present

## 2018-11-01 DIAGNOSIS — I10 Essential (primary) hypertension: Secondary | ICD-10-CM | POA: Insufficient documentation

## 2018-11-01 DIAGNOSIS — Z79899 Other long term (current) drug therapy: Secondary | ICD-10-CM | POA: Insufficient documentation

## 2018-11-01 DIAGNOSIS — Z7984 Long term (current) use of oral hypoglycemic drugs: Secondary | ICD-10-CM | POA: Insufficient documentation

## 2018-11-01 DIAGNOSIS — E119 Type 2 diabetes mellitus without complications: Secondary | ICD-10-CM | POA: Insufficient documentation

## 2018-11-01 DIAGNOSIS — R0989 Other specified symptoms and signs involving the circulatory and respiratory systems: Secondary | ICD-10-CM | POA: Insufficient documentation

## 2018-11-01 MED ORDER — BENZOCAINE 20 % MT AERO
INHALATION_SPRAY | Freq: Once | OROMUCOSAL | Status: AC
  Administered 2018-11-01: 1 via OROMUCOSAL
  Filled 2018-11-01: qty 57

## 2018-11-01 NOTE — ED Provider Notes (Signed)
[facility] EMERGENCY DEPARTMENT Provider Note   CSN: [id number] Arrival date & time: 11/01/18  1346    History   Chief Complaint Chief Complaint  Patient presents with  . Swallowed Foreign Body    HPI [name] is a 78 y.o. male.     HPI  The patient is a very pleasant 78 year old male with a known history of acid reflux, history of diabetes and a history of hyperlipidemia and hypertension.  He presents to the hospital today after complaining of a swallowed foreign body that occurred yesterday approximately 24 hours ago.  He reports that he was eating a fruit cup, he had too much in his mouth, he swallowed it all at the same time when he tried to jump up to go get the front door when the doorbell rang.  He felt like he choked on it and since that time has had an abnormal sensation in the right lateral neck.  He has been able to tolerate his secretions and is even had a bacon lettuce and tomato sandwich today with and was able to swallow it without any difficulty.  He has no nausea or vomiting, he is not coughing or feeling short of breath, he has had to have an esophageal dilation in the past by Dr. Laural Golden per the patient's report.  He was seen recently by this gastroenterologist after having some difficulty swallowing his evening pills but at that time he states that his symptoms were not severe enough to need a repeat dilation and he states that he usually tolerates most of his food without difficulty  Past Medical History:  Diagnosis Date  . Arthritis    fingers  . Chronic back pain   . Diabetes mellitus Since 1995   Takes Glucovance and Onglyza daily  . Diabetes mellitus without complication ([place])   . GERD (gastroesophageal reflux disease)    takes Dexilant and Omeprazole daily  . Herpes   . History of colon polyps   . History of gastric ulcer 25+yrs ago  . Hyperlipidemia Since 1995   takes Simvastatin daily  . Hypertension Since 1995   takes Metoprolol and  Ramipril daily  . Hypertension   . Pneumonia    hx of;as a child  . Sleep apnea    doesn't use CPAP  . Urinary frequency   . Urinary urgency    takes Flomax daily    Patient Active Problem List   Diagnosis Date Noted  . History of colonic polyps 04/24/2018  . 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 GERD (gastroesophageal reflux disease) 09/11/2011  . High cholesterol 09/11/2011  . Bronchitis 09/11/2011  . DM 02/13/2010  . ESSENTIAL HYPERTENSION, BENIGN 02/13/2010  . PRECORDIAL PAIN 02/13/2010    Past Surgical History:  Procedure Laterality Date  . BACK SURGERY  1962   Lumbar  . BACK SURGERY    . bilateral cataract surgery    . CARDIAC CATHETERIZATION  02/16/10  . CERVICAL SPINE SURGERY    . COLONOSCOPY    . COLONOSCOPY N/A 04/23/2013   Procedure: COLONOSCOPY;  Surgeon: Rogene Houston, MD;  [facility];  Service: Endoscopy;  Laterality: N/A;  1030  . COLONOSCOPY N/A 07/24/2018   Procedure: COLONOSCOPY;  Surgeon: Rogene Houston, MD;  Location: [facility];  Service: Endoscopy;  Laterality: N/A;  8:30  . ESOPHAGOGASTRODUODENOSCOPY N/A 12/24/2014   Procedure: ESOPHAGOGASTRODUODENOSCOPY (EGD);  Surgeon: Rogene Houston, MD;  Location: [facility];  Service: Endoscopy;  Laterality: N/A;  2:45  . ESOPHAGOGASTRODUODENOSCOPY (EGD) WITH ESOPHAGEAL DILATION    . HAND SURGERY     Right  . LUMBAR LAMINECTOMY/DECOMPRESSION MICRODISCECTOMY N/A 08/29/2012   Procedure: LUMBAR LAMINECTOMY/DECOMPRESSION MICRODISCECTOMY 1 LEVEL;  Surgeon: Floyce Stakes, MD;  Location: [place] [facility];  Service: Neurosurgery;  Laterality: N/A;  Lumbar four-five laminectomies  . NECK SURGERY    . PENILE PROSTHESIS IMPLANT    . ROTATOR CUFF REPAIR     Right  . SHOULDER SURGERY    . WRIST SURGERY     Left  . WRIST SURGERY          Home Medications    Prior to Admission medications   Medication Sig Start Date End Date Taking? Authorizing Provider  acyclovir (ZOVIRAX) 200 MG capsule Take  200 mg by mouth as needed.  01/07/14   [provider]  aspirin 81 MG EC tablet Take 81 mg by mouth daily.      [provider]  Fructose-Dextrose-Phosphor Acd (NAUSEA RELIEF PO) Take by mouth.    [provider]  glyBURIDE-metformin (GLUCOVANCE) 5-500 MG per tablet Take 2 tablets by mouth 2 (two) times daily with a meal.     [provider]  metoprolol (TOPROL-XL) 50 MG 24 hr tablet Take 50 mg by mouth daily.      [provider]  oxyCODONE (OXY IR/ROXICODONE) 5 MG immediate release tablet Take 5 mg by mouth 3 (three) times daily. For pain 06/16/13   [provider]  pantoprazole (PROTONIX) 40 MG tablet Take 1 tablet (40 mg total) by mouth daily. 09/17/18 09/17/19  Setzer, Rona Ravens, NP  ramipril (ALTACE) 10 MG capsule Take 10 mg by mouth daily.  02/05/14   [provider]  simvastatin (ZOCOR) 20 MG tablet Take 20 mg by mouth daily. 04/15/13   [provider]  sucralfate (CARAFATE) 1 GM/10ML suspension Take 10 mLs (1 g total) by mouth 4 (four) times daily. 15 minutes before each meal and at bedtime. 10/07/18   Setzer, Rona Ravens, NP  tamsulosin (FLOMAX) 0.4 MG CAPS capsule Take 0.4 mg by mouth daily.    [provider]  TRESIBA FLEXTOUCH 200 UNIT/ML SOPN Inject 12 Units into the skin at bedtime.  05/07/18   [provider]  bismuth-metronidazole-tetracycline (PLYERA) 140-125-125 MG per capsule Take 3 capsules by mouth 4 (four) times daily -  before meals and at bedtime.  09/26/11  [provider]    Family History Family History  Problem Relation Age of Onset  . Heart attack Father 66       MI  . Hypertension Father   . Heart attack Brother 41       MI  . Diabetes Mother   . Heart attack Paternal Uncle     Social History Social History   Tobacco Use  . Smoking status: Never Smoker  . Smokeless tobacco: Never Used  Substance Use Topics  . Alcohol use: Yes    Comment: occasionally beer  . Drug use:  Yes    Frequency: 3.0 times per week    Types: Marijuana    Comment: Sunday     Allergies   Aspirin and Bayer aspirin [aspirin]   Review of Systems Review of Systems  All other systems reviewed and are negative.    Physical Exam Updated Vital Signs There were no vitals taken for this visit.  Physical Exam Vitals signs and nursing note reviewed.  Constitutional:      General: He is not in  acute distress.    Appearance: He is well-developed.  HENT:     Head: Normocephalic and atraumatic.     Mouth/Throat:     Pharynx: No oropharyngeal exudate.     Comments: The mouth is clear, the oropharynx is clear and moist, phonation is normal, no foreign body is visualized.  No bleeding, no distress, tolerating secretions, able to do without difficulty. Eyes:     General: No scleral icterus.       Right eye: No discharge.        Left eye: No discharge.     Conjunctiva/sclera: Conjunctivae normal.     Pupils: Pupils are equal, round, and reactive to light.  Neck:     Musculoskeletal: Normal range of motion and neck supple.     Thyroid: No thyromegaly.     Vascular: No JVD.  Cardiovascular:     Rate and Rhythm: Normal rate and regular rhythm.     Heart sounds: Normal heart sounds. No murmur. No friction rub. No gallop.   Pulmonary:     Effort: Pulmonary effort is normal. No respiratory distress.     Breath sounds: Normal breath sounds. No wheezing or rales.  Abdominal:     General: Bowel sounds are normal. There is no distension.     Palpations: Abdomen is soft. There is no mass.     Tenderness: There is no abdominal tenderness.  Musculoskeletal: Normal range of motion.        General: No tenderness.  Lymphadenopathy:     Cervical: No cervical adenopathy.  Skin:    General: Skin is warm and dry.     Findings: No erythema or rash.  Neurological:     Mental Status: He is alert.     Coordination: Coordination normal.  Psychiatric:        Behavior: Behavior normal.       ED Treatments / Results  Labs (all labs ordered are listed, but only abnormal results are displayed) Labs Reviewed - No data to display  EKG None  Radiology Dg Neck Soft Tissue  Result Date: 11/01/2018 CLINICAL DATA:  Swallowed piece of fruit yesterday, foreign body sensation. EXAM: NECK SOFT TISSUES - 1+ VIEW COMPARISON:  None. FINDINGS: There is no evidence of retropharyngeal soft tissue swelling or epiglottic enlargement. The cervical airway is unremarkable and no radio-opaque foreign body identified. IMPRESSION: Negative. Electronically Signed   By: Staci Righter [name]   On: 11/01/2018 15:02    Procedures Fiberoptic laryngoscopy Date/Time: 11/01/2018 3:02 PM Performed by: Noemi Chapel, MD Authorized by: Noemi Chapel, MD  Consent: Verbal consent obtained. Risks and benefits: risks, benefits and alternatives were discussed Consent given by: patient Patient understanding: patient states understanding of the procedure being performed Patient consent: the patient's understanding of the procedure matches consent given Procedure consent: procedure consent matches procedure scheduled Relevant documents: relevant documents present and verified Test results: test results available and properly labeled Site marked: the operative site was marked Imaging studies: imaging studies available Required items: required blood products, implants, devices, and special equipment available Patient identity confirmed: verbally with patient Time out: Immediately prior to procedure a "time out" was called to verify the correct patient, procedure, equipment, support staff and site/side marked as required. Preparation: Patient was prepped and draped in the usual sterile fashion. Local anesthesia used: yes Anesthesia method: cetacaine.  Anesthesia: Local anesthesia used: yes  Sedation: Patient sedated: no  Patient tolerance: Patient tolerated the procedure well with no immediate complications  Comments: No masses  foreign objects or bleeding or abnormal anatomy was seen on this fiberoptic laryngoscopy.  The patient tolerated this under Cetacaine, it was then passed through the nasopharynx which was tolerated better.  Again no foreign body seen.  Patient tolerating secretions well     (including critical care time)  Medications Ordered in ED Medications - No data to display   Initial Impression / Assessment and Plan / ED Course  I have reviewed the triage vital signs and the nursing notes.  Pertinent labs & imaging results that were available during my care of the patient were reviewed by me and considered in my medical decision making (see chart for details).       The patient has a globus sensation, will obtain an x-ray of the neck, he is able to swallow even a normal sandwich, suspect this is not a lodged foreign body but will need to evaluate with imaging.  Patient agreeable.  He is tolerating secretions and appears otherwise well  Will refer back to gastroenterology on outpatient basis.  Patient aware of the indications of return  Final Clinical Impressions(s) / ED Diagnoses   Final diagnoses:  None    ED Discharge Orders    None       Noemi Chapel, MD 11/01/18 1506

## 2018-11-01 NOTE — ED Triage Notes (Signed)
Swallowed piece of fruit last night.  Denies any pain just discomfort when swallowing.

## 2018-11-01 NOTE — Discharge Instructions (Signed)
Come back to the emergency department if your symptoms worsen.  Otherwise you can follow-up with your gastroenterologist in the clinic within the next 1 to 2 weeks.  Call on Monday morning for next available appointment.  Please try to eat only soft foods and drink lots of clear liquids over the next couple of days.

## 2018-11-24 ENCOUNTER — Ambulatory Visit (INDEPENDENT_AMBULATORY_CARE_PROVIDER_SITE_OTHER): Payer: Medicare HMO | Admitting: Internal Medicine

## 2018-11-24 ENCOUNTER — Encounter (INDEPENDENT_AMBULATORY_CARE_PROVIDER_SITE_OTHER): Payer: Self-pay | Admitting: Internal Medicine

## 2018-11-24 ENCOUNTER — Other Ambulatory Visit: Payer: Self-pay

## 2018-11-24 ENCOUNTER — Encounter (INDEPENDENT_AMBULATORY_CARE_PROVIDER_SITE_OTHER): Payer: Self-pay | Admitting: *Deleted

## 2018-11-24 VITALS — BP 192/89 | HR 53 | Temp 98.2°F | Ht 70.0 in | Wt 162.3 lb

## 2018-11-24 DIAGNOSIS — R1310 Dysphagia, unspecified: Secondary | ICD-10-CM | POA: Diagnosis not present

## 2018-11-24 DIAGNOSIS — K219 Gastro-esophageal reflux disease without esophagitis: Secondary | ICD-10-CM

## 2018-11-24 DIAGNOSIS — R1319 Other dysphagia: Secondary | ICD-10-CM

## 2018-11-24 MED ORDER — PANTOPRAZOLE SODIUM 40 MG PO TBEC: 40.0000 mg | DELAYED_RELEASE_TABLET | Freq: Two times a day (BID) | ORAL | 3 refills | Status: DC

## 2018-11-24 NOTE — Patient Instructions (Signed)
DG esophagram. Rx for Protonix 40mg  BID.

## 2018-11-24 NOTE — Progress Notes (Signed)
Subjective:    Patient ID: [name], male    DOB: [birth year], 78 y.o.   MRN: [id number]  HPI Present today with c/o abdominal pain. Last seen 10/07/2018 with  N and V. States the vomiting occurred after eating spicy foods at [employer]. He had epigastric pain. Today, he states he continues to have epigastric pain.  He say foods feel like are lodging in his esophagus. Dysphagia is a new symptoms. Tried to swallow a mucinex pill and it lodge.  He says he has early satiety. Weight in March was 160. Today it is 162.3.  He sometimes has dizziness. He continues to have some nausea,but no vomiting.  He does have some constipation a times.  Sometimes he will have diarrhea after he has constipation.      12/24/2014 EGD: hematemesis.  Impression: Small sliding hiatal hernia without evidence of erosive esophagitis ring or stricture formation. Antral gastritis with small whitish plaque. Biopsy taken. Biopsy: Negative for H. Pylori. 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   Underwent a colonoscopy in January of this year (personal hx of colon polyps) Normal exam. Internal hemorrhoids.   Review of Systems Past Medical History:  Diagnosis Date  . Arthritis    fingers  . Chronic back pain   . Diabetes mellitus Since 1995   Takes Glucovance and Onglyza daily  . Diabetes mellitus without complication ([place])   . GERD (gastroesophageal reflux disease)    takes Dexilant and Omeprazole daily  . Herpes   . History of colon polyps   . History of gastric ulcer 25+yrs ago  . Hyperlipidemia Since 1995   takes Simvastatin daily  . Hypertension Since 1995   takes Metoprolol and Ramipril daily  . Hypertension   . Pneumonia    hx of;as a child  . Sleep apnea    doesn't use CPAP  . Urinary frequency   . Urinary urgency    takes Flomax daily    Past Surgical History:  Procedure Laterality Date  . BACK SURGERY  1962   Lumbar  . BACK SURGERY    . bilateral  cataract surgery    . CARDIAC CATHETERIZATION  02/16/10  . CERVICAL SPINE SURGERY    . COLONOSCOPY    . COLONOSCOPY N/A 04/23/2013   Procedure: COLONOSCOPY;  Surgeon: Rogene Houston, MD;  [facility];  Service: Endoscopy;  Laterality: N/A;  1030  . COLONOSCOPY N/A 07/24/2018   Procedure: COLONOSCOPY;  Surgeon: Rogene Houston, MD;  [facility];  Service: Endoscopy;  Laterality: N/A;  8:30  . ESOPHAGOGASTRODUODENOSCOPY N/A 12/24/2014   Procedure: ESOPHAGOGASTRODUODENOSCOPY (EGD);  Surgeon: Rogene Houston, MD;  [facility];  Service: Endoscopy;  Laterality: N/A;  2:45  . ESOPHAGOGASTRODUODENOSCOPY (EGD) WITH ESOPHAGEAL DILATION    . HAND SURGERY     Right  . LUMBAR LAMINECTOMY/DECOMPRESSION MICRODISCECTOMY N/A 08/29/2012   Procedure: LUMBAR LAMINECTOMY/DECOMPRESSION MICRODISCECTOMY 1 LEVEL;  Surgeon: Floyce Stakes, MD;  Location: [place] [facility] ORS;  Service: Neurosurgery;  Laterality: N/A;  Lumbar four-five laminectomies  . NECK SURGERY    . PENILE PROSTHESIS IMPLANT    . ROTATOR CUFF REPAIR     Right  . SHOULDER SURGERY    . WRIST SURGERY     Left  . WRIST SURGERY      Allergies  Allergen Reactions  . Aspirin Other (See Comments)    Higher dosage upsets stomach  . Bayer Aspirin [Aspirin] Other (See Comments)  Stomach "flares up"    Current Outpatient Medications on File Prior to Visit  Medication Sig Dispense Refill  . acyclovir (ZOVIRAX) 200 MG capsule Take 200 mg by mouth as needed.     Marland Kitchen aspirin 81 MG EC tablet Take 81 mg by mouth daily.      . Fructose-Dextrose-Phosphor Acd (NAUSEA RELIEF PO) Take by mouth.    . glyBURIDE-metformin (GLUCOVANCE) 5-500 MG per tablet Take 2 tablets by mouth 2 (two) times daily with a meal.     . metoprolol (TOPROL-XL) 50 MG 24 hr tablet Take 50 mg by mouth daily.      Marland Kitchen oxyCODONE (OXY IR/ROXICODONE) 5 MG immediate release tablet Take 5 mg by mouth 3 (three) times daily. For pain    . pantoprazole (PROTONIX)  40 MG tablet Take 1 tablet (40 mg total) by mouth daily. 30 tablet 11  . ramipril (ALTACE) 10 MG capsule Take 10 mg by mouth daily.     . simvastatin (ZOCOR) 20 MG tablet Take 20 mg by mouth daily.    . tamsulosin (FLOMAX) 0.4 MG CAPS capsule Take 0.4 mg by mouth daily.    . TRESIBA FLEXTOUCH 200 UNIT/ML SOPN Inject 12 Units into the skin at bedtime.   4  . [DISCONTINUED] bismuth-metronidazole-tetracycline (PLYERA) 140-125-125 MG per capsule Take 3 capsules by mouth 4 (four) times daily -  before meals and at bedtime.     No current facility-administered medications on file prior to visit.         Objective:   Physical Exam Blood pressure (!) 192/89, pulse (!) 53, temperature 98.2 F (36.8 C), height 5\' 10"  (1.778 m), weight 162 lb 4.8 oz (73.6 kg). Alert and oriented. Skin warm and dry. Oral mucosa is moist.   . Sclera anicteric, conjunctivae is pink. Thyroid not enlarged. No cervical lymphadenopathy. Lungs clear. Heart regular rate and rhythm.  Abdomen is soft. Bowel sounds are positive. No hepatomegaly. No abdominal masses felt. No tenderness.  No edema to lower extremities.         Assessment & Plan:  GERD: Am going to increase his Protonix to BID. Dysphagia: Am going to get an Esophagram. Further recommendations to follow.  May need an EGD/ED.

## 2018-11-26 ENCOUNTER — Ambulatory Visit (HOSPITAL_COMMUNITY)
Admission: RE | Admit: 2018-11-26 | Discharge: 2018-11-26 | Disposition: A | Discharge status: Home / Self Care | Payer: Medicare HMO | Source: Ambulatory Visit | Attending: Internal Medicine | Admitting: Internal Medicine

## 2018-11-26 ENCOUNTER — Other Ambulatory Visit: Payer: Self-pay

## 2018-11-26 ENCOUNTER — Other Ambulatory Visit (INDEPENDENT_AMBULATORY_CARE_PROVIDER_SITE_OTHER): Payer: Self-pay | Admitting: Internal Medicine

## 2018-11-26 DIAGNOSIS — R1319 Other dysphagia: Secondary | ICD-10-CM

## 2018-11-26 DIAGNOSIS — R1310 Dysphagia, unspecified: Secondary | ICD-10-CM | POA: Diagnosis not present

## 2019-01-05 ENCOUNTER — Other Ambulatory Visit (INDEPENDENT_AMBULATORY_CARE_PROVIDER_SITE_OTHER): Payer: Self-pay | Admitting: Internal Medicine

## 2019-01-05 DIAGNOSIS — R110 Nausea: Secondary | ICD-10-CM

## 2019-01-05 MED ORDER — ONDANSETRON HCL 4 MG PO TABS: 4.0000 mg | ORAL_TABLET | Freq: Every day | ORAL | 1 refills | Status: AC | PRN

## 2019-01-05 NOTE — Progress Notes (Signed)
zo

## 2019-02-10 ENCOUNTER — Ambulatory Visit (INDEPENDENT_AMBULATORY_CARE_PROVIDER_SITE_OTHER): Payer: Medicare HMO | Admitting: Urology

## 2019-02-10 ENCOUNTER — Other Ambulatory Visit (HOSPITAL_COMMUNITY)
Admission: RE | Admit: 2019-02-10 | Discharge: 2019-02-10 | Disposition: A | Discharge status: Home / Self Care | Payer: Medicare HMO | Source: Ambulatory Visit | Attending: Urology | Admitting: Urology

## 2019-02-10 DIAGNOSIS — N401 Enlarged prostate with lower urinary tract symptoms: Secondary | ICD-10-CM | POA: Insufficient documentation

## 2019-02-11 LAB — URINE CULTURE: Culture: NO GROWTH

## 2019-03-17 ENCOUNTER — Ambulatory Visit (INDEPENDENT_AMBULATORY_CARE_PROVIDER_SITE_OTHER): Payer: Medicare HMO | Admitting: Urology

## 2019-03-17 DIAGNOSIS — N401 Enlarged prostate with lower urinary tract symptoms: Secondary | ICD-10-CM

## 2019-04-08 ENCOUNTER — Ambulatory Visit: Payer: Medicare HMO | Admitting: Urology

## 2019-04-10 ENCOUNTER — Other Ambulatory Visit: Payer: Self-pay | Admitting: Urology

## 2019-04-10 ENCOUNTER — Other Ambulatory Visit (HOSPITAL_COMMUNITY): Payer: Self-pay | Admitting: Urology

## 2019-04-10 DIAGNOSIS — N401 Enlarged prostate with lower urinary tract symptoms: Secondary | ICD-10-CM

## 2019-04-22 ENCOUNTER — Ambulatory Visit (HOSPITAL_COMMUNITY)
Admission: RE | Admit: 2019-04-22 | Discharge: 2019-04-22 | Disposition: A | Discharge status: Home / Self Care | Payer: Medicare HMO | Source: Ambulatory Visit | Attending: Urology | Admitting: Urology

## 2019-04-22 ENCOUNTER — Other Ambulatory Visit: Payer: Self-pay

## 2019-04-22 DIAGNOSIS — N401 Enlarged prostate with lower urinary tract symptoms: Secondary | ICD-10-CM | POA: Insufficient documentation

## 2019-04-29 ENCOUNTER — Ambulatory Visit (INDEPENDENT_AMBULATORY_CARE_PROVIDER_SITE_OTHER): Payer: Medicare HMO | Admitting: Urology

## 2019-04-29 DIAGNOSIS — N401 Enlarged prostate with lower urinary tract symptoms: Secondary | ICD-10-CM

## 2019-04-29 DIAGNOSIS — R3912 Poor urinary stream: Secondary | ICD-10-CM

## 2019-05-04 ENCOUNTER — Other Ambulatory Visit: Payer: Self-pay | Admitting: Urology

## 2019-05-19 NOTE — Patient Instructions (Signed)
[name]  05/19/2019     @PREFPERIOPPHARMACY @   Your procedure is scheduled on  05/25/2019 .  Report to [facility] at  [phone]  A.M.  Call this number if you have problems the morning of surgery:  [phone]   Remember:  Do not eat or drink after midnight.                        Take these medicines the morning of surgery with A SIP OF WATER  Metoprolol, zofran(if needed), oxycodone(if needed), protonix. Take 1/2 of your usual night time insulin the night before your surgery. DO NOT take any medications for diabetes the morning of your surgery.    Do not wear jewelry, make-up or nail polish.  Do not wear lotions, powders, or perfumes. Please wear deodorant and brush your teeth.  Do not shave 48 hours prior to surgery.  Men may shave face and neck.  Do not bring valuables to the hospital.  [facility] is not responsible for any belongings or valuables.  Contacts, dentures or bridgework may not be worn into surgery.  Leave your suitcase in the car.  After surgery it may be brought to your room.  For patients admitted to the hospital, discharge time will be determined by your treatment team.  Patients discharged the day of surgery will not be allowed to drive home.   Name and phone number of your driver:   family Special instructions:  None  Please read over the following fact sheets that you were given. Anesthesia Post-op Instructions and Care and Recovery After Surgery       Transurethral Resection of the Prostate, Care After This sheet gives you information about how to care for yourself after your procedure. Your health care provider may also give you more specific instructions. If you have problems or questions, contact your health care provider. What can I expect after the procedure? After the procedure, it is common to have:  Mild pain in your lower abdomen.  Soreness or mild discomfort in your penis from having the catheter inserted during  the procedure.  A feeling of urgency when you need to urinate.  A small amount of blood in your urine. You may notice some small blood clots in your urine. These are normal. Follow these instructions at home: Medicines  Take over-the-counter and prescription medicines only as told by your health care provider.  If you were prescribed an antibiotic medicine, take it as told by your health care provider. Do not stop taking the antibiotic even if you start to feel better.  Ask your health care provider if the medicine prescribed to you: ? Requires you to avoid driving or using heavy machinery. ? Can cause constipation. You may need to take actions to prevent or treat constipation, such as:  Take over-the-counter or prescription medicines.  Eat foods that are high in fiber, such as fresh fruits and vegetables, whole grains, and beans.  Limit foods that are high in fat and processed sugars, such as fried or sweet foods.  Do not drive for 24 hours if you were given a sedative during your procedure. Activity   Return to your normal activities as told by your health care provider. Ask your health care provider what activities are safe for you.  Do not lift anything that is heavier than 10 lb (4.5 kg), or the limit that you are told, for 3  weeks after the procedure or until your health care provider says that it is safe.  Avoid intense physical activity for as long as told by your health care provider.  Avoid sitting for a long time without moving. Get up and move around one or more times every few hours. This helps to prevent blood clots. You may increase your physical activity gradually as you start to feel better. Lifestyle  Do not drink alcohol for as long as told by your health care provider. This is especially important if you are taking prescription pain medicines.  Do not engage in sexual activity until your health care provider says that you can do this. General  instructions   Do not take baths, swim, or use a hot tub until your health care provider approves.  Drink enough fluid to keep your urine pale yellow.  Urinate as soon as you feel the need to. Do not try to hold your urine for long periods of time.  If your health care provider approves, you may take a stool softener for 2-3 weeks to prevent you from straining to have a bowel movement.  Wear compression stockings as told by your health care provider. These stockings help to prevent blood clots and reduce swelling in your legs.  Keep all follow-up visits as told by your health care provider. This is important. Contact a health care provider if you have:  Difficulty urinating.  A fever.  Pain that gets worse or does not improve with medicine.  Blood in your urine that does not go away after 1 week of resting and drinking more fluids.  Swelling in your penis or testicles. Get help right away if:  You are unable to urinate.  You are having more blood clots in your urine instead of fewer.  You have: ? Large blood clots. ? A lot of blood in your urine. ? Pain in your back or lower abdomen. ? Pain or swelling in your legs. ? Chills and you are shaking. ? Difficulty breathing or shortness of breath. Summary  After the procedure, it is common to have a small amount of blood in your urine.  Avoid heavy lifting and intense physical activity for as long as told by your health care provider.  Urinate as soon as you feel the need to. Do not try to hold your urine for long periods of time.  Keep all follow-up visits as told by your health care provider. This is important. This information is not intended to replace advice given to you by your health care provider. Make sure you discuss any questions you have with your health care provider. Document Released: 06/25/2005 Document Revised: 10/15/2018 Document Reviewed: 03/26/2018 Elsevier Patient Education  2020 [employer] Anesthesia, Adult, Care After This sheet gives you information about how to care for yourself after your procedure. Your health care provider may also give you more specific instructions. If you have problems or questions, contact your health care provider. What can I expect after the procedure? After the procedure, the following side effects are common:  Pain or discomfort at the IV site.  Nausea.  Vomiting.  Sore throat.  Trouble concentrating.  Feeling cold or chills.  Weak or tired.  Sleepiness and fatigue.  Soreness and body aches. These side effects can affect parts of the body that were not involved in surgery. Follow these instructions at home:  For at least 24 hours after the procedure:  Have a responsible adult stay with  you. It is important to have someone help care for you until you are awake and alert.  Rest as needed.  Do not: ? Participate in activities in which you could fall or become injured. ? Drive. ? Use heavy machinery. ? Drink alcohol. ? Take sleeping pills or medicines that cause drowsiness. ? Make important decisions or sign legal documents. ? Take care of children on your own. Eating and drinking  Follow any instructions from your health care provider about eating or drinking restrictions.  When you feel hungry, start by eating small amounts of foods that are soft and easy to digest (bland), such as toast. Gradually return to your regular diet.  Drink enough fluid to keep your urine pale yellow.  If you vomit, rehydrate by drinking water, juice, or clear broth. General instructions  If you have sleep apnea, surgery and certain medicines can increase your risk for breathing problems. Follow instructions from your health care provider about wearing your sleep device: ? Anytime you are sleeping, including during daytime naps. ? While taking prescription pain medicines, sleeping medicines, or medicines that make you  drowsy.  Return to your normal activities as told by your health care provider. Ask your health care provider what activities are safe for you.  Take over-the-counter and prescription medicines only as told by your health care provider.  If you smoke, do not smoke without supervision.  Keep all follow-up visits as told by your health care provider. This is important. Contact a health care provider if:  You have nausea or vomiting that does not get better with medicine.  You cannot eat or drink without vomiting.  You have pain that does not get better with medicine.  You are unable to pass urine.  You develop a skin rash.  You have a fever.  You have redness around your IV site that gets worse. Get help right away if:  You have difficulty breathing.  You have chest pain.  You have blood in your urine or stool, or you vomit blood. Summary  After the procedure, it is common to have a sore throat or nausea. It is also common to feel tired.  Have a responsible adult stay with you for the first 24 hours after general anesthesia. It is important to have someone help care for you until you are awake and alert.  When you feel hungry, start by eating small amounts of foods that are soft and easy to digest (bland), such as toast. Gradually return to your regular diet.  Drink enough fluid to keep your urine pale yellow.  Return to your normal activities as told by your health care provider. Ask your health care provider what activities are safe for you. This information is not intended to replace advice given to you by your health care provider. Make sure you discuss any questions you have with your health care provider. Document Released: 10/01/2000 Document Revised: 06/28/2017 Document Reviewed: 02/08/2017 Elsevier Patient Education  2020 [employer]. How to Use Chlorhexidine for Bathing Chlorhexidine gluconate (CHG) is a germ-killing (antiseptic) solution that is used to clean  the skin. It can get rid of the bacteria that normally live on the skin and can keep them away for about 24 hours. To clean your skin with CHG, you may be given:  A CHG solution to use in the shower or as part of a sponge bath.  A prepackaged cloth that contains CHG. Cleaning your skin with CHG may help lower the risk for  infection:  While you are staying in the intensive care unit of the hospital.  If you have a vascular access, such as a central line, to provide short-term or long-term access to your veins.  If you have a catheter to drain urine from your bladder.  If you are on a ventilator. A ventilator is a machine that helps you breathe by moving air in and out of your lungs.  After surgery. What are the risks? Risks of using CHG include:  A skin reaction.  Hearing loss, if CHG gets in your ears.  Eye injury, if CHG gets in your eyes and is not rinsed out.  The CHG product catching fire. Make sure that you avoid smoking and flames after applying CHG to your skin. Do not use CHG:  If you have a chlorhexidine allergy or have previously reacted to chlorhexidine.  On babies younger than 60 months of age. How to use CHG solution  Use CHG only as told by your health care provider, and follow the instructions on the label.  Use the full amount of CHG as directed. Usually, this is one bottle. During a shower Follow these steps when using CHG solution during a shower (unless your health care provider gives you different instructions): 1. Start the shower. 2. Use your normal soap and shampoo to wash your face and hair. 3. Turn off the shower or move out of the shower stream. 4. Pour the CHG onto a clean washcloth. Do not use any type of brush or rough-edged sponge. 5. Starting at your neck, lather your body down to your toes. Make sure you follow these instructions: ? If you will be having surgery, pay special attention to the part of your body where you will be having surgery.  Scrub this area for at least 1 minute. ? Do not use CHG on your head or face. If the solution gets into your ears or eyes, rinse them well with water. ? Avoid your genital area. ? Avoid any areas of skin that have broken skin, cuts, or scrapes. ? Scrub your back and under your arms. Make sure to wash skin folds. 6. Let the lather sit on your skin for 1-2 minutes or as long as told by your health care provider. 7. Thoroughly rinse your entire body in the shower. Make sure that all body creases and crevices are rinsed well. 8. Dry off with a clean towel. Do not put any substances on your body afterward--such as powder, lotion, or perfume--unless you are told to do so by your health care provider. Only use lotions that are recommended by the manufacturer. 9. Put on clean clothes or pajamas. 10. If it is the night before your surgery, sleep in clean sheets.  During a sponge bath Follow these steps when using CHG solution during a sponge bath (unless your health care provider gives you different instructions): 1. Use your normal soap and shampoo to wash your face and hair. 2. Pour the CHG onto a clean washcloth. 3. Starting at your neck, lather your body down to your toes. Make sure you follow these instructions: ? If you will be having surgery, pay special attention to the part of your body where you will be having surgery. Scrub this area for at least 1 minute. ? Do not use CHG on your head or face. If the solution gets into your ears or eyes, rinse them well with water. ? Avoid your genital area. ? Avoid any areas of skin that  have broken skin, cuts, or scrapes. ? Scrub your back and under your arms. Make sure to wash skin folds. 4. Let the lather sit on your skin for 1-2 minutes or as long as told by your health care provider. 5. Using a different clean, wet washcloth, thoroughly rinse your entire body. Make sure that all body creases and crevices are rinsed well. 6. Dry off with a clean towel.  Do not put any substances on your body afterward--such as powder, lotion, or perfume--unless you are told to do so by your health care provider. Only use lotions that are recommended by the manufacturer. 7. Put on clean clothes or pajamas. 8. If it is the night before your surgery, sleep in clean sheets. How to use CHG prepackaged cloths  Only use CHG cloths as told by your health care provider, and follow the instructions on the label.  Use the CHG cloth on clean, dry skin.  Do not use the CHG cloth on your head or face unless your health care provider tells you to.  When washing with the CHG cloth: ? Avoid your genital area. ? Avoid any areas of skin that have broken skin, cuts, or scrapes. Before surgery Follow these steps when using a CHG cloth to clean before surgery (unless your health care provider gives you different instructions): 1. Using the CHG cloth, vigorously scrub the part of your body where you will be having surgery. Scrub using a back-and-forth motion for 3 minutes. The area on your body should be completely wet with CHG when you are done scrubbing. 2. Do not rinse. Discard the cloth and let the area air-dry. Do not put any substances on the area afterward, such as powder, lotion, or perfume. 3. Put on clean clothes or pajamas. 4. If it is the night before your surgery, sleep in clean sheets.  For general bathing Follow these steps when using CHG cloths for general bathing (unless your health care provider gives you different instructions). 1. Use a separate CHG cloth for each area of your body. Make sure you wash between any folds of skin and between your fingers and toes. Wash your body in the following order, switching to a new cloth after each step: ? The front of your neck, shoulders, and chest. ? Both of your arms, under your arms, and your hands. ? Your stomach and groin area, avoiding the genitals. ? Your right leg and foot. ? Your left leg and foot. ? The back  of your neck, your back, and your buttocks. 2. Do not rinse. Discard the cloth and let the area air-dry. Do not put any substances on your body afterward--such as powder, lotion, or perfume--unless you are told to do so by your health care provider. Only use lotions that are recommended by the manufacturer. 3. Put on clean clothes or pajamas. Contact a health care provider if:  Your skin gets irritated after scrubbing.  You have questions about using your solution or cloth. Get help right away if:  Your eyes become very red or swollen.  Your eyes itch badly.  Your skin itches badly and is red or swollen.  Your hearing changes.  You have trouble seeing.  You have swelling or tingling in your mouth or throat.  You have trouble breathing.  You swallow any chlorhexidine. Summary  Chlorhexidine gluconate (CHG) is a germ-killing (antiseptic) solution that is used to clean the skin. Cleaning your skin with CHG may help to lower your risk for infection.  You may be given CHG to use for bathing. It may be in a bottle or in a prepackaged cloth to use on your skin. Carefully follow your health care provider's instructions and the instructions on the product label.  Do not use CHG if you have a chlorhexidine allergy.  Contact your health care provider if your skin gets irritated after scrubbing. This information is not intended to replace advice given to you by your health care provider. Make sure you discuss any questions you have with your health care provider. Document Released: 03/19/2012 Document Revised: 09/11/2018 Document Reviewed: 05/23/2017 Elsevier Patient Education  2020 [employer].

## 2019-05-21 ENCOUNTER — Encounter (HOSPITAL_COMMUNITY)
Admission: RE | Admit: 2019-05-21 | Discharge: 2019-05-21 | Disposition: A | Discharge status: Home / Self Care | Payer: Medicare HMO | Source: Ambulatory Visit | Attending: Urology | Admitting: Urology

## 2019-05-21 ENCOUNTER — Other Ambulatory Visit: Payer: Self-pay

## 2019-05-21 ENCOUNTER — Other Ambulatory Visit (HOSPITAL_COMMUNITY)
Admission: RE | Admit: 2019-05-21 | Discharge: 2019-05-21 | Disposition: A | Discharge status: Home / Self Care | Payer: Medicare HMO | Source: Ambulatory Visit | Attending: Urology | Admitting: Urology

## 2019-05-22 ENCOUNTER — Other Ambulatory Visit: Payer: Self-pay

## 2019-05-22 ENCOUNTER — Encounter (HOSPITAL_COMMUNITY)
Admission: RE | Admit: 2019-05-22 | Discharge: 2019-05-22 | Disposition: A | Discharge status: Home / Self Care | Payer: Medicare HMO | Source: Ambulatory Visit | Attending: Urology | Admitting: Urology

## 2019-05-22 ENCOUNTER — Encounter (HOSPITAL_COMMUNITY): Payer: Self-pay

## 2019-05-22 ENCOUNTER — Other Ambulatory Visit (HOSPITAL_COMMUNITY)
Admission: RE | Admit: 2019-05-22 | Discharge: 2019-05-22 | Disposition: A | Discharge status: Home / Self Care | Payer: Medicare HMO | Source: Ambulatory Visit | Attending: Urology | Admitting: Urology

## 2019-05-22 DIAGNOSIS — N400 Enlarged prostate without lower urinary tract symptoms: Secondary | ICD-10-CM | POA: Insufficient documentation

## 2019-05-22 DIAGNOSIS — Z20828 Contact with and (suspected) exposure to other viral communicable diseases: Secondary | ICD-10-CM | POA: Insufficient documentation

## 2019-05-22 DIAGNOSIS — Z01818 Encounter for other preprocedural examination: Secondary | ICD-10-CM | POA: Diagnosis not present

## 2019-05-22 LAB — CBC
HCT: 33.4 % — ABNORMAL LOW (ref 39.0–52.0)
Hemoglobin: 11.2 g/dL — ABNORMAL LOW (ref 13.0–17.0)
MCH: 31.7 pg (ref 26.0–34.0)
MCHC: 33.5 g/dL (ref 30.0–36.0)
MCV: 94.6 fL (ref 80.0–100.0)
Platelets: 191 10*3/uL (ref 150–400)
RBC: 3.53 MIL/uL — ABNORMAL LOW (ref 4.22–5.81)
RDW: 13 % (ref 11.5–15.5)
WBC: 3.3 10*3/uL — ABNORMAL LOW (ref 4.0–10.5)
nRBC: 0 % (ref 0.0–0.2)

## 2019-05-22 LAB — BASIC METABOLIC PANEL
Anion gap: 8 (ref 5–15)
BUN: 13 mg/dL (ref 8–23)
CO2: 24 mmol/L (ref 22–32)
Calcium: 9.2 mg/dL (ref 8.9–10.3)
Chloride: 106 mmol/L (ref 98–111)
Creatinine, Ser: 1.08 mg/dL (ref 0.61–1.24)
GFR calc Af Amer: >60 mL/min (ref >60)
GFR calc non Af Amer: >60 mL/min (ref >60)
Glucose, Bld: 159 mg/dL — ABNORMAL HIGH (ref 70–99)
Potassium: 4 mmol/L (ref 3.5–5.1)
Sodium: 138 mmol/L (ref 135–145)

## 2019-05-22 LAB — GLUCOSE, CAPILLARY: Glucose-Capillary: 148 mg/dL — ABNORMAL HIGH (ref 70–99)

## 2019-05-22 LAB — HEMOGLOBIN A1C
Hgb A1c MFr Bld: 7.2 % — ABNORMAL HIGH (ref 4.8–5.6)
Mean Plasma Glucose: 159.94 mg/dL

## 2019-05-22 LAB — SARS CORONAVIRUS 2 (TAT 6-24 HRS): SARS Coronavirus 2: NEGATIVE

## 2019-05-25 ENCOUNTER — Observation Stay (HOSPITAL_COMMUNITY)
Admission: RE | Admit: 2019-05-25 | Discharge: 2019-05-26 | Disposition: A | Discharge status: Home / Self Care | Payer: Medicare HMO | Attending: Urology | Admitting: Urology

## 2019-05-25 ENCOUNTER — Ambulatory Visit (HOSPITAL_COMMUNITY): Payer: Medicare HMO | Admitting: Certified Registered Nurse Anesthetist

## 2019-05-25 ENCOUNTER — Encounter (HOSPITAL_COMMUNITY): Payer: Self-pay

## 2019-05-25 ENCOUNTER — Other Ambulatory Visit: Payer: Self-pay

## 2019-05-25 ENCOUNTER — Encounter (HOSPITAL_COMMUNITY)
Admission: RE | Disposition: A | Discharge status: Home / Self Care | Payer: Self-pay | Source: Home / Self Care | Attending: Urology

## 2019-05-25 DIAGNOSIS — M479 Spondylosis, unspecified: Secondary | ICD-10-CM | POA: Insufficient documentation

## 2019-05-25 DIAGNOSIS — Z833 Family history of diabetes mellitus: Secondary | ICD-10-CM | POA: Insufficient documentation

## 2019-05-25 DIAGNOSIS — K219 Gastro-esophageal reflux disease without esophagitis: Secondary | ICD-10-CM | POA: Insufficient documentation

## 2019-05-25 DIAGNOSIS — G4730 Sleep apnea, unspecified: Secondary | ICD-10-CM | POA: Insufficient documentation

## 2019-05-25 DIAGNOSIS — I10 Essential (primary) hypertension: Secondary | ICD-10-CM | POA: Insufficient documentation

## 2019-05-25 DIAGNOSIS — E119 Type 2 diabetes mellitus without complications: Secondary | ICD-10-CM | POA: Diagnosis not present

## 2019-05-25 DIAGNOSIS — Z79899 Other long term (current) drug therapy: Secondary | ICD-10-CM | POA: Insufficient documentation

## 2019-05-25 DIAGNOSIS — N138 Other obstructive and reflux uropathy: Secondary | ICD-10-CM | POA: Insufficient documentation

## 2019-05-25 DIAGNOSIS — M19042 Primary osteoarthritis, left hand: Secondary | ICD-10-CM | POA: Insufficient documentation

## 2019-05-25 DIAGNOSIS — C61 Malignant neoplasm of prostate: Secondary | ICD-10-CM | POA: Diagnosis not present

## 2019-05-25 DIAGNOSIS — N400 Enlarged prostate without lower urinary tract symptoms: Secondary | ICD-10-CM | POA: Diagnosis present

## 2019-05-25 DIAGNOSIS — Z886 Allergy status to analgesic agent status: Secondary | ICD-10-CM | POA: Diagnosis not present

## 2019-05-25 DIAGNOSIS — Z7984 Long term (current) use of oral hypoglycemic drugs: Secondary | ICD-10-CM | POA: Insufficient documentation

## 2019-05-25 DIAGNOSIS — M19041 Primary osteoarthritis, right hand: Secondary | ICD-10-CM | POA: Insufficient documentation

## 2019-05-25 DIAGNOSIS — N401 Enlarged prostate with lower urinary tract symptoms: Secondary | ICD-10-CM

## 2019-05-25 DIAGNOSIS — Z8249 Family history of ischemic heart disease and other diseases of the circulatory system: Secondary | ICD-10-CM | POA: Insufficient documentation

## 2019-05-25 HISTORY — PX: TRANSURETHRAL RESECTION OF PROSTATE: SHX73

## 2019-05-25 LAB — BASIC METABOLIC PANEL
Anion gap: 9 (ref 5–15)
BUN: 9 mg/dL (ref 8–23)
CO2: 27 mmol/L (ref 22–32)
Calcium: 9.1 mg/dL (ref 8.9–10.3)
Chloride: 102 mmol/L (ref 98–111)
Creatinine, Ser: 0.76 mg/dL (ref 0.61–1.24)
GFR calc Af Amer: >60 mL/min (ref >60)
GFR calc non Af Amer: >60 mL/min (ref >60)
Glucose, Bld: 112 mg/dL — ABNORMAL HIGH (ref 70–99)
Potassium: 4.2 mmol/L (ref 3.5–5.1)
Sodium: 138 mmol/L (ref 135–145)

## 2019-05-25 LAB — CBC
HCT: 32.4 % — ABNORMAL LOW (ref 39.0–52.0)
Hemoglobin: 11.1 g/dL — ABNORMAL LOW (ref 13.0–17.0)
MCH: 32.4 pg (ref 26.0–34.0)
MCHC: 34.3 g/dL (ref 30.0–36.0)
MCV: 94.5 fL (ref 80.0–100.0)
Platelets: 166 10*3/uL (ref 150–400)
RBC: 3.43 MIL/uL — ABNORMAL LOW (ref 4.22–5.81)
RDW: 12.8 % (ref 11.5–15.5)
WBC: 5.1 10*3/uL (ref 4.0–10.5)
nRBC: 0 % (ref 0.0–0.2)

## 2019-05-25 LAB — GLUCOSE, CAPILLARY
Glucose-Capillary: 111 mg/dL — ABNORMAL HIGH (ref 70–99)
Glucose-Capillary: 124 mg/dL — ABNORMAL HIGH (ref 70–99)
Glucose-Capillary: 178 mg/dL — ABNORMAL HIGH (ref 70–99)
Glucose-Capillary: 132 mg/dL — ABNORMAL HIGH (ref 70–99)
Glucose-Capillary: 170 mg/dL — ABNORMAL HIGH (ref 70–99)

## 2019-05-25 SURGERY — TURP (TRANSURETHRAL RESECTION OF PROSTATE)
Anesthesia: General

## 2019-05-25 MED ORDER — BELLADONNA ALKALOIDS-OPIUM 16.2-60 MG RE SUPP: 1.0000 | Freq: Four times a day (QID) | RECTAL | Status: DC | PRN

## 2019-05-25 MED ORDER — ONDANSETRON HCL 4 MG/2ML IJ SOLN: 4.0000 mg | Freq: Once | INTRAMUSCULAR | Status: DC | PRN

## 2019-05-25 MED ORDER — DIPHENHYDRAMINE HCL 50 MG/ML IJ SOLN: 12.5000 mg | Freq: Four times a day (QID) | INTRAMUSCULAR | Status: DC | PRN

## 2019-05-25 MED ORDER — LACTATED RINGERS IV SOLN
Freq: Once | INTRAVENOUS | Status: AC
  Administered 2019-05-25: 08:00:00 via INTRAVENOUS

## 2019-05-25 MED ORDER — DIPHENHYDRAMINE HCL 12.5 MG/5ML PO ELIX: 12.5000 mg | ORAL_SOLUTION | Freq: Four times a day (QID) | ORAL | Status: DC | PRN

## 2019-05-25 MED ORDER — GLYCOPYRROLATE PF 0.2 MG/ML IJ SOSY
PREFILLED_SYRINGE | INTRAMUSCULAR | Status: AC
  Filled 2019-05-25: qty 2

## 2019-05-25 MED ORDER — EPHEDRINE 5 MG/ML INJ
INTRAVENOUS | Status: AC
  Filled 2019-05-25: qty 10

## 2019-05-25 MED ORDER — INSULIN ASPART 100 UNIT/ML ~~LOC~~ SOLN
0.0000 [IU] | Freq: Three times a day (TID) | SUBCUTANEOUS | Status: DC
  Administered 2019-05-25: 2 [IU] via SUBCUTANEOUS
  Administered 2019-05-26: 5 [IU] via SUBCUTANEOUS

## 2019-05-25 MED ORDER — FENTANYL CITRATE (PF) 100 MCG/2ML IJ SOLN
INTRAMUSCULAR | Status: AC
  Filled 2019-05-25: qty 2

## 2019-05-25 MED ORDER — LIDOCAINE 2% (20 MG/ML) 5 ML SYRINGE
INTRAMUSCULAR | Status: AC
  Filled 2019-05-25: qty 5

## 2019-05-25 MED ORDER — HYDROMORPHONE HCL 1 MG/ML IJ SOLN: 0.2500 mg | INTRAMUSCULAR | Status: DC | PRN

## 2019-05-25 MED ORDER — RAMIPRIL 5 MG PO CAPS
10.0000 mg | ORAL_CAPSULE | Freq: Every day | ORAL | Status: DC
  Administered 2019-05-25 – 2019-05-26 (×2): 10 mg via ORAL
  Filled 2019-05-25 (×2): qty 2

## 2019-05-25 MED ORDER — SODIUM CHLORIDE 0.9 % IR SOLN
Status: DC | PRN
  Administered 2019-05-25 (×5): 3000 mL

## 2019-05-25 MED ORDER — SODIUM CHLORIDE 0.9 % IR SOLN
3000.0000 mL | Status: DC
  Administered 2019-05-25: 3000 mL

## 2019-05-25 MED ORDER — PANTOPRAZOLE SODIUM 40 MG PO TBEC
40.0000 mg | DELAYED_RELEASE_TABLET | Freq: Every day | ORAL | Status: DC
  Administered 2019-05-25 – 2019-05-26 (×2): 40 mg via ORAL
  Filled 2019-05-25 (×2): qty 1

## 2019-05-25 MED ORDER — WATER FOR IRRIGATION, STERILE IR SOLN
Status: DC | PRN
  Administered 2019-05-25: 500 mL

## 2019-05-25 MED ORDER — LABETALOL HCL 200 MG PO TABS
200.0000 mg | ORAL_TABLET | Freq: Two times a day (BID) | ORAL | Status: DC
  Administered 2019-05-25 – 2019-05-26 (×2): 200 mg via ORAL
  Filled 2019-05-25 (×2): qty 1

## 2019-05-25 MED ORDER — ONDANSETRON HCL 4 MG/2ML IJ SOLN: 4.0000 mg | INTRAMUSCULAR | Status: DC | PRN

## 2019-05-25 MED ORDER — FENTANYL CITRATE (PF) 100 MCG/2ML IJ SOLN
INTRAMUSCULAR | Status: DC | PRN
  Administered 2019-05-25 (×3): 50 ug via INTRAVENOUS

## 2019-05-25 MED ORDER — PROPOFOL 10 MG/ML IV BOLUS: INTRAVENOUS | Status: DC | PRN

## 2019-05-25 MED ORDER — ACETAMINOPHEN 325 MG PO TABS: 650.0000 mg | ORAL_TABLET | ORAL | Status: DC | PRN

## 2019-05-25 MED ORDER — INSULIN ASPART 100 UNIT/ML ~~LOC~~ SOLN: 0.0000 [IU] | Freq: Every day | SUBCUTANEOUS | Status: DC

## 2019-05-25 MED ORDER — HYDROMORPHONE HCL 1 MG/ML IJ SOLN: 0.5000 mg | INTRAMUSCULAR | Status: DC | PRN

## 2019-05-25 MED ORDER — ZOLPIDEM TARTRATE 5 MG PO TABS: 5.0000 mg | ORAL_TABLET | Freq: Every evening | ORAL | Status: DC | PRN

## 2019-05-25 MED ORDER — AMLODIPINE BESYLATE 5 MG PO TABS
10.0000 mg | ORAL_TABLET | Freq: Every day | ORAL | Status: DC
  Administered 2019-05-25 – 2019-05-26 (×2): 10 mg via ORAL
  Filled 2019-05-25 (×2): qty 2

## 2019-05-25 MED ORDER — LIDOCAINE HCL (CARDIAC) PF 50 MG/5ML IV SOSY
PREFILLED_SYRINGE | INTRAVENOUS | Status: DC | PRN
  Administered 2019-05-25: 50 mg via INTRAVENOUS

## 2019-05-25 MED ORDER — SUCCINYLCHOLINE 20MG/ML (10ML) SYRINGE FOR MEDFUSION PUMP - OPTIME
INTRAMUSCULAR | Status: DC | PRN
  Administered 2019-05-25: 30 mg via INTRAVENOUS
  Administered 2019-05-25: 120 mg via INTRAVENOUS

## 2019-05-25 MED ORDER — ROCURONIUM BROMIDE 10 MG/ML (PF) SYRINGE
PREFILLED_SYRINGE | INTRAVENOUS | Status: AC
  Filled 2019-05-25: qty 20

## 2019-05-25 MED ORDER — GLYCOPYRROLATE 0.2 MG/ML IJ SOLN
INTRAMUSCULAR | Status: DC | PRN
  Administered 2019-05-25: .2 mg via INTRAVENOUS

## 2019-05-25 MED ORDER — PROPOFOL 10 MG/ML IV BOLUS
INTRAVENOUS | Status: DC | PRN
  Administered 2019-05-25: 160 mg via INTRAVENOUS

## 2019-05-25 MED ORDER — OXYCODONE HCL 5 MG PO TABS: 5.0000 mg | ORAL_TABLET | ORAL | Status: DC | PRN

## 2019-05-25 MED ORDER — SODIUM CHLORIDE 0.9 % IV SOLN
INTRAVENOUS | Status: DC
  Administered 2019-05-25: 17:00:00 via INTRAVENOUS

## 2019-05-25 MED ORDER — SUCCINYLCHOLINE CHLORIDE 200 MG/10ML IV SOSY
PREFILLED_SYRINGE | INTRAVENOUS | Status: AC
  Filled 2019-05-25: qty 10

## 2019-05-25 MED ORDER — PROPOFOL 10 MG/ML IV BOLUS
INTRAVENOUS | Status: AC
  Filled 2019-05-25: qty 40

## 2019-05-25 MED ORDER — SIMVASTATIN 20 MG PO TABS
20.0000 mg | ORAL_TABLET | Freq: Every day | ORAL | Status: DC
  Administered 2019-05-25 – 2019-05-26 (×2): 20 mg via ORAL
  Filled 2019-05-25 (×2): qty 1

## 2019-05-25 MED ORDER — ASPIRIN EC 81 MG PO TBEC
81.0000 mg | DELAYED_RELEASE_TABLET | Freq: Every day | ORAL | Status: DC
  Administered 2019-05-25 – 2019-05-26 (×2): 81 mg via ORAL
  Filled 2019-05-25 (×2): qty 1

## 2019-05-25 MED ORDER — CEFAZOLIN SODIUM-DEXTROSE 2-4 GM/100ML-% IV SOLN
2.0000 g | INTRAVENOUS | Status: AC
  Administered 2019-05-25: 2 g via INTRAVENOUS
  Filled 2019-05-25: qty 100

## 2019-05-25 SURGICAL SUPPLY — 25 items
BAG DRAIN URO TABLE W/ADPT NS (BAG) ×3 IMPLANT
BAG DRN 8 ADPR NS SKTRN CSTL (BAG) ×1
BAG DRN URN TUBE DRIP CHMBR (OSTOMY) ×1
BAG URINE DRAIN TURP 4L (OSTOMY) ×3 IMPLANT
CATH FOLEY 3WAY 30CC 20FR (CATHETERS) ×2 IMPLANT
CATH FOLEY 3WAY 30CC 22F (CATHETERS) ×3 IMPLANT
CLOTH BEACON ORANGE TIMEOUT ST (SAFETY) ×3 IMPLANT
ELECT REM PT RETURN 9FT ADLT (ELECTROSURGICAL) ×3
ELECTRODE REM PT RTRN 9FT ADLT (ELECTROSURGICAL) ×1 IMPLANT
GLOVE BIO SURGEON STRL SZ8 (GLOVE) ×3 IMPLANT
GLOVE BIOGEL PI IND STRL 7.0 (GLOVE) IMPLANT
GLOVE BIOGEL PI INDICATOR 7.0 (GLOVE) ×6
GLOVE ECLIPSE 6.5 STRL STRAW (GLOVE) ×2 IMPLANT
GOWN STRL REUS W/ TWL XL LVL3 (GOWN DISPOSABLE) ×1 IMPLANT
GOWN STRL REUS W/TWL LRG LVL3 (GOWN DISPOSABLE) ×3 IMPLANT
GOWN STRL REUS W/TWL XL LVL3 (GOWN DISPOSABLE) ×3
IV NS IRRIG 3000ML ARTHROMATIC (IV SOLUTION) ×18 IMPLANT
KIT TURNOVER CYSTO (KITS) ×3 IMPLANT
LOOP CUT BIPOLAR 24F LRG (ELECTROSURGICAL) ×3 IMPLANT
MANIFOLD NEPTUNE II (INSTRUMENTS) ×3 IMPLANT
PACK CYSTO (CUSTOM PROCEDURE TRAY) ×3 IMPLANT
PAD ARMBOARD 7.5X6 YLW CONV (MISCELLANEOUS) ×3 IMPLANT
SYR 30ML LL (SYRINGE) ×3 IMPLANT
SYRINGE IRR TOOMEY STRL 70CC (SYRINGE) ×3 IMPLANT
WATER STERILE IRR 500ML POUR (IV SOLUTION) ×3 IMPLANT

## 2019-05-25 NOTE — Progress Notes (Addendum)
   Patient on hold from PACU to await bed assignment, placed in pre-operative area . Lab work completed, blood glucose 159. Given beverage and crackers, denies pain. CBI infusing well, bladder soft pink tinged.  1520 Bed assignment obtained report called to [facility]  For room 330

## 2019-05-25 NOTE — Discharge Instructions (Signed)
Indwelling Urinary Catheter Care, Adult °An indwelling urinary catheter is a thin tube that is put into your bladder. The tube helps to drain pee (urine) out of your body. The tube goes in through your urethra. Your urethra is where pee comes out of your body. Your pee will come out through the catheter, then it will go into a bag (drainage bag). °Take good care of your catheter so it will work well. °How to wear your catheter and bag °Supplies needed °· Sticky tape (adhesive tape) or a leg strap. °· Alcohol wipe or soap and water (if you use tape). °· A clean towel (if you use tape). °· Large overnight bag. °· Smaller bag (leg bag). °Wearing your catheter °Attach your catheter to your leg with tape or a leg strap. °· Make sure the catheter is not pulled tight. °· If a leg strap gets wet, take it off and put on a dry strap. °· If you use tape to hold the bag on your leg: °1. Use an alcohol wipe or soap and water to wash your skin where the tape made it sticky before. °2. Use a clean towel to pat-dry that skin. °3. Use new tape to make the bag stay on your leg. °Wearing your bags °You should have been given a large overnight bag. °· You may wear the overnight bag in the day or night. °· Always have the overnight bag lower than your bladder.  Do not let the bag touch the floor. °· Before you go to sleep, put a clean plastic bag in a wastebasket. Then hang the overnight bag inside the wastebasket. °You should also have a smaller leg bag that fits under your clothes. °· Always wear the leg bag below your knee. °· Do not wear your leg bag at night. °How to care for your skin and catheter °Supplies needed °· A clean washcloth. °· Water and mild soap. °· A clean towel. °Caring for your skin and catheter ° °  ° °· Clean the skin around your catheter every day: °? Wash your hands with soap and water. °? Wet a clean washcloth in warm water and mild soap. °? Clean the skin around your urethra. °? If you are male: °? Gently  spread the folds of skin around your vagina (labia). °? With the washcloth in your other hand, wipe the inner side of your labia on each side. Wipe from front to back. °? If you are male: °? Pull back any skin that covers the end of your penis (foreskin). °? With the washcloth in your other hand, wipe your penis in small circles. Start wiping at the tip of your penis, then move away from the catheter. °? Move the foreskin back in place, if needed. °? With your free hand, hold the catheter close to where it goes into your body. °? Keep holding the catheter during cleaning so it does not get pulled out. °? With the washcloth in your other hand, clean the catheter. °? Only wipe downward on the catheter. °? Do not wipe upward toward your body. Doing this may push germs into your urethra and cause infection. °? Use a clean towel to pat-dry the catheter and the skin around it. Make sure to wipe off all soap. °? Wash your hands with soap and water. °· Shower every day. Do not take baths. °· Do not use cream, ointment, or lotion on the area where the catheter goes into your body, unless your doctor tells you   to. °· Do not use powders, sprays, or lotions on your genital area. °· Check your skin around the catheter every day for signs of infection. Check for: °? Redness, swelling, or pain. °? Fluid or blood. °? Warmth. °? Pus or a bad smell. °How to empty the bag °Supplies needed °· Rubbing alcohol. °· Gauze pad or cotton ball. °· Tape or a leg strap. °Emptying the bag °Pour the pee out of your bag when it is ?-½ full, or at least 2-3 times a day. Do this for your overnight bag and your leg bag. °1. Wash your hands with soap and water. °2. Separate (detach) the bag from your leg. °3. Hold the bag over the toilet or a clean pail. Keep the bag lower than your hips and bladder. This is so the pee (urine) does not go back into the tube. °4. Open the pour spout. It is at the bottom of the bag. °5. Empty the pee into the toilet or  pail. Do not let the pour spout touch any surface. °6. Put rubbing alcohol on a gauze pad or cotton ball. °7. Use the gauze pad or cotton ball to clean the pour spout. °8. Close the pour spout. °9. Attach the bag to your leg with tape or a leg strap. °10. Wash your hands with soap and water. °Follow instructions for cleaning the drainage bag: °· From the product maker. °· As told by your doctor. °How to change the bag °Supplies needed °· Alcohol wipes. °· A clean bag. °· Tape or a leg strap. °Changing the bag °Replace your bag when it starts to leak, smell bad, or look dirty. °1. Wash your hands with soap and water. °2. Separate the dirty bag from your leg. °3. Pinch the catheter with your fingers so that pee does not spill out. °4. Separate the catheter tube from the bag tube where these tubes connect (at the connection valve). Do not let the tubes touch any surface. °5. Clean the end of the catheter tube with an alcohol wipe. Use a different alcohol wipe to clean the end of the bag tube. °6. Connect the catheter tube to the tube of the clean bag. °7. Attach the clean bag to your leg with tape or a leg strap. Do not make the bag tight on your leg. °8. Wash your hands with soap and water. °General rules ° °· Never pull on your catheter. Never try to take it out. Doing that can hurt you. °· Always wash your hands before and after you touch your catheter or bag. Use a mild, fragrance-free soap. If you do not have soap and water, use hand sanitizer. °· Always make sure there are no twists or bends (kinks) in the catheter tube. °· Always make sure there are no leaks in the catheter or bag. °· Drink enough fluid to keep your pee pale yellow. °· Do not take baths, swim, or use a hot tub. °· If you are male, wipe from front to back after you poop (have a bowel movement). °Contact a doctor if: °· Your pee is cloudy. °· Your pee smells worse than usual. °· Your catheter gets clogged. °· Your catheter leaks. °· Your bladder  feels full. °Get help right away if: °· You have redness, swelling, or pain where the catheter goes into your body. °· You have fluid, blood, pus, or a bad smell coming from the area where the catheter goes into your body. °· Your skin feels warm where   the catheter goes into your body. °· You have a fever. °· You have pain in your: °? Belly (abdomen). °? Legs. °? Lower back. °? Bladder. °· You see blood in the catheter. °· Your pee is pink or red. °· You feel sick to your stomach (nauseous). °· You throw up (vomit). °· You have chills. °· Your pee is not draining into the bag. °· Your catheter gets pulled out. °Summary °· An indwelling urinary catheter is a thin tube that is placed into the bladder to help drain pee (urine) out of the body. °· The catheter is placed into the part of the body that drains pee from the bladder (urethra). °· Taking good care of your catheter will keep it working properly and help prevent problems. °· Always wash your hands before and after touching your catheter or bag. °· Never pull on your catheter or try to take it out. °This information is not intended to replace advice given to you by your health care provider. Make sure you discuss any questions you have with your health care provider. °Document Released: 10/20/2012 Document Revised: 10/17/2018 Document Reviewed: 02/08/2017 °Elsevier Patient Education © 2020 [employer] ° °

## 2019-05-25 NOTE — H&P (Signed)
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 He has severe LUTS. No dysuria, no hematuria. No fevers/chills/sweats. No nausea/vomiting  Past Medical History:  Diagnosis Date  . Arthritis    fingers, back  . Chronic back pain   . Diabetes mellitus Since 1995   Takes Glucovance and Onglyza daily  . Diabetes mellitus without complication ([place])   . GERD (gastroesophageal reflux disease)    takes Dexilant and Omeprazole daily  . Herpes   . History of colon polyps   . History of gastric ulcer 25+yrs ago  . Hyperlipidemia Since 1995   takes Simvastatin daily  . Hypertension Since 1995   takes Metoprolol and Ramipril daily  . Hypertension   . Pneumonia    hx of;as a child  . Sleep apnea    doesn't use CPAP  . Urinary frequency   . Urinary urgency    takes Flomax daily   Past Surgical History:  Procedure Laterality Date  . BACK SURGERY  1962   Lumbar  . BACK SURGERY    . bilateral cataract surgery    . CARDIAC CATHETERIZATION  02/16/10  . CERVICAL SPINE SURGERY    . COLONOSCOPY    . COLONOSCOPY N/A 04/23/2013   Procedure: COLONOSCOPY;  Surgeon: Rogene Houston, MD;  [facility];  Service: Endoscopy;  Laterality: N/A;  1030  . COLONOSCOPY N/A 07/24/2018   Procedure: COLONOSCOPY;  Surgeon: Rogene Houston, MD;  Location: [facility];  Service: Endoscopy;  Laterality: N/A;  8:30  . ESOPHAGOGASTRODUODENOSCOPY N/A 12/24/2014   Procedure: ESOPHAGOGASTRODUODENOSCOPY (EGD);  Surgeon: Rogene Houston, MD;  Location: [facility];  Service: Endoscopy;  Laterality: N/A;  2:45  . ESOPHAGOGASTRODUODENOSCOPY (EGD) WITH ESOPHAGEAL DILATION    . HAND SURGERY     Right  . LUMBAR LAMINECTOMY/DECOMPRESSION MICRODISCECTOMY N/A 08/29/2012   Procedure: LUMBAR LAMINECTOMY/DECOMPRESSION MICRODISCECTOMY 1 LEVEL;  Surgeon: Floyce Stakes, MD;  Location: [facility];  Service:  Neurosurgery;  Laterality: N/A;  Lumbar four-five laminectomies  . NECK SURGERY    . PENILE PROSTHESIS IMPLANT    . ROTATOR CUFF REPAIR     Right  . SHOULDER SURGERY    . WRIST SURGERY     Left  . WRIST SURGERY      Home Medications:  Current Facility-Administered Medications  Medication Dose Route Frequency Provider Last Rate Last Dose  . ceFAZolin (ANCEF) IVPB 2g/100 mL premix  2 g Intravenous 30 min Pre-Op Worth Kober, Candee Furbish, MD      . lactated ringers infusion   Intravenous Once Denese Killings, MD       Allergies:  Allergies  Allergen Reactions  . Aspirin Other (See Comments)    Higher dosage upsets stomach  . Bayer Aspirin [Aspirin] Other (See Comments)    Stomach "flares up"    Family History  Problem Relation Age of Onset  . Heart attack Father 58       MI  . Hypertension Father   . Heart attack Brother 90       MI  . Diabetes Mother   . Heart attack Paternal Uncle    Social History:  reports that he has never smoked. He has never used smokeless tobacco. He reports previous alcohol use. He reports current drug use. Frequency: 3.00 times per week. Drug: Marijuana.  Review of Systems  Genitourinary: Positive for frequency and urgency.  All other systems reviewed and are negative.   Physical Exam:  Vital signs in last 24 hours: Temp:  [97.6 F (36.4 C)] 97.6 F (36.4 C) (11/16 0711) Pulse Rate:  [60] 60 (11/16 0711) Resp:  [25] 25 (11/16 0711) BP: (164)/(83) 164/83 (11/16 0711) SpO2:  [97 %] 97 % (11/16 0711) Weight:  [72.1 kg] 72.1 kg (11/16 0650) Physical Exam  Constitutional: He appears well-developed and well-nourished.  HENT:  Head: Normocephalic and atraumatic.  Eyes: Pupils are equal, round, and reactive to light. EOM are normal.  Neck: Normal range of motion. No thyromegaly present.  Cardiovascular: Normal rate and regular rhythm.  Respiratory: Effort normal. No respiratory distress.  GI: Soft. He exhibits no distension.   Musculoskeletal: Normal range of motion.        General: No edema.  Neurological: He is alert. No cranial nerve deficit.  Skin: Skin is warm and dry.  Psychiatric: He has a normal mood and affect. His behavior is normal. Judgment and thought content normal.    Laboratory Data:  Results for orders placed or performed during the hospital encounter of 05/25/19 (from the past 24 hour(s))  Glucose, capillary     Status: Abnormal   Collection Time: 05/25/19  7:28 AM  Result Value Ref Range   Glucose-Capillary 111 (H) 70 - 99 mg/dL   Recent Results (from the past 240 hour(s))  SARS CORONAVIRUS 2 (TAT 6-24 HRS) Nasopharyngeal Nasopharyngeal Swab     Status: None   Collection Time: 05/22/19  2:55 PM   Specimen: Nasopharyngeal Swab  Result Value Ref Range Status   SARS Coronavirus 2 NEGATIVE NEGATIVE Final    Comment: (NOTE) SARS-CoV-2 target nucleic acids are NOT DETECTED. The SARS-CoV-2 RNA is generally detectable in upper and lower respiratory specimens during the acute phase of infection. Negative results do not preclude SARS-CoV-2 infection, do not rule out co-infections with other pathogens, and should not be used as the sole basis for treatment or other patient management decisions. Negative results must be combined with clinical observations, patient history, and epidemiological information. The expected result is Negative. Fact Sheet for Patients: SugarRoll.be Fact Sheet for Healthcare Providers: https://www.woods-mathews.com/ This test is not yet approved or cleared by the [place] FDA and  has been authorized for detection and/or diagnosis of SARS-CoV-2 by FDA under an Emergency Use Authorization (EUA). This EUA will remain  in effect (meaning this test can be used) for the duration of the COVID-19 declaration under Section 56 4(b)(1) of the Act, 21 U.S.C. section 360bbb-3(b)(1), unless the authorization is terminated or revoked  sooner. Performed at [facility], [place] [address] [place], [place] [address]    Creatinine: Recent Labs    05/22/19 1221  CREATININE 1.08   Baseline Creatinine: 1.08  Impression/Assessment:  78yo with BPH with LUTS, incomplete emptying  Plan:  The risks/benefits/alteranitives to TURP was explained to the patient and he understands and wishes to proceed with surgery  Nicolette Bang 05/25/2019, 7:37 AM

## 2019-05-25 NOTE — Op Note (Signed)
Preoperative diagnosis: BPH with incomplete emptying  Postoperative diagnosis: BPH with incomplete emptying  Procedure: 1 cystoscopy 2. Transurethral resection of the prostate  Attending: Nicolette Bang  Anesthesia: General  Estimated blood loss: Minimal  Drains: 20 French foley  Specimens: 1. Prostate Chips  Antibiotics: Rocephin  Findings: Bilobar prostate enlargement. Ureteral orifices in normal anatomic location.   Indications: Patient is a 78 year old male with a history of BPH and elevated PVR.  After discussing treatment options, they decided proceed with transurethral resection of the prostate.  Procedure her in detail: The patient was brought to the operating room and a brief timeout was done to ensure correct patient, correct procedure, correct site.  General anesthesia was administered patient was placed in dorsal lithotomy position.  Their genitalia was then prepped and draped in usual sterile fashion.  A rigid 43 French cystoscope was passed in the urethra and the bladder.  Bladder was inspected and we noted no masses or lesions.  the ureteral orifices were in the normal orthotopic locations. removed the cystoscope and placed a resectoscope into the bladder. We then turned our attention to the prostate resection. Using the bipolar resectoscope we resected the median lobe first from the bladder neck to the verumontanum. We then started at the 12 oclock position on the left lobe and resection to the 6 o'clock position from the bladder neck to the verumontanum. We then did the same resection of the right lobe. Once the resection was complete we then cauterized individual bleeders. We then removed the prostate chips and sent them for pathology.  We then re-inspected the prostatic fossa and found no residual bleeding.  the bladder was then drained, a 22 French foley was placed and this concluded the procedure which was well tolerated by patient.  Complications: None  Condition:  Stable, extubated, transferred to PACU  Plan: Patient is admitted overnight with continuous bladder irrigation. If their urine is clear tomorrow they will be discharged home and followup on wednesday for foley catheter removal.

## 2019-05-25 NOTE — Anesthesia Procedure Notes (Signed)
Procedure Name: Intubation Date/Time: 05/25/2019 7:58 AM Performed by: Ollen Bowl, CRNA Pre-anesthesia Checklist: Patient identified, Patient being monitored, Timeout performed, Emergency Drugs available and Suction available Patient Re-evaluated:Patient Re-evaluated prior to induction Oxygen Delivery Method: Circle system utilized Preoxygenation: Pre-oxygenation with 100% oxygen Induction Type: IV induction Ventilation: Mask ventilation without difficulty Laryngoscope Size: Mac and 3 Grade View: Grade II Tube type: Oral Tube size: 7.5 mm Number of attempts: 1 Airway Equipment and Method: Stylet Placement Confirmation: ETT inserted through vocal cords under direct vision,  positive ETCO2 and breath sounds checked- equal and bilateral Secured at: 21 cm Tube secured with: Tape Dental Injury: Teeth and Oropharynx as per pre-operative assessment

## 2019-05-25 NOTE — Transfer of Care (Signed)
Immediate Anesthesia Transfer of Care Note  Patient: [name]  Procedure(s) Performed: TRANSURETHRAL RESECTION OF THE PROSTATE (TURP) (N/A )  Patient Location: PACU  Anesthesia Type:General  Level of Consciousness: awake  Airway & Oxygen Therapy: Patient Spontanous Breathing  Post-op Assessment: Report given to RN  Post vital signs: Reviewed  Last Vitals:  Vitals Value Taken Time  BP 130/73 05/25/19 0908  Temp    Pulse 77 05/25/19 0911  Resp 15 05/25/19 0911  SpO2 100 % 05/25/19 0911  Vitals shown include unvalidated device data.  Last Pain:  Vitals:   05/25/19 0711  TempSrc: Oral  PainSc:          Complications: No apparent anesthesia complications

## 2019-05-25 NOTE — Anesthesia Postprocedure Evaluation (Signed)
Anesthesia Post Note  Patient: [name]  Procedure(s) Performed: TRANSURETHRAL RESECTION OF THE PROSTATE (TURP) (N/A )  Patient location during evaluation: PACU Anesthesia Type: General Level of consciousness: awake and alert and oriented Pain management: pain level controlled Vital Signs Assessment: post-procedure vital signs reviewed and stable Respiratory status: spontaneous breathing Cardiovascular status: blood pressure returned to baseline and stable Postop Assessment: no apparent nausea or vomiting Anesthetic complications: no     Last Vitals:  Vitals:   05/25/19 0930 05/25/19 0945  BP: (!) 164/82 (!) 156/81  Pulse: 73 65  Resp: 15 13  Temp:    SpO2: 99% 97%    Last Pain:  Vitals:   05/25/19 0945  TempSrc:   PainSc: Asleep                 Miliani Deike

## 2019-05-25 NOTE — Anesthesia Preprocedure Evaluation (Signed)
Anesthesia Evaluation  Patient identified by MRN, date of birth, ID band Patient awake    Reviewed: Allergy & Precautions, NPO status , Patient's Chart, lab work & pertinent test results  History of Anesthesia Complications Negative for: history of anesthetic complications  Airway Mallampati: III  TM Distance: >3 FB Neck ROM: Full    Dental  (+) Missing, Caps   Pulmonary sleep apnea , pneumonia, resolved,    Pulmonary exam normal breath sounds clear to auscultation       Cardiovascular Exercise Tolerance: Good METS: 3 - Mets hypertension, Pt. on medications Normal cardiovascular exam Rhythm:Regular Rate:Normal  MEET, DAMIAN [id number] 21-May-2019 12:11:00 [facility]300 ROUTINE RECORD Sinus rhythm with occasional Premature ventricular complexes and Fusion complexes Otherwise normal ECG   Neuro/Psych negative neurological ROS  negative psych ROS   GI/Hepatic GERD  Medicated,(+)     substance abuse  marijuana use,   Endo/Other  diabetes, Well Controlled, Type 2, Oral Hypoglycemic Agents  Renal/GU      Musculoskeletal  (+) Arthritis , Osteoarthritis,    Abdominal   Peds  Hematology   Anesthesia Other Findings   Reproductive/Obstetrics                            Anesthesia Physical Anesthesia Plan  ASA: III  Anesthesia Plan: General   Post-op Pain Management:    Induction: Intravenous  PONV Risk Score and Plan: 3 and Midazolam and Ondansetron  Airway Management Planned: Oral ETT  Additional Equipment:   Intra-op Plan:   Post-operative Plan: Extubation in OR  Informed Consent: I have reviewed the patients History and Physical, chart, labs and discussed the procedure including the risks, benefits and alternatives for the proposed anesthesia with the patient or authorized representative who has indicated his/her understanding and acceptance.      Dental advisory given  Plan Discussed with: CRNA  Anesthesia Plan Comments:         Anesthesia Quick Evaluation

## 2019-05-26 ENCOUNTER — Encounter (HOSPITAL_COMMUNITY): Payer: Self-pay | Admitting: Urology

## 2019-05-26 DIAGNOSIS — C61 Malignant neoplasm of prostate: Secondary | ICD-10-CM | POA: Diagnosis not present

## 2019-05-26 LAB — CBC
HCT: 33.4 % — ABNORMAL LOW (ref 39.0–52.0)
Hemoglobin: 11 g/dL — ABNORMAL LOW (ref 13.0–17.0)
MCH: 31.6 pg (ref 26.0–34.0)
MCHC: 32.9 g/dL (ref 30.0–36.0)
MCV: 96 fL (ref 80.0–100.0)
Platelets: 165 10*3/uL (ref 150–400)
RBC: 3.48 MIL/uL — ABNORMAL LOW (ref 4.22–5.81)
RDW: 13 % (ref 11.5–15.5)
WBC: 5.1 10*3/uL (ref 4.0–10.5)
nRBC: 0 % (ref 0.0–0.2)

## 2019-05-26 LAB — GLUCOSE, CAPILLARY
Glucose-Capillary: 102 mg/dL — ABNORMAL HIGH (ref 70–99)
Glucose-Capillary: 227 mg/dL — ABNORMAL HIGH (ref 70–99)

## 2019-05-26 LAB — SURGICAL PATHOLOGY

## 2019-05-26 MED ORDER — CHLORHEXIDINE GLUCONATE CLOTH 2 % EX PADS
6.0000 | MEDICATED_PAD | Freq: Every day | CUTANEOUS | Status: DC
  Administered 2019-05-26: 6 via TOPICAL

## 2019-05-26 NOTE — Progress Notes (Signed)
Patient encouraged to walk during shift.  Patient had refused ambulation at this time.

## 2019-05-26 NOTE — Progress Notes (Signed)
Nsg Discharge Note  Admit Date:  05/25/2019 Discharge date: 05/26/2019   [name] to be D/C'd home per MD order.  AVS completed.  Copy for chart, and copy for patient signed, and dated. Patient/caregiver able to verbalize understanding.  Discharge Medication: Allergies as of 05/26/2019      Reactions   Aspirin Other (See Comments)   Higher dosage upsets stomach   Bayer Aspirin [aspirin] Other (See Comments)   Stomach "flares up"      Medication List    STOP taking these medications   aspirin 81 MG EC tablet   tamsulosin 0.4 MG Caps capsule Commonly known as: FLOMAX     TAKE these medications   acyclovir 200 MG capsule Commonly known as: ZOVIRAX Take 200 mg by mouth as needed.   amLODipine 10 MG tablet Commonly known as: NORVASC Take 10 mg by mouth daily.   ferrous sulfate 324 MG Tbec Take 324 mg by mouth daily with breakfast.   glyBURIDE-metformin 5-500 MG tablet Commonly known as: GLUCOVANCE Take 2 tablets by mouth 2 (two) times daily with a meal.   labetalol 200 MG tablet Commonly known as: NORMODYNE Take 200 mg by mouth 2 (two) times daily.   naproxen 500 MG tablet Commonly known as: NAPROSYN Take 500 mg by mouth 2 (two) times daily with a meal.   omeprazole 40 MG capsule Commonly known as: PRILOSEC Take 40 mg by mouth daily.   ondansetron 4 MG tablet Commonly known as: Zofran Take 1 tablet (4 mg total) by mouth daily as needed for nausea or vomiting.   oxyCODONE 5 MG immediate release tablet Commonly known as: Oxy IR/ROXICODONE Take 5 mg by mouth 3 (three) times daily. For pain   pantoprazole 40 MG tablet Commonly known as: Protonix Take 1 tablet (40 mg total) by mouth daily.   pantoprazole 40 MG tablet Commonly known as: PROTONIX Take 1 tablet (40 mg total) by mouth 2 (two) times daily before a meal.   ramipril 10 MG capsule Commonly known as: ALTACE Take 10 mg by mouth daily.   simvastatin 20 MG tablet Commonly known as:  ZOCOR Take 20 mg by mouth daily.   Tyler Aas FlexTouch 200 UNIT/ML Sopn Generic drug: Insulin Degludec Inject 12 Units into the skin at bedtime.       Discharge Assessment: Vitals:   05/26/19 0502 05/26/19 0900  BP: 133/65 139/75  Pulse: 63 70  Resp: 20 18  Temp: 98.3 F (36.8 C) 98.4 F (36.9 C)  SpO2: 100% 100%   Skin clean, dry and intact without evidence of skin break down, no evidence of skin tears noted. IV catheter discontinued intact. Site without signs and symptoms of complications - no redness or edema noted at insertion site, patient denies c/o pain - only slight tenderness at site.  Dressing with slight pressure applied.  D/c Instructions-Education: Discharge instructions given to patient/family with verbalized understanding. D/c education completed with patient/family including follow up instructions, medication list, d/c activities limitations if indicated, with other d/c instructions as indicated by MD - patient able to verbalize understanding, all questions fully answered. Patient instructed to return to ED, call 911, or call MD for any changes in condition.  Patient escorted via [place], and D/C home via private auto.  Venita Sheffield, RN 05/26/2019 12:39 PM

## 2019-05-26 NOTE — Plan of Care (Signed)

## 2019-05-27 ENCOUNTER — Ambulatory Visit (INDEPENDENT_AMBULATORY_CARE_PROVIDER_SITE_OTHER): Payer: Medicare HMO | Admitting: Urology

## 2019-05-27 DIAGNOSIS — N401 Enlarged prostate with lower urinary tract symptoms: Secondary | ICD-10-CM | POA: Diagnosis not present

## 2019-06-02 ENCOUNTER — Ambulatory Visit (INDEPENDENT_AMBULATORY_CARE_PROVIDER_SITE_OTHER): Payer: Medicare HMO | Admitting: Urology

## 2019-06-02 DIAGNOSIS — N401 Enlarged prostate with lower urinary tract symptoms: Secondary | ICD-10-CM | POA: Diagnosis not present

## 2019-06-16 ENCOUNTER — Ambulatory Visit (INDEPENDENT_AMBULATORY_CARE_PROVIDER_SITE_OTHER): Payer: Medicare HMO | Admitting: Urology

## 2019-06-16 ENCOUNTER — Other Ambulatory Visit (HOSPITAL_COMMUNITY)
Admission: RE | Admit: 2019-06-16 | Discharge: 2019-06-16 | Disposition: A | Discharge status: Home / Self Care | Payer: Medicare HMO | Source: Ambulatory Visit | Attending: Urology | Admitting: Urology

## 2019-06-16 DIAGNOSIS — N401 Enlarged prostate with lower urinary tract symptoms: Secondary | ICD-10-CM

## 2019-06-16 DIAGNOSIS — N3000 Acute cystitis without hematuria: Secondary | ICD-10-CM | POA: Insufficient documentation

## 2019-06-17 ENCOUNTER — Other Ambulatory Visit: Payer: Self-pay

## 2019-06-17 ENCOUNTER — Ambulatory Visit (INDEPENDENT_AMBULATORY_CARE_PROVIDER_SITE_OTHER): Payer: Medicare HMO | Admitting: Nurse Practitioner

## 2019-06-17 ENCOUNTER — Encounter (HOSPITAL_COMMUNITY): Payer: Self-pay | Admitting: *Deleted

## 2019-06-17 ENCOUNTER — Emergency Department (HOSPITAL_COMMUNITY)
Admission: EM | Admit: 2019-06-17 | Discharge: 2019-06-17 | Disposition: A | Discharge status: Home / Self Care | Payer: Medicare HMO | Attending: Emergency Medicine | Admitting: Emergency Medicine

## 2019-06-17 DIAGNOSIS — Z794 Long term (current) use of insulin: Secondary | ICD-10-CM | POA: Diagnosis not present

## 2019-06-17 DIAGNOSIS — E119 Type 2 diabetes mellitus without complications: Secondary | ICD-10-CM | POA: Diagnosis not present

## 2019-06-17 DIAGNOSIS — R338 Other retention of urine: Secondary | ICD-10-CM

## 2019-06-17 DIAGNOSIS — I10 Essential (primary) hypertension: Secondary | ICD-10-CM | POA: Diagnosis not present

## 2019-06-17 DIAGNOSIS — N401 Enlarged prostate with lower urinary tract symptoms: Secondary | ICD-10-CM | POA: Insufficient documentation

## 2019-06-17 DIAGNOSIS — Z79899 Other long term (current) drug therapy: Secondary | ICD-10-CM | POA: Diagnosis not present

## 2019-06-17 DIAGNOSIS — N9989 Other postprocedural complications and disorders of genitourinary system: Secondary | ICD-10-CM | POA: Insufficient documentation

## 2019-06-17 DIAGNOSIS — R339 Retention of urine, unspecified: Secondary | ICD-10-CM | POA: Insufficient documentation

## 2019-06-17 LAB — URINALYSIS, ROUTINE W REFLEX MICROSCOPIC
Bilirubin Urine: NEGATIVE
Glucose, UA: NEGATIVE mg/dL
Ketones, ur: 5 mg/dL — AB
Nitrite: NEGATIVE
Protein, ur: 100 mg/dL — AB
RBC / HPF: >50 RBC/hpf — ABNORMAL HIGH (ref 0–5)
Specific Gravity, Urine: 1.011 (ref 1.005–1.030)
WBC, UA: >50 WBC/hpf — ABNORMAL HIGH (ref 0–5)
pH: 7 (ref 5.0–8.0)

## 2019-06-17 LAB — URINE CULTURE: Culture: NO GROWTH

## 2019-06-17 NOTE — Discharge Instructions (Addendum)
You were seen in the Emergency Department today due to urinary retention. Your bladder was distended and was relieved when we placed a catheter. Please follow up with your Urologist in 3 days as the catheter will need to be removed to reduce risk of developing a UTI. If your symptoms return or you develop fever, chills, fatigue, confusion, or have blood or drainage from the penis please seek emergency care.  I also recommend you follow up with your PCP as well.

## 2019-06-17 NOTE — Discharge Summary (Signed)
Patient ID: [name] MRN: [id number] DOB/AGE: 11-Jan-1941 78 y.o.  Admit date: 05/25/2019 Discharge date: 06/17/2019  Primary Care Physician:  Lucia Gaskins, MD  Discharge Diagnoses:  BPH with obstruction Present on Admission: . BPH (benign prostatic hyperplasia)      Discharge Medications: Allergies as of 05/26/2019      Reactions   Aspirin Other (See Comments)   Higher dosage upsets stomach   Bayer Aspirin [aspirin] Other (See Comments)   Stomach "flares up"      Medication List    STOP taking these medications   aspirin 81 MG EC tablet   tamsulosin 0.4 MG Caps capsule Commonly known as: FLOMAX     TAKE these medications   acyclovir 200 MG capsule Commonly known as: ZOVIRAX Take 200 mg by mouth as needed.   amLODipine 10 MG tablet Commonly known as: NORVASC Take 10 mg by mouth daily.   ferrous sulfate 324 MG Tbec Take 324 mg by mouth daily with breakfast.   glyBURIDE-metformin 5-500 MG tablet Commonly known as: GLUCOVANCE Take 2 tablets by mouth 2 (two) times daily with a meal.   labetalol 200 MG tablet Commonly known as: NORMODYNE Take 200 mg by mouth 2 (two) times daily.   naproxen 500 MG tablet Commonly known as: NAPROSYN Take 500 mg by mouth 2 (two) times daily with a meal.   omeprazole 40 MG capsule Commonly known as: PRILOSEC Take 40 mg by mouth daily.   ondansetron 4 MG tablet Commonly known as: Zofran Take 1 tablet (4 mg total) by mouth daily as needed for nausea or vomiting.   oxyCODONE 5 MG immediate release tablet Commonly known as: Oxy IR/ROXICODONE Take 5 mg by mouth 3 (three) times daily. For pain   pantoprazole 40 MG tablet Commonly known as: Protonix Take 1 tablet (40 mg total) by mouth daily.   pantoprazole 40 MG tablet Commonly known as: PROTONIX Take 1 tablet (40 mg total) by mouth 2 (two) times daily before a meal.   ramipril 10 MG capsule Commonly known as: ALTACE Take 10 mg by mouth daily.    simvastatin 20 MG tablet Commonly known as: ZOCOR Take 20 mg by mouth daily.   Tyler Aas FlexTouch 200 UNIT/ML Sopn Generic drug: Insulin Degludec Inject 12 Units into the skin at bedtime.        Significant Diagnostic Studies:  No results found.  Brief H and P: For complete details please refer to admission H and P, but in brief  The patient is admitted for TURP for management of BPH with obstruction  Hospital Course:  Patient underwent TURP.  He was sent to the floor postoperatively for bladder irrigation.  He had no significant issues postoperatively and was discharged home on postoperative day 1. Active Problems:   BPH (benign prostatic hyperplasia)   Day of Discharge BP 139/75 (BP Location: Left Arm)   Pulse 70   Temp 98.4 F (36.9 C) (Oral)   Resp 18   Ht 5\' 10"  (1.778 m)   Wt 72.1 kg   SpO2 100%   BMI 22.81 kg/m   No results found for this or any previous visit (from the past 24 hour(s)).  Physical Exam: General: Alert and awake oriented x3 not in any acute distress. HEENT: anicteric sclera, pupils reactive to light and accommodation CVS: S1-S2 clear no murmur rubs or gallops Chest: clear to auscultation bilaterally, no wheezing rales or rhonchi Abdomen: soft nontender, nondistended, normal bowel sounds, no organomegaly Extremities: no cyanosis, clubbing or edema  noted bilaterally Neuro: Cranial nerves II-XII intact, no focal neurological deficits  Disposition:    Home  Diet:   No restrictions  Activity:   Restrictions discussed with patient   Disposition and Follow-up:      Pathology review   DISCHARGE FOLLOW-UP  Follow-up Information    Cleon Gustin, MD On 05/27/2019.   Specialty: Urology Why: at 9:30 am Contact information: [place] [address] [phone]            Signed: Lillette Boxer Chrisotpher Rivero 06/17/2019, 5:33 PM

## 2019-06-17 NOTE — ED Provider Notes (Signed)
[facility] EMERGENCY DEPARTMENT Provider Note   CSN: [id number] Arrival date & time: 06/17/19  0827     History   Chief Complaint Chief Complaint  Patient presents with  . Urinary Retention    HPI [name] is a 78 y.o. male.     HPI  Patient is a 78y/o male who is about 3 weeks out from TURP due to BPH with severe LUTS. He has PMH of arthritis, T2DM, chronic back pain, HTN, HLD, and GERD. He was seen in the Urology clinic yesterday due to inability to void and an in-and-out cath was performed that relieved discomfort. A urinanalysis was performed that is still pending. Since that time his pain and discomfort in lower abdomen and in his penis has returned and he has not urinated since. He denies any pus or drainage from his penis but did have mild bleeding after a catheter was placed in urology office.  Past Medical History:  Diagnosis Date  . Arthritis    fingers, back  . Chronic back pain   . Diabetes mellitus Since 1995   Takes Glucovance and Onglyza daily  . Diabetes mellitus without complication ([place])   . GERD (gastroesophageal reflux disease)    takes Dexilant and Omeprazole daily  . Herpes   . History of colon polyps   . History of gastric ulcer 25+yrs ago  . Hyperlipidemia Since 1995   takes Simvastatin daily  . Hypertension Since 1995   takes Metoprolol and Ramipril daily  . Hypertension   . Pneumonia    hx of;as a child  . Sleep apnea    doesn't use CPAP  . Urinary frequency   . Urinary urgency    takes Flomax daily    Patient Active Problem List   Diagnosis Date Noted  . BPH (benign prostatic hyperplasia) 05/25/2019  . Dysphagia 11/24/2018  . History of colonic polyps 04/24/2018  . 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 GERD (gastroesophageal reflux disease) 09/11/2011  . High cholesterol 09/11/2011  . Bronchitis 09/11/2011  . DM 02/13/2010  . ESSENTIAL HYPERTENSION, BENIGN 02/13/2010  . PRECORDIAL PAIN 02/13/2010    Past Surgical  History:  Procedure Laterality Date  . BACK SURGERY  1962   Lumbar  . BACK SURGERY    . bilateral cataract surgery    . CARDIAC CATHETERIZATION  02/16/10  . CERVICAL SPINE SURGERY    . COLONOSCOPY    . COLONOSCOPY N/A 04/23/2013   Procedure: COLONOSCOPY;  Surgeon: Rogene Houston, MD;  [facility];  Service: Endoscopy;  Laterality: N/A;  1030  . COLONOSCOPY N/A 07/24/2018   Procedure: COLONOSCOPY;  Surgeon: Rogene Houston, MD;  Location[facility];  Service: Endoscopy;  Laterality: N/A;  8:30  . ESOPHAGOGASTRODUODENOSCOPY N/A 12/24/2014   Procedure: ESOPHAGOGASTRODUODENOSCOPY (EGD);  Surgeon: Rogene Houston, MD;  Location: [facility];  Service: Endoscopy;  Laterality: N/A;  2:45  . ESOPHAGOGASTRODUODENOSCOPY (EGD) WITH ESOPHAGEAL DILATION    . HAND SURGERY     Right  . LUMBAR LAMINECTOMY/DECOMPRESSION MICRODISCECTOMY N/A 08/29/2012   Procedure: LUMBAR LAMINECTOMY/DECOMPRESSION MICRODISCECTOMY 1 LEVEL;  Surgeon: Floyce Stakes, MD;  Location: [place];  Service: Neurosurgery;  Laterality: N/A;  Lumbar four-five laminectomies  . NECK SURGERY    . PENILE PROSTHESIS IMPLANT    . ROTATOR CUFF REPAIR     Right  . SHOULDER SURGERY    . TRANSURETHRAL RESECTION OF PROSTATE N/A 05/25/2019   Procedure: TRANSURETHRAL RESECTION OF THE PROSTATE (TURP);  Surgeon: Cleon Gustin, MD;  Location: [facility];  Service: Urology;  Laterality: N/A;  . WRIST SURGERY     Left  . WRIST SURGERY        Home Medications    Prior to Admission medications   Medication Sig Start Date End Date Taking? Authorizing Provider  acyclovir (ZOVIRAX) 200 MG capsule Take 200 mg by mouth as needed.  01/07/14   [provider]  amLODipine (NORVASC) 10 MG tablet Take 10 mg by mouth daily.    [provider]  ferrous sulfate 324 MG TBEC Take 324 mg by mouth daily with breakfast.    [provider]  glyBURIDE-metformin (GLUCOVANCE) 5-500 MG per tablet Take 2 tablets by  mouth 2 (two) times daily with a meal.     [provider]  labetalol (NORMODYNE) 200 MG tablet Take 200 mg by mouth 2 (two) times daily.    [provider]  naproxen (NAPROSYN) 500 MG tablet Take 500 mg by mouth 2 (two) times daily with a meal.    [provider]  omeprazole (PRILOSEC) 40 MG capsule Take 40 mg by mouth daily.    [provider]  ondansetron (ZOFRAN) 4 MG tablet Take 1 tablet (4 mg total) by mouth daily as needed for nausea or vomiting. 01/05/19 01/05/20  Setzer, Rona Ravens, NP  oxyCODONE (OXY IR/ROXICODONE) 5 MG immediate release tablet Take 5 mg by mouth 3 (three) times daily. For pain 06/16/13   [provider]  pantoprazole (PROTONIX) 40 MG tablet Take 1 tablet (40 mg total) by mouth daily. 09/17/18 09/17/19  Setzer, Rona Ravens, NP  pantoprazole (PROTONIX) 40 MG tablet Take 1 tablet (40 mg total) by mouth 2 (two) times daily before a meal. 11/24/18   Setzer, Terri L, NP  ramipril (ALTACE) 10 MG capsule Take 10 mg by mouth daily.  02/05/14   [provider]  simvastatin (ZOCOR) 20 MG tablet Take 20 mg by mouth daily. 04/15/13   [provider]  TRESIBA FLEXTOUCH 200 UNIT/ML SOPN Inject 12 Units into the skin at bedtime.  05/07/18   [provider]  bismuth-metronidazole-tetracycline (PLYERA) 140-125-125 MG per capsule Take 3 capsules by mouth 4 (four) times daily -  before meals and at bedtime.  09/26/11  [provider]    Family History Family History  Problem Relation Age of Onset  . Heart attack Father 63       MI  . Hypertension Father   . Heart attack Brother 24       MI  . Diabetes Mother   . Heart attack Paternal Uncle     Social History Social History   Tobacco Use  . Smoking status: Never Smoker  . Smokeless tobacco: Never Used  Substance Use Topics  . Alcohol use: Not Currently    Comment: occasionally beer but not since 11/2018  . Drug use: Yes    Frequency: 3.0 times per week     Types: Marijuana     Allergies   Aspirin and Bayer aspirin [aspirin]   Review of Systems Review of Systems  Constitutional: Negative for activity change, appetite change, fatigue and fever.  HENT: Negative for congestion, rhinorrhea, sinus pressure and sore throat.   Respiratory: Negative for cough, chest tightness and shortness of breath.   Cardiovascular: Negative for chest pain.  Gastrointestinal: Negative for abdominal pain, blood in stool, constipation, diarrhea and nausea.  Genitourinary: Positive for difficulty urinating and penile pain. Negative for discharge, dysuria, flank  pain, genital sores, hematuria, penile swelling, testicular pain and urgency.  Skin: Negative for rash.    Physical Exam Updated Vital Signs BP (!) 149/90 (BP Location: Right Arm)   Pulse 90   Temp 98.1 F (36.7 C) (Oral)   Resp (!) 28   Ht 5\' 10"  (1.778 m)   Wt 72.1 kg   SpO2 100%   BMI 22.81 kg/m   Physical Exam Constitutional:      General: He is not in acute distress.    Appearance: Normal appearance. He is normal weight. He is not ill-appearing.  HENT:     Head: Normocephalic and atraumatic.  Eyes:     Extraocular Movements: Extraocular movements intact.     Conjunctiva/sclera: Conjunctivae normal.     Pupils: Pupils are equal, round, and reactive to light.  Cardiovascular:     Rate and Rhythm: Normal rate and regular rhythm.     Pulses: Normal pulses.     Heart sounds: Normal heart sounds. No murmur.  Pulmonary:     Effort: Pulmonary effort is normal.     Breath sounds: Normal breath sounds. No wheezing, rhonchi or rales.  Chest:     Chest wall: No tenderness.  Abdominal:     General: Abdomen is flat. Bowel sounds are normal. There is no distension.     Palpations: Abdomen is soft.     Tenderness: There is no abdominal tenderness. There is no guarding or rebound.  Genitourinary:    Penis: Normal.      Scrotum/Testes: Normal.     Comments: suprapubic tenderness Skin:     General: Skin is warm and dry.     Capillary Refill: Capillary refill takes less than 2 seconds.     Findings: No erythema or rash.  Neurological:     Mental Status: He is alert and oriented to person, place, and time.     ED Treatments / Results  Labs (all labs ordered are listed, but only abnormal results are displayed) Labs Reviewed  URINE CULTURE  URINALYSIS, ROUTINE W REFLEX MICROSCOPIC    EKG None  Radiology No results found.  Procedures Procedures: None (including critical care time)  Medications Ordered in ED Medications - No data to display   Initial Impression / Assessment and Plan / ED Course  I have reviewed the triage vital signs and the nursing notes.  Pertinent labs & imaging results that were available during my care of the patient were reviewed by me and considered in my medical decision making (see chart for details).  Patient is a 78y/o male with acute urinary retention secondary to TURP. Subrapubic pain was relieved with placement of catheter with 580cc of urine output. Recent BMP from 11/16 shows normal kidney function. Patient was placed on antibiotics by Urology and instructed to continue taking as prescribed. No concerning signs for UTI or Pyelonephritis.  Final Clinical Impressions(s) / ED Diagnoses   Final diagnoses:  Urinary retention  Acute urinary retention    ED Discharge Orders    None       Nuala Alpha, DO 06/17/19 [id number]    Elnora Morrison, MD 06/17/19 1544

## 2019-06-17 NOTE — Progress Notes (Deleted)
   Subjective:    Patient ID: MACARTHUR NIZIOLEK, male    DOB: March 11, 1941, 78 y.o.   MRN: [id number]  HPI Paton Agosto is a 78 year old male with a past medical history of arthritis, DM II, sleep apnea, GERD, gastric ulcer and colon polyps.   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 No other abnormality seen in the esophagus.  EGD 12/24/2014: Small sliding hiatal hernia without evidence of erosive esophagitis ring or stricture formation. Antral gastritis with small whitish plaque. Biopsy taken. Comment;Since no bleeding lesion identified suspect he bled from Mallory-Weiss tear which has healed by now.  Colonoscopy 07/24/2018: - The entire examined colon is normal. - Internal hemorrhoids. - No specimens collected  Abdominal/pelvic CT 10/22/2017: 1. No acute abnormality or cause of abdominal pain identified. 2. 13 mm hyperenhancing liver lesion, unchanged from 2012 and therefore likely benign. 3.  Aortic Atherosclerosis  CBC Latest Ref Rng & Units 05/26/2019 05/25/2019 05/22/2019  WBC 4.0 - 10.5 K/uL 5.1 5.1 3.3(L)  Hemoglobin 13.0 - 17.0 g/dL 11.0(L) 11.1(L) 11.2(L)  Hematocrit 39.0 - 52.0 % 33.4(L) 32.4(L) 33.4(L)  Platelets 150 - 400 K/uL 165 166 191    Past Medical History:  Diagnosis Date  . Arthritis    fingers, back  . Chronic back pain   . Diabetes mellitus Since 1995   Takes Glucovance and Onglyza daily  . Diabetes mellitus without complication ([place])   . GERD (gastroesophageal reflux disease)    takes Dexilant and Omeprazole daily  . Herpes   . History of colon polyps   . History of gastric ulcer 25+yrs ago  . Hyperlipidemia Since 1995   takes Simvastatin daily  . Hypertension Since 1995   takes Metoprolol and Ramipril daily  . Hypertension   . Pneumonia    hx of;as a child  . Sleep apnea    doesn't use CPAP  . Urinary frequency   . Urinary  urgency    takes Flomax daily       Review of Systems     Objective:   Physical Exam        Assessment & Plan:

## 2019-06-17 NOTE — ED Triage Notes (Signed)
Pt comes in from home c/o unable to void since 1000 yesterday morning. Pt had prostate surgery on Nov. 16 and had follow up appt yesterday and was unable to void at that time so they did an in and out cath and drained his bladder and sent for urinalysis per pt. Pt was told to follow up with urologist on 12/23 but he has been unable to void since he left the doctor yesterday.

## 2019-06-18 ENCOUNTER — Other Ambulatory Visit: Payer: Self-pay

## 2019-06-18 ENCOUNTER — Encounter (HOSPITAL_COMMUNITY): Payer: Self-pay | Admitting: Emergency Medicine

## 2019-06-18 ENCOUNTER — Emergency Department (HOSPITAL_COMMUNITY)
Admission: EM | Admit: 2019-06-18 | Discharge: 2019-06-18 | Disposition: A | Discharge status: Home / Self Care | Payer: Medicare HMO | Attending: Emergency Medicine | Admitting: Emergency Medicine

## 2019-06-18 DIAGNOSIS — Y828 Other medical devices associated with adverse incidents: Secondary | ICD-10-CM | POA: Diagnosis not present

## 2019-06-18 DIAGNOSIS — R339 Retention of urine, unspecified: Secondary | ICD-10-CM | POA: Insufficient documentation

## 2019-06-18 DIAGNOSIS — E119 Type 2 diabetes mellitus without complications: Secondary | ICD-10-CM | POA: Insufficient documentation

## 2019-06-18 DIAGNOSIS — I10 Essential (primary) hypertension: Secondary | ICD-10-CM | POA: Diagnosis not present

## 2019-06-18 DIAGNOSIS — T83031A Leakage of indwelling urethral catheter, initial encounter: Secondary | ICD-10-CM | POA: Diagnosis present

## 2019-06-18 DIAGNOSIS — Z79899 Other long term (current) drug therapy: Secondary | ICD-10-CM | POA: Diagnosis not present

## 2019-06-18 LAB — URINE CULTURE: Culture: NO GROWTH

## 2019-06-18 NOTE — Discharge Instructions (Signed)
Follow-up with your urologist in the office

## 2019-06-18 NOTE — ED Provider Notes (Signed)
[facility] EMERGENCY DEPARTMENT Provider Note   CSN: [id number] Arrival date & time: 06/18/19  0430     History Chief Complaint  Patient presents with  . Catheter problems    [name] is a 78 y.o. male.  Patient was seen in the ER yesterday and had a Foley catheter placed for urinary retention.  Overnight he has noticed leakage of urine around the Foley tube without any urine collecting in the bag.  He does not have any significant discomfort or pain.  No bleeding.  He is unaware of any trauma, does not think that he pulled the tube at any time.        Past Medical History:  Diagnosis Date  . Arthritis    fingers, back  . Chronic back pain   . Diabetes mellitus Since 1995   Takes Glucovance and Onglyza daily  . Diabetes mellitus without complication ([place])   . GERD (gastroesophageal reflux disease)    takes Dexilant and Omeprazole daily  . Herpes   . History of colon polyps   . History of gastric ulcer 25+yrs ago  . Hyperlipidemia Since 1995   takes Simvastatin daily  . Hypertension Since 1995   takes Metoprolol and Ramipril daily  . Hypertension   . Pneumonia    hx of;as a child  . Sleep apnea    doesn't use CPAP  . Urinary frequency   . Urinary urgency    takes Flomax daily    Patient Active Problem List   Diagnosis Date Noted  . BPH (benign prostatic hyperplasia) 05/25/2019  . Dysphagia 11/24/2018  . History of colonic polyps 04/24/2018  . 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 GERD (gastroesophageal reflux disease) 09/11/2011  . High cholesterol 09/11/2011  . Bronchitis 09/11/2011  . DM 02/13/2010  . ESSENTIAL HYPERTENSION, BENIGN 02/13/2010  . PRECORDIAL PAIN 02/13/2010    Past Surgical History:  Procedure Laterality Date  . BACK SURGERY  1962   Lumbar  . BACK SURGERY    . bilateral cataract surgery    . CARDIAC CATHETERIZATION  02/16/10  . CERVICAL SPINE SURGERY    . COLONOSCOPY    . COLONOSCOPY N/A 04/23/2013   Procedure:  COLONOSCOPY;  Surgeon: Rogene Houston, MD;  [facility];  Service: Endoscopy;  Laterality: N/A;  1030  . COLONOSCOPY N/A 07/24/2018   Procedure: COLONOSCOPY;  Surgeon: Rogene Houston, MD;  [facility];  Service: Endoscopy;  Laterality: N/A;  8:30  . ESOPHAGOGASTRODUODENOSCOPY N/A 12/24/2014   Procedure: ESOPHAGOGASTRODUODENOSCOPY (EGD);  Surgeon: Rogene Houston, MD;  [facility];  Service: Endoscopy;  Laterality: N/A;  2:45  . ESOPHAGOGASTRODUODENOSCOPY (EGD) WITH ESOPHAGEAL DILATION    . HAND SURGERY     Right  . LUMBAR LAMINECTOMY/DECOMPRESSION MICRODISCECTOMY N/A 08/29/2012   Procedure: LUMBAR LAMINECTOMY/DECOMPRESSION MICRODISCECTOMY 1 LEVEL;  Surgeon: Floyce Stakes, MD;  Location: [place] [facility];  Service: Neurosurgery;  Laterality: N/A;  Lumbar four-five laminectomies  . NECK SURGERY    . PENILE PROSTHESIS IMPLANT    . ROTATOR CUFF REPAIR     Right  . SHOULDER SURGERY    . TRANSURETHRAL RESECTION OF PROSTATE N/A 05/25/2019   Procedure: TRANSURETHRAL RESECTION OF THE PROSTATE (TURP);  Surgeon: Cleon Gustin, MD;  Location: [facility];  Service: Urology;  Laterality: N/A;  . WRIST SURGERY     Left  . WRIST SURGERY         Family History  Problem Relation Age of Onset  .  Heart attack Father 48       MI  . Hypertension Father   . Heart attack Brother 96       MI  . Diabetes Mother   . Heart attack Paternal Uncle     Social History   Tobacco Use  . Smoking status: Never Smoker  . Smokeless tobacco: Never Used  Substance Use Topics  . Alcohol use: Not Currently    Comment: occasionally beer but not since 11/2018  . Drug use: Yes    Frequency: 3.0 times per week    Types: Marijuana    Home Medications Prior to Admission medications   Medication Sig Start Date End Date Taking? Authorizing Provider  acyclovir (ZOVIRAX) 200 MG capsule Take 200 mg by mouth as needed.  01/07/14   [provider]  amLODipine (NORVASC) 10 MG  tablet Take 10 mg by mouth daily.    [provider]  ferrous sulfate 324 MG TBEC Take 324 mg by mouth daily with breakfast.    [provider]  glyBURIDE-metformin (GLUCOVANCE) 5-500 MG per tablet Take 2 tablets by mouth 2 (two) times daily with a meal.     [provider]  labetalol (NORMODYNE) 200 MG tablet Take 200 mg by mouth 2 (two) times daily.    [provider]  naproxen (NAPROSYN) 500 MG tablet Take 500 mg by mouth 2 (two) times daily with a meal.    [provider]  omeprazole (PRILOSEC) 40 MG capsule Take 40 mg by mouth daily.    [provider]  ondansetron (ZOFRAN) 4 MG tablet Take 1 tablet (4 mg total) by mouth daily as needed for nausea or vomiting. 01/05/19 01/05/20  Setzer, Rona Ravens, NP  oxyCODONE (OXY IR/ROXICODONE) 5 MG immediate release tablet Take 5 mg by mouth 3 (three) times daily. For pain 06/16/13   [provider]  pantoprazole (PROTONIX) 40 MG tablet Take 1 tablet (40 mg total) by mouth daily. 09/17/18 09/17/19  Setzer, Rona Ravens, NP  pantoprazole (PROTONIX) 40 MG tablet Take 1 tablet (40 mg total) by mouth 2 (two) times daily before a meal. 11/24/18   Setzer, Terri L, NP  ramipril (ALTACE) 10 MG capsule Take 10 mg by mouth daily.  02/05/14   [provider]  simvastatin (ZOCOR) 20 MG tablet Take 20 mg by mouth daily. 04/15/13   [provider]  TRESIBA FLEXTOUCH 200 UNIT/ML SOPN Inject 12 Units into the skin at bedtime.  05/07/18   [provider]  bismuth-metronidazole-tetracycline (PLYERA) 140-125-125 MG per capsule Take 3 capsules by mouth 4 (four) times daily -  before meals and at bedtime.  09/26/11  [provider]    Allergies    Aspirin and Bayer aspirin [aspirin]  Review of Systems   Review of Systems  Genitourinary: Positive for decreased urine volume.  All other systems reviewed and are negative.   Physical Exam Updated Vital Signs BP 123/68 (BP Location: Left Arm)    Pulse 77   Temp 97.7 F (36.5 C) (Oral)   Resp 18   Ht 5\' 10"  (1.778 m)   Wt 72.1 kg   SpO2 99%   BMI 22.81 kg/m   Physical Exam Vitals and nursing note reviewed.  Constitutional:      General: He is not in acute distress.    Appearance: Normal appearance. He is well-developed.  HENT:     Head: Normocephalic and atraumatic.     Right Ear: Hearing normal.  Left Ear: Hearing normal.     Nose: Nose normal.  Eyes:     Conjunctiva/sclera: Conjunctivae normal.     Pupils: Pupils are equal, round, and reactive to light.  Cardiovascular:     Rate and Rhythm: Regular rhythm.     Heart sounds: S1 normal and S2 normal. No murmur. No friction rub. No gallop.   Pulmonary:     Effort: Pulmonary effort is normal. No respiratory distress.     Breath sounds: Normal breath sounds.  Chest:     Chest wall: No tenderness.  Abdominal:     General: Bowel sounds are normal.     Palpations: Abdomen is soft.     Tenderness: There is no abdominal tenderness. There is no guarding or rebound. Negative signs include Murphy's sign and McBurney's sign.     Hernia: No hernia is present.  Musculoskeletal:        General: Normal range of motion.     Cervical back: Normal range of motion and neck supple.  Skin:    General: Skin is warm and dry.     Findings: No rash.  Neurological:     Mental Status: He is alert and oriented to person, place, and time.     GCS: GCS eye subscore is 4. GCS verbal subscore is 5. GCS motor subscore is 6.     Cranial Nerves: No cranial nerve deficit.     Sensory: No sensory deficit.     Coordination: Coordination normal.  Psychiatric:        Speech: Speech normal.        Behavior: Behavior normal.        Thought Content: Thought content normal.     ED Results / Procedures / Treatments   Labs (all labs ordered are listed, but only abnormal results are displayed) Labs Reviewed - No data to display  EKG None  Radiology No results found.  Procedures  Procedures (including critical care time)  Medications Ordered in ED Medications - No data to display  ED Course  I have reviewed the triage vital signs and the nursing notes.  Pertinent labs & imaging results that were available during my care of the patient were reviewed by me and considered in my medical decision making (see chart for details).    MDM Rules/Calculators/A&P     CHA2DS2/VAS Stroke Risk Points      N/A >= 2 Points: High Risk  1 - 1.99 Points: Medium Risk  0 Points: Low Risk    A final score could not be computed because of missing components.: Last  Change: N/A     This score determines the patient's risk of having a stroke if the  patient has atrial fibrillation.      This score is not applicable to this patient. Components are not  calculated.                   Patient presents to the emergency department for evaluation of problems with his Foley catheter.  The catheter was placed yesterday for urinary retention.  He has noticed leakage of urine around the Foley catheter without any urine collecting in the bag.  Foley catheter replaced.  Follow-up with urology.  Final Clinical Impression(s) / ED Diagnoses Final diagnoses:  Urinary retention    Rx / DC Orders ED Discharge Orders    None       Pollina, Gwenyth Allegra, MD 06/18/19 [phone]

## 2019-06-18 NOTE — ED Triage Notes (Signed)
Pt seen here yesterday and had catheter placed and states that he is "peeing all over himself". Denies any other issues.

## 2019-06-19 ENCOUNTER — Encounter (HOSPITAL_COMMUNITY): Payer: Self-pay

## 2019-06-19 ENCOUNTER — Other Ambulatory Visit: Payer: Self-pay

## 2019-06-19 ENCOUNTER — Emergency Department (HOSPITAL_COMMUNITY)
Admission: EM | Admit: 2019-06-19 | Discharge: 2019-06-19 | Disposition: A | Discharge status: Home / Self Care | Payer: Medicare HMO | Attending: Emergency Medicine | Admitting: Emergency Medicine

## 2019-06-19 DIAGNOSIS — T839XXA Unspecified complication of genitourinary prosthetic device, implant and graft, initial encounter: Secondary | ICD-10-CM

## 2019-06-19 DIAGNOSIS — T83031A Leakage of indwelling urethral catheter, initial encounter: Secondary | ICD-10-CM | POA: Diagnosis present

## 2019-06-19 DIAGNOSIS — E119 Type 2 diabetes mellitus without complications: Secondary | ICD-10-CM | POA: Insufficient documentation

## 2019-06-19 DIAGNOSIS — R339 Retention of urine, unspecified: Secondary | ICD-10-CM | POA: Insufficient documentation

## 2019-06-19 DIAGNOSIS — Z794 Long term (current) use of insulin: Secondary | ICD-10-CM | POA: Diagnosis not present

## 2019-06-19 DIAGNOSIS — N401 Enlarged prostate with lower urinary tract symptoms: Secondary | ICD-10-CM | POA: Insufficient documentation

## 2019-06-19 DIAGNOSIS — I10 Essential (primary) hypertension: Secondary | ICD-10-CM | POA: Insufficient documentation

## 2019-06-19 DIAGNOSIS — Z79899 Other long term (current) drug therapy: Secondary | ICD-10-CM | POA: Diagnosis not present

## 2019-06-19 DIAGNOSIS — N4889 Other specified disorders of penis: Secondary | ICD-10-CM | POA: Diagnosis not present

## 2019-06-19 DIAGNOSIS — Y828 Other medical devices associated with adverse incidents: Secondary | ICD-10-CM | POA: Insufficient documentation

## 2019-06-19 LAB — URINALYSIS, ROUTINE W REFLEX MICROSCOPIC
Bilirubin Urine: NEGATIVE
Glucose, UA: NEGATIVE mg/dL
Ketones, ur: NEGATIVE mg/dL
Nitrite: NEGATIVE
Protein, ur: 100 mg/dL — AB
RBC / HPF: >50 RBC/hpf — ABNORMAL HIGH (ref 0–5)
Specific Gravity, Urine: 1.014 (ref 1.005–1.030)
WBC, UA: >50 WBC/hpf — ABNORMAL HIGH (ref 0–5)
pH: 5 (ref 5.0–8.0)

## 2019-06-19 MED ORDER — LIDOCAINE HCL URETHRAL/MUCOSAL 2 % EX GEL
1.0000 "application " | Freq: Once | CUTANEOUS | Status: AC
  Administered 2019-06-19: 1 via URETHRAL
  Filled 2019-06-19: qty 10

## 2019-06-19 NOTE — ED Provider Notes (Signed)
[facility] EMERGENCY DEPARTMENT Provider Note   CSN: [id number] Arrival date & time: 06/19/19  [id number]     History Chief Complaint  Patient presents with  . cathether leaking    [name] is a 78 y.o. male.  HPI   Patient presents after being seen several hours ago following acute urinary retention, now with concern for penis pain, leakage around his Foley catheter. Patient was initially seen at his urologist's office yesterday, had in and out catheter, but soon thereafter again had a difficulty voiding. Subsequently he was in the emergency department, had Foley catheter placed, reportedly without complication. He notes that not long after arriving home he developed leakage around the catheter site, pain at the meatus, with decreased urine going into the catheter bag.  The pain itself is sore, severe. No other new complaints, though the patient is fixated on his pain, leakage, recalcitrant in terms of other historical details.  Past Medical History:  Diagnosis Date  . Arthritis    fingers, back  . Chronic back pain   . Diabetes mellitus Since 1995   Takes Glucovance and Onglyza daily  . Diabetes mellitus without complication ([place])   . GERD (gastroesophageal reflux disease)    takes Dexilant and Omeprazole daily  . Herpes   . History of colon polyps   . History of gastric ulcer 25+yrs ago  . Hyperlipidemia Since 1995   takes Simvastatin daily  . Hypertension Since 1995   takes Metoprolol and Ramipril daily  . Hypertension   . Pneumonia    hx of;as a child  . Sleep apnea    doesn't use CPAP  . Urinary frequency   . Urinary urgency    takes Flomax daily    Patient Active Problem List   Diagnosis Date Noted  . BPH (benign prostatic hyperplasia) 05/25/2019  . Dysphagia 11/24/2018  . History of colonic polyps 04/24/2018  . 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 GERD (gastroesophageal reflux disease) 09/11/2011  . High cholesterol 09/11/2011  . Bronchitis  09/11/2011  . DM 02/13/2010  . ESSENTIAL HYPERTENSION, BENIGN 02/13/2010  . PRECORDIAL PAIN 02/13/2010    Past Surgical History:  Procedure Laterality Date  . BACK SURGERY  1962   Lumbar  . BACK SURGERY    . bilateral cataract surgery    . CARDIAC CATHETERIZATION  02/16/10  . CERVICAL SPINE SURGERY    . COLONOSCOPY    . COLONOSCOPY N/A 04/23/2013   Procedure: COLONOSCOPY;  Surgeon: Rogene Houston, MD;  [facility];  Service: Endoscopy;  Laterality: N/A;  1030  . COLONOSCOPY N/A 07/24/2018   Procedure: COLONOSCOPY;  Surgeon: Rogene Houston, MD;  [facility];  Service: Endoscopy;  Laterality: N/A;  8:30  . ESOPHAGOGASTRODUODENOSCOPY N/A 12/24/2014   Procedure: ESOPHAGOGASTRODUODENOSCOPY (EGD);  Surgeon: Rogene Houston, MD;  [facility];  Service: Endoscopy;  Laterality: N/A;  2:45  . ESOPHAGOGASTRODUODENOSCOPY (EGD) WITH ESOPHAGEAL DILATION    . HAND SURGERY     Right  . LUMBAR LAMINECTOMY/DECOMPRESSION MICRODISCECTOMY N/A 08/29/2012   Procedure: LUMBAR LAMINECTOMY/DECOMPRESSION MICRODISCECTOMY 1 LEVEL;  Surgeon: Floyce Stakes, MD;  Location: [place] NEURO ORS;  Service: Neurosurgery;  Laterality: N/A;  Lumbar four-five laminectomies  . NECK SURGERY    . PENILE PROSTHESIS IMPLANT    . ROTATOR CUFF REPAIR     Right  . SHOULDER SURGERY    . TRANSURETHRAL RESECTION OF PROSTATE N/A 05/25/2019   Procedure: TRANSURETHRAL RESECTION OF THE PROSTATE (TURP);  Surgeon: Cleon Gustin, MD;  Location: [facility];  Service: Urology;  Laterality: N/A;  . WRIST SURGERY     Left  . WRIST SURGERY         Family History  Problem Relation Age of Onset  . Heart attack Father 46       MI  . Hypertension Father   . Heart attack Brother 24       MI  . Diabetes Mother   . Heart attack Paternal Uncle     Social History   Tobacco Use  . Smoking status: Never Smoker  . Smokeless tobacco: Never Used  Substance Use Topics  . Alcohol use: Not Currently     Comment: occasionally beer but not since 11/2018  . Drug use: Yes    Frequency: 3.0 times per week    Types: Marijuana    Home Medications Prior to Admission medications   Medication Sig Start Date End Date Taking? Authorizing Provider  acyclovir (ZOVIRAX) 200 MG capsule Take 200 mg by mouth as needed.  01/07/14   [provider]  amLODipine (NORVASC) 10 MG tablet Take 10 mg by mouth daily.    [provider]  ferrous sulfate 324 MG TBEC Take 324 mg by mouth daily with breakfast.    [provider]  glyBURIDE-metformin (GLUCOVANCE) 5-500 MG per tablet Take 2 tablets by mouth 2 (two) times daily with a meal.     [provider]  labetalol (NORMODYNE) 200 MG tablet Take 200 mg by mouth 2 (two) times daily.    [provider]  naproxen (NAPROSYN) 500 MG tablet Take 500 mg by mouth 2 (two) times daily with a meal.    [provider]  omeprazole (PRILOSEC) 40 MG capsule Take 40 mg by mouth daily.    [provider]  ondansetron (ZOFRAN) 4 MG tablet Take 1 tablet (4 mg total) by mouth daily as needed for nausea or vomiting. 01/05/19 01/05/20  Setzer, Rona Ravens, NP  oxyCODONE (OXY IR/ROXICODONE) 5 MG immediate release tablet Take 5 mg by mouth 3 (three) times daily. For pain 06/16/13   [provider]  pantoprazole (PROTONIX) 40 MG tablet Take 1 tablet (40 mg total) by mouth daily. 09/17/18 09/17/19  Setzer, Rona Ravens, NP  pantoprazole (PROTONIX) 40 MG tablet Take 1 tablet (40 mg total) by mouth 2 (two) times daily before a meal. 11/24/18   Setzer, Terri L, NP  ramipril (ALTACE) 10 MG capsule Take 10 mg by mouth daily.  02/05/14   [provider]  simvastatin (ZOCOR) 20 MG tablet Take 20 mg by mouth daily. 04/15/13   [provider]  TRESIBA FLEXTOUCH 200 UNIT/ML SOPN Inject 12 Units into the skin at bedtime.  05/07/18   [provider]  bismuth-metronidazole-tetracycline (PLYERA) 140-125-125 MG per capsule Take 3  capsules by mouth 4 (four) times daily -  before meals and at bedtime.  09/26/11  [provider]    Allergies    Aspirin and Bayer aspirin [aspirin]  Review of Systems   Review of Systems  Constitutional:       Per HPI, otherwise negative  HENT:       Per HPI, otherwise negative  Respiratory:       Per HPI, otherwise negative  Cardiovascular:       Per HPI, otherwise negative  Gastrointestinal: Negative for vomiting.  Endocrine:       Negative aside from HPI  Genitourinary:  Neg aside from HPI   Musculoskeletal:       Per HPI, otherwise negative  Skin: Negative.   Neurological: Negative for syncope.    Physical Exam Updated Vital Signs BP 128/66 (BP Location: Right Arm)   Pulse 80   Temp 97.8 F (36.6 C) (Oral)   Resp 18   SpO2 99%   Physical Exam Vitals and nursing note reviewed.  Constitutional:      General: He is not in acute distress.    Appearance: He is well-developed.     Comments: Uncomfortable appearing adult male awake and alert  HENT:     Head: Normocephalic and atraumatic.  Eyes:     Conjunctiva/sclera: Conjunctivae normal.  Pulmonary:     Effort: Pulmonary effort is normal. No respiratory distress.     Breath sounds: No stridor.  Abdominal:     General: There is no distension.  Genitourinary:   Skin:    General: Skin is warm and dry.  Neurological:     Mental Status: He is alert and oriented to person, place, and time.     ED Results / Procedures / Treatments   Labs (all labs ordered are listed, but only abnormal results are displayed) Labs Reviewed  URINALYSIS, ROUTINE W REFLEX MICROSCOPIC    EKG None  Radiology No results found.  Procedures Procedures (including critical care time)  Medications Ordered in ED Medications  lidocaine (XYLOCAINE) 2 % jelly 1 application (has no administration in time range)    ED Course  I have reviewed the triage vital signs and the nursing notes.  Pertinent labs & imaging  results that were available during my care of the patient were reviewed by me and considered in my medical decision making (see chart for details).    MDM Rules/Calculators/A&P     CHA2DS2/VAS Stroke Risk Points      N/A >= 2 Points: High Risk  1 - 1.99 Points: Medium Risk  0 Points: Low Risk    A final score could not be computed because of missing components.: Last  Change: N/A     This score determines the patient's risk of having a stroke if the  patient has atrial fibrillation.      This score is not applicable to this patient. Components are not  calculated.                   Chart review notable for Foley catheter placed within the past 12 hours, after the patient initially went to his physician's office.   9:47 AM Patient in no distress, awake, alert. He has had repositioning with catheter with inflation of his balloon, irrigation,Bladder scan, which demonstrated only about 100 mL of fluid. He is now having no leakage around the catheter itself, pain has resolved, he has no other complaints.  UA c/w recent procedure.  Final Clinical Impression(s) / ED Diagnoses Final diagnoses:  Urinary catheter complication, initial encounter [facility])    Rx / [place] Orders ED Discharge Orders    None       Carmin Muskrat, MD 06/19/19 [phone]

## 2019-06-19 NOTE — ED Triage Notes (Signed)
Pt reports that cath was changed yesterday and continues to leak around penis. Reports pain around penis. Prostate sx nov 16

## 2019-06-19 NOTE — Discharge Instructions (Signed)
As discussed, your evaluation today has been largely reassuring.  But, it is important that you monitor your condition carefully, and do not hesitate to return to the ED if you develop new, or concerning changes in your condition. ? ?Otherwise, please follow-up with your physician for appropriate ongoing care. ? ?

## 2019-06-23 ENCOUNTER — Ambulatory Visit (INDEPENDENT_AMBULATORY_CARE_PROVIDER_SITE_OTHER): Payer: Medicare HMO | Admitting: Urology

## 2019-06-23 ENCOUNTER — Encounter: Payer: Self-pay | Admitting: Urology

## 2019-06-23 VITALS — BP 110/75 | HR 80 | Temp 97.9°F

## 2019-06-23 DIAGNOSIS — N401 Enlarged prostate with lower urinary tract symptoms: Secondary | ICD-10-CM

## 2019-06-23 DIAGNOSIS — R338 Other retention of urine: Secondary | ICD-10-CM | POA: Diagnosis not present

## 2019-06-23 LAB — BLADDER SCAN: Scan Result: 38.3

## 2019-06-23 NOTE — Progress Notes (Signed)
Continuous Intermittent Catheterization  Due to incomplete bladder empting patient is present today for a teaching of self I & O Catheterization. Patient was given detailed verbal and printed instructions of self catheterization. Patient was cleaned and prepped in a sterile fashion.  With instruction and assistance patient inserted a 16 coudeFR and urine return was noted 38ml ml, urine was yellow in color. Patient tolerated well, no complications were noted Patient was given a sample bag with supplies to take home.  Instructions were given per Dr. Diona Fanti for patient to cath as needed. Patient is to follow up in 1 week.  Preformed by: Kiely Cousar LPN      Uroflow  Peak Flow: 26ml Average Flow: 66ml Voided Volume: 223ml Voiding Time: 23sec Flow Time: 22sec Time to Peak Flow: 4sec  PVR Volume: 38.29ml

## 2019-06-23 NOTE — Progress Notes (Signed)
   Subjective:    Patient ID: [name], male    DOB: 03-31-1941, 78 y.o.   MRN: [id number]  BPH w/ Acute Urinary Retention  12.15.20: Here today having gone into retention this last weekend for which he presented to the ER today. He reports that since Friday, no urine had drained into his leg bag and has instead been leaking around his cath. He also reports having had intermittent bladder pain since onset of these sx's.   Cysto today normal with no signs of obstruction. He fully voided all instilled fluid with adequate flow.        Review of Systems  Gastrointestinal: Positive for abdominal pain.  Genitourinary: Positive for decreased urine volume and difficulty urinating.       Objective:   Physical Exam Constitutional:      Appearance: Normal appearance.  HENT:     Head: Normocephalic and atraumatic.     Right Ear: External ear normal.     Left Ear: External ear normal.     Nose: Nose normal.     Mouth/Throat:     Mouth: Mucous membranes are moist.  Eyes:     Extraocular Movements: Extraocular movements intact.     Conjunctiva/sclera: Conjunctivae normal.     Pupils: Pupils are equal, round, and reactive to light.  Pulmonary:     Effort: Pulmonary effort is normal.  Abdominal:     General: Abdomen is flat.  Genitourinary:    Penis: Normal and circumcised.      Testes: Normal.     Comments: Indwelling cath was present -- removed prior to exam.  Musculoskeletal:        General: Normal range of motion.     Cervical back: Normal range of motion.  Skin:    General: Skin is warm and dry.  Neurological:     General: No focal deficit present.     Mental Status: He is alert and oriented to person, place, and time.  Psychiatric:        Mood and Affect: Mood normal.     Comments: Slight difficulties reporting his medical hx            Assessment & Plan:

## 2019-06-23 NOTE — Progress Notes (Signed)
Cystoscopy Procedure Note:  Indication: Difficulty voiding  After informed consent and discussion of the procedure and its risks, [name] was positioned and prepped in the standard fashion. Cystoscopy was performed with a flexible cystoscope. The urethra, bladder neck and entire bladder was visualized in a standard fashion. The prostate fossa was well-resected. Mild inflammatory tissue present. No obstruction. The ureteral orifices were visualized in their normal location and orientation. No urothelial lesions were noted.  Imaging:  Findings: No obstruction. No prosthesis erosion.  Assessment and Plan: He will learn sic  S.M. Diona Fanti, MD

## 2019-06-23 NOTE — Patient Instructions (Signed)
[id number] 

## 2019-07-01 ENCOUNTER — Encounter: Payer: Self-pay | Admitting: Urology

## 2019-07-01 ENCOUNTER — Ambulatory Visit (INDEPENDENT_AMBULATORY_CARE_PROVIDER_SITE_OTHER): Payer: Medicare HMO | Admitting: Urology

## 2019-07-01 VITALS — BP 135/75 | HR 76 | Temp 98.1°F | Ht 70.0 in | Wt 158.0 lb

## 2019-07-01 DIAGNOSIS — N401 Enlarged prostate with lower urinary tract symptoms: Secondary | ICD-10-CM | POA: Diagnosis not present

## 2019-07-01 DIAGNOSIS — R338 Other retention of urine: Secondary | ICD-10-CM | POA: Diagnosis not present

## 2019-07-01 LAB — BLADDER SCAN AMB NON-IMAGING

## 2019-07-01 NOTE — Progress Notes (Signed)
07/01/2019 11:00 AM   [name] Aug 31, 1940 [id number]  Referring provider: Lucia Gaskins, MD [place],  [place] [address]  Urinary retention  HPI: [name] is a 78yo with a hx of BPH s/p TURP on 11/16. Stream strong. He had 2 episodes of retention post op requiring foley. He underwent cystoscopy last week which showed an open prostatic fossa. No issues currently PVR 28.5cc   PMH: Past Medical History:  Diagnosis Date  . Arthritis    fingers, back  . Chronic back pain   . Diabetes mellitus Since 1995   Takes Glucovance and Onglyza daily  . Diabetes mellitus without complication ([place])   . GERD (gastroesophageal reflux disease)    takes Dexilant and Omeprazole daily  . Herpes   . History of colon polyps   . History of gastric ulcer 25+yrs ago  . Hyperlipidemia Since 1995   takes Simvastatin daily  . Hypertension Since 1995   takes Metoprolol and Ramipril daily  . Hypertension   . Pneumonia    hx of;as a child  . Sleep apnea    doesn't use CPAP  . Urinary frequency   . Urinary urgency    takes Flomax daily    Surgical History: Past Surgical History:  Procedure Laterality Date  . BACK SURGERY  1962   Lumbar  . BACK SURGERY    . bilateral cataract surgery    . CARDIAC CATHETERIZATION  02/16/10  . CERVICAL SPINE SURGERY    . COLONOSCOPY    . COLONOSCOPY N/A 04/23/2013   Procedure: COLONOSCOPY;  Surgeon: Rogene Houston, MD;  [facility];  Service: Endoscopy;  Laterality: N/A;  1030  . COLONOSCOPY N/A 07/24/2018   Procedure: COLONOSCOPY;  Surgeon: Rogene Houston, MD;  [facility];  Service: Endoscopy;  Laterality: N/A;  8:30  . ESOPHAGOGASTRODUODENOSCOPY N/A 12/24/2014   Procedure: ESOPHAGOGASTRODUODENOSCOPY (EGD);  Surgeon: Rogene Houston, MD;  [facility];  Service: Endoscopy;  Laterality: N/A;  2:45  . ESOPHAGOGASTRODUODENOSCOPY (EGD) WITH ESOPHAGEAL DILATION    . HAND SURGERY     Right  .  LUMBAR LAMINECTOMY/DECOMPRESSION MICRODISCECTOMY N/A 08/29/2012   Procedure: LUMBAR LAMINECTOMY/DECOMPRESSION MICRODISCECTOMY 1 LEVEL;  Surgeon: Floyce Stakes, MD;  Location: [place] NEURO ORS;  Service: Neurosurgery;  Laterality: N/A;  Lumbar four-five laminectomies  . NECK SURGERY    . PENILE PROSTHESIS IMPLANT    . ROTATOR CUFF REPAIR     Right  . SHOULDER SURGERY    . TRANSURETHRAL RESECTION OF PROSTATE N/A 05/25/2019   Procedure: TRANSURETHRAL RESECTION OF THE PROSTATE (TURP);  Surgeon: Cleon Gustin, MD;  Location: [facility];  Service: Urology;  Laterality: N/A;  . WRIST SURGERY     Left  . WRIST SURGERY      Home Medications:  Allergies as of 07/01/2019      Reactions   Aspirin Other (See Comments)   Higher dosage upsets stomach   Bayer Aspirin [aspirin] Other (See Comments)   Stomach "flares up"      Medication List       Accurate as of July 01, 2019 11:00 AM. If you have any questions, ask your nurse or doctor.        STOP taking these medications   cephALEXin 500 MG capsule Commonly known as: KEFLEX Stopped by: Nicolette Bang, MD     TAKE these medications   acyclovir 200 MG capsule Commonly known as: ZOVIRAX Take 200 mg by mouth as  needed.   amLODipine 10 MG tablet Commonly known as: NORVASC Take 10 mg by mouth daily.   glyBURIDE-metformin 5-500 MG tablet Commonly known as: GLUCOVANCE Take 2 tablets by mouth 2 (two) times daily with a meal.   labetalol 200 MG tablet Commonly known as: NORMODYNE Take 200 mg by mouth 2 (two) times daily.   naproxen 500 MG tablet Commonly known as: NAPROSYN Take 500 mg by mouth 2 (two) times daily with a meal.   omeprazole 40 MG capsule Commonly known as: PRILOSEC Take 40 mg by mouth daily.   ondansetron 4 MG tablet Commonly known as: Zofran Take 1 tablet (4 mg total) by mouth daily as needed for nausea or vomiting.   oxyCODONE 5 MG immediate release tablet Commonly known as: Oxy IR/ROXICODONE Take 5 mg  by mouth 3 (three) times daily. For pain   pantoprazole 40 MG tablet Commonly known as: Protonix Take 1 tablet (40 mg total) by mouth daily.   pantoprazole 40 MG tablet Commonly known as: PROTONIX Take 1 tablet (40 mg total) by mouth 2 (two) times daily before a meal.   ramipril 10 MG capsule Commonly known as: ALTACE Take 10 mg by mouth daily.   simvastatin 20 MG tablet Commonly known as: ZOCOR Take 20 mg by mouth daily.       Allergies:  Allergies  Allergen Reactions  . Aspirin Other (See Comments)    Higher dosage upsets stomach  . Bayer Aspirin [Aspirin] Other (See Comments)    Stomach "flares up"    Family History: Family History  Problem Relation Age of Onset  . Heart attack Father 79       MI  . Hypertension Father   . Heart attack Brother 63       MI  . Diabetes Mother   . Heart attack Paternal Uncle     Social History:  reports that he has never smoked. He has never used smokeless tobacco. He reports previous alcohol use. He reports current drug use. Frequency: 3.00 times per week. Drug: Marijuana.  ROS:                                        Physical Exam: BP 135/75   Pulse 76   Temp 98.1 F (36.7 C)   Ht 5\' 10"  (1.778 m)   Wt 158 lb (71.7 kg)   BMI 22.67 kg/m   Constitutional:  Alert and oriented, No acute distress. HEENT: [place] AT, moist mucus membranes.  Trachea midline, no masses. Cardiovascular: No clubbing, cyanosis, or edema. Respiratory: Normal respiratory effort, no increased work of breathing. GI: Abdomen is soft, nontender, nondistended, no abdominal masses GU: No CVA tenderness Lymph: No cervical or inguinal lymphadenopathy. Skin: No rashes, bruises or suspicious lesions. Neurologic: Grossly intact, no focal deficits, moving all 4 extremities. Psychiatric: Normal mood and affect.  Laboratory Data: Lab Results  Component Value Date   WBC 5.1 05/26/2019   HGB 11.0 (L) 05/26/2019   HCT 33.4 (L) 05/26/2019     MCV 96.0 05/26/2019   PLT 165 05/26/2019    Lab Results  Component Value Date   CREATININE 0.76 05/25/2019    No results found for: PSA  No results found for: TESTOSTERONE  Lab Results  Component Value Date   HGBA1C 7.2 (H) 05/22/2019    Urinalysis    Component Value Date/Time   COLORURINE YELLOW 06/19/2019 0827  APPEARANCEUR HAZY (A) 06/19/2019 0827   LABSPEC 1.014 06/19/2019 0827   PHURINE 5.0 06/19/2019 0827   GLUCOSEU NEGATIVE 06/19/2019 0827   HGBUR LARGE (A) 06/19/2019 0827   BILIRUBINUR NEGATIVE 06/19/2019 0827   KETONESUR NEGATIVE 06/19/2019 0827   PROTEINUR 100 (A) 06/19/2019 0827   UROBILINOGEN 0.2 05/15/2014 0122   NITRITE NEGATIVE 06/19/2019 0827   LEUKOCYTESUR LARGE (A) 06/19/2019 0827    Lab Results  Component Value Date   BACTERIA RARE (A) 06/19/2019    Pertinent Imaging: none No results found for this or any previous visit. No results found for this or any previous visit. No results found for this or any previous visit. No results found for this or any previous visit. No results found for this or any previous visit. No results found for this or any previous visit. No results found for this or any previous visit. Results for orders placed during the hospital encounter of 09/20/17  CT Renal Stone Study   Narrative CLINICAL DATA:  No new urinary tract stone. Low back pain for 2 weeks. Urinary retention and dysuria.  EXAM: CT ABDOMEN AND PELVIS WITHOUT CONTRAST  TECHNIQUE: Multidetector CT imaging of the abdomen and pelvis was performed following the standard protocol without IV contrast.  COMPARISON:  CT of the abdomen and pelvis 01/25/2012. Lumbar spine radiographs 04/24/2017.  FINDINGS: Lower chest: Excellent recall bronchiectasis is again noted at the lung bases bilaterally without superimposed airspace disease. The heart size is normal. No significant pleural or pericardial effusion is present.  Hepatobiliary: No focal liver  abnormality is seen. No gallstones, gallbladder wall thickening, or biliary dilatation.  Pancreas: Unremarkable. No pancreatic ductal dilatation or surrounding inflammatory changes.  Spleen: Calcification along the lateral margin of the spleen is stable. No focal lesions are evident.  Adrenals/Urinary Tract: Adrenal glands are normal bilaterally. No stone or mass lesion is present. There is no obstruction. The ureters are within normal limits bilaterally. The urinary bladder is within normal limits.  Stomach/Bowel: The stomach and duodenum are within normal limits. Small bowel is unremarkable. The terminal ileum is within normal limits. The appendix is visualized and normal.  Vascular/Lymphatic: Atherosclerotic calcifications are present without aneurysm.  Reproductive: A penile prosthesis is noted. The prostate gland is somewhat prominent and indents the inferior margin of the urinary bladder. It measures 4 cm in transverse diameter.  Other: No abdominal wall hernia or abnormality. No abdominopelvic ascites.  Musculoskeletal: Progressive endplate degenerative changes and sclerosis are present at L3-4, L4-5, and L5-S1. A transitional S1 segment is again noted. Foraminal narrowing is evident bilaterally at L4-5 and L5-S1.  IMPRESSION: 1. No urinary tract obstruction or stone. 2. Prominent prostate gland indents the inferior border of the urinary bladder. 3. progressive degenerative changes in the lower lumbar spine. 4. Penile prosthesis.   Electronically Signed   By: San Morelle [name]   On: 09/20/2017 16:28     Assessment & Plan:    1. Benign prostatic hyperplasia with urinary retention -RTC 3 months with pvr - pvr   Return in about 3 months (around 09/29/2019) for pvr.  Nicolette Bang, MD  [facility] [place]

## 2019-07-01 NOTE — Patient Instructions (Signed)
Transurethral Resection of the Prostate, Care After °This sheet gives you information about how to care for yourself after your procedure. Your health care provider may also give you more specific instructions. If you have problems or questions, contact your health care provider. °What can I expect after the procedure? °After the procedure, it is common to have: °· Mild pain in your lower abdomen. °· Soreness or mild discomfort in your penis from having the catheter inserted during the procedure. °· A feeling of urgency when you need to urinate. °· A small amount of blood in your urine. You may notice some small blood clots in your urine. These are normal. °Follow these instructions at home: °Medicines °· Take over-the-counter and prescription medicines only as told by your health care provider. °· If you were prescribed an antibiotic medicine, take it as told by your health care provider. Do not stop taking the antibiotic even if you start to feel better. °· Ask your health care provider if the medicine prescribed to you: °? Requires you to avoid driving or using heavy machinery. °? Can cause constipation. You may need to take actions to prevent or treat constipation, such as: °§ Take over-the-counter or prescription medicines. °§ Eat foods that are high in fiber, such as fresh fruits and vegetables, whole grains, and beans. °§ Limit foods that are high in fat and processed sugars, such as fried or sweet foods. °· Do not drive for 24 hours if you were given a sedative during your procedure. °Activity ° °· Return to your normal activities as told by your health care provider. Ask your health care provider what activities are safe for you. °· Do not lift anything that is heavier than 10 lb (4.5 kg), or the limit that you are told, for 3 weeks after the procedure or until your health care provider says that it is safe. °· Avoid intense physical activity for as long as told by your health care provider. °· Avoid  sitting for a long time without moving. Get up and move around one or more times every few hours. This helps to prevent blood clots. You may increase your physical activity gradually as you start to feel better. °Lifestyle °· Do not drink alcohol for as long as told by your health care provider. This is especially important if you are taking prescription pain medicines. °· Do not engage in sexual activity until your health care provider says that you can do this. °General instructions ° °· Do not take baths, swim, or use a hot tub until your health care provider approves. °· Drink enough fluid to keep your urine pale yellow. °· Urinate as soon as you feel the need to. Do not try to hold your urine for long periods of time. °· If your health care provider approves, you may take a stool softener for 2-3 weeks to prevent you from straining to have a bowel movement. °· Wear compression stockings as told by your health care provider. These stockings help to prevent blood clots and reduce swelling in your legs. °· Keep all follow-up visits as told by your health care provider. This is important. °Contact a health care provider if you have: °· Difficulty urinating. °· A fever. °· Pain that gets worse or does not improve with medicine. °· Blood in your urine that does not go away after 1 week of resting and drinking more fluids. °· Swelling in your penis or testicles. °Get help right away if: °· You are unable   to urinate. °· You are having more blood clots in your urine instead of fewer. °· You have: °? Large blood clots. °? A lot of blood in your urine. °? Pain in your back or lower abdomen. °? Pain or swelling in your legs. °? Chills and you are shaking. °? Difficulty breathing or shortness of breath. °Summary °· After the procedure, it is common to have a small amount of blood in your urine. °· Avoid heavy lifting and intense physical activity for as long as told by your health care provider. °· Urinate as soon as you  feel the need to. Do not try to hold your urine for long periods of time. °· Keep all follow-up visits as told by your health care provider. This is important. °This information is not intended to replace advice given to you by your health care provider. Make sure you discuss any questions you have with your health care provider. °Document Released: 06/25/2005 Document Revised: 10/15/2018 Document Reviewed: 03/26/2018 °Elsevier Patient Education © 2020 [employer] ° °

## 2019-07-01 NOTE — Progress Notes (Signed)
Pt. Unable to void in office.

## 2019-07-08 ENCOUNTER — Ambulatory Visit: Payer: Medicare HMO | Admitting: Urology

## 2019-07-14 ENCOUNTER — Encounter: Payer: Self-pay | Admitting: Urology

## 2019-07-30 ENCOUNTER — Ambulatory Visit (INDEPENDENT_AMBULATORY_CARE_PROVIDER_SITE_OTHER): Payer: Medicare HMO | Admitting: Gastroenterology

## 2019-07-30 ENCOUNTER — Other Ambulatory Visit: Payer: Self-pay

## 2019-07-30 ENCOUNTER — Encounter (INDEPENDENT_AMBULATORY_CARE_PROVIDER_SITE_OTHER): Payer: Self-pay | Admitting: Gastroenterology

## 2019-07-30 VITALS — BP 206/100 | HR 56 | Temp 97.1°F | Ht 70.0 in | Wt 158.5 lb

## 2019-07-30 DIAGNOSIS — K5909 Other constipation: Secondary | ICD-10-CM | POA: Diagnosis not present

## 2019-07-30 DIAGNOSIS — R1310 Dysphagia, unspecified: Secondary | ICD-10-CM | POA: Diagnosis not present

## 2019-07-30 DIAGNOSIS — K219 Gastro-esophageal reflux disease without esophagitis: Secondary | ICD-10-CM

## 2019-07-30 DIAGNOSIS — R1319 Other dysphagia: Secondary | ICD-10-CM

## 2019-07-30 MED ORDER — POLYETHYLENE GLYCOL 3350 17 GM/SCOOP PO POWD: 17.0000 g | Freq: Every day | ORAL | 1 refills | Status: DC

## 2019-07-30 NOTE — Progress Notes (Signed)
Patient profile: [name] is a 79 y.o. male seen for follow up. Last seen in clinic June 2020.   History of Present Illness: [name] is seen today for 21-month follow-up.  He was last seen in the summer and had a barium swallow as below.  He reports overall his dysphagia is doing fairly well.  He had one episode of feeling like a slice of orange got stuck but otherwise denies dysphagia to foods.  He does avoid steak due to hx of dysphagia, if eats steak and will lodge in upper esophageal area.  He mainly notes dysphagia to his Metformin tablet.  States he swallows liquids fine.  He takes omeprazole with good control of GERD symptoms.  He denies any nausea vomiting.  Feels his appetite is good.    He had a prostate surgery 05/2019 (TURP) and since that time has been using narcotics about every other day, has noted worsening constipation since that time.  He is currently having a bowel movement with incomplete emptying and hard small stool every 3 to 5 days.  He has tried Metamucil and stool softener without improvement.  He denies any rectal bleeding.  He gets some sharp pain in his lower abdomen at times that will only last a few seconds, unsure if this is worse on the days he is constipated or if improves with defecation.  Wt Readings from Last 3 Encounters:  07/30/19 158 lb 8 oz (71.9 kg)  07/01/19 158 lb (71.7 kg)  06/18/19 158 lb 15.2 oz (72.1 kg)   Weight in 11/2018-#162   Last Colonoscopy:  January 2020-done for history of colon polyps-entire colon normal.  Internal hemorrhoids   Last Endoscopy: 2016-Impression: Small sliding hiatal hernia without evidence of erosive esophagitis ring or stricture formation. Antral gastritis with small whitish plaque. Biopsy taken. Pathology with chronic inactive gastritis, negative H. pylori   Past Medical History:  Past Medical History:  Diagnosis Date  . Arthritis    fingers, back  . Chronic back pain   . Diabetes mellitus  Since 1995   Takes Glucovance and Onglyza daily  . Diabetes mellitus without complication ([place])   . GERD (gastroesophageal reflux disease)    takes Dexilant and Omeprazole daily  . Herpes   . History of colon polyps   . History of gastric ulcer 25+yrs ago  . Hyperlipidemia Since 1995   takes Simvastatin daily  . Hypertension Since 1995   takes Metoprolol and Ramipril daily  . Hypertension   . Pneumonia    hx of;as a child  . Sleep apnea    doesn't use CPAP  . Urinary frequency   . Urinary urgency    takes Flomax daily    Problem List: Patient Active Problem List   Diagnosis Date Noted  . BPH (benign prostatic hyperplasia) 05/25/2019  . Dysphagia 11/24/2018  . History of colonic polyps 04/24/2018  . 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 GERD (gastroesophageal reflux disease) 09/11/2011  . High cholesterol 09/11/2011  . Bronchitis 09/11/2011  . DM 02/13/2010  . ESSENTIAL HYPERTENSION, BENIGN 02/13/2010  . PRECORDIAL PAIN 02/13/2010    Past Surgical History: Past Surgical History:  Procedure Laterality Date  . BACK SURGERY  1962   Lumbar  . BACK SURGERY    . bilateral cataract surgery    . CARDIAC CATHETERIZATION  02/16/10  . CERVICAL SPINE SURGERY    . COLONOSCOPY    . COLONOSCOPY N/A 04/23/2013   Procedure: COLONOSCOPY;  Surgeon: Mechele Dawley  Laural Golden, MD;  Location: [facility];  Service: Endoscopy;  Laterality: N/A;  1030  . COLONOSCOPY N/A 07/24/2018   Procedure: COLONOSCOPY;  Surgeon: Rogene Houston, MD;  Location: [facility];  Service: Endoscopy;  Laterality: N/A;  8:30  . ESOPHAGOGASTRODUODENOSCOPY N/A 12/24/2014   Procedure: ESOPHAGOGASTRODUODENOSCOPY (EGD);  Surgeon: Rogene Houston, MD;  Location: [facility];  Service: Endoscopy;  Laterality: N/A;  2:45  . ESOPHAGOGASTRODUODENOSCOPY (EGD) WITH ESOPHAGEAL DILATION    . HAND SURGERY     Right  . LUMBAR LAMINECTOMY/DECOMPRESSION MICRODISCECTOMY N/A 08/29/2012   Procedure: LUMBAR LAMINECTOMY/DECOMPRESSION  MICRODISCECTOMY 1 LEVEL;  Surgeon: Floyce Stakes, MD;  Location: [facility];  Service: Neurosurgery;  Laterality: N/A;  Lumbar four-five laminectomies  . NECK SURGERY    . PENILE PROSTHESIS IMPLANT    . ROTATOR CUFF REPAIR     Right  . SHOULDER SURGERY    . TRANSURETHRAL RESECTION OF PROSTATE N/A 05/25/2019   Procedure: TRANSURETHRAL RESECTION OF THE PROSTATE (TURP);  Surgeon: Cleon Gustin, MD;  Location: [facility];  Service: Urology;  Laterality: N/A;  . WRIST SURGERY     Left  . WRIST SURGERY      Allergies: Allergies  Allergen Reactions  . Aspirin Other (See Comments)    Higher dosage upsets stomach  . Bayer Aspirin [Aspirin] Other (See Comments)    Stomach "flares up"      Home Medications:  Current Outpatient Medications:  .  glyBURIDE-metformin (GLUCOVANCE) 5-500 MG per tablet, Take 2 tablets by mouth 2 (two) times daily with a meal. , Disp: , Rfl:  .  labetalol (NORMODYNE) 200 MG tablet, Take 200 mg by mouth 2 (two) times daily., Disp: , Rfl:  .  metoprolol succinate (TOPROL-XL) 50 MG 24 hr tablet, Take 50 mg by mouth daily. Take with or immediately following a meal., Disp: , Rfl:  .  naproxen (NAPROSYN) 500 MG tablet, Take 500 mg by mouth 2 (two) times daily with a meal., Disp: , Rfl:  .  omeprazole (PRILOSEC) 40 MG capsule, Take 40 mg by mouth daily., Disp: , Rfl:  .  ondansetron (ZOFRAN) 4 MG tablet, Take 1 tablet (4 mg total) by mouth daily as needed for nausea or vomiting., Disp: 30 tablet, Rfl: 1 .  oxyCODONE (OXY IR/ROXICODONE) 5 MG immediate release tablet, Take 5 mg by mouth 3 (three) times daily. For pain, Disp: , Rfl:  .  ramipril (ALTACE) 10 MG capsule, Take 10 mg by mouth daily. , Disp: , Rfl:  .  simvastatin (ZOCOR) 20 MG tablet, Take 20 mg by mouth daily., Disp: , Rfl:  .  tamsulosin (FLOMAX) 0.4 MG CAPS capsule, Take 0.4 mg by mouth 2 (two) times daily., Disp: , Rfl:  .  acyclovir (ZOVIRAX) 200 MG capsule, Take 200 mg by mouth as needed. , Disp: ,  Rfl:  .  amLODipine (NORVASC) 10 MG tablet, Take 10 mg by mouth daily., Disp: , Rfl:  .  pantoprazole (PROTONIX) 40 MG tablet, Take 1 tablet (40 mg total) by mouth daily. (Patient not taking: Reported on 07/30/2019), Disp: 30 tablet, Rfl: 11 .  pantoprazole (PROTONIX) 40 MG tablet, Take 1 tablet (40 mg total) by mouth 2 (two) times daily before a meal. (Patient not taking: Reported on 07/30/2019), Disp: 180 tablet, Rfl: 3   Family History: family history includes Diabetes in his mother; Heart attack in his paternal uncle; Heart attack (age of onset: 30) in his brother; Heart attack (age of onset: 86)  in his father; Hypertension in his father.    Social History:   reports that he has never smoked. He has never used smokeless tobacco. He reports previous alcohol use. He reports current drug use. Frequency: 3.00 times per week. Drug: Marijuana.   Review of Systems: Constitutional: Denies weight loss/weight gain  Eyes: No changes in vision. ENT: No oral lesions, sore throat.  GI: see HPI.  Heme/Lymph: No easy bruising.  CV: No chest pain.  GU: + urinary retention s/p ED visits Integumentary: No rashes.  Neuro: No headaches.  Psych: No depression/anxiety.  Endocrine: No heat/cold intolerance.  Allergic/Immunologic: No urticaria.  Resp: No cough, SOB.  Musculoskeletal: No joint swelling.    Physical Examination: BP (!) 200/89 (BP Location: Right Arm, Patient Position: Sitting, Cuff Size: Large)   Pulse (!) 56   Temp (!) 97.1 F (36.2 C) (Temporal)   Ht 5\' 10"  (1.778 m)   Wt 158 lb 8 oz (71.9 kg)   BMI 22.74 kg/m  Gen: NAD, alert and oriented x 4 HEENT: PEERLA, EOMI, Neck: supple, no JVD Chest: CTA bilaterally, no wheezes, crackles, or other adventitious sounds CV: RRR, no m/g/c/r Abd: soft, NT, ND, +BS in all four quadrants; no HSM, guarding, ridigity, or rebound tenderness Ext: no edema, well perfused with 2+ pulses, Skin: no rash or lesions noted on observed skin Lymph: no noted  LAD  Data Reviewed:  11/2018-IMPRESSION: Barium swallow  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 No other abnormality seen in the esophagus.   10/2018-IMPRESSION:ABD [place]  1. No acute abnormality 2. Stable UPPER limits of normal CBD caliber, unchanged since 04/15/2017. No intrahepatic biliary dilatation. 3. Pancreas not visualized  05/2019-CBC with hemoglobin 11, MCV 96, A1c 7.2, BMP with glucose 159  09/2018-LFTs now   Assessment/Plan: Mr. Tansil is a 79 y.o. male    [place] was seen today for follow-up.  Diagnoses and all orders for this visit:  Chronic GERD  Esophageal dysphagia  Chronic constipation   1.  Chronic constipation-likely worsened starting oxycodone every other day.  He will try MiraLAX 1-2 times a day given he is failing stool softener and fiber.  If he does not improve he is to contact me and we will try something like Linzess.  We discussed increasing fiber and fluid intake UTD on colonoscopy done last year.  2.  Dysphagia-currently only to Metformin tablet.  He had barium swallow 6 months ago that was unremarkable except esophageal dysmotility.  If worsens would schedule EGD to rule out stricture, etc-to notify me if feels he needs EGD.  He will also check with pharmacy to see if he can crush Metformin  3.  Hypertension-patient's blood pressure elevated above [id number] systolic today in clinic, repeated x 2.  He has not taken his blood pressure medication yet, he will go home and take his blood pressure medicine.  He is to come to my office for a nurse only visit tomorrow to repeat blood pressure.  If this continues to be elevated he will need to see his PCP ASAP.  We reviewed importance of this.  He has no symptoms such as chest pain or shortness of breath currently and will go to ER if develops. He does not have BP cuff at home.   F/up in 3-4 months to address monitor dysphagia and address response to constipation  treatment, to call sooner if needed  25 min total pt care time, >50% face to face  I personally performed the service,  non-incident to. (WP)  Laurine Blazer, [facility]

## 2019-07-30 NOTE — Patient Instructions (Addendum)
Check with pharmacy to see if you can crush your metformin tablet.   I sent MiraLAX to your pharmacy to start-this is a powder you dissolved in water, start once a day can increase to twice a day if needed.  This should help your stools move better.  Constipation may be coming from pain medication.  Make sure you are drinking plenty of fluid throughout the day.  Also please try and get some fiber in your diet such as fresh fruits and vegetables.  Follow-up 4 to 6 months-please call sooner if needed and swallowing worsens in the meantime.

## 2019-07-31 ENCOUNTER — Telehealth (INDEPENDENT_AMBULATORY_CARE_PROVIDER_SITE_OTHER): Payer: Self-pay | Admitting: *Deleted

## 2019-07-31 NOTE — Telephone Encounter (Signed)
Patient presented to the office to have BP rechecked. At time of OV 07/30/2019 he has high BP readings time 2. Patient advised [place] that he had not taken his BP medication that morning as he had not eaten.  The patient's BP this morning was 180/81. He had taken his medication 45 minutes prior to coming in this morning.  We talked about his diet and he admits that he has been using right much salt. Patient also shard that yesterday afternoon he felt his heart go real fast.  He was advised to to follow up with his PCP , he was leaving our office and going there this morning.

## 2019-09-07 ENCOUNTER — Ambulatory Visit (HOSPITAL_COMMUNITY)
Admission: RE | Admit: 2019-09-07 | Discharge: 2019-09-07 | Disposition: A | Discharge status: Home / Self Care | Payer: Medicare HMO | Source: Ambulatory Visit | Attending: Family Medicine | Admitting: Family Medicine

## 2019-09-07 ENCOUNTER — Other Ambulatory Visit (HOSPITAL_COMMUNITY): Payer: Self-pay | Admitting: Family Medicine

## 2019-09-07 ENCOUNTER — Other Ambulatory Visit: Payer: Self-pay

## 2019-09-07 DIAGNOSIS — M25512 Pain in left shoulder: Secondary | ICD-10-CM

## 2019-09-17 ENCOUNTER — Ambulatory Visit (INDEPENDENT_AMBULATORY_CARE_PROVIDER_SITE_OTHER): Payer: Medicare HMO | Admitting: Nurse Practitioner

## 2019-09-30 ENCOUNTER — Ambulatory Visit (INDEPENDENT_AMBULATORY_CARE_PROVIDER_SITE_OTHER): Payer: Medicare HMO | Admitting: Urology

## 2019-09-30 ENCOUNTER — Other Ambulatory Visit: Payer: Self-pay

## 2019-09-30 VITALS — BP 175/90 | HR 80 | Temp 97.3°F | Ht 70.0 in | Wt 163.0 lb

## 2019-09-30 DIAGNOSIS — N401 Enlarged prostate with lower urinary tract symptoms: Secondary | ICD-10-CM

## 2019-09-30 DIAGNOSIS — R338 Other retention of urine: Secondary | ICD-10-CM

## 2019-09-30 DIAGNOSIS — R3912 Poor urinary stream: Secondary | ICD-10-CM | POA: Diagnosis not present

## 2019-09-30 LAB — BLADDER SCAN AMB NON-IMAGING: Scan Result: 56.7

## 2019-09-30 MED ORDER — CIPROFLOXACIN HCL 500 MG PO TABS
500.0000 mg | ORAL_TABLET | Freq: Once | ORAL | Status: AC
  Administered 2019-09-30: 500 mg via ORAL

## 2019-09-30 NOTE — Progress Notes (Signed)
Urological Symptom Review  Patient is experiencing the following symptoms: Hard to postpone urination Burning/pain with urination Get up at night to urinate Stream starts and stops Weak stream   Review of Systems  Gastrointestinal (upper)  : Negative for upper GI symptoms  Gastrointestinal (lower) : Negative for lower GI symptoms  Constitutional : Negative for symptoms  Skin: Itching  Eyes: Blurred vision  Ear/Nose/Throat : Sinus problems  Hematologic/Lymphatic: Negative for Hematologic/Lymphatic symptoms  Cardiovascular : Negative for cardiovascular symptoms  Respiratory : Negative for respiratory symptoms  Endocrine: Negative for endocrine symptoms  Musculoskeletal: Back pain Joint pain  Neurological: Negative for neurological symptoms  Psychologic: Negative for psychiatric symptoms

## 2019-09-30 NOTE — Progress Notes (Signed)
09/30/2019 9:31 AM   [name] 05/23/1941 [id number]  Referring provider: Lucia Gaskins, MD [place],  [place] [address]  Weak urinary stream  HPI: [name] is a 79yo here for followup for BPH with a weak urinary stream. He was doing well until 1 month ago when the stream became weaker and it takes several minutes for him to emptying his bladder. PVR 57cc. No hematuria. He feels there is a restriction in his mid penile shaft. Intermittent dysuria. No other associated symptoms. No exacerbating/allevaiting events. Patient had a TURP 05/24/2020   PMH: Past Medical History:  Diagnosis Date  . Arthritis    fingers, back  . Chronic back pain   . Diabetes mellitus Since 1995   Takes Glucovance and Onglyza daily  . Diabetes mellitus without complication ([place])   . GERD (gastroesophageal reflux disease)    takes Dexilant and Omeprazole daily  . Herpes   . History of colon polyps   . History of gastric ulcer 25+yrs ago  . Hyperlipidemia Since 1995   takes Simvastatin daily  . Hypertension Since 1995   takes Metoprolol and Ramipril daily  . Hypertension   . Pneumonia    hx of;as a child  . Sleep apnea    doesn't use CPAP  . Urinary frequency   . Urinary urgency    takes Flomax daily    Surgical History: Past Surgical History:  Procedure Laterality Date  . BACK SURGERY  1962   Lumbar  . BACK SURGERY    . bilateral cataract surgery    . CARDIAC CATHETERIZATION  02/16/10  . CERVICAL SPINE SURGERY    . COLONOSCOPY    . COLONOSCOPY N/A 04/23/2013   Procedure: COLONOSCOPY;  Surgeon: Rogene Houston, MD;  [facility];  Service: Endoscopy;  Laterality: N/A;  1030  . COLONOSCOPY N/A 07/24/2018   Procedure: COLONOSCOPY;  Surgeon: Rogene Houston, MD;  [facility];  Service: Endoscopy;  Laterality: N/A;  8:30  . ESOPHAGOGASTRODUODENOSCOPY N/A 12/24/2014   Procedure: ESOPHAGOGASTRODUODENOSCOPY (EGD);  Surgeon: Rogene Houston,  MD;  [facility]: [facility];  Service: Endoscopy;  Laterality: N/A;  2:45  . ESOPHAGOGASTRODUODENOSCOPY (EGD) WITH ESOPHAGEAL DILATION    . HAND SURGERY     Right  . LUMBAR LAMINECTOMY/DECOMPRESSION MICRODISCECTOMY N/A 08/29/2012   Procedure: LUMBAR LAMINECTOMY/DECOMPRESSION MICRODISCECTOMY 1 LEVEL;  Surgeon: Floyce Stakes, MD;  Location: [facility];  Service: Neurosurgery;  Laterality: N/A;  Lumbar four-five laminectomies  . NECK SURGERY    . PENILE PROSTHESIS IMPLANT    . ROTATOR CUFF REPAIR     Right  . SHOULDER SURGERY    . TRANSURETHRAL RESECTION OF PROSTATE N/A 05/25/2019   Procedure: TRANSURETHRAL RESECTION OF THE PROSTATE (TURP);  Surgeon: Cleon Gustin, MD;  Location: [facility];  Service: Urology;  Laterality: N/A;  . WRIST SURGERY     Left  . WRIST SURGERY      Home Medications:  Allergies as of 09/30/2019      Reactions   Aspirin Other (See Comments)   Higher dosage upsets stomach   Bayer Aspirin [aspirin] Other (See Comments)   Stomach "flares up"      Medication List       Accurate as of September 30, 2019  9:31 AM. If you have any questions, ask your nurse or doctor.        acyclovir 200 MG capsule Commonly known as: ZOVIRAX Take 200 mg by mouth as  needed.   amLODipine 10 MG tablet Commonly known as: NORVASC Take 10 mg by mouth daily.   glyBURIDE-metformin 5-500 MG tablet Commonly known as: GLUCOVANCE Take 2 tablets by mouth 2 (two) times daily with a meal.   labetalol 200 MG tablet Commonly known as: NORMODYNE Take 200 mg by mouth 2 (two) times daily.   metoprolol succinate 50 MG 24 hr tablet Commonly known as: TOPROL-XL Take 50 mg by mouth daily. Take with or immediately following a meal.   naproxen 500 MG tablet Commonly known as: NAPROSYN Take 500 mg by mouth 2 (two) times daily with a meal.   omeprazole 40 MG capsule Commonly known as: PRILOSEC Take 40 mg by mouth daily.   ondansetron 4 MG tablet Commonly known as: Zofran Take 1  tablet (4 mg total) by mouth daily as needed for nausea or vomiting.   oxyCODONE 5 MG immediate release tablet Commonly known as: Oxy IR/ROXICODONE Take 5 mg by mouth 3 (three) times daily. For pain   pantoprazole 40 MG tablet Commonly known as: Protonix Take 1 tablet (40 mg total) by mouth daily.   pantoprazole 40 MG tablet Commonly known as: PROTONIX Take 1 tablet (40 mg total) by mouth 2 (two) times daily before a meal.   polyethylene glycol powder 17 GM/SCOOP powder Commonly known as: GlycoLax Take 17 g by mouth daily.   ramipril 10 MG capsule Commonly known as: ALTACE Take 10 mg by mouth daily.   simvastatin 20 MG tablet Commonly known as: ZOCOR Take 20 mg by mouth daily.   tamsulosin 0.4 MG Caps capsule Commonly known as: FLOMAX Take 0.4 mg by mouth 2 (two) times daily.       Allergies:  Allergies  Allergen Reactions  . Aspirin Other (See Comments)    Higher dosage upsets stomach  . Bayer Aspirin [Aspirin] Other (See Comments)    Stomach "flares up"    Family History: Family History  Problem Relation Age of Onset  . Heart attack Father 10       MI  . Hypertension Father   . Heart attack Brother 30       MI  . Diabetes Mother   . Heart attack Paternal Uncle     Social History:  reports that he has never smoked. He has never used smokeless tobacco. He reports previous alcohol use. He reports current drug use. Frequency: 3.00 times per week. Drug: Marijuana.  ROS: All other review of systems were reviewed and are negative except what is noted above in HPI  Physical Exam: BP (!) 175/90   Pulse 80   Temp (!) 97.3 F (36.3 C)   Ht 5\' 10"  (1.778 m)   Wt 163 lb (73.9 kg)   BMI 23.39 kg/m   Constitutional:  Alert and oriented, No acute distress. HEENT: [place] AT, moist mucus membranes.  Trachea midline, no masses. Cardiovascular: No clubbing, cyanosis, or edema. Respiratory: Normal respiratory effort, no increased work of breathing. GI: Abdomen is  soft, nontender, nondistended, no abdominal masses GU: No CVA tenderness Lymph: No cervical or inguinal lymphadenopathy. Skin: No rashes, bruises or suspicious lesions. Neurologic: Grossly intact, no focal deficits, moving all 4 extremities. Psychiatric: Normal mood and affect.  Laboratory Data: Lab Results  Component Value Date   WBC 5.1 05/26/2019   HGB 11.0 (L) 05/26/2019   HCT 33.4 (L) 05/26/2019   MCV 96.0 05/26/2019   PLT 165 05/26/2019    Lab Results  Component Value Date   CREATININE 0.76 05/25/2019  No results found for: PSA  No results found for: TESTOSTERONE  Lab Results  Component Value Date   HGBA1C 7.2 (H) 05/22/2019    Urinalysis    Component Value Date/Time   COLORURINE YELLOW 06/19/2019 0827   APPEARANCEUR HAZY (A) 06/19/2019 0827   LABSPEC 1.014 06/19/2019 0827   PHURINE 5.0 06/19/2019 0827   GLUCOSEU NEGATIVE 06/19/2019 0827   HGBUR LARGE (A) 06/19/2019 0827   BILIRUBINUR NEGATIVE 06/19/2019 0827   KETONESUR NEGATIVE 06/19/2019 0827   PROTEINUR 100 (A) 06/19/2019 0827   UROBILINOGEN 0.2 05/15/2014 0122   NITRITE NEGATIVE 06/19/2019 0827   LEUKOCYTESUR LARGE (A) 06/19/2019 0827    Lab Results  Component Value Date   BACTERIA RARE (A) 06/19/2019    Pertinent Imaging:  No results found for this or any previous visit. No results found for this or any previous visit. No results found for this or any previous visit. No results found for this or any previous visit. No results found for this or any previous visit. No results found for this or any previous visit. No results found for this or any previous visit. Results for orders placed during the hospital encounter of 09/20/17  CT Renal Stone Study   Narrative CLINICAL DATA:  No new urinary tract stone. Low back pain for 2 weeks. Urinary retention and dysuria.  EXAM: CT ABDOMEN AND PELVIS WITHOUT CONTRAST  TECHNIQUE: Multidetector CT imaging of the abdomen and pelvis was  performed following the Irwin protocol without IV contrast.  COMPARISON:  CT of the abdomen and pelvis 01/25/2012. Lumbar spine radiographs 04/24/2017.  FINDINGS: Lower chest: Excellent recall bronchiectasis is again noted at the lung bases bilaterally without superimposed airspace disease. The heart size is normal. No significant pleural or pericardial effusion is present.  Hepatobiliary: No focal liver abnormality is seen. No gallstones, gallbladder wall thickening, or biliary dilatation.  Pancreas: Unremarkable. No pancreatic ductal dilatation or surrounding inflammatory changes.  Spleen: Calcification along the lateral margin of the spleen is stable. No focal lesions are evident.  Adrenals/Urinary Tract: Adrenal glands are normal bilaterally. No stone or mass lesion is present. There is no obstruction. The ureters are within normal limits bilaterally. The urinary bladder is within normal limits.  Stomach/Bowel: The stomach and duodenum are within normal limits. Small bowel is unremarkable. The terminal ileum is within normal limits. The appendix is visualized and normal.  Vascular/Lymphatic: Atherosclerotic calcifications are present without aneurysm.  Reproductive: A penile prosthesis is noted. The prostate gland is somewhat prominent and indents the inferior margin of the urinary bladder. It measures 4 cm in transverse diameter.  Other: No abdominal wall hernia or abnormality. No abdominopelvic ascites.  Musculoskeletal: Progressive endplate degenerative changes and sclerosis are present at L3-4, L4-5, and L5-S1. A transitional S1 segment is again noted. Foraminal narrowing is evident bilaterally at L4-5 and L5-S1.  IMPRESSION: 1. No urinary tract obstruction or stone. 2. Prominent prostate gland indents the inferior border of the urinary bladder. 3. progressive degenerative changes in the lower lumbar spine. 4. Penile prosthesis.   Electronically  Signed   By: San Morelle [name]   On: 09/20/2017 16:28      10/07/19  CC: No chief complaint on file.   HPI:  Blood pressure (!) 175/90, pulse 80, temperature (!) 97.3 F (36.3 C), height 5\' 10"  (1.778 m), weight 163 lb (73.9 kg). NED. A&Ox3.   No respiratory distress   Abd soft, NT, ND Normal phallus with bilateral descended testicles  Cystoscopy Procedure Note  Patient identification was confirmed, informed consent was obtained, and patient was prepped using Betadine solution.  Lidocaine jelly was administered per urethral meatus.     Pre-Procedure: - Inspection reveals a normal caliber ureteral meatus.  Procedure: The flexible cystoscope was introduced without difficulty - Bulbar urethral stricture encountered. Cystoscope was removed and we sequentially dialted the stricture from 8 french to 24 french - Normal prostate well resected - Normal bladder neck - Bilateral ureteral orifices identified - Bladder mucosa  reveals no ulcers, tumors, or lesions - No bladder stones - No trabeculation -18 french foley placed after the procedure   Post-Procedure: - Patient tolerated the procedure well  Assessment/ Plan:   No follow-ups on file.  Nicolette Bang, MD  Assessment & Plan:    1. Benign prostatic 2hyperplasia with urinary retention -continue observation - BLADDER SCAN AMB NON-IMAGING  2. Weak urinary stream  related to urethral stricture:  3. Bulbar urethral stricture -voiding trial in 2 days due to IPP. If he passes voiding trial RTC 1 month with PVR   No follow-ups on file.  Nicolette Bang, MD  [facility] [place]

## 2019-10-02 ENCOUNTER — Other Ambulatory Visit: Payer: Self-pay

## 2019-10-02 ENCOUNTER — Ambulatory Visit (INDEPENDENT_AMBULATORY_CARE_PROVIDER_SITE_OTHER): Payer: Medicare HMO

## 2019-10-02 VITALS — Temp 97.3°F

## 2019-10-02 DIAGNOSIS — N401 Enlarged prostate with lower urinary tract symptoms: Secondary | ICD-10-CM

## 2019-10-02 DIAGNOSIS — R338 Other retention of urine: Secondary | ICD-10-CM | POA: Diagnosis not present

## 2019-10-02 NOTE — Progress Notes (Signed)
Fill and Pull Catheter Removal  Patient is present today for a catheter removal.  Patient was cleaned and prepped in a sterile fashion 150 ml of sterile water/ saline was instilled into the bladder when the patient felt the urge to urinate. 10 ml of water was then drained from the balloon.  A 16 FR foley cath was removed from the bladder no complications were noted .  Patient as then given some time to void on their own.  Patient can void  150 ml on their own after some time.  Patient tolerated well.  Performed by: H. Dorin Stooksbury LPN  Follow up/ Additional notes: 1 month OV with PVR

## 2019-10-07 ENCOUNTER — Encounter: Payer: Self-pay | Admitting: Urology

## 2019-10-07 NOTE — Patient Instructions (Signed)

## 2019-11-02 ENCOUNTER — Ambulatory Visit: Payer: Medicare HMO | Admitting: Urology

## 2019-11-04 ENCOUNTER — Ambulatory Visit (INDEPENDENT_AMBULATORY_CARE_PROVIDER_SITE_OTHER): Payer: Medicare HMO | Admitting: Urology

## 2019-11-04 ENCOUNTER — Encounter: Payer: Self-pay | Admitting: Urology

## 2019-11-04 ENCOUNTER — Other Ambulatory Visit: Payer: Self-pay

## 2019-11-04 VITALS — BP 178/85 | HR 64 | Temp 98.4°F | Ht 70.0 in | Wt 163.0 lb

## 2019-11-04 DIAGNOSIS — R338 Other retention of urine: Secondary | ICD-10-CM | POA: Diagnosis not present

## 2019-11-04 DIAGNOSIS — R3912 Poor urinary stream: Secondary | ICD-10-CM

## 2019-11-04 DIAGNOSIS — N401 Enlarged prostate with lower urinary tract symptoms: Secondary | ICD-10-CM

## 2019-11-04 DIAGNOSIS — N35011 Post-traumatic bulbous urethral stricture: Secondary | ICD-10-CM

## 2019-11-04 LAB — BLADDER SCAN AMB NON-IMAGING: Scan Result: 27.3

## 2019-11-04 LAB — POCT URINALYSIS DIPSTICK
Bilirubin, UA: NEGATIVE
Blood, UA: NEGATIVE
Glucose, UA: NEGATIVE
Ketones, UA: NEGATIVE
Leukocytes, UA: NEGATIVE
Nitrite, UA: NEGATIVE
Protein, UA: NEGATIVE
Spec Grav, UA: <=1.005 — AB (ref 1.010–1.025)
Urobilinogen, UA: 0.2 E.U./dL
pH, UA: 5 (ref 5.0–8.0)

## 2019-11-04 MED ORDER — CIPROFLOXACIN HCL 500 MG PO TABS: 500.0000 mg | ORAL_TABLET | Freq: Once | ORAL | Status: DC

## 2019-11-04 NOTE — Progress Notes (Signed)
Urological Symptom Review  Patient is experiencing the following symptoms: Frequent urination Burning/pain with urination Leakage of urine Have to strain to urinate Weak stream   Review of Systems  Gastrointestinal (upper)  : Nausea  Gastrointestinal (lower) : Negative for lower GI symptoms  Constitutional : Negative for symptoms  Skin: itching  Eyes: Blurred vision  Ear/Nose/Throat : Negative for Ear/Nose/Throat symptoms  Hematologic/Lymphatic: Negative for Hematologic/Lymphatic symptoms  Cardiovascular : Chest pain  Respiratory : Negative for respiratory symptoms  Endocrine: Negative for endocrine symptoms  Musculoskeletal: Back pain  Neurological: Headaches Dizziness  Psychologic: Negative for psychiatric symptoms

## 2019-11-04 NOTE — Patient Instructions (Signed)
Clean Intermittent Catheterization, Male  Clean intermittent catheterization (CIC) is a procedure to remove urine from the bladder by placing a small, flexible tube (catheter) into the bladder though the urethra. The urethra is a tube in the body that carries urine from the bladder out of the body. CIC may be done when:  You cannot completely empty your bladder on your own. This may be due to a blockage in the bladder or urethra.  Your bladder leaks urine. This may happen when the muscles or nerves near the bladder are not working normally, so the bladder overflows. Your health care provider will show you how to perform CIC and will help you to become comfortable performing this procedure at home. Your health care provider will also help you to get the home care supplies that are needed for this procedure. Supplies needed:  Germ-free (sterile), water-based lubricant.  A container for urine collection. You may also use the toilet to dispose of urine from the catheter.  A catheter. Your health care provider will determine the best size for you. ? Use this catheter size: ______________________________  Sterile gloves.  Sterile gauze.  Medicated sterile swabs. How to perform this procedure: Most people need CIC at least 4 times per day to adequately empty the bladder. Your health care provider will tell you how often you should perform CIC.  Number of times per day to perform CIC: ______________________________________________________________________ To perform CIC, follow these steps: 1. Wash your hands with soap and water. If soap and water are not available, use hand sanitizer. 2. Prepare the supplies that you will use during the procedure. Open the catheter pack, the lubricant, and the pack of medicated sterile swabs. If you have been told to keep the procedure sterile, do not touch your supplies until you are wearing gloves. 3. Get in a comfortable position. Possible positions  include: ? Sitting on a toilet, a chair, or the edge of a bed. ? Standing near a toilet. ? Lying down with your head raised on pillows and your knees pointing to the ceiling. You may wish to place a waterproof mat or pad under you. 4. If you are using a urine collection container, position it between your legs. 5. Urinate, if you are able. 6. Put on gloves. 7. Apply lubricant to about 2 inches (5 cm) of the tip of the catheter. 8. Set the catheter down on a clean, dry surface within reach. 9. Gently stretch your penis out from your body. Pull back any skin that covers the end of your penis (foreskin). Clean the end of your penis with medicated sterile swabs as told by your health care provider. 10. Hold your penis upward at a 45-60 degree angle. This helps to straighten the urethra. 11. Slowly insert the lubricated catheter straight into your urethra until urine flows freely. This is usually about 6-8 inches (15-20 cm). 12. When urine starts to flow freely, insert the catheter 1 inch (3 cm) more. Allow urine to drain into the toilet or the urine collection container. 13. When urine stops flowing, slowly remove the catheter. 14. Note the color, amount, and odor of the urine. 15. Measure your urine and note the amount, if told by your health care provider. 16. Discard the urine in the toilet. 38. Clean your penis using soap and water. 18. Move the foreskin back in place, if applicable. 19. If you are using a single-use catheter, discard the catheter and supplies. 6. Wash your hands with soap and water. 21.  If you are using a reusable catheter, follow package instructions about how to clean the catheter after each use. How often should I perform this procedure?  Do CIC to empty your bladder every 4-6 hours or as often as told by your health care provider.  If you have symptoms of too much urine in your bladder (overdistension) and you are not able to urinate, perform CIC. Symptoms of  overdistension may include: ? Restlessness. ? Sweating or chills. ? Headache. ? Flushed or pale skin. ? Bloated lower abdomen. What are the risks? Generally, this is a safe procedure, however problems may occur, including:  Infection.  Injury to the urethra.  Irritation of the urethra. Follow these instructions at home  General instructions  Drink enough fluid to keep your urine pale yellow.  Dispose of a multiple use catheter when it becomes dry, brittle, or cloudy. This usually happens after you use the catheter for 1 week.  Avoid caffeine. Caffeine may make you need to urinate more frequently and more urgently.  When traveling, bring extra supplies with you in case of delays. Keep supplies with you in a place that you can access easily. If traveling by plane: ? Make sure that the lubricant in your carry-on bag is less than 3.4 ounces (100 mL). ? Use a single-use catheter. It may be difficult to clean a reusable catheter in a small bathroom.  Take over-the-counter and prescription medicines only as told by your health care provider.  Keep all follow-up visits as told by your health care provider. This is important. Contact a health care provider if you:  Have difficulty performing CIC.  Have urine leaking during CIC.  Have: ? Dark or cloudy urine. ? Blood in your urine or in your catheter. ? A change in the smell of your urine or discharge. ? A burning feeling while you urinate.  Feel nauseous or you vomit.  Have pain in your abdomen, your back, or your sides below your ribs.  Have swelling or redness around the opening of your urethra.  Develop a rash or sores on your skin. Get help right away if you have:  A fever.  Symptoms that do not go away after 3 days.  Symptoms that suddenly get worse.  Severe pain.  A decrease in the amount of urine that drains from your bladder. Summary  Clean intermittent catheterization (CIC) is a procedure to remove urine  from the bladder by placing a small, flexible tube (catheter) into the bladder though the urethra.  Your health care provider will show you how to perform CIC and will help you to become comfortable performing this procedure at home.  Most people need CIC at least 4 times per day to adequately empty the bladder. This information is not intended to replace advice given to you by your health care provider. Make sure you discuss any questions you have with your health care provider. Document Revised: 10/15/2018 Document Reviewed: 02/13/2018 Elsevier Patient Education  [employer].

## 2019-11-04 NOTE — Progress Notes (Signed)
11/04/2019 3:06 PM   [name] 1940/11/22 [id number]  Referring provider: Lucia Gaskins, MD [name],  [place] [address]  Weak stream  HPI: [name] is a 79yo with a hx of urethral stricture and weak stream here for followup. He had a urethral dilation 1 month ago and was doing well until 10-14 days ago when his stream became weaker and he noted a splitting of his stream.  No dysuria. No hematuria. No other associated symptoms   PMH: Past Medical History:  Diagnosis Date  . Arthritis    fingers, back  . Chronic back pain   . Diabetes mellitus Since 1995   Takes Glucovance and Onglyza daily  . Diabetes mellitus without complication ([place])   . GERD (gastroesophageal reflux disease)    takes Dexilant and Omeprazole daily  . Herpes   . History of colon polyps   . History of gastric ulcer 25+yrs ago  . Hyperlipidemia Since 1995   takes Simvastatin daily  . Hypertension Since 1995   takes Metoprolol and Ramipril daily  . Hypertension   . Pneumonia    hx of;as a child  . Sleep apnea    doesn't use CPAP  . Urinary frequency   . Urinary urgency    takes Flomax daily    Surgical History: Past Surgical History:  Procedure Laterality Date  . BACK SURGERY  1962   Lumbar  . BACK SURGERY    . bilateral cataract surgery    . CARDIAC CATHETERIZATION  02/16/10  . CERVICAL SPINE SURGERY    . COLONOSCOPY    . COLONOSCOPY N/A 04/23/2013   Procedure: COLONOSCOPY;  Surgeon: Rogene Houston, MD;  Location: [facility];  Service: Endoscopy;  Laterality: N/A;  1030  . COLONOSCOPY N/A 07/24/2018   Procedure: COLONOSCOPY;  Surgeon: Rogene Houston, MD;  Location: [facility];  Service: Endoscopy;  Laterality: N/A;  8:30  . ESOPHAGOGASTRODUODENOSCOPY N/A 12/24/2014   Procedure: ESOPHAGOGASTRODUODENOSCOPY (EGD);  Surgeon: Rogene Houston, MD;  Location: [facility];  Service: Endoscopy;  Laterality: N/A;  2:45  . ESOPHAGOGASTRODUODENOSCOPY (EGD) WITH  ESOPHAGEAL DILATION    . HAND SURGERY     Right  . LUMBAR LAMINECTOMY/DECOMPRESSION MICRODISCECTOMY N/A 08/29/2012   Procedure: LUMBAR LAMINECTOMY/DECOMPRESSION MICRODISCECTOMY 1 LEVEL;  Surgeon: Floyce Stakes, MD;  Location: [facility];  Service: Neurosurgery;  Laterality: N/A;  Lumbar four-five laminectomies  . NECK SURGERY    . PENILE PROSTHESIS IMPLANT    . ROTATOR CUFF REPAIR     Right  . SHOULDER SURGERY    . TRANSURETHRAL RESECTION OF PROSTATE N/A 05/25/2019   Procedure: TRANSURETHRAL RESECTION OF THE PROSTATE (TURP);  Surgeon: Cleon Gustin, MD;  Location: [facility];  Service: Urology;  Laterality: N/A;  . WRIST SURGERY     Left  . WRIST SURGERY      Home Medications:  Allergies as of 11/04/2019      Reactions   Aspirin Other (See Comments)   Higher dosage upsets stomach   Bayer Aspirin [aspirin] Other (See Comments)   Stomach "flares up"      Medication List       Accurate as of November 04, 2019  3:06 PM. If you have any questions, ask your nurse or doctor.        acyclovir 200 MG capsule Commonly known as: ZOVIRAX Take 200 mg by mouth as needed.   amLODipine 10 MG tablet Commonly known as: NORVASC Take 10 mg  by mouth daily.   glyBURIDE-metformin 5-500 MG tablet Commonly known as: GLUCOVANCE Take 2 tablets by mouth 2 (two) times daily with a meal.   labetalol 200 MG tablet Commonly known as: NORMODYNE Take 200 mg by mouth 2 (two) times daily.   metoprolol succinate 50 MG 24 hr tablet Commonly known as: TOPROL-XL Take 50 mg by mouth daily. Take with or immediately following a meal.   naproxen 500 MG tablet Commonly known as: NAPROSYN Take 500 mg by mouth 2 (two) times daily with a meal.   omeprazole 40 MG capsule Commonly known as: PRILOSEC Take 40 mg by mouth daily.   ondansetron 4 MG tablet Commonly known as: Zofran Take 1 tablet (4 mg total) by mouth daily as needed for nausea or vomiting.   OneTouch Delica Plus [id number] Misc USE TO  TEST TWICE DAILY   OneTouch Verio Flex System w/Device Kit USE TO TEST TWICE DAILY   OneTouch Verio test strip Generic drug: glucose blood USE TO TEST TWICE DAILY   oxyCODONE 5 MG immediate release tablet Commonly known as: Oxy IR/ROXICODONE Take 5 mg by mouth 3 (three) times daily. For pain   pantoprazole 40 MG tablet Commonly known as: Protonix Take 1 tablet (40 mg total) by mouth daily.   pantoprazole 40 MG tablet Commonly known as: PROTONIX Take 1 tablet (40 mg total) by mouth 2 (two) times daily before a meal.   polyethylene glycol powder 17 GM/SCOOP powder Commonly known as: GlycoLax Take 17 g by mouth daily.   ramipril 10 MG capsule Commonly known as: ALTACE Take 10 mg by mouth daily.   simvastatin 20 MG tablet Commonly known as: ZOCOR Take 20 mg by mouth daily.   tamsulosin 0.4 MG Caps capsule Commonly known as: FLOMAX Take 0.4 mg by mouth 2 (two) times daily.       Allergies:  Allergies  Allergen Reactions  . Aspirin Other (See Comments)    Higher dosage upsets stomach  . Bayer Aspirin [Aspirin] Other (See Comments)    Stomach "flares up"    Family History: Family History  Problem Relation Age of Onset  . Heart attack Father 53       MI  . Hypertension Father   . Heart attack Brother 23       MI  . Diabetes Mother   . Heart attack Paternal Uncle     Social History:  reports that he has never smoked. He has never used smokeless tobacco. He reports previous alcohol use. He reports current drug use. Frequency: 3.00 times per week. Drug: Marijuana.  ROS: All other review of systems were reviewed and are negative except what is noted above in HPI  Physical Exam: BP (!) 178/85   Pulse 64   Temp 98.4 F (36.9 C)   Ht '5\' 10"'$  (1.778 m)   Wt 163 lb (73.9 kg)   BMI 23.39 kg/m   Constitutional:  Alert and oriented, No acute distress. HEENT: [place] AT, moist mucus membranes.  Trachea midline, no masses. Cardiovascular: No clubbing, cyanosis, or  edema. Respiratory: Normal respiratory effort, no increased work of breathing. GI: Abdomen is soft, nontender, nondistended, no abdominal masses GU: No CVA tenderness.  Lymph: No cervical or inguinal lymphadenopathy. Skin: No rashes, bruises or suspicious lesions. Neurologic: Grossly intact, no focal deficits, moving all 4 extremities. Psychiatric: Normal mood and affect.  Laboratory Data: Lab Results  Component Value Date   WBC 5.1 05/26/2019   HGB 11.0 (L) 05/26/2019   HCT 33.4 (  L) 05/26/2019   MCV 96.0 05/26/2019   PLT 165 05/26/2019    Lab Results  Component Value Date   CREATININE 0.76 05/25/2019    No results found for: PSA  No results found for: TESTOSTERONE  Lab Results  Component Value Date   HGBA1C 7.2 (H) 05/22/2019    Urinalysis    Component Value Date/Time   COLORURINE YELLOW 06/19/2019 0827   APPEARANCEUR HAZY (A) 06/19/2019 0827   LABSPEC 1.014 06/19/2019 0827   PHURINE 5.0 06/19/2019 0827   GLUCOSEU NEGATIVE 06/19/2019 0827   HGBUR LARGE (A) 06/19/2019 0827   BILIRUBINUR neg 11/04/2019 1451   KETONESUR NEGATIVE 06/19/2019 0827   PROTEINUR Negative 11/04/2019 1451   PROTEINUR 100 (A) 06/19/2019 0827   UROBILINOGEN 0.2 11/04/2019 1451   UROBILINOGEN 0.2 05/15/2014 0122   NITRITE neg 11/04/2019 1451   NITRITE NEGATIVE 06/19/2019 0827   LEUKOCYTESUR Negative 11/04/2019 1451   LEUKOCYTESUR LARGE (A) 06/19/2019 0827    Lab Results  Component Value Date   BACTERIA RARE (A) 06/19/2019     11/04/19  CC: weak stream  HPI: [name] is a 79yo with a hx of bulbar urethral stricture who noted a weaker stream over the past 2 weeks.   Blood pressure (!) 178/85, pulse 64, temperature 98.4 F (36.9 C), height '5\' 10"'$  (1.778 m), weight 163 lb (73.9 kg). NED. A&Ox3.   No respiratory distress   Abd soft, NT, ND Normal phallus with bilateral descended testicles  Cystoscopy Procedure Note  Patient identification was confirmed, informed consent was  obtained, and patient was prepped using Betadine solution.  Lidocaine jelly was administered per urethral meatus.     Pre-Procedure: - Inspection reveals a normal caliber ureteral meatus.  Procedure: The flexible cystoscope was introduced without difficulty - mild bulbar urethral stricture dilated with the cystoscope -well resected prostatic fossa - Bilateral ureteral orifices identified - Bladder mucosa  reveals no ulcers, tumors, or lesions - No bladder stones - No trabeculation    Post-Procedure: - Patient tolerated the procedure well    Return in about 3 months (around 02/03/2020).  Nicolette Bang, MD  Pertinent Imaging: No results found for this or any previous visit. No results found for this or any previous visit. No results found for this or any previous visit. No results found for this or any previous visit. No results found for this or any previous visit. No results found for this or any previous visit. No results found for this or any previous visit. Results for orders placed during the hospital encounter of 09/20/17  CT Renal Stone Study   Narrative CLINICAL DATA:  No new urinary tract stone. Low back pain for 2 weeks. Urinary retention and dysuria.  EXAM: CT ABDOMEN AND PELVIS WITHOUT CONTRAST  TECHNIQUE: Multidetector CT imaging of the abdomen and pelvis was performed following the standard protocol without IV contrast.  COMPARISON:  CT of the abdomen and pelvis 01/25/2012. Lumbar spine radiographs 04/24/2017.  FINDINGS: Lower chest: Excellent recall bronchiectasis is again noted at the lung bases bilaterally without superimposed airspace disease. The heart size is normal. No significant pleural or pericardial effusion is present.  Hepatobiliary: No focal liver abnormality is seen. No gallstones, gallbladder wall thickening, or biliary dilatation.  Pancreas: Unremarkable. No pancreatic ductal dilatation or surrounding inflammatory  changes.  Spleen: Calcification along the lateral margin of the spleen is stable. No focal lesions are evident.  Adrenals/Urinary Tract: Adrenal glands are normal bilaterally. No stone or mass lesion is present. There is  no obstruction. The ureters are within normal limits bilaterally. The urinary bladder is within normal limits.  Stomach/Bowel: The stomach and duodenum are within normal limits. Small bowel is unremarkable. The terminal ileum is within normal limits. The appendix is visualized and normal.  Vascular/Lymphatic: Atherosclerotic calcifications are present without aneurysm.  Reproductive: A penile prosthesis is noted. The prostate gland is somewhat prominent and indents the inferior margin of the urinary bladder. It measures 4 cm in transverse diameter.  Other: No abdominal wall hernia or abnormality. No abdominopelvic ascites.  Musculoskeletal: Progressive endplate degenerative changes and sclerosis are present at L3-4, L4-5, and L5-S1. A transitional S1 segment is again noted. Foraminal narrowing is evident bilaterally at L4-5 and L5-S1.  IMPRESSION: 1. No urinary tract obstruction or stone. 2. Prominent prostate gland indents the inferior border of the urinary bladder. 3. progressive degenerative changes in the lower lumbar spine. 4. Penile prosthesis.   Electronically Signed   By: San Morelle [name]   On: 09/20/2017 16:28     Assessment & Plan:    1. Benign prostatic hyperplasia with urinary retention -resolved - BLADDER SCAN AMB NON-IMAGING - POCT urinalysis dipstick - ciprofloxacin (CIPRO) tablet 500 mg  2. Weak urinary stream likely due to recurrance of urethral stricture  3. Post-traumatic bulbous urethral stricture Patient instructed on CIC 2x per week. RTC 3 months   Return in about 3 months (around 02/03/2020).  Nicolette Bang, MD  [facility] [place]

## 2019-11-16 ENCOUNTER — Emergency Department (HOSPITAL_COMMUNITY): Payer: Medicare HMO

## 2019-11-16 ENCOUNTER — Other Ambulatory Visit: Payer: Self-pay

## 2019-11-16 ENCOUNTER — Emergency Department (HOSPITAL_COMMUNITY)
Admission: EM | Admit: 2019-11-16 | Discharge: 2019-11-17 | Disposition: A | Discharge status: Home / Self Care | Payer: Medicare HMO | Attending: Emergency Medicine | Admitting: Emergency Medicine

## 2019-11-16 ENCOUNTER — Encounter (HOSPITAL_COMMUNITY): Payer: Self-pay | Admitting: Emergency Medicine

## 2019-11-16 DIAGNOSIS — I10 Essential (primary) hypertension: Secondary | ICD-10-CM | POA: Insufficient documentation

## 2019-11-16 DIAGNOSIS — Z7984 Long term (current) use of oral hypoglycemic drugs: Secondary | ICD-10-CM | POA: Insufficient documentation

## 2019-11-16 DIAGNOSIS — E119 Type 2 diabetes mellitus without complications: Secondary | ICD-10-CM | POA: Insufficient documentation

## 2019-11-16 DIAGNOSIS — R519 Headache, unspecified: Secondary | ICD-10-CM | POA: Diagnosis not present

## 2019-11-16 DIAGNOSIS — R0789 Other chest pain: Secondary | ICD-10-CM | POA: Diagnosis not present

## 2019-11-16 LAB — CBC WITH DIFFERENTIAL/PLATELET
Abs Immature Granulocytes: 0 10*3/uL (ref 0.00–0.07)
Basophils Absolute: 0 10*3/uL (ref 0.0–0.1)
Basophils Relative: 1 %
Eosinophils Absolute: 0.1 10*3/uL (ref 0.0–0.5)
Eosinophils Relative: 4 %
HCT: 34.5 % — ABNORMAL LOW (ref 39.0–52.0)
Hemoglobin: 11.4 g/dL — ABNORMAL LOW (ref 13.0–17.0)
Immature Granulocytes: 0 %
Lymphocytes Relative: 47 %
Lymphs Abs: 1.6 10*3/uL (ref 0.7–4.0)
MCH: 30.8 pg (ref 26.0–34.0)
MCHC: 33 g/dL (ref 30.0–36.0)
MCV: 93.2 fL (ref 80.0–100.0)
Monocytes Absolute: 0.3 10*3/uL (ref 0.1–1.0)
Monocytes Relative: 9 %
Neutro Abs: 1.3 10*3/uL — ABNORMAL LOW (ref 1.7–7.7)
Neutrophils Relative %: 39 %
Platelets: 152 10*3/uL (ref 150–400)
RBC: 3.7 MIL/uL — ABNORMAL LOW (ref 4.22–5.81)
RDW: 13.5 % (ref 11.5–15.5)
WBC: 3.4 10*3/uL — ABNORMAL LOW (ref 4.0–10.5)
nRBC: 0 % (ref 0.0–0.2)

## 2019-11-16 MED ORDER — ACETAMINOPHEN 325 MG PO TABS
650.0000 mg | ORAL_TABLET | Freq: Once | ORAL | Status: AC
  Administered 2019-11-16: 23:00:00 650 mg via ORAL
  Filled 2019-11-16: qty 2

## 2019-11-16 NOTE — ED Triage Notes (Signed)
Pt c/o headache and high blood pressure since Saturday.

## 2019-11-16 NOTE — ED Provider Notes (Signed)
[facility] EMERGENCY DEPARTMENT Provider Note   CSN: [id number] Arrival date & time: 11/16/19  2147     History Chief Complaint  Patient presents with  . Headache    DEVUN ANNA is a 79 y.o. male.  HPI     This is a 79 year old male with history of diabetes, hypertension, hyperlipidemia who presents with headache.  Patient states that he had onset of headache on Thursday.  He saw his primary physician on Friday and had blood pressure readings reportedly in the 200s over 100s.  He was started on a blood pressure medication 3 times a day.  He states that since that time he has had intermittent headache and headache returned tonight.  He describes the headache as splitting and across the top of his head.  No prior history of headaches.  He reports associated dizziness which he describes as lightheadedness" I feel like I might fall."  Denies any room spinning.  Denies any weakness, numbness, speech difficulty.  He initially rated his headache at 10 out of 10.  However, now it is 6 out of 10.  He did not take anything for pain.  On review of systems he reports that he also has had some chest pain.  Not have any chest pain right now but reports chest heaviness "a lot of the time."  It is not necessarily exertional.  No recent fevers or cough.  Past Medical History:  Diagnosis Date  . Arthritis    fingers, back  . Chronic back pain   . Diabetes mellitus Since 1995   Takes Glucovance and Onglyza daily  . Diabetes mellitus without complication ([place])   . GERD (gastroesophageal reflux disease)    takes Dexilant and Omeprazole daily  . Herpes   . History of colon polyps   . History of gastric ulcer 25+yrs ago  . Hyperlipidemia Since 1995   takes Simvastatin daily  . Hypertension Since 1995   takes Metoprolol and Ramipril daily  . Hypertension   . Pneumonia    hx of;as a child  . Sleep apnea    doesn't use CPAP  . Urinary frequency   . Urinary urgency    takes Flomax daily     Patient Active Problem List   Diagnosis Date Noted  . 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 Weak urinary stream 09/30/2019  . BPH (benign prostatic hyperplasia) 05/25/2019  . Dysphagia 11/24/2018  . History of colonic polyps 04/24/2018  . 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 GERD (gastroesophageal reflux disease) 09/11/2011  . High cholesterol 09/11/2011  . Bronchitis 09/11/2011  . DM 02/13/2010  . ESSENTIAL HYPERTENSION, BENIGN 02/13/2010  . PRECORDIAL PAIN 02/13/2010    Past Surgical History:  Procedure Laterality Date  . BACK SURGERY  1962   Lumbar  . BACK SURGERY    . bilateral cataract surgery    . CARDIAC CATHETERIZATION  02/16/10  . CERVICAL SPINE SURGERY    . COLONOSCOPY    . COLONOSCOPY N/A 04/23/2013   Procedure: COLONOSCOPY;  Surgeon: Rogene Houston, MD;  [facility];  Service: Endoscopy;  Laterality: N/A;  1030  . COLONOSCOPY N/A 07/24/2018   Procedure: COLONOSCOPY;  Surgeon: Rogene Houston, MD;  [facility];  Service: Endoscopy;  Laterality: N/A;  8:30  . ESOPHAGOGASTRODUODENOSCOPY N/A 12/24/2014   Procedure: ESOPHAGOGASTRODUODENOSCOPY (EGD);  Surgeon: Rogene Houston, MD;  [facility];  Service: Endoscopy;  Laterality: N/A;  2:45  . ESOPHAGOGASTRODUODENOSCOPY (EGD) WITH ESOPHAGEAL  DILATION    . HAND SURGERY     Right  . LUMBAR LAMINECTOMY/DECOMPRESSION MICRODISCECTOMY N/A 08/29/2012   Procedure: LUMBAR LAMINECTOMY/DECOMPRESSION MICRODISCECTOMY 1 LEVEL;  Surgeon: Floyce Stakes, MD;  Location: [facility];  Service: Neurosurgery;  Laterality: N/A;  Lumbar four-five laminectomies  . NECK SURGERY    . PENILE PROSTHESIS IMPLANT    . ROTATOR CUFF REPAIR     Right  . SHOULDER SURGERY    . TRANSURETHRAL RESECTION OF PROSTATE N/A 05/25/2019   Procedure: TRANSURETHRAL RESECTION OF THE PROSTATE (TURP);  Surgeon: Cleon Gustin, MD;  Location: [facility];  Service: Urology;  Laterality: N/A;  . WRIST SURGERY      Left  . WRIST SURGERY         Family History  Problem Relation Age of Onset  . Heart attack Father 40       MI  . Hypertension Father   . Heart attack Brother 24       MI  . Diabetes Mother   . Heart attack Paternal Uncle     Social History   Tobacco Use  . Smoking status: Never Smoker  . Smokeless tobacco: Never Used  Substance Use Topics  . Alcohol use: Not Currently    Comment: occasionally beer but not since 11/2018  . Drug use: Yes    Frequency: 3.0 times per week    Types: Marijuana    Home Medications Prior to Admission medications   Medication Sig Start Date End Date Taking? Authorizing Provider  acyclovir (ZOVIRAX) 200 MG capsule Take 200 mg by mouth as needed.  01/07/14   [provider]  amLODipine (NORVASC) 10 MG tablet Take 10 mg by mouth daily.    [provider]  Blood Glucose Monitoring Suppl (ONETOUCH VERIO FLEX SYSTEM) w/Device KIT USE TO TEST TWICE DAILY 09/30/19   [provider]  glyBURIDE-metformin (GLUCOVANCE) 5-500 MG per tablet Take 2 tablets by mouth 2 (two) times daily with a meal.     [provider]  labetalol (NORMODYNE) 200 MG tablet Take 200 mg by mouth 2 (two) times daily.    [provider]  Lancets (ONETOUCH DELICA PLUS [id number]) [place] USE TO TEST TWICE DAILY 09/30/19   [provider]  metoprolol succinate (TOPROL-XL) 50 MG 24 hr tablet Take 50 mg by mouth daily. Take with or immediately following a meal.    [provider]  naproxen (NAPROSYN) 500 MG tablet Take 500 mg by mouth 2 (two) times daily with a meal.    [provider]  omeprazole (PRILOSEC) 40 MG capsule Take 40 mg by mouth daily.    [provider]  ondansetron (ZOFRAN) 4 MG tablet Take 1 tablet (4 mg total) by mouth daily as needed for nausea or vomiting. 01/05/19 01/05/20  Setzer, Rona Ravens, NP  ONETOUCH VERIO test strip USE TO TEST TWICE DAILY 09/30/19   [provider]  oxyCODONE (OXY  IR/ROXICODONE) 5 MG immediate release tablet Take 5 mg by mouth 3 (three) times daily. For pain 06/16/13   [provider]  pantoprazole (PROTONIX) 40 MG tablet Take 1 tablet (40 mg total) by mouth daily. Patient not taking: Reported on 07/30/2019 09/17/18 09/17/19  Butch Penny, NP  pantoprazole (PROTONIX) 40 MG tablet Take 1 tablet (40 mg total) by mouth 2 (two) times daily before a meal. 11/24/18   Setzer, Rona Ravens, NP  polyethylene glycol powder (GLYCOLAX) 17 GM/SCOOP powder Take 17 g by mouth daily. 07/30/19  Laurine Blazer A, PA-C  ramipril (ALTACE) 10 MG capsule Take 10 mg by mouth daily.  02/05/14   [provider]  simvastatin (ZOCOR) 20 MG tablet Take 20 mg by mouth daily. 04/15/13   [provider]  tamsulosin (FLOMAX) 0.4 MG CAPS capsule Take 0.4 mg by mouth 2 (two) times daily.    [provider]  bismuth-metronidazole-tetracycline (PLYERA) [id number] MG per capsule Take 3 capsules by mouth 4 (four) times daily -  before meals and at bedtime.  09/26/11  [provider]    Allergies    Aspirin and Bayer aspirin [aspirin]  Review of Systems   Review of Systems  Constitutional: Negative for fever.  Respiratory: Negative for shortness of breath.   Cardiovascular: Positive for chest pain. Negative for leg swelling.  Gastrointestinal: Negative for abdominal pain, nausea and vomiting.  Genitourinary: Negative for dysuria.  Neurological: Positive for dizziness, light-headedness and headaches.  All other systems reviewed and are negative.   Physical Exam Updated Vital Signs BP (!) 177/84   Pulse 65   Temp 97.6 F (36.4 C)   Resp 14   Ht 1.778 m ('5\' 10"'$ )   Wt 73.5 kg   SpO2 99%   BMI 23.24 kg/m   Physical Exam Vitals and nursing note reviewed.  Constitutional:      Appearance: He is well-developed. He is not ill-appearing.  HENT:     Head: Normocephalic and atraumatic.     Mouth/Throat:     Mouth: Mucous membranes are moist.    Eyes:     Extraocular Movements: Extraocular movements intact.     Pupils: Pupils are equal, round, and reactive to light.  Cardiovascular:     Rate and Rhythm: Normal rate and regular rhythm.     Heart sounds: Normal heart sounds. No murmur.  Pulmonary:     Effort: Pulmonary effort is normal. No respiratory distress.     Breath sounds: Normal breath sounds. No wheezing.  Abdominal:     General: Bowel sounds are normal.     Palpations: Abdomen is soft.     Tenderness: There is no abdominal tenderness. There is no rebound.  Musculoskeletal:     Cervical back: Normal range of motion and neck supple.  Lymphadenopathy:     Cervical: No cervical adenopathy.  Skin:    General: Skin is warm and dry.  Neurological:     Mental Status: He is alert and oriented to person, place, and time.     Comments: Fluent speech, cranial nerves II through XII intact, 5 out of 5 strength in all 4 extremities, no dysmetria to finger-nose-finger  Psychiatric:        Mood and Affect: Mood normal.     ED Results / Procedures / Treatments   Labs (all labs ordered are listed, but only abnormal results are displayed) Labs Reviewed  CBC WITH DIFFERENTIAL/PLATELET - Abnormal; Notable for the following components:      Result Value   WBC 3.4 (*)    RBC 3.70 (*)    Hemoglobin 11.4 (*)    HCT 34.5 (*)    Neutro Abs 1.3 (*)    All other components within normal limits  BASIC METABOLIC PANEL - Abnormal; Notable for the following components:   Sodium 134 (*)    Glucose, Bld 216 (*)    All other components within normal limits  TROPONIN I (HIGH SENSITIVITY)    EKG EKG Interpretation  Date/Time:  Monday Nov 16 2019 23:32:00 EDT Ventricular Rate:  80 PR Interval:    QRS Duration: 80 QT Interval:  381 QTC Calculation: 378 R Axis:   17 Text Interpretation: Sinus rhythm Atrial premature complex Abnormal R-wave progression, early transition Minimal ST elevation, anterior leads No significant change since  last tracing Confirmed by Thayer Jew [phone]) on 11/17/2019 12:25:07 AM   Radiology DG Chest 2 View  Result Date: 11/17/2019 CLINICAL DATA:  Chest pain EXAM: CHEST - 2 VIEW COMPARISON:  None. FINDINGS: The heart size and mediastinal contours are within normal limits. Both lungs are clear. No acute osseous abnormality. IMPRESSION: No active cardiopulmonary disease. Electronically Signed   By: Prudencio Pair [name]   On: 11/17/2019 00:05   CT Head Wo Contrast  Result Date: 11/17/2019 CLINICAL DATA:  Headache, hypertension for 2 days EXAM: CT HEAD WITHOUT CONTRAST TECHNIQUE: Contiguous axial images were obtained from the base of the skull through the vertex without intravenous contrast. COMPARISON:  04/11/2014 FINDINGS: Brain: No acute infarct or hemorrhage. Lateral ventricles and midline structures are unremarkable. No acute extra-axial fluid collections. No mass effect. Vascular: No hyperdense vessel or unexpected calcification. Skull: Normal. Negative for fracture or focal lesion. Sinuses/Orbits: No acute finding. Other: None. IMPRESSION: 1. Stable head CT, no acute process. Electronically Signed   By: Randa Ngo [name]   On: 11/17/2019 00:10    Procedures Procedures (including critical care time)  Medications Ordered in ED Medications  acetaminophen (TYLENOL) tablet 650 mg (650 mg Oral Given 11/16/19 2327)    ED Course  I have reviewed the triage vital signs and the nursing notes.  Pertinent labs & imaging results that were available during my care of the patient were reviewed by me and considered in my medical decision making (see chart for details).  Clinical Course as of Nov 17 46  Tue Nov 17, 2019  0046 On recheck, patient reports improvement of headache with Tylenol.  Lab work reviewed and largely reassuring.  Doubt hypertensive urgency or emergency.  CT scan sensitive for subarachnoid hemorrhage within 6 hours of onset of symptoms.  Given improvement, doubt subarachnoid  hemorrhage or other bleed.  He was hypertensive.  Recommend follow-up closely with primary physician and continuing to take blood pressure medications as directed.   [CH]    Clinical Course User Index [CH] Balen Woolum, Barbette Hair, MD   MDM Rules/Calculators/A&P                       Patient presents with headache.  On and off since Thursday.  Tonight headache started just prior to arrival.  He did have some adjustments in blood pressure medication but cannot tell me what.  He is overall nontoxic and vital signs notable for blood pressure of 150/81 upon arrival.  He is nontoxic-appearing and neurologic exam is normal.  EKG shows no evidence of ischemia or arrhythmia.  Troponin is negative.  Given intermittent chest pain and no ongoing chest pain, feel 1 troponin at this time is adequate for screening purposes.  Chest x-ray shows no evidence of pneumothorax or pneumonia and independently reviewed by myself.  CT head obtained within 6 hours of onset of symptoms is negative.  Doubt subarachnoid hemorrhage.  Patient had improvement with Tylenol.  Clinically he is well-appearing.  Doubt acute emergent process.  We will have him follow-up closely with his primary physician.  After history, exam, and medical workup I feel the patient has been appropriately medically screened and is safe for discharge home. Pertinent diagnoses were discussed with the  patient. Patient was given return precautions.   Final Clinical Impression(s) / ED Diagnoses Final diagnoses:  Acute nonintractable headache, unspecified headache type  Essential hypertension  Atypical chest pain    Rx / DC Orders ED Discharge Orders    None       Merryl Hacker, MD 11/17/19 0050

## 2019-11-17 LAB — BASIC METABOLIC PANEL
Anion gap: 7 (ref 5–15)
BUN: 16 mg/dL (ref 8–23)
CO2: 26 mmol/L (ref 22–32)
Calcium: 9.1 mg/dL (ref 8.9–10.3)
Chloride: 101 mmol/L (ref 98–111)
Creatinine, Ser: 1.09 mg/dL (ref 0.61–1.24)
GFR calc Af Amer: >60 mL/min (ref >60)
GFR calc non Af Amer: >60 mL/min (ref >60)
Glucose, Bld: 216 mg/dL — ABNORMAL HIGH (ref 70–99)
Potassium: 4.2 mmol/L (ref 3.5–5.1)
Sodium: 134 mmol/L — ABNORMAL LOW (ref 135–145)

## 2019-11-17 LAB — TROPONIN I (HIGH SENSITIVITY): Troponin I (High Sensitivity): 7 ng/L (ref <18)

## 2019-11-17 NOTE — Discharge Instructions (Addendum)
You were seen today with concerns for headache and chest pain.  Your blood pressure was slightly elevated.  Your CT scan and work-up is all reassuring.  You improved with Tylenol.  Continue your blood pressure medications at home.  Follow-up closely with your primary doctor for continued monitoring of blood pressure and medication adjustment.  If you develop worst headache of your life, nausea, vomiting, any new or worsening symptoms you should be reevaluated.

## 2019-11-18 ENCOUNTER — Ambulatory Visit (INDEPENDENT_AMBULATORY_CARE_PROVIDER_SITE_OTHER): Payer: Medicare HMO | Admitting: Gastroenterology

## 2019-12-30 ENCOUNTER — Ambulatory Visit (INDEPENDENT_AMBULATORY_CARE_PROVIDER_SITE_OTHER): Payer: Medicare HMO | Admitting: Gastroenterology

## 2020-01-27 ENCOUNTER — Encounter (INDEPENDENT_AMBULATORY_CARE_PROVIDER_SITE_OTHER): Payer: Self-pay | Admitting: Gastroenterology

## 2020-01-27 ENCOUNTER — Encounter (INDEPENDENT_AMBULATORY_CARE_PROVIDER_SITE_OTHER): Payer: Self-pay | Admitting: *Deleted

## 2020-01-27 ENCOUNTER — Other Ambulatory Visit (INDEPENDENT_AMBULATORY_CARE_PROVIDER_SITE_OTHER): Payer: Self-pay | Admitting: *Deleted

## 2020-01-27 ENCOUNTER — Ambulatory Visit (INDEPENDENT_AMBULATORY_CARE_PROVIDER_SITE_OTHER): Payer: Medicare HMO | Admitting: Gastroenterology

## 2020-01-27 ENCOUNTER — Other Ambulatory Visit: Payer: Self-pay

## 2020-01-27 VITALS — BP 183/88 | HR 86 | Temp 98.8°F | Ht 70.0 in | Wt 165.6 lb

## 2020-01-27 DIAGNOSIS — R1310 Dysphagia, unspecified: Secondary | ICD-10-CM

## 2020-01-27 DIAGNOSIS — K219 Gastro-esophageal reflux disease without esophagitis: Secondary | ICD-10-CM

## 2020-01-27 NOTE — Patient Instructions (Addendum)
*  When you get home look if you are taking Prilosec/omeprazole or Protonix/pantoprazole - please call and let me know.   We are scheduling endoscopy for evaluation

## 2020-01-27 NOTE — Progress Notes (Signed)
Patient profile: [name] is a 79 y.o. male seen for follow-up of GERD and dysphagia.  History of Present Illness: [name] is seen today for follow up - he reports continued symptoms of dysphagia. Most notable to pills such as metformin but also can occur w/ foods such as fruit cocktail.  Substances stick in upper esophageal area Feels like pills can stick for "a while" and cause belching and indigestion. He denies any GERD symptoms currently.  He has been both omeprazole and pantoprazole on med list-he is not sure which he is taking.  No nausea/vomiting.  He is having a BM variable form every other day to 2x/day. No blood in stool. Does have some intermittent abd pain - only lasts 2-3 min per episode, doesn't relate to BMs, unsure of triggers. Takes miralax 2x/week.   Due for 2nd dose moderna COVID vaccine on 02/11/20.  He denies frequent NSAID use.  Wt Readings from Last 3 Encounters:  01/27/20 165 lb 9.6 oz (75.1 kg)  11/16/19 162 lb (73.5 kg)  11/04/19 163 lb (73.9 kg)   06/2020-#158    Last Colonoscopy:  January 2020-done for history of colon polyps-entire colon normal.  Internal hemorrhoids   Last Endoscopy: 2016-Impression: Small sliding hiatal hernia without evidence of erosive esophagitis ring or stricture formation. Antral gastritis with small whitish plaque. Biopsy taken. Pathology with chronic inactive gastritis, negative H. pylori   Past Medical History:  Past Medical History:  Diagnosis Date  . Arthritis    fingers, back  . Chronic back pain   . Diabetes mellitus Since 1995   Takes Glucovance and Onglyza daily  . Diabetes mellitus without complication ([place])   . GERD (gastroesophageal reflux disease)    takes Dexilant and Omeprazole daily  . Herpes   . History of colon polyps   . History of gastric ulcer 25+yrs ago  . Hyperlipidemia Since 1995   takes Simvastatin daily  . Hypertension Since 1995   takes Metoprolol and Ramipril daily  .  Hypertension   . Pneumonia    hx of;as a child  . Sleep apnea    doesn't use CPAP  . Urinary frequency   . Urinary urgency    takes Flomax daily    Problem List: Patient Active Problem List   Diagnosis Date Noted  . 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 Weak urinary stream 09/30/2019  . BPH (benign prostatic hyperplasia) 05/25/2019  . Dysphagia 11/24/2018  . History of colonic polyps 04/24/2018  . 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 GERD (gastroesophageal reflux disease) 09/11/2011  . High cholesterol 09/11/2011  . Bronchitis 09/11/2011  . DM 02/13/2010  . ESSENTIAL HYPERTENSION, BENIGN 02/13/2010  . PRECORDIAL PAIN 02/13/2010    Past Surgical History: Past Surgical History:  Procedure Laterality Date  . BACK SURGERY  1962   Lumbar  . BACK SURGERY    . bilateral cataract surgery    . CARDIAC CATHETERIZATION  02/16/10  . CERVICAL SPINE SURGERY    . COLONOSCOPY    . COLONOSCOPY N/A 04/23/2013   Procedure: COLONOSCOPY;  Surgeon: Rogene Houston, MD;  [facility];  Service: Endoscopy;  Laterality: N/A;  1030  . COLONOSCOPY N/A 07/24/2018   Procedure: COLONOSCOPY;  Surgeon: Rogene Houston, MD;  [facility];  Service: Endoscopy;  Laterality: N/A;  8:30  . ESOPHAGOGASTRODUODENOSCOPY N/A 12/24/2014   Procedure: ESOPHAGOGASTRODUODENOSCOPY (EGD);  Surgeon: Rogene Houston, MD;  [facility];  Service: Endoscopy;  Laterality: N/A;  2:45  . ESOPHAGOGASTRODUODENOSCOPY (EGD) WITH ESOPHAGEAL DILATION    . HAND SURGERY     Right  . LUMBAR LAMINECTOMY/DECOMPRESSION MICRODISCECTOMY N/A 08/29/2012   Procedure: LUMBAR LAMINECTOMY/DECOMPRESSION MICRODISCECTOMY 1 LEVEL;  Surgeon: Floyce Stakes, MD;  Location: [facility];  Service: Neurosurgery;  Laterality: N/A;  Lumbar four-five laminectomies  . NECK SURGERY    . PENILE PROSTHESIS IMPLANT    . ROTATOR CUFF REPAIR     Right  . SHOULDER SURGERY    . TRANSURETHRAL RESECTION OF  PROSTATE N/A 05/25/2019   Procedure: TRANSURETHRAL RESECTION OF THE PROSTATE (TURP);  Surgeon: Cleon Gustin, MD;  Location: [facility];  Service: Urology;  Laterality: N/A;  . WRIST SURGERY     Left  . WRIST SURGERY      Allergies: Allergies  Allergen Reactions  . Aspirin Other (See Comments)    Higher dosage upsets stomach  . Bayer Aspirin [Aspirin] Other (See Comments)    Stomach "flares up"      Home Medications:  Current Outpatient Medications:  .  acyclovir (ZOVIRAX) 200 MG capsule, Take 200 mg by mouth as needed. , Disp: , Rfl:  .  amLODipine (NORVASC) 10 MG tablet, Take 10 mg by mouth daily., Disp: , Rfl:  .  Blood Glucose Monitoring Suppl (ONETOUCH VERIO FLEX SYSTEM) w/Device KIT, USE TO TEST TWICE DAILY, Disp: , Rfl:  .  glyBURIDE-metformin (GLUCOVANCE) 5-500 MG per tablet, Take 2 tablets by mouth 2 (two) times daily with a meal. , Disp: , Rfl:  .  labetalol (NORMODYNE) 200 MG tablet, Take 200 mg by mouth 2 (two) times daily., Disp: , Rfl:  .  Lancets (ONETOUCH DELICA PLUS [id number]) MISC, USE TO TEST TWICE DAILY, Disp: , Rfl:  .  metoprolol succinate (TOPROL-XL) 50 MG 24 hr tablet, Take 50 mg by mouth daily. Take with or immediately following a meal., Disp: , Rfl:  .  naproxen (NAPROSYN) 500 MG tablet, Take 500 mg by mouth 2 (two) times daily with a meal., Disp: , Rfl:  .  omeprazole (PRILOSEC) 40 MG capsule, Take 40 mg by mouth daily., Disp: , Rfl:  .  ONETOUCH VERIO test strip, USE TO TEST TWICE DAILY, Disp: , Rfl:  .  oxyCODONE (OXY IR/ROXICODONE) 5 MG immediate release tablet, Take 5 mg by mouth 3 (three) times daily. For pain, Disp: , Rfl:  .  pantoprazole (PROTONIX) 40 MG tablet, Take 1 tablet (40 mg total) by mouth 2 (two) times daily before a meal., Disp: 180 tablet, Rfl: 3 .  polyethylene glycol powder (GLYCOLAX) 17 GM/SCOOP powder, Take 17 g by mouth daily., Disp: 850 g, Rfl: 1 .  ramipril (ALTACE) 10 MG capsule, Take 10 mg by mouth daily. , Disp: , Rfl:  .   simvastatin (ZOCOR) 20 MG tablet, Take 20 mg by mouth daily., Disp: , Rfl:  .  tamsulosin (FLOMAX) 0.4 MG CAPS capsule, Take 0.4 mg by mouth 2 (two) times daily., Disp: , Rfl:  .  cetirizine (ZYRTEC) 10 MG tablet, Take 10 mg by mouth daily., Disp: , Rfl:  .  pantoprazole (PROTONIX) 40 MG tablet, Take 1 tablet (40 mg total) by mouth daily. (Patient not taking: Reported on 07/30/2019), Disp: 30 tablet, Rfl: 11  Current Facility-Administered Medications:  .  ciprofloxacin (CIPRO) tablet 500 mg, 500 mg, Oral, Once, McKenzie, Candee Furbish, MD   Family History: family history includes Diabetes in his mother; Heart attack in his paternal uncle; Heart attack (age of onset: 27)  in his brother; Heart attack (age of onset: 55) in his father; Hypertension in his father.    Social History:   reports that he has never smoked. He has never used smokeless tobacco. He reports previous alcohol use. He reports current drug use. Frequency: 3.00 times per week. Drug: Marijuana.   Review of Systems: Constitutional: Denies weight loss/weight gain  Eyes: No changes in vision. ENT: No oral lesions, sore throat.  GI: see HPI.  Heme/Lymph: No easy bruising.  CV: No chest pain.  GU: No hematuria.  Integumentary: No rashes.  Neuro: No headaches.  Psych: No depression/anxiety.  Endocrine: No heat/cold intolerance.  Allergic/Immunologic: No urticaria.  Resp: No cough, SOB.  Musculoskeletal: No joint swelling.    Physical Examination: BP (!) 183/88 (BP Location: Right Arm, Patient Position: Sitting, Cuff Size: Large) Comment: patient has not taken his BP medication  Pulse 86   Temp 98.8 F (37.1 C) (Oral)   Ht _0  (1.778 m)   Wt 165 lb 9.6 oz (75.1 kg)   BMI 23.76 kg/m  Gen: NAD, alert and oriented x 4 HEENT: PEERLA, EOMI, Neck: supple, no JVD Chest: CTA bilaterally, no wheezes, crackles, or other adventitious sounds CV: RRR, no m/g/c/r Abd: soft, NT, ND, +BS in all four quadrants; no HSM, guarding,  ridigity, or rebound tenderness Ext: no edema, well perfused with 2+ pulses, Skin: no rash or lesions noted on observed skin Lymph: no noted LAD  Data Reviewed:  Labs reviewed-hemoglobin 9.5, MCV 92, BMP normal except glucose 216  11/2018-IMPRESSION: 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 No other abnormality seen in the esophagus.   Assessment/Plan: Mr. Plante is a 79 y.o. male for follow-up.   1.  Dysphagia-this is been a chronic issue.  He has had a barium swallow as above.  Given symptoms are persisting and frequent we will proceed endoscopy with possible dilation.  2.  GERD-he has Protonix and Prilosec on his medication list.  I have asked him to call me when he gets when he is actually taking. Need to ensure he is not on duplicate PPI therapy. Currently not having any GERD symptoms.  3.  Chronic constipation-with MiraLAX as needed. No lower GI alarm symptoms.  Patient denies CP, SOB, and use of blood thinners. I discussed the risks and benefits of procedure including bleeding, perforation, infection, missed lesions, medication reactions and possible hospitalization or surgery if complications. All questions answered.  He denies any prior issues with sedation.  *He has not taken his BP medication yet today-reviewed BP elevated and he should take this.   Wilkes was seen today for follow-up.  Diagnoses and all orders for this visit:  Dysphagia, unspecified type  Chronic GERD       I personally performed the service, non-incident to. (WP)  Laurine Blazer, [facility]

## 2020-02-03 ENCOUNTER — Ambulatory Visit (INDEPENDENT_AMBULATORY_CARE_PROVIDER_SITE_OTHER): Payer: Medicare HMO | Admitting: Urology

## 2020-02-03 ENCOUNTER — Other Ambulatory Visit: Payer: Self-pay

## 2020-02-03 ENCOUNTER — Encounter: Payer: Self-pay | Admitting: Urology

## 2020-02-03 VITALS — BP 154/73 | HR 90 | Temp 97.7°F | Ht 70.0 in | Wt 165.0 lb

## 2020-02-03 DIAGNOSIS — R338 Other retention of urine: Secondary | ICD-10-CM | POA: Diagnosis not present

## 2020-02-03 DIAGNOSIS — R339 Retention of urine, unspecified: Secondary | ICD-10-CM | POA: Diagnosis not present

## 2020-02-03 DIAGNOSIS — N401 Enlarged prostate with lower urinary tract symptoms: Secondary | ICD-10-CM

## 2020-02-03 DIAGNOSIS — N35011 Post-traumatic bulbous urethral stricture: Secondary | ICD-10-CM | POA: Diagnosis not present

## 2020-02-03 LAB — URINALYSIS, ROUTINE W REFLEX MICROSCOPIC
Bilirubin, UA: NEGATIVE
Glucose, UA: NEGATIVE
Ketones, UA: NEGATIVE
Leukocytes,UA: NEGATIVE
Nitrite, UA: NEGATIVE
Protein,UA: NEGATIVE
Specific Gravity, UA: 1.015 (ref 1.005–1.030)
Urobilinogen, Ur: 0.2 mg/dL (ref 0.2–1.0)
pH, UA: 5 (ref 5.0–7.5)

## 2020-02-03 LAB — MICROSCOPIC EXAMINATION
Bacteria, UA: NONE SEEN
Renal Epithel, UA: NONE SEEN /hpf
WBC, UA: NONE SEEN /hpf (ref 0–5)

## 2020-02-03 LAB — BLADDER SCAN AMB NON-IMAGING: Scan Result: 212.5

## 2020-02-03 NOTE — Progress Notes (Signed)
02/03/2020 11:17 AM   [name] 01/15/1941 [id number]  Referring provider: Lucia Gaskins, MD [address],  [place] [address]  BPH and hx of urethral stricture  HPI: [name] is a 79yo here for followup for BPH and urethral stricture. He does CIC once a weak to dilate his stricture. He has stable midl LUTS after TURP. He has a good stream, nocturia 1-3x depending on fluid intake, urinary frequency every 3-4 hours. He has occasional urgency but no urge incontinence. No gross hematuria. No dysuria. No UTIs   PMH: Past Medical History:  Diagnosis Date  . Arthritis    fingers, back  . Chronic back pain   . Diabetes mellitus Since 1995   Takes Glucovance and Onglyza daily  . Diabetes mellitus without complication ([place])   . GERD (gastroesophageal reflux disease)    takes Dexilant and Omeprazole daily  . Herpes   . History of colon polyps   . History of gastric ulcer 25+yrs ago  . Hyperlipidemia Since 1995   takes Simvastatin daily  . Hypertension Since 1995   takes Metoprolol and Ramipril daily  . Hypertension   . Pneumonia    hx of;as a child  . Sleep apnea    doesn't use CPAP  . Urinary frequency   . Urinary urgency    takes Flomax daily    Surgical History: Past Surgical History:  Procedure Laterality Date  . BACK SURGERY  1962   Lumbar  . BACK SURGERY    . bilateral cataract surgery    . CARDIAC CATHETERIZATION  02/16/10  . CERVICAL SPINE SURGERY    . COLONOSCOPY    . COLONOSCOPY N/A 04/23/2013   Procedure: COLONOSCOPY;  Surgeon: Barry Houston, MD;  [facility]: [facility];  Service: Endoscopy;  Laterality: N/A;  1030  . COLONOSCOPY N/A 07/24/2018   Procedure: COLONOSCOPY;  Surgeon: Barry Houston, MD;  Location: [facility];  Service: Endoscopy;  Laterality: N/A;  8:30  . ESOPHAGOGASTRODUODENOSCOPY N/A 12/24/2014   Procedure: ESOPHAGOGASTRODUODENOSCOPY (EGD);  Surgeon: Barry Houston, MD;  [facility]: [facility];  Service:  Endoscopy;  Laterality: N/A;  2:45  . ESOPHAGOGASTRODUODENOSCOPY (EGD) WITH ESOPHAGEAL DILATION    . HAND SURGERY     Right  . LUMBAR LAMINECTOMY/DECOMPRESSION MICRODISCECTOMY N/A 08/29/2012   Procedure: LUMBAR LAMINECTOMY/DECOMPRESSION MICRODISCECTOMY 1 LEVEL;  Surgeon: Barry Stakes, MD;  Location: [facility];  Service: Neurosurgery;  Laterality: N/A;  Lumbar four-five laminectomies  . NECK SURGERY    . PENILE PROSTHESIS IMPLANT    . ROTATOR CUFF REPAIR     Right  . SHOULDER SURGERY    . TRANSURETHRAL RESECTION OF PROSTATE N/A 05/25/2019   Procedure: TRANSURETHRAL RESECTION OF THE PROSTATE (TURP);  Surgeon: Barry Gustin, MD;  Location: [facility];  Service: Urology;  Laterality: N/A;  . WRIST SURGERY     Left  . WRIST SURGERY      Home Medications:  Allergies as of 02/03/2020      Reactions   Aspirin Other (See Comments)   Higher dosage upsets stomach   Bayer Aspirin [aspirin] Other (See Comments)   Stomach "flares up"      Medication List       Accurate as of February 03, 2020 11:17 AM. If you have any questions, ask your nurse or doctor.        acyclovir 200 MG capsule Commonly known as: ZOVIRAX Take 200 mg by mouth as needed.   amLODipine 10  MG tablet Commonly known as: NORVASC Take 10 mg by mouth daily.   cetirizine 10 MG tablet Commonly known as: ZYRTEC Take 10 mg by mouth daily.   glyBURIDE-metformin 5-500 MG tablet Commonly known as: GLUCOVANCE Take 2 tablets by mouth 2 (two) times daily with a meal.   labetalol 200 MG tablet Commonly known as: NORMODYNE Take 200 mg by mouth 2 (two) times daily.   metoprolol succinate 50 MG 24 hr tablet Commonly known as: TOPROL-XL Take 50 mg by mouth daily. Take with or immediately following a meal.   naproxen 500 MG tablet Commonly known as: NAPROSYN Take 500 mg by mouth 2 (two) times daily with a meal.   omeprazole 40 MG capsule Commonly known as: PRILOSEC Take 40 mg by mouth daily.   OneTouch Delica  Plus [id number] Misc USE TO TEST TWICE DAILY   OneTouch Verio Flex System w/Device Kit USE TO TEST TWICE DAILY   OneTouch Verio test strip Generic drug: glucose blood USE TO TEST TWICE DAILY   oxyCODONE 5 MG immediate release tablet Commonly known as: Oxy IR/ROXICODONE Take 5 mg by mouth 3 (three) times daily. For pain   pantoprazole 40 MG tablet Commonly known as: Protonix Take 1 tablet (40 mg total) by mouth daily.   pantoprazole 40 MG tablet Commonly known as: PROTONIX Take 1 tablet (40 mg total) by mouth 2 (two) times daily before a meal.   polyethylene glycol powder 17 GM/SCOOP powder Commonly known as: GlycoLax Take 17 g by mouth daily.   ramipril 10 MG capsule Commonly known as: ALTACE Take 10 mg by mouth daily.   simvastatin 20 MG tablet Commonly known as: ZOCOR Take 20 mg by mouth daily.   tamsulosin 0.4 MG Caps capsule Commonly known as: FLOMAX Take 0.4 mg by mouth 2 (two) times daily.       Allergies:  Allergies  Allergen Reactions  . Aspirin Other (See Comments)    Higher dosage upsets stomach  . Bayer Aspirin [Aspirin] Other (See Comments)    Stomach "flares up"    Family History: Family History  Problem Relation Age of Onset  . Heart attack Father 65       MI  . Hypertension Father   . Heart attack Brother 39       MI  . Diabetes Mother   . Heart attack Paternal Uncle     Social History:  reports that he has never smoked. He has never used smokeless tobacco. He reports previous alcohol use. He reports current drug use. Frequency: 3.00 times per week. Drug: Marijuana.  ROS: All other review of systems were reviewed and are negative except what is noted above in HPI  Physical Exam: BP (!) 154/73   Pulse 90   Temp 97.7 F (36.5 C)   Ht _0  (1.778 m)   Wt 165 lb (74.8 kg)   BMI 23.68 kg/m   Constitutional:  Alert and oriented, No acute distress. HEENT: [place] AT, moist mucus membranes.  Trachea midline, no masses. Cardiovascular:  No clubbing, cyanosis, or edema. Respiratory: Normal respiratory effort, no increased work of breathing. GI: Abdomen is soft, nontender, nondistended, no abdominal masses GU: No CVA tenderness.  Lymph: No cervical or inguinal lymphadenopathy. Skin: No rashes, bruises or suspicious lesions. Neurologic: Grossly intact, no focal deficits, moving all 4 extremities. Psychiatric: Normal mood and affect.  Laboratory Data: Lab Results  Component Value Date   WBC 3.4 (L) 11/16/2019   HGB 11.4 (L) 11/16/2019   HCT  34.5 (L) 11/16/2019   MCV 93.2 11/16/2019   PLT 152 11/16/2019    Lab Results  Component Value Date   CREATININE 1.09 11/16/2019    No results found for: PSA  No results found for: TESTOSTERONE  Lab Results  Component Value Date   HGBA1C 7.2 (H) 05/22/2019    Urinalysis    Component Value Date/Time   COLORURINE YELLOW 06/19/2019 0827   APPEARANCEUR Clear 02/03/2020 1019   LABSPEC 1.014 06/19/2019 0827   PHURINE 5.0 06/19/2019 0827   GLUCOSEU Negative 02/03/2020 1019   HGBUR LARGE (A) 06/19/2019 0827   BILIRUBINUR Negative 02/03/2020 1019   KETONESUR NEGATIVE 06/19/2019 0827   PROTEINUR Negative 02/03/2020 1019   PROTEINUR 100 (A) 06/19/2019 0827   UROBILINOGEN 0.2 11/04/2019 1451   UROBILINOGEN 0.2 05/15/2014 0122   NITRITE Negative 02/03/2020 1019   NITRITE NEGATIVE 06/19/2019 0827   LEUKOCYTESUR Negative 02/03/2020 1019   LEUKOCYTESUR LARGE (A) 06/19/2019 0827    Lab Results  Component Value Date   LABMICR See below: 02/03/2020   WBCUA None seen 02/03/2020   LABEPIT 0-10 02/03/2020   BACTERIA None seen 02/03/2020    Pertinent Imaging:  No results found for this or any previous visit.  No results found for this or any previous visit.  No results found for this or any previous visit.  No results found for this or any previous visit.  No results found for this or any previous visit.  No results found for this or any previous visit.  No  results found for this or any previous visit.  Results for orders placed during the hospital encounter of 09/20/17  CT Renal Stone Study  Narrative CLINICAL DATA:  No new urinary tract stone. Low back pain for 2 weeks. Urinary retention and dysuria.  EXAM: CT ABDOMEN AND PELVIS WITHOUT CONTRAST  TECHNIQUE: Multidetector CT imaging of the abdomen and pelvis was performed following the standard protocol without IV contrast.  COMPARISON:  CT of the abdomen and pelvis 01/25/2012. Lumbar spine radiographs 04/24/2017.  FINDINGS: Lower chest: Excellent recall bronchiectasis is again noted at the lung bases bilaterally without superimposed airspace disease. The heart size is normal. No significant pleural or pericardial effusion is present.  Hepatobiliary: No focal liver abnormality is seen. No gallstones, gallbladder wall thickening, or biliary dilatation.  Pancreas: Unremarkable. No pancreatic ductal dilatation or surrounding inflammatory changes.  Spleen: Calcification along the lateral margin of the spleen is stable. No focal lesions are evident.  Adrenals/Urinary Tract: Adrenal glands are normal bilaterally. No stone or mass lesion is present. There is no obstruction. The ureters are within normal limits bilaterally. The urinary bladder is within normal limits.  Stomach/Bowel: The stomach and duodenum are within normal limits. Small bowel is unremarkable. The terminal ileum is within normal limits. The appendix is visualized and normal.  Vascular/Lymphatic: Atherosclerotic calcifications are present without aneurysm.  Reproductive: A penile prosthesis is noted. The prostate gland is somewhat prominent and indents the inferior margin of the urinary bladder. It measures 4 cm in transverse diameter.  Other: No abdominal wall hernia or abnormality. No abdominopelvic ascites.  Musculoskeletal: Progressive endplate degenerative changes and sclerosis are present at L3-4,  L4-5, and L5-S1. A transitional S1 segment is again noted. Foraminal narrowing is evident bilaterally at L4-5 and L5-S1.  IMPRESSION: 1. No urinary tract obstruction or stone. 2. Prominent prostate gland indents the inferior border of the urinary bladder. 3. progressive degenerative changes in the lower lumbar spine. 4. Penile prosthesis.   Electronically  Signed By: San Morelle [name] On: 09/20/2017 16:28   Assessment & Plan:    1. Benign prostatic hyperplasia with urinary retention -Patient stopped flomax after TURP. Patient doing well.  - BLADDER SCAN AMB NON-IMAGING - Urinalysis, Routine w reflex microscopic  2. Urinary retention -resolved  3. Post-traumatic bulbous urethral stricture CIC every 1-2 weeks   No follow-ups on file.  Nicolette Bang, MD  [facility] [place]

## 2020-02-03 NOTE — Progress Notes (Signed)
Urological Symptom Review  Patient is experiencing the following symptoms: Hard to postpone urination Get up at night to urinate   Review of Systems  Gastrointestinal (upper)  : Indigestion/heartburn  Gastrointestinal (lower) : Negative for lower GI symptoms  Constitutional : Negative for symptoms  Skin: Negative for skin symptoms  Eyes: Negative for eye symptoms  Ear/Nose/Throat : Negative for Ear/Nose/Throat symptoms  Hematologic/Lymphatic: Negative for Hematologic/Lymphatic symptoms  Cardiovascular : Negative for cardiovascular symptoms  Respiratory : Negative for respiratory symptoms  Endocrine: Negative for endocrine symptoms  Musculoskeletal: Back pain Joint pain  Neurological: Dizziness  Psychologic: Negative for psychiatric symptoms

## 2020-02-03 NOTE — Patient Instructions (Signed)
Urethral Stricture  Urethral stricture is narrowing of the tube (urethra) that carries urine from the bladder out of the body. The urethra can become narrow due to scar tissue from an injury or infection. This can make it difficult to pass urine. In women, the urethra opens above the vaginal opening. In men, the urethra opens at the tip of the penis, and the urethra is much longer than it is in women. Because of the length of the male urethra, urethral stricture is much more common in men. This condition is treated with surgery. What are the causes? In both men and women, common causes of urethral stricture include:  Urinary tract infection (UTI).  Sexually transmitted infection (STI).  Use of a tube placed into the urethra to drain urine from the bladder (urinary catheter).  Urinary tract surgery. In men, common causes of urethral stricture include:  A severe injury to the pelvis.  Prostate surgery.  Injury to the penis. In many cases, the cause of urethral stricture is not known. What increases the risk? You are more likely to develop this condition if you:  Are male. Men who have had prostate surgery are at risk of developing this condition.  Use a urinary catheter.  Have had urinary tract surgery. What are the signs or symptoms? The main symptom of this condition is difficulty passing urine. This may cause decreased urine flow, dribbling, or spraying of urine. Other symptom of this condition may include:  Frequent UTIs.  Blood in the urine.  Pain when urinating.  Swelling of the penis in men.  Inability to pass urine (urinary obstruction). How is this diagnosed? This condition may be diagnosed based on:  Your medical history and a physical exam.  Urine tests to check for infection or bleeding.  X-rays.  Ultrasound.  Retrograde urethrogram. This is a type of test in which dye is injected into the urethra and then an X-ray is taken.  Urethroscopy. This is when  a thin tube with a light and camera on the end (urethroscope) is used to look at the urethra. How is this treated? This condition is treated with surgery. The type of surgery that you have depends on the severity of your condition. You may have:  Urethral dilation. In this procedure, the narrow part of the urethra is stretched open (dilated) with dilating instruments or a small balloon.  Urethrotomy. In this procedure, a urethroscope is placed into the urethra, and the narrow part of the urethra is cut open with a surgical blade inserted through the urethroscope.  Open surgery. In this procedure, an incision is made in the urethra, the narrow part is removed, and the urethra is reconstructed. Follow these instructions at home:   Take over-the-counter and prescription medicines only as told by your health care provider.  If you were prescribed an antibiotic medicine, take it as told by your health care provider. Do not stop taking the antibiotic even if you start to feel better.  Drink enough fluid to keep your urine pale yellow.  Keep all follow-up visits as told by your health care provider. This is important. Contact a health care provider if:  You have signs of a urinary tract infection, such as: ? Frequent urination or passing small amounts of urine frequently. ? Needing to urinate urgently. ? Pain or burning with urination. ? Urine that smells bad or unusual. ? Cloudy urine. ? Pain in the lower abdomen or back. ? Trouble urinating. ? Blood in the urine. ?   Vomiting or being less hungry than normal. ? Diarrhea or abdominal pain. ? Vaginal discharge, if you are male.  Your symptoms are getting worse instead of better. Get help right away if:  You cannot pass urine.  You have a fever.  You have swelling, bruising, or discoloration of your genital area. This includes the penis, scrotum, and inner thighs for men, and the outer genital organs (vulva) and inner thighs for  women.  You develop swelling in your legs.  You have difficulty breathing. Summary  Urethral stricture is narrowing of the tube (urethra) that carries urine from the bladder out of the body. The urethra can become narrow due to scar tissue from an injury or infection.  This condition can make it difficult to pass urine.  This condition is treated with surgery. The type of surgery that you have depends on the severity of your condition.  Contact a health care provider if your symptoms get worse or you have signs of a urinary tract infection. This information is not intended to replace advice given to you by your health care provider. Make sure you discuss any questions you have with your health care provider. Document Revised: 02/05/2018 Document Reviewed: 02/05/2018 Elsevier Patient Education  2020 [employer]  

## 2020-02-08 ENCOUNTER — Other Ambulatory Visit (HOSPITAL_COMMUNITY)
Admission: RE | Admit: 2020-02-08 | Discharge: 2020-02-08 | Disposition: A | Discharge status: Home / Self Care | Payer: Medicare HMO | Source: Ambulatory Visit | Attending: Internal Medicine | Admitting: Internal Medicine

## 2020-02-08 ENCOUNTER — Encounter (HOSPITAL_COMMUNITY)
Admission: RE | Admit: 2020-02-08 | Discharge: 2020-02-08 | Disposition: A | Discharge status: Home / Self Care | Payer: Medicare HMO | Source: Ambulatory Visit | Attending: Internal Medicine | Admitting: Internal Medicine

## 2020-02-08 ENCOUNTER — Other Ambulatory Visit: Payer: Self-pay

## 2020-02-08 DIAGNOSIS — Z01812 Encounter for preprocedural laboratory examination: Secondary | ICD-10-CM | POA: Insufficient documentation

## 2020-02-08 DIAGNOSIS — R1310 Dysphagia, unspecified: Secondary | ICD-10-CM | POA: Insufficient documentation

## 2020-02-08 DIAGNOSIS — Z20822 Contact with and (suspected) exposure to covid-19: Secondary | ICD-10-CM | POA: Diagnosis not present

## 2020-02-08 LAB — SARS CORONAVIRUS 2 (TAT 6-24 HRS): SARS Coronavirus 2: NEGATIVE

## 2020-02-08 NOTE — Patient Instructions (Signed)
[name]  02/08/2020     @PREFPERIOPPHARMACY @   Your procedure is scheduled on  02/10/2020.  Report to [facility] at  1320  P.M.  Call this number if you have problems the morning of surgery:  [phone]   Remember:  Follow the diet and prep instructions given to to you by Dr Olevia Perches office.                         Take these medicines the morning of surgery with A SIP OF WATER  Amlodipine, labetolol, zyrtec, prilosec, oxycodone(if needed).    Do not wear jewelry, make-up or nail polish.  Do not wear lotions, powders, or perfumes. Please wear deodorant and brush your teeth.  Do not shave 48 hours prior to surgery.  Men may shave face and neck.  Do not bring valuables to the hospital.  [facility] is not responsible for any belongings or valuables.  Contacts, dentures or bridgework may not be worn into surgery.  Leave your suitcase in the car.  After surgery it may be brought to your room.  For patients admitted to the hospital, discharge time will be determined by your treatment team.  Patients discharged the day of surgery will not be allowed to drive home.   Name and phone number of your driver:   family Special instructions:  DO NOT smoke the morning of your procedure.  Please read over the following fact sheets that you were given. Anesthesia Post-op Instructions and Care and Recovery After Surgery       Upper Endoscopy, Adult, Care After This sheet gives you information about how to care for yourself after your procedure. Your health care provider may also give you more specific instructions. If you have problems or questions, contact your health care provider. What can I expect after the procedure? After the procedure, it is common to have:  A sore throat.  Mild stomach pain or discomfort.  Bloating.  Nausea. Follow these instructions at home:   Follow instructions from your health care provider about what to eat or drink after your  procedure.  Return to your normal activities as told by your health care provider. Ask your health care provider what activities are safe for you.  Take over-the-counter and prescription medicines only as told by your health care provider.  Do not drive for 24 hours if you were given a sedative during your procedure.  Keep all follow-up visits as told by your health care provider. This is important. Contact a health care provider if you have:  A sore throat that lasts longer than one day.  Trouble swallowing. Get help right away if:  You vomit blood or your vomit looks like coffee grounds.  You have: ? A fever. ? Bloody, black, or tarry stools. ? A severe sore throat or you cannot swallow. ? Difficulty breathing. ? Severe pain in your chest or abdomen. Summary  After the procedure, it is common to have a sore throat, mild stomach discomfort, bloating, and nausea.  Do not drive for 24 hours if you were given a sedative during the procedure.  Follow instructions from your health care provider about what to eat or drink after your procedure.  Return to your normal activities as told by your health care provider. This information is not intended to replace advice given to you by your health care provider. Make sure you discuss any questions  you have with your health care provider. Document Revised: 12/17/2017 Document Reviewed: 11/25/2017 Elsevier Patient Education  [place].  Esophageal Dilatation Esophageal dilatation, also called esophageal dilation, is a procedure to widen or open (dilate) a blocked or narrowed part of the esophagus. The esophagus is the part of the body that moves food and liquid from the mouth to the stomach. You may need this procedure if:  You have a buildup of scar tissue in your esophagus that makes it difficult, painful, or impossible to swallow. This can be caused by gastroesophageal reflux disease (GERD).  You have cancer of the  esophagus.  There is a problem with how food moves through your esophagus. In some cases, you may need this procedure repeated at a later time to dilate the esophagus gradually. Tell a health care provider about:  Any allergies you have.  All medicines you are taking, including vitamins, herbs, eye drops, creams, and over-the-counter medicines.  Any problems you or family members have had with anesthetic medicines.  Any blood disorders you have.  Any surgeries you have had.  Any medical conditions you have.  Any antibiotic medicines you are required to take before dental procedures.  Whether you are pregnant or may be pregnant. What are the risks? Generally, this is a safe procedure. However, problems may occur, including:  Bleeding due to a tear in the lining of the esophagus.  A hole (perforation) in the esophagus. What happens before the procedure?  Follow instructions from your health care provider about eating or drinking restrictions.  Ask your health care provider about changing or stopping your regular medicines. This is especially important if you are taking diabetes medicines or blood thinners.  Plan to have someone take you home from the hospital or clinic.  Plan to have a responsible adult care for you for at least 24 hours after you leave the hospital or clinic. This is important. What happens during the procedure?  You may be given a medicine to help you relax (sedative).  A numbing medicine may be sprayed into the back of your throat, or you may gargle the medicine.  Your health care provider may perform the dilatation using various surgical instruments, such as: ? Simple dilators. This instrument is carefully placed in the esophagus to stretch it. ? Guided wire bougies. This involves using an endoscope to insert a wire into the esophagus. A dilator is passed over this wire to enlarge the esophagus. Then the wire is removed. ? Balloon dilators. An endoscope  with a small balloon at the end is inserted into the esophagus. The balloon is inflated to stretch the esophagus and open it up. The procedure may vary among health care providers and hospitals. What happens after the procedure?  Your blood pressure, heart rate, breathing rate, and blood oxygen level will be monitored until the medicines you were given have worn off.  Your throat may feel slightly sore and numb. This will improve slowly over time.  You will not be allowed to eat or drink until your throat is no longer numb.  When you are able to drink, urinate, and sit on the edge of the bed without nausea or dizziness, you may be able to return home. Follow these instructions at home:  Take over-the-counter and prescription medicines only as told by your health care provider.  Do not drive for 24 hours if you were given a sedative during your procedure.  You should have a responsible adult with you for  24 hours after the procedure.  Follow instructions from your health care provider about any eating or drinking restrictions.  Do not use any products that contain nicotine or tobacco, such as cigarettes and e-cigarettes. If you need help quitting, ask your health care provider.  Keep all follow-up visits as told by your health care provider. This is important. Get help right away if you:  Have a fever.  Have chest pain.  Have pain that is not relieved by medication.  Have trouble breathing.  Have trouble swallowing.  Vomit blood. Summary  Esophageal dilatation, also called esophageal dilation, is a procedure to widen or open (dilate) a blocked or narrowed part of the esophagus.  Plan to have someone take you home from the hospital or clinic.  For this procedure, a numbing medicine may be sprayed into the back of your throat, or you may gargle the medicine.  Do not drive for 24 hours if you were given a sedative during your procedure. This information is not intended to  replace advice given to you by your health care provider. Make sure you discuss any questions you have with your health care provider. Document Revised: 04/22/2019 Document Reviewed: 04/30/2017 Elsevier Patient Education  2020 [place] After These instructions provide you with information about caring for yourself after your procedure. Your health care provider may also give you more specific instructions. Your treatment has been planned according to current medical practices, but problems sometimes occur. Call your health care provider if you have any problems or questions after your procedure. What can I expect after the procedure? After your procedure, you may:  Feel sleepy for several hours.  Feel clumsy and have poor balance for several hours.  Feel forgetful about what happened after the procedure.  Have poor judgment for several hours.  Feel nauseous or vomit.  Have a sore throat if you had a breathing tube during the procedure. Follow these instructions at home: For at least 24 hours after the procedure:      Have a responsible adult stay with you. It is important to have someone help care for you until you are awake and alert.  Rest as needed.  Do not: ? Participate in activities in which you could fall or become injured. ? Drive. ? Use heavy machinery. ? Drink alcohol. ? Take sleeping pills or medicines that cause drowsiness. ? Make important decisions or sign legal documents. ? Take care of children on your own. Eating and drinking  Follow the diet that is recommended by your health care provider.  If you vomit, drink water, juice, or soup when you can drink without vomiting.  Make sure you have little or no nausea before eating solid foods. General instructions  Take over-the-counter and prescription medicines only as told by your health care provider.  If you have sleep apnea, surgery and certain medicines can increase  your risk for breathing problems. Follow instructions from your health care provider about wearing your sleep device: ? Anytime you are sleeping, including during daytime naps. ? While taking prescription pain medicines, sleeping medicines, or medicines that make you drowsy.  If you smoke, do not smoke without supervision.  Keep all follow-up visits as told by your health care provider. This is important. Contact a health care provider if:  You keep feeling nauseous or you keep vomiting.  You feel light-headed.  You develop a rash.  You have a fever. Get help right away if:  You have  trouble breathing. Summary  For several hours after your procedure, you may feel sleepy and have poor judgment.  Have a responsible adult stay with you for at least 24 hours or until you are awake and alert. This information is not intended to replace advice given to you by your health care provider. Make sure you discuss any questions you have with your health care provider. Document Revised: 09/23/2017 Document Reviewed: 10/16/2015 Elsevier Patient Education  [place].

## 2020-02-10 ENCOUNTER — Encounter (HOSPITAL_COMMUNITY)
Admission: RE | Disposition: A | Discharge status: Home / Self Care | Payer: Self-pay | Source: Home / Self Care | Attending: Internal Medicine

## 2020-02-10 ENCOUNTER — Ambulatory Visit (HOSPITAL_COMMUNITY): Payer: Medicare HMO | Admitting: Anesthesiology

## 2020-02-10 ENCOUNTER — Ambulatory Visit (HOSPITAL_COMMUNITY)
Admission: RE | Admit: 2020-02-10 | Discharge: 2020-02-10 | Disposition: A | Discharge status: Home / Self Care | Payer: Medicare HMO | Attending: Internal Medicine | Admitting: Internal Medicine

## 2020-02-10 DIAGNOSIS — Z79899 Other long term (current) drug therapy: Secondary | ICD-10-CM | POA: Insufficient documentation

## 2020-02-10 DIAGNOSIS — E119 Type 2 diabetes mellitus without complications: Secondary | ICD-10-CM | POA: Insufficient documentation

## 2020-02-10 DIAGNOSIS — Z8711 Personal history of peptic ulcer disease: Secondary | ICD-10-CM | POA: Insufficient documentation

## 2020-02-10 DIAGNOSIS — E785 Hyperlipidemia, unspecified: Secondary | ICD-10-CM | POA: Diagnosis not present

## 2020-02-10 DIAGNOSIS — R1310 Dysphagia, unspecified: Secondary | ICD-10-CM

## 2020-02-10 DIAGNOSIS — R3915 Urgency of urination: Secondary | ICD-10-CM | POA: Diagnosis not present

## 2020-02-10 DIAGNOSIS — Z7984 Long term (current) use of oral hypoglycemic drugs: Secondary | ICD-10-CM | POA: Diagnosis not present

## 2020-02-10 DIAGNOSIS — G4730 Sleep apnea, unspecified: Secondary | ICD-10-CM | POA: Diagnosis not present

## 2020-02-10 DIAGNOSIS — T182XXA Foreign body in stomach, initial encounter: Secondary | ICD-10-CM | POA: Diagnosis not present

## 2020-02-10 DIAGNOSIS — R1314 Dysphagia, pharyngoesophageal phase: Secondary | ICD-10-CM

## 2020-02-10 DIAGNOSIS — I10 Essential (primary) hypertension: Secondary | ICD-10-CM | POA: Diagnosis not present

## 2020-02-10 DIAGNOSIS — K219 Gastro-esophageal reflux disease without esophagitis: Secondary | ICD-10-CM | POA: Insufficient documentation

## 2020-02-10 DIAGNOSIS — K228 Other specified diseases of esophagus: Secondary | ICD-10-CM

## 2020-02-10 HISTORY — PX: ESOPHAGOGASTRODUODENOSCOPY (EGD) WITH PROPOFOL: SHX5813

## 2020-02-10 HISTORY — PX: ESOPHAGEAL DILATION: SHX303

## 2020-02-10 LAB — GLUCOSE, CAPILLARY
Glucose-Capillary: 199 mg/dL — ABNORMAL HIGH (ref 70–99)
Glucose-Capillary: 163 mg/dL — ABNORMAL HIGH (ref 70–99)

## 2020-02-10 SURGERY — ESOPHAGOGASTRODUODENOSCOPY (EGD) WITH PROPOFOL
Anesthesia: General

## 2020-02-10 MED ORDER — LACTATED RINGERS IV SOLN: Freq: Once | INTRAVENOUS | Status: AC

## 2020-02-10 MED ORDER — GLYCOPYRROLATE 0.2 MG/ML IJ SOLN
0.2000 mg | Freq: Once | INTRAMUSCULAR | Status: DC
  Filled 2020-02-10: qty 1

## 2020-02-10 MED ORDER — STERILE WATER FOR IRRIGATION IR SOLN
Status: DC | PRN
  Administered 2020-02-10: 1.5 mL

## 2020-02-10 MED ORDER — LIDOCAINE VISCOUS HCL 2 % MT SOLN: 15.0000 mL | Freq: Once | OROMUCOSAL | Status: DC

## 2020-02-10 MED ORDER — LIDOCAINE HCL (CARDIAC) PF 50 MG/5ML IV SOSY
PREFILLED_SYRINGE | INTRAVENOUS | Status: DC | PRN
  Administered 2020-02-10: 40 mg via INTRAVENOUS

## 2020-02-10 MED ORDER — LIDOCAINE VISCOUS HCL 2 % MT SOLN
OROMUCOSAL | Status: DC | PRN
  Administered 2020-02-10: 1 via OROMUCOSAL

## 2020-02-10 MED ORDER — LIDOCAINE VISCOUS HCL 2 % MT SOLN
OROMUCOSAL | Status: AC
  Filled 2020-02-10: qty 15

## 2020-02-10 MED ORDER — PROPOFOL 10 MG/ML IV BOLUS
INTRAVENOUS | Status: DC | PRN
  Administered 2020-02-10: 100 mg via INTRAVENOUS
  Administered 2020-02-10: 150 ug/kg/min via INTRAVENOUS

## 2020-02-10 NOTE — H&P (Signed)
[name] is an 79 y.o. male.   Chief Complaint: Patient is here for esophagogastroduodenoscopy with esophageal dilation. HPI: Patient is 79 year old Afro-American male who has chronic GERD and history of Schatzki's ring which has been dilated in the past who presents with 49-monthhistory of dysphagia primarily to pills.  He may have occasional difficulty with solids.  He points to suprasternal area site of pill obstruction.  He says heartburn is well controlled.  He denies recent weight loss.  He says he is actually gained 15 pounds in the last few months.  No history of melena or abdominal pain. Last dose of baby aspirin was this morning.  Past Medical History:  Diagnosis Date  . Arthritis    fingers, back  . Chronic back pain   . Diabetes mellitus Since 1995   Takes Glucovance and Onglyza daily  . Diabetes mellitus without complication ([place]   . GERD (gastroesophageal reflux disease)    takes Dexilant and Omeprazole daily  . Herpes   . History of colon polyps   . History of gastric ulcer 25+yrs ago  . Hyperlipidemia Since 1995   takes Simvastatin daily  . Hypertension Since 1995   takes Metoprolol and Ramipril daily  . Hypertension   . Pneumonia    hx of;as a child  . Sleep apnea    doesn't use CPAP  . Urinary frequency   . Urinary urgency    takes Flomax daily    Past Surgical History:  Procedure Laterality Date  . BACK SURGERY  1962   Lumbar  . BACK SURGERY    . bilateral cataract surgery    . CARDIAC CATHETERIZATION  02/16/10  . CERVICAL SPINE SURGERY    . COLONOSCOPY    . COLONOSCOPY N/A 04/23/2013   Procedure: COLONOSCOPY;  Surgeon: NRogene Houston MD;  [facility];  Service: Endoscopy;  Laterality: N/A;  1030  . COLONOSCOPY N/A 07/24/2018   Procedure: COLONOSCOPY;  Surgeon: RRogene Houston MD;  Location: [facility];  Service: Endoscopy;  Laterality: N/A;  8:30  . ESOPHAGOGASTRODUODENOSCOPY N/A 12/24/2014   Procedure:  ESOPHAGOGASTRODUODENOSCOPY (EGD);  Surgeon: NRogene Houston MD;  Location: [facility];  Service: Endoscopy;  Laterality: N/A;  2:45  . ESOPHAGOGASTRODUODENOSCOPY (EGD) WITH ESOPHAGEAL DILATION    . HAND SURGERY     Right  . LUMBAR LAMINECTOMY/DECOMPRESSION MICRODISCECTOMY N/A 08/29/2012   Procedure: LUMBAR LAMINECTOMY/DECOMPRESSION MICRODISCECTOMY 1 LEVEL;  Surgeon: EFloyce Stakes MD;  [facility];  Service: Neurosurgery;  Laterality: N/A;  Lumbar four-five laminectomies  . NECK SURGERY    . PENILE PROSTHESIS IMPLANT    . ROTATOR CUFF REPAIR     Right  . SHOULDER SURGERY    . TRANSURETHRAL RESECTION OF PROSTATE N/A 05/25/2019   Procedure: TRANSURETHRAL RESECTION OF THE PROSTATE (TURP);  Surgeon: MCleon Gustin MD;  Location: [facility];  Service: Urology;  Laterality: N/A;  . WRIST SURGERY     Left  . WRIST SURGERY      Family History  Problem Relation Age of Onset  . Heart attack Father 882      MI  . Hypertension Father   . Heart attack Brother 576      MI  . Diabetes Mother   . Heart attack Paternal Uncle    Social History:  reports that he has never smoked. He has never used smokeless tobacco. He reports previous alcohol use. He reports current drug use. Frequency: 3.00 times  per week. Drug: Marijuana.  Allergies:  Allergies  Allergen Reactions  . Aspirin Other (See Comments)    Higher dosage upsets stomach  . Bayer Aspirin [Aspirin] Other (See Comments)    Stomach "flares up"    Facility-Administered Medications Prior to Admission  Medication Dose Route Frequency Provider Last Rate Last Admin  . ciprofloxacin (CIPRO) tablet 500 mg  500 mg Oral Once Cleon Gustin, MD       Medications Prior to Admission  Medication Sig Dispense Refill  . amLODipine (NORVASC) 10 MG tablet Take 10 mg by mouth daily.    Marland Kitchen glyBURIDE-metformin (GLUCOVANCE) 5-500 MG per tablet Take 2 tablets by mouth 2 (two) times daily with a meal.     . labetalol (NORMODYNE) 200  MG tablet Take 200 mg by mouth 2 (two) times daily.    . metoprolol succinate (TOPROL-XL) 50 MG 24 hr tablet Take 50 mg by mouth daily. Take with or immediately following a meal.    . naproxen (NAPROSYN) 500 MG tablet Take 500 mg by mouth 2 (two) times daily with a meal.    . omeprazole (PRILOSEC) 40 MG capsule Take 40 mg by mouth daily.    Marland Kitchen oxyCODONE (OXY IR/ROXICODONE) 5 MG immediate release tablet Take 5 mg by mouth 3 (three) times daily. For pain    . pantoprazole (PROTONIX) 40 MG tablet Take 1 tablet (40 mg total) by mouth 2 (two) times daily before a meal. 180 tablet 3  . ramipril (ALTACE) 10 MG capsule Take 10 mg by mouth daily.     Marland Kitchen acyclovir (ZOVIRAX) 200 MG capsule Take 200 mg by mouth as needed.     . Blood Glucose Monitoring Suppl (ONETOUCH VERIO FLEX SYSTEM) w/Device KIT USE TO TEST TWICE DAILY    . cetirizine (ZYRTEC) 10 MG tablet Take 10 mg by mouth daily.    . Lancets (ONETOUCH DELICA PLUS [id number]) MISC USE TO TEST TWICE DAILY    . ONETOUCH VERIO test strip USE TO TEST TWICE DAILY    . pantoprazole (PROTONIX) 40 MG tablet Take 1 tablet (40 mg total) by mouth daily. (Patient not taking: Reported on 07/30/2019) 30 tablet 11  . polyethylene glycol powder (GLYCOLAX) 17 GM/SCOOP powder Take 17 g by mouth daily. 850 g 1  . simvastatin (ZOCOR) 20 MG tablet Take 20 mg by mouth daily.    . tamsulosin (FLOMAX) 0.4 MG CAPS capsule Take 0.4 mg by mouth 2 (two) times daily. (Patient not taking: Reported on 02/03/2020)      Results for orders placed or performed during the hospital encounter of 02/10/20 (from the past 48 hour(s))  Glucose, capillary     Status: Abnormal   Collection Time: 02/10/20 10:59 AM  Result Value Ref Range   Glucose-Capillary 199 (H) 70 - 99 mg/dL    Comment: Glucose reference range applies only to samples taken after fasting for at least 8 hours.   No results found.  Review of Systems  Blood pressure (!) 146/78, pulse 68, temperature 97.8 F (36.6 C),  temperature source Oral, SpO2 100 %. Physical Exam HENT:     Mouth/Throat:     Mouth: Mucous membranes are moist.     Pharynx: Oropharynx is clear.  Eyes:     General: No scleral icterus.    Conjunctiva/sclera: Conjunctivae normal.  Cardiovascular:     Rate and Rhythm: Normal rate and regular rhythm.     Heart sounds: Normal heart sounds. No murmur heard.   Pulmonary:  Effort: Pulmonary effort is normal.     Breath sounds: Normal breath sounds.  Abdominal:     General: There is no distension.     Palpations: Abdomen is soft. There is no mass.     Tenderness: There is no abdominal tenderness.  Musculoskeletal:        General: No swelling.  Lymphadenopathy:     Cervical: No cervical adenopathy.  Skin:    General: Skin is warm and dry.  Neurological:     Mental Status: He is alert.      Assessment/Plan Esophageal dysphagia primarily to pills and occasionally to solids. History of Schatzki's ring. Esophagogastroduodenoscopy with esophageal dilation.  Hildred Laser, MD 02/10/2020, 11:57 AM

## 2020-02-10 NOTE — Transfer of Care (Signed)
Immediate Anesthesia Transfer of Care Note  Patient: SUMNER KIRCHMAN  Procedure(s) Performed: ESOPHAGOGASTRODUODENOSCOPY (EGD) WITH PROPOFOL (N/A ) ESOPHAGEAL DILATION (N/A )  Patient Location: PACU  Anesthesia Type:General  Level of Consciousness: awake, alert , oriented and patient cooperative  Airway & Oxygen Therapy: Patient Spontanous Breathing and Patient connected to nasal cannula oxygen  Post-op Assessment: Report given to RN, Post -op Vital signs reviewed and stable and Patient moving all extremities  Post vital signs: Reviewed and stable  Last Vitals:  Vitals Value Taken Time  BP    Temp    Pulse    Resp 19 02/10/20 1219  SpO2    Vitals shown include unvalidated device data.  Last Pain:  Vitals:   02/10/20 1202  TempSrc:   PainSc: 8       Patients Stated Pain Goal: 8 (33/82/50 5397)  Complications: No complications documented.

## 2020-02-10 NOTE — Op Note (Signed)
[facility] Patient Name: [name] Procedure Date: 02/10/2020 11:34 AM MRN: [id number] Date of Birth: 27-Aug-1940 Attending MD: Hildred Laser , MD CSN: [id number] Age: 79 Admit Type: Outpatient Procedure:                Upper GI endoscopy Indications:              Esophageal dysphagia Providers:                Hildred Laser, MD, Rosina Lowenstein, RN, Charlsie Quest.                            Theda Sers RN, RN Referring MD:             Ralene Bathe. Dondiego, MD Medicines:                Propofol per Anesthesia Complications:            No immediate complications. Estimated Blood Loss:     Estimated blood loss: none. Procedure:                Pre-Anesthesia Assessment:                           - Prior to the procedure, a History and Physical                            was performed, and patient medications and                            allergies were reviewed. The patient's tolerance of                            previous anesthesia was also reviewed. The risks                            and benefits of the procedure and the sedation                            options and risks were discussed with the patient.                            All questions were answered, and informed consent                            was obtained. Prior Anticoagulants: The patient has                            taken no previous anticoagulant or antiplatelet                            agents except for aspirin. ASA Grade Assessment:                            III - A patient with severe systemic disease. After  reviewing the risks and benefits, the patient was                            deemed in satisfactory condition to undergo the                            procedure.                           After obtaining informed consent, the endoscope was                            passed under direct vision. Throughout the                            procedure, the patient's blood  pressure, pulse, and                            oxygen saturations were monitored continuously. The                            [id number]-[id number] ([id number]) scope was introduced through the                            mouth, and advanced to the second part of duodenum.                            The upper GI endoscopy was accomplished without                            difficulty. The patient tolerated the procedure                            well. Scope In: 12:07:44 PM Scope Out: 12:15:19 PM Total Procedure Duration: 0 hours 7 minutes 35 seconds  Findings:      The hypopharynx was normal.      The examined esophagus was normal.      The Z-line was irregular and was found 40 cm from the incisors.      Pill fragments were found in the gastric body and antrum      The exam of the stomach was otherwise normal.      The duodenal bulb and second portion of the duodenum were normal.      No endoscopic abnormality was evident in the esophagus to explain the       patient's complaint of dysphagia. It was decided, however, to proceed       with dilation of the entire esophagus. The scope was withdrawn. Dilation       was performed with a Maloney dilator with no resistance at 30 Fr. The       dilation site was examined following endoscope reinsertion and showed no       change and no bleeding, mucosal tear or perforation. Impression:               - Normal hypopharynx.                           -  Normal esophagus.                           - Z-line irregular, 40 cm from the incisors.                           - Pill fragments were found in the stomach.                           - Normal duodenal bulb and second portion of the                            duodenum.                           - No endoscopic esophageal abnormality to explain                            patient's dysphagia. Esophagus dilated. Dilated.                           - No specimens collected.                           comment: suspect  esophageal motility order. Moderate Sedation:      Per Anesthesia Care Recommendation:           - Patient has a contact number available for                            emergencies. The signs and symptoms of potential                            delayed complications were discussed with the                            patient. Return to normal activities tomorrow.                            Written discharge instructions were provided to the                            patient.                           - Resume previous diet today.                           - Continue present medications.                           - Telrphone call to GI clinic in 1 week. Procedure Code(s):        --- Professional ---                           [phone], Esophagogastroduodenoscopy, flexible,  transoral; diagnostic, including collection of                            specimen(s) by brushing or washing, when performed                            (separate procedure)                           43450, Dilation of esophagus, by unguided sound or                            bougie, single or multiple passes Diagnosis Code(s):        --- Professional ---                           K22.8, Other specified diseases of esophagus                           T18.2XXA, Foreign body in stomach, initial encounter                           R13.14, Dysphagia, pharyngoesophageal phase CPT copyright 2019 American Medical Association. All rights reserved. The codes documented in this report are preliminary and upon coder review may  be revised to meet current compliance requirements. Hildred Laser, MD Hildred Laser, MD 02/10/2020 12:25:21 PM This report has been signed electronically. Number of Addenda: 0

## 2020-02-10 NOTE — Discharge Instructions (Signed)
Keep Naprosyn or naproxen use to minimum. Resume other medications as before. Resume usual diet.  Remember to chew food thoroughly for swallowing. No driving for 24 hours. Please call office with progress report in 1 to 2 weeks.  Moderate Conscious Sedation, Adult, Care After These instructions provide you with information about caring for yourself after your procedure. Your health care provider may also give you more specific instructions. Your treatment has been planned according to current medical practices, but problems sometimes occur. Call your health care provider if you have any problems or questions after your procedure. What can I expect after the procedure? After your procedure, it is common:  To feel sleepy for several hours.  To feel clumsy and have poor balance for several hours.  To have poor judgment for several hours.  To vomit if you eat too soon. Follow these instructions at home: For at least 24 hours after the procedure:   Do not: ? Participate in activities where you could fall or become injured. ? Drive. ? Use heavy machinery. ? Drink alcohol. ? Take sleeping pills or medicines that cause drowsiness. ? Make important decisions or sign legal documents. ? Take care of children on your own.  Rest. Eating and drinking  Follow the diet recommended by your health care provider.  If you vomit: ? Drink water, juice, or soup when you can drink without vomiting. ? Make sure you have little or no nausea before eating solid foods. General instructions  Have a responsible adult stay with you until you are awake and alert.  Take over-the-counter and prescription medicines only as told by your health care provider.  If you smoke, do not smoke without supervision.  Keep all follow-up visits as told by your health care provider. This is important. Contact a health care provider if:  You keep feeling nauseous or you keep vomiting.  You feel  light-headed.  You develop a rash.  You have a fever. Get help right away if:  You have trouble breathing. This information is not intended to replace advice given to you by your health care provider. Make sure you discuss any questions you have with your health care provider. Document Revised: 06/07/2017 Document Reviewed: 10/15/2015 Elsevier Patient Education  [employer].  Upper Endoscopy, Adult, Care After This sheet gives you information about how to care for yourself after your procedure. Your health care provider may also give you more specific instructions. If you have problems or questions, contact your health care provider. What can I expect after the procedure? After the procedure, it is common to have:  A sore throat.  Mild stomach pain or discomfort.  Bloating.  Nausea. Follow these instructions at home:   Follow instructions from your health care provider about what to eat or drink after your procedure.  Return to your normal activities as told by your health care provider. Ask your health care provider what activities are safe for you.  Take over-the-counter and prescription medicines only as told by your health care provider.  Do not drive for 24 hours if you were given a sedative during your procedure.  Keep all follow-up visits as told by your health care provider. This is important. Contact a health care provider if you have:  A sore throat that lasts longer than one day.  Trouble swallowing. Get help right away if:  You vomit blood or your vomit looks like coffee grounds.  You have: ? A fever. ? Bloody, black, or tarry stools. ?  A severe sore throat or you cannot swallow. ? Difficulty breathing. ? Severe pain in your chest or abdomen. Summary  After the procedure, it is common to have a sore throat, mild stomach discomfort, bloating, and nausea.  Do not drive for 24 hours if you were given a sedative during the procedure.  Follow  instructions from your health care provider about what to eat or drink after your procedure.  Return to your normal activities as told by your health care provider. This information is not intended to replace advice given to you by your health care provider. Make sure you discuss any questions you have with your health care provider. Document Revised: 12/17/2017 Document Reviewed: 11/25/2017 Elsevier Patient Education  [place].

## 2020-02-10 NOTE — Anesthesia Preprocedure Evaluation (Addendum)
Anesthesia Evaluation  Patient identified by MRN, date of birth, ID band Patient awake    Reviewed: Allergy & Precautions, NPO status , Patient's Chart, lab work & pertinent test results  History of Anesthesia Complications Negative for: history of anesthetic complications  Airway Mallampati: III  TM Distance: >3 FB Neck ROM: Full    Dental  (+) Missing, Dental Advisory Given, Caps   Pulmonary sleep apnea , pneumonia, resolved,    Pulmonary exam normal breath sounds clear to auscultation       Cardiovascular Exercise Tolerance: Good hypertension, Pt. on medications Normal cardiovascular exam Rhythm:Regular Rate:Normal     Neuro/Psych negative neurological ROS  negative psych ROS   GI/Hepatic negative GI ROS, Neg liver ROS, GERD  Medicated and Controlled,  Endo/Other  diabetes, Well Controlled, Type 2, Oral Hypoglycemic Agents  Renal/GU negative Renal ROS     Musculoskeletal  (+) Arthritis , Osteoarthritis,    Abdominal   Peds  Hematology   Anesthesia Other Findings   Reproductive/Obstetrics                            Anesthesia Physical Anesthesia Plan  ASA: III  Anesthesia Plan: General   Post-op Pain Management:    Induction: Intravenous  PONV Risk Score and Plan: TIVA  Airway Management Planned: Nasal Cannula, Natural Airway and Simple Face Mask  Additional Equipment:   Intra-op Plan:   Post-operative Plan:   Informed Consent: I have reviewed the patients History and Physical, chart, labs and discussed the procedure including the risks, benefits and alternatives for the proposed anesthesia with the patient or authorized representative who has indicated his/her understanding and acceptance.     Dental advisory given  Plan Discussed with: CRNA and Surgeon  Anesthesia Plan Comments:        Anesthesia Quick Evaluation

## 2020-02-10 NOTE — Anesthesia Postprocedure Evaluation (Signed)
Anesthesia Post Note  Patient: [name]  Procedure(s) Performed: ESOPHAGOGASTRODUODENOSCOPY (EGD) WITH PROPOFOL (N/A ) ESOPHAGEAL DILATION (N/A )  Patient location during evaluation: PACU Anesthesia Type: General Level of consciousness: awake, oriented, awake and alert and patient cooperative Pain management: pain level controlled Vital Signs Assessment: post-procedure vital signs reviewed and stable Respiratory status: spontaneous breathing, respiratory function stable, nonlabored ventilation and patient connected to nasal cannula oxygen Cardiovascular status: blood pressure returned to baseline and stable Postop Assessment: no headache and no backache Anesthetic complications: no   No complications documented.   Last Vitals:  Vitals:   02/10/20 1054  BP: (!) 146/78  Pulse: 68  Temp: 36.6 C  SpO2: 100%    Last Pain:  Vitals:   02/10/20 1202  TempSrc:   PainSc: [place]

## 2020-02-15 ENCOUNTER — Encounter (HOSPITAL_COMMUNITY): Payer: Self-pay | Admitting: Internal Medicine

## 2020-08-01 ENCOUNTER — Ambulatory Visit: Payer: Medicare HMO | Admitting: Urology

## 2020-10-25 ENCOUNTER — Other Ambulatory Visit: Payer: Self-pay

## 2020-10-25 ENCOUNTER — Encounter (INDEPENDENT_AMBULATORY_CARE_PROVIDER_SITE_OTHER): Payer: Self-pay | Admitting: Gastroenterology

## 2020-10-25 ENCOUNTER — Ambulatory Visit (INDEPENDENT_AMBULATORY_CARE_PROVIDER_SITE_OTHER): Payer: Medicare HMO | Admitting: Gastroenterology

## 2020-10-25 DIAGNOSIS — K5903 Drug induced constipation: Secondary | ICD-10-CM | POA: Diagnosis not present

## 2020-10-25 DIAGNOSIS — K5900 Constipation, unspecified: Secondary | ICD-10-CM | POA: Insufficient documentation

## 2020-10-25 NOTE — Patient Instructions (Addendum)
Start taking Miralax 1 cap every day for one week. If bowel movements do not improve, increase to 1 cup every 12 hours. If after two weeks there is no improvement, increase to 1 cup every 8 hours If abdominal pain persists despite improving BM, may consider CT scan of the abdomen.

## 2020-10-25 NOTE — Progress Notes (Signed)
[name], [name] Gastroenterology & Hepatology [facility] [address] [place], [place] [address]  Primary Care Physician: Barry Gaskins, MD [address] [place] [address]  I will communicate my assessment and recommendations to the referring MD via EMR.  Problems: 1. Constipation 2. Esophageal dysmotility  History of Present Illness: [name] is a 80 y.o. 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  The patient was last seen on 01/27/2020. At that time, the patient underwent an EGD for evaluation of recurrent dysphagia.  Findings are described below.  Patient reports that since Summer of 2021 he has been presenting intermittent episodes of sharp stabbing pain in the periumbical area. He reports that he has also noticed some intermittent constipation, states that he moves his bowels twice a week on average. He has tried some laxatives OTC but does not remember the name of them. He reports no improvement after taking these medications. States that when he strains, small amount of pebble shaped stools are coming out. He states that after moving his bowel movements, his abdominal pain gets better.  He reports that he feels that when he swallows he has very occasional scratchy sensation but this is self limited and goes away on its own. Denies any dysphagia at this moment, he states he improved with [facility] dilation recently.  The patient denies having any nausea, vomiting, fever, chills, hematochezia, melena, hematemesis, abdominal distention, heartburn, diarrhea, jaundice, pruritus or weight loss.  Takes oxycodone intermittently for back pain.  Previous imaging: Barium esophagram on 11/26/2018 Mild tertiary contractions are noted in distal esophagus  suggesting presbyesophagus. No other abnormality seen in the esophagus.  Last EGD: 02/10/2020, esophagus was normal, and empiric dilation via [facility] dilator was performed up to [place], there was no presence of mucosal disruption.  Stomach and small bowel were normal.  Last Colonoscopy: 2020 - - The entire examined colon is normal. - Internal hemorrhoids. - No specimens collected.  Past Medical History: Past Medical History:  Diagnosis Date  . Arthritis    fingers, back  . Chronic back pain   . Diabetes mellitus Since 1995   Takes Glucovance and Onglyza daily  . Diabetes mellitus without complication ([place])   . GERD (gastroesophageal reflux disease)    takes Dexilant and Omeprazole daily  . Herpes   . History of colon polyps   . History of gastric ulcer 25+yrs ago  . Hyperlipidemia Since 1995   takes Simvastatin daily  . Hypertension Since 1995   takes Metoprolol and Ramipril daily  . Hypertension   . Pneumonia    hx of;as a child  . Sleep apnea    doesn't use CPAP  . Urinary frequency   . Urinary urgency    takes Flomax daily    Past Surgical History: Past Surgical History:  Procedure Laterality Date  . BACK SURGERY  1962   Lumbar  . BACK SURGERY    . bilateral cataract surgery    . CARDIAC CATHETERIZATION  02/16/10  . CERVICAL SPINE SURGERY    . COLONOSCOPY    . COLONOSCOPY N/A 04/23/2013   Procedure: COLONOSCOPY;  Surgeon: Rogene Houston, MD;  [facility];  Service: Endoscopy;  Laterality: N/A;  1030  . COLONOSCOPY N/A 07/24/2018   Procedure: COLONOSCOPY;  Surgeon: Rogene Houston, MD;  Location: [facility];  Service: Endoscopy;  Laterality: N/A;  8:30  . ESOPHAGEAL DILATION N/A 02/10/2020   Procedure: ESOPHAGEAL DILATION;  Surgeon: Rogene Houston, MD;  [facility];  Service: Endoscopy;  Laterality: N/A;  . ESOPHAGOGASTRODUODENOSCOPY N/A 12/24/2014   Procedure: ESOPHAGOGASTRODUODENOSCOPY (EGD);  Surgeon: Rogene Houston, MD;   [facility];  Service: Endoscopy;  Laterality: N/A;  2:45  . ESOPHAGOGASTRODUODENOSCOPY (EGD) WITH ESOPHAGEAL DILATION    . ESOPHAGOGASTRODUODENOSCOPY (EGD) WITH PROPOFOL N/A 02/10/2020   Procedure: ESOPHAGOGASTRODUODENOSCOPY (EGD) WITH PROPOFOL;  Surgeon: Rogene Houston, MD;  [facility];  Service: Endoscopy;  Laterality: N/A;  250  . HAND SURGERY     Right  . LUMBAR LAMINECTOMY/DECOMPRESSION MICRODISCECTOMY N/A 08/29/2012   Procedure: LUMBAR LAMINECTOMY/DECOMPRESSION MICRODISCECTOMY 1 LEVEL;  Surgeon: Floyce Stakes, MD;  Location: [place] [facility];  Service: Neurosurgery;  Laterality: N/A;  Lumbar four-five laminectomies  . NECK SURGERY    . PENILE PROSTHESIS IMPLANT    . ROTATOR CUFF REPAIR     Right  . SHOULDER SURGERY    . TRANSURETHRAL RESECTION OF PROSTATE N/A 05/25/2019   Procedure: TRANSURETHRAL RESECTION OF THE PROSTATE (TURP);  Surgeon: Cleon Gustin, MD;  Location: [facility];  Service: Urology;  Laterality: N/A;  . WRIST SURGERY     Left  . WRIST SURGERY      Family History: Family History  Problem Relation Age of Onset  . Heart attack Father 1       MI  . Hypertension Father   . Heart attack Brother 75       MI  . Diabetes Mother   . Heart attack Paternal Uncle     Social History: Social History   Tobacco Use  Smoking Status Never Smoker  Smokeless Tobacco Never Used   Social History   Substance and Sexual Activity  Alcohol Use Not Currently   Comment: occasionally    Social History   Substance and Sexual Activity  Drug Use Yes  . Frequency: 3.0 times per week  . Types: Marijuana    Allergies: Allergies  Allergen Reactions  . Aspirin Other (See Comments)    Higher dosage upsets stomach  . Bayer Aspirin [Aspirin] Other (See Comments)    Stomach "flares up"    Medications: Current Outpatient Medications  Medication Sig Dispense Refill  . acyclovir (ZOVIRAX) 200 MG capsule Take 200 mg by mouth as needed.     . Blood  Glucose Monitoring Suppl (ONETOUCH VERIO FLEX SYSTEM) w/Device KIT USE TO TEST TWICE DAILY    . glyBURIDE-metformin (GLUCOVANCE) 5-500 MG per tablet Take 2 tablets by mouth 2 (two) times daily with a meal.     . labetalol (NORMODYNE) 200 MG tablet Take 200 mg by mouth 2 (two) times daily.    . Lancets (ONETOUCH DELICA PLUS [id number]) MISC USE TO TEST TWICE DAILY    . metoprolol succinate (TOPROL-XL) 50 MG 24 hr tablet Take 50 mg by mouth daily. Take with or immediately following a meal.    . omeprazole (PRILOSEC) 40 MG capsule Take 40 mg by mouth daily.    Glory Rosebush VERIO test strip USE TO TEST TWICE DAILY    . oxyCODONE (OXY IR/ROXICODONE) 5 MG immediate release tablet Take 5 mg by mouth 3 (three) times daily. For pain    . ramipril (ALTACE) 10 MG capsule Take 10 mg by mouth daily.     . simvastatin (ZOCOR) 20 MG tablet Take 20 mg by mouth daily.    Marland Kitchen  amLODipine (NORVASC) 10 MG tablet Take 10 mg by mouth daily.    . cetirizine (ZYRTEC) 10 MG tablet Take 10 mg by mouth daily.    . naproxen (NAPROSYN) 500 MG tablet Take 500 mg by mouth 2 (two) times daily with a meal. (Patient not taking: Reported on 10/25/2020)    . polyethylene glycol powder (GLYCOLAX) 17 GM/SCOOP powder Take 17 g by mouth daily. (Patient not taking: Reported on 10/25/2020) 850 g 1   No current facility-administered medications for this visit.    Review of Systems: GENERAL: negative for malaise, night sweats HEENT: No changes in hearing or vision, no nose bleeds or other nasal problems. NECK: Negative for lumps, goiter, pain and significant neck swelling RESPIRATORY: Negative for cough, wheezing CARDIOVASCULAR: Negative for chest pain, leg swelling, palpitations, orthopnea GI: SEE HPI MUSCULOSKELETAL: Negative for joint pain or swelling, back pain, and muscle pain. SKIN: Negative for lesions, rash PSYCH: Negative for sleep disturbance, mood disorder and recent psychosocial stressors. HEMATOLOGY Negative for prolonged  bleeding, bruising easily, and swollen nodes. ENDOCRINE: Negative for cold or heat intolerance, polyuria, polydipsia and goiter. NEURO: negative for tremor, gait imbalance, syncope and seizures. The remainder of the review of systems is noncontributory.   Physical Exam: BP 118/70 (BP Location: Left Arm, Patient Position: Sitting, Cuff Size: Large)   Pulse 71   Temp 98.1 F (36.7 C) (Oral)   Ht _0  (1.778 m)   Wt 161 lb (73 kg)   BMI 23.10 kg/m  GENERAL: The patient is AO x3, in no acute distress. HEENT: Head is normocephalic and atraumatic. EOMI are intact. Mouth is well hydrated and without lesions. NECK: Supple. No masses LUNGS: Clear to auscultation. No presence of rhonchi/wheezing/rales. Adequate chest expansion HEART: RRR, normal s1 and s2. ABDOMEN: Soft, nontender, no guarding, no peritoneal signs, and nondistended. BS +. No masses. EXTREMITIES: Without any cyanosis, clubbing, rash, lesions or edema. NEUROLOGIC: AOx3, no focal motor deficit. SKIN: no jaundice, no rashes  Imaging/Labs: as above  I personally reviewed and interpreted the available labs, imaging and endoscopic files.  Impression and Plan: [name] is a 80 y.o. 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  Patient had new onset of abdominal pain and constipation, which I think are related to opiate induced constipation as he has presented improvement of his symptomatology after moving his bowels.   I explained to him that as long as he takes his medications his symptoms will persist, but management with MiraLAX can help improve his symptomatology.  If the patient persists with abdominal pain and discomfort, we we will consider performing a CT of the abdomen and pelvis with IV contrast as his most recent cross-sectional abdominal imaging was  performed in 2019.  Patient understood and agreed.  Regarding his previous dysphagia complaints, he has felt improvement after recent empiric dilation and no further intervention is warranted at this point.  - Start taking Miralax 1 cap every day for one week. If bowel movements do not improve, increase to 1 cup every 12 hours. If after two weeks there is no improvement, increase to 1 cup every 8 hours - If abdominal pain persists despite improving BM, may consider CT scan of the abdomen.  All questions were answered.      Harvel Quale, MD Gastroenterology and Hepatology [facility]

## 2020-12-29 ENCOUNTER — Telehealth: Payer: Self-pay

## 2020-12-29 NOTE — Telephone Encounter (Signed)
No new patient per [place] 

## 2021-01-12 ENCOUNTER — Other Ambulatory Visit (HOSPITAL_COMMUNITY)
Admission: RE | Admit: 2021-01-12 | Discharge: 2021-01-12 | Disposition: A | Discharge status: Home / Self Care | Payer: Medicare HMO | Source: Ambulatory Visit | Attending: Internal Medicine | Admitting: Internal Medicine

## 2021-01-12 ENCOUNTER — Other Ambulatory Visit: Payer: Self-pay

## 2021-01-12 ENCOUNTER — Ambulatory Visit (HOSPITAL_COMMUNITY)
Admission: RE | Admit: 2021-01-12 | Discharge: 2021-01-12 | Disposition: A | Discharge status: Home / Self Care | Payer: Medicare HMO | Source: Ambulatory Visit | Attending: Gerontology | Admitting: Gerontology

## 2021-01-12 ENCOUNTER — Other Ambulatory Visit (HOSPITAL_COMMUNITY): Payer: Self-pay | Admitting: Gerontology

## 2021-01-12 DIAGNOSIS — M545 Low back pain, unspecified: Secondary | ICD-10-CM

## 2021-01-12 DIAGNOSIS — E1142 Type 2 diabetes mellitus with diabetic polyneuropathy: Secondary | ICD-10-CM | POA: Insufficient documentation

## 2021-01-12 DIAGNOSIS — M25519 Pain in unspecified shoulder: Secondary | ICD-10-CM | POA: Diagnosis present

## 2021-01-12 DIAGNOSIS — Z79899 Other long term (current) drug therapy: Secondary | ICD-10-CM | POA: Insufficient documentation

## 2021-01-12 DIAGNOSIS — Z0001 Encounter for general adult medical examination with abnormal findings: Secondary | ICD-10-CM | POA: Insufficient documentation

## 2021-01-12 LAB — BASIC METABOLIC PANEL
Anion gap: 7 (ref 5–15)
BUN: 18 mg/dL (ref 8–23)
CO2: 25 mmol/L (ref 22–32)
Calcium: 9.5 mg/dL (ref 8.9–10.3)
Chloride: 104 mmol/L (ref 98–111)
Creatinine, Ser: 1.03 mg/dL (ref 0.61–1.24)
GFR, Estimated: >60 mL/min (ref >60)
Glucose, Bld: 165 mg/dL — ABNORMAL HIGH (ref 70–99)
Potassium: 4.4 mmol/L (ref 3.5–5.1)
Sodium: 136 mmol/L (ref 135–145)

## 2021-01-12 LAB — CBC WITH DIFFERENTIAL/PLATELET
Abs Immature Granulocytes: 0 10*3/uL (ref 0.00–0.07)
Basophils Absolute: 0 10*3/uL (ref 0.0–0.1)
Basophils Relative: 1 %
Eosinophils Absolute: 0.1 10*3/uL (ref 0.0–0.5)
Eosinophils Relative: 3 %
HCT: 37.1 % — ABNORMAL LOW (ref 39.0–52.0)
Hemoglobin: 12.2 g/dL — ABNORMAL LOW (ref 13.0–17.0)
Immature Granulocytes: 0 %
Lymphocytes Relative: 44 %
Lymphs Abs: 1.3 10*3/uL (ref 0.7–4.0)
MCH: 31.4 pg (ref 26.0–34.0)
MCHC: 32.9 g/dL (ref 30.0–36.0)
MCV: 95.4 fL (ref 80.0–100.0)
Monocytes Absolute: 0.3 10*3/uL (ref 0.1–1.0)
Monocytes Relative: 11 %
Neutro Abs: 1.2 10*3/uL — ABNORMAL LOW (ref 1.7–7.7)
Neutrophils Relative %: 41 %
Platelets: 193 10*3/uL (ref 150–400)
RBC: 3.89 MIL/uL — ABNORMAL LOW (ref 4.22–5.81)
RDW: 13.6 % (ref 11.5–15.5)
WBC: 2.9 10*3/uL — ABNORMAL LOW (ref 4.0–10.5)
nRBC: 0 % (ref 0.0–0.2)

## 2021-01-12 LAB — HEPATIC FUNCTION PANEL
ALT: 21 U/L (ref 0–44)
AST: 22 U/L (ref 15–41)
Albumin: 4.3 g/dL (ref 3.5–5.0)
Alkaline Phosphatase: 42 U/L (ref 38–126)
Bilirubin, Direct: 0.1 mg/dL (ref 0.0–0.2)
Indirect Bilirubin: 0.5 mg/dL (ref 0.3–0.9)
Total Bilirubin: 0.6 mg/dL (ref 0.3–1.2)
Total Protein: 8 g/dL (ref 6.5–8.1)

## 2021-01-12 LAB — HEMOGLOBIN A1C
Hgb A1c MFr Bld: 8.5 % — ABNORMAL HIGH (ref 4.8–5.6)
Mean Plasma Glucose: 197.25 mg/dL

## 2021-01-12 LAB — LIPID PANEL
Cholesterol: 146 mg/dL (ref 0–200)
HDL: 62 mg/dL (ref >40)
LDL Cholesterol: 75 mg/dL (ref 0–99)
Total CHOL/HDL Ratio: 2.4 RATIO
Triglycerides: 44 mg/dL (ref <150)
VLDL: 9 mg/dL (ref 0–40)

## 2021-01-13 LAB — MICROALBUMIN / CREATININE URINE RATIO
Creatinine, Urine: 45 mg/dL
Microalb Creat Ratio: 144 mg/g creat — ABNORMAL HIGH (ref 0–29)
Microalb, Ur: 65 ug/mL — ABNORMAL HIGH

## 2021-04-14 ENCOUNTER — Emergency Department (HOSPITAL_COMMUNITY)
Admission: EM | Admit: 2021-04-14 | Discharge: 2021-04-14 | Disposition: A | Discharge status: Home / Self Care | Payer: Medicare HMO | Attending: Emergency Medicine | Admitting: Emergency Medicine

## 2021-04-14 ENCOUNTER — Other Ambulatory Visit: Payer: Self-pay

## 2021-04-14 ENCOUNTER — Encounter (HOSPITAL_COMMUNITY): Payer: Self-pay | Admitting: Emergency Medicine

## 2021-04-14 DIAGNOSIS — Y9289 Other specified places as the place of occurrence of the external cause: Secondary | ICD-10-CM | POA: Diagnosis not present

## 2021-04-14 DIAGNOSIS — E119 Type 2 diabetes mellitus without complications: Secondary | ICD-10-CM | POA: Insufficient documentation

## 2021-04-14 DIAGNOSIS — I10 Essential (primary) hypertension: Secondary | ICD-10-CM | POA: Insufficient documentation

## 2021-04-14 DIAGNOSIS — Z79899 Other long term (current) drug therapy: Secondary | ICD-10-CM | POA: Diagnosis not present

## 2021-04-14 DIAGNOSIS — S00512A Abrasion of oral cavity, initial encounter: Secondary | ICD-10-CM | POA: Insufficient documentation

## 2021-04-14 DIAGNOSIS — Y9389 Activity, other specified: Secondary | ICD-10-CM | POA: Insufficient documentation

## 2021-04-14 DIAGNOSIS — X58XXXA Exposure to other specified factors, initial encounter: Secondary | ICD-10-CM | POA: Insufficient documentation

## 2021-04-14 DIAGNOSIS — Z7984 Long term (current) use of oral hypoglycemic drugs: Secondary | ICD-10-CM | POA: Insufficient documentation

## 2021-04-14 DIAGNOSIS — S00502A Unspecified superficial injury of oral cavity, initial encounter: Secondary | ICD-10-CM | POA: Diagnosis present

## 2021-04-14 NOTE — Discharge Instructions (Addendum)
You were evaluated in the Emergency Department and after careful evaluation, we did not find any emergent condition requiring admission or further testing in the hospital.  Your exam/testing today was overall reassuring.  Please return to the Emergency Department if you experience any worsening of your condition.  Thank you for allowing us to be a part of your care.  

## 2021-04-14 NOTE — ED Provider Notes (Signed)
[facility] Emergency Department Provider Note MRN:  [id number]  Arrival date & time: 04/14/21     Chief Complaint   Total problem History of Present Illness   [name] is a 80 y.o. year-old male with a history of diabetes presenting to the ED with chief complaint of tongue problem.  Location: Left lateral tongue Duration: Couple hours Onset: Sudden Timing: Constant Description: Burning pain Severity: Mild Exacerbating/Alleviating Factors: Worse with palpation Associated Symptoms: None Pertinent Negatives: No fever, no nausea vomiting, no diarrhea, no chest pain or shortness of breath  Additional History: Tongue started hurting after brushing teeth.  Worried that he used to the [employer], which has a boil before use warning over the past few days.  Review of Systems  A complete 10 system review of systems was obtained and all systems are negative except as noted in the HPI and PMH.   Patient's Health History    Past Medical History:  Diagnosis Date   Arthritis    fingers, back   Chronic back pain    Diabetes mellitus Since 1995   Takes Glucovance and Onglyza daily   Diabetes mellitus without complication ([place])    GERD (gastroesophageal reflux disease)    takes Dexilant and Omeprazole daily   Herpes    History of colon polyps    History of gastric ulcer 25+yrs ago   Hyperlipidemia Since 1995   takes Simvastatin daily   Hypertension Since 1995   takes Metoprolol and Ramipril daily   Hypertension    Pneumonia    hx of;as a child   Sleep apnea    doesn't use CPAP   Urinary frequency    Urinary urgency    takes Flomax daily    Past Surgical History:  Procedure Laterality Date   BACK SURGERY  1962   Lumbar   BACK SURGERY     bilateral cataract surgery     CARDIAC CATHETERIZATION  02/16/10   CERVICAL SPINE SURGERY     COLONOSCOPY     COLONOSCOPY N/A 04/23/2013   Procedure: COLONOSCOPY;  Surgeon: Rogene Houston, MD;   [facility];  Service: Endoscopy;  Laterality: N/A;  1030   COLONOSCOPY N/A 07/24/2018   Procedure: COLONOSCOPY;  Surgeon: Rogene Houston, MD;  [facility];  Service: Endoscopy;  Laterality: N/A;  8:30   ESOPHAGEAL DILATION N/A 02/10/2020   Procedure: ESOPHAGEAL DILATION;  Surgeon: Rogene Houston, MD;  [facility];  Service: Endoscopy;  Laterality: N/A;   ESOPHAGOGASTRODUODENOSCOPY N/A 12/24/2014   Procedure: ESOPHAGOGASTRODUODENOSCOPY (EGD);  Surgeon: Rogene Houston, MD;  [facility];  Service: Endoscopy;  Laterality: N/A;  2:45   ESOPHAGOGASTRODUODENOSCOPY (EGD) WITH ESOPHAGEAL DILATION     ESOPHAGOGASTRODUODENOSCOPY (EGD) WITH PROPOFOL N/A 02/10/2020   Procedure: ESOPHAGOGASTRODUODENOSCOPY (EGD) WITH PROPOFOL;  Surgeon: Rogene Houston, MD;  [facility];  Service: Endoscopy;  Laterality: N/A;  250   HAND SURGERY     Right   LUMBAR LAMINECTOMY/DECOMPRESSION MICRODISCECTOMY N/A 08/29/2012   Procedure: LUMBAR LAMINECTOMY/DECOMPRESSION MICRODISCECTOMY 1 LEVEL;  Surgeon: Floyce Stakes, MD;  Location: [facility];  Service: Neurosurgery;  Laterality: N/A;  Lumbar four-five laminectomies   NECK SURGERY     PENILE PROSTHESIS IMPLANT     ROTATOR CUFF REPAIR     Right   SHOULDER SURGERY     TRANSURETHRAL RESECTION OF PROSTATE N/A 05/25/2019   Procedure: TRANSURETHRAL RESECTION OF THE PROSTATE (TURP);  Surgeon: Nicolette Bang  L, MD;  Location: [facility];  Service: Urology;  Laterality: N/A;   WRIST SURGERY     Left   WRIST SURGERY      Family History  Problem Relation Age of Onset   Heart attack Father 58       MI   Hypertension Father    Heart attack Brother 39       MI   Diabetes Mother    Heart attack Paternal Uncle     Social History   Socioeconomic History   Marital status: Single    Spouse name: Not on file   Number of children: 8   Years of education: Not on file   Highest education level: Not on file   Occupational History   Occupation: [employer]    Employer: RETIRED    Comment: Retired  Tobacco Use   Smoking status: Never   Smokeless tobacco: Never  Scientific laboratory technician Use: Never used  Substance and Sexual Activity   Alcohol use: Not Currently    Comment: occasionally    Drug use: Yes    Frequency: 3.0 times per week    Types: Marijuana   Sexual activity: Never  Other Topics Concern   Not on file  Social History Narrative   ** Merged History Encounter **       Social Determinants of Health   Financial Resource Strain: Not on file  Food Insecurity: Not on file  Transportation Needs: Not on file  Physical Activity: Not on file  Stress: Not on file  Social Connections: Not on file  Intimate Partner Violence: Not on file     Physical Exam   Vitals:   04/14/21 0056  BP: 109/70  Pulse: 72  Resp: 18  Temp: 98 F (36.7 C)  SpO2: 96%    CONSTITUTIONAL: Well-appearing, NAD NEURO:  Alert and oriented x 3, no focal deficits EYES:  eyes equal and reactive ENT/NECK:  no LAD, no JVD CARDIO: Regular rate, well-perfused, normal S1 and S2 PULM:  CTAB no wheezing or rhonchi GI/GU:  normal bowel sounds, non-distended, non-tender MSK/SPINE:  No gross deformities, no edema SKIN:  no rash, atraumatic PSYCH:  Appropriate speech and behavior  *Additional and/or pertinent findings included in MDM below  Diagnostic and Interventional Summary    EKG Interpretation  Date/Time:    Ventricular Rate:    PR Interval:    QRS Duration:   QT Interval:    QTC Calculation:   R Axis:     Text Interpretation:         Labs Reviewed - No data to display  No orders to display    Medications - No data to display   Procedures  /  Critical Care Procedures  ED Course and Medical Decision Making  I have reviewed the triage vital signs, the nursing notes, and pertinent available records from the EMR.  Listed above are laboratory and imaging tests that I personally  ordered, reviewed, and interpreted and then considered in my medical decision making (see below for details).  Very small abrasion to the lateral tongue, likely caused by brushing of the teeth.  Was exposed to the [place] water but no infectious symptoms.  Normal vital signs, benign abdomen, nothing to suggest emergent process, provided reassurance, appropriate for discharge.       Barth Kirks. Sedonia Small, MD [place] [email or web address]  Final Clinical Impressions(s) / ED Diagnoses     ICD-10-CM  1. Abrasion of tongue, initial encounter  W25.749T       ED Discharge Orders     None        Discharge Instructions Discussed with and Provided to Patient:    Discharge Instructions      You were evaluated in the Emergency Department and after careful evaluation, we did not find any emergent condition requiring admission or further testing in the hospital.  Your exam/testing today was overall reassuring.  Please return to the Emergency Department if you experience any worsening of your condition.  Thank you for allowing [place] to be a part of your care.        Maudie Flakes, MD 04/14/21 [phone]

## 2021-04-14 NOTE — ED Triage Notes (Signed)
Pt states that he used the water at home to brush his teeth and make him some tea. Pt c/o pain to lower right side of his gums. Pt concerned due to current water advisory.

## 2021-04-17 ENCOUNTER — Other Ambulatory Visit: Payer: Self-pay

## 2021-04-17 ENCOUNTER — Ambulatory Visit (INDEPENDENT_AMBULATORY_CARE_PROVIDER_SITE_OTHER): Payer: Medicare HMO | Admitting: Gastroenterology

## 2021-04-17 ENCOUNTER — Encounter (INDEPENDENT_AMBULATORY_CARE_PROVIDER_SITE_OTHER): Payer: Self-pay | Admitting: Gastroenterology

## 2021-04-17 VITALS — BP 114/70 | HR 66 | Temp 97.8°F | Ht 70.0 in | Wt 154.9 lb

## 2021-04-17 DIAGNOSIS — K5903 Drug induced constipation: Secondary | ICD-10-CM | POA: Diagnosis not present

## 2021-04-17 DIAGNOSIS — K219 Gastro-esophageal reflux disease without esophagitis: Secondary | ICD-10-CM

## 2021-04-17 MED ORDER — OMEPRAZOLE 40 MG PO CPDR: 40.0000 mg | DELAYED_RELEASE_CAPSULE | Freq: Every day | ORAL | 3 refills | Status: DC

## 2021-04-17 MED ORDER — POLYETHYLENE GLYCOL 3350 17 GM/SCOOP PO POWD: 17.0000 g | Freq: Every day | ORAL | 3 refills | Status: DC

## 2021-04-17 MED ORDER — PEG 3350-KCL-NA BICARB-NACL 420 G PO SOLR: 4000.0000 mL | Freq: Once | ORAL | 0 refills | Status: AC

## 2021-04-17 NOTE — Patient Instructions (Addendum)
Increase Miralax to 3 capfuls every day Start omeprazole 40 mg qday

## 2021-04-17 NOTE — Progress Notes (Addendum)
[name], [name] Gastroenterology & Hepatology [facility] [address] [place], [place] [address]  Primary Care Physician: Rosita Fire, [place] [id number]  I will communicate my assessment and recommendations to the referring MD via EMR.  Problems: Opiate induced Constipation Esophageal dysmotility  History of Present Illness: [name] is a 80 y.o. male  with past medical history of esophageal dysmotility, diabetes, GERD complicated by Schatzki's ring, hypertension, opiate induced constipation, esophageal dysmotility, hyperlipidemia and OSA,  who presents for follow up of constipation.  The patient was last seen on 10/25/2020. At that time, the patient was advised to start taking MiraLAX on a daily basis.  Patient reports that he was taking Miralax 2 capfuls and had some small bowel movements every day with this regimen. He reports he ran out of the medication on Tuesday and his constipation worsened since then. He reports that he has presented some abdominal pain in his upper abdomen, which he feels gets better after defecating. He has felt worsening constipation as he ran out of the Miralax, and has had to strain significantly to move his bowels.  He has presented intermittent episodes of heartburn for the last year, which have worsened for the last few weeks. He has episodes of heartburn and mucusy sensation in his chest through the day, takes some Peptobismol occasionally for heartburn. No odynophagia, sometimes has some dysphagia but nothing severe as he denies any food impaction episodes of food always moves down to his stomach.  Takes oxycodone 3 times a day on average.  The patient denies having any nausea, vomiting, fever, chills, hematochezia, melena, hematemesis, diarrhea, jaundice, pruritus or weight loss.  Last EGD: 02/10/2020, esophagus was normal, and empiric dilation via  [facility] dilator was performed up to 45 [place], there was no presence of mucosal disruption.  Stomach and small bowel were normal.   Last Colonoscopy: 2020 - - The entire examined colon is normal. - Internal hemorrhoids. - No specimens collected.  Past Medical History: Past Medical History:  Diagnosis Date   Arthritis    fingers, back   Chronic back pain    Diabetes mellitus Since 1995   Takes Glucovance and Onglyza daily   Diabetes mellitus without complication ([place])    GERD (gastroesophageal reflux disease)    takes Dexilant and Omeprazole daily   Herpes    History of colon polyps    History of gastric ulcer 25+yrs ago   Hyperlipidemia Since 1995   takes Simvastatin daily   Hypertension Since 1995   takes Metoprolol and Ramipril daily   Hypertension    Pneumonia    hx of;as a child   Sleep apnea    doesn't use CPAP   Urinary frequency    Urinary urgency    takes Flomax daily    Past Surgical History: Past Surgical History:  Procedure Laterality Date   BACK SURGERY  1962   Lumbar   BACK SURGERY     bilateral cataract surgery     CARDIAC CATHETERIZATION  02/16/10   CERVICAL SPINE SURGERY     COLONOSCOPY     COLONOSCOPY N/A 04/23/2013   Procedure: COLONOSCOPY;  Surgeon: Rogene Houston, MD;  [facility];  Service: Endoscopy;  Laterality: N/A;  1030   COLONOSCOPY N/A 07/24/2018   Procedure: COLONOSCOPY;  Surgeon: Rogene Houston, MD;  [facility];  Service: Endoscopy;  Laterality: N/A;  8:30   ESOPHAGEAL DILATION N/A  02/10/2020   Procedure: ESOPHAGEAL DILATION;  Surgeon: Rogene Houston, MD;  [facility];  Service: Endoscopy;  Laterality: N/A;   ESOPHAGOGASTRODUODENOSCOPY N/A 12/24/2014   Procedure: ESOPHAGOGASTRODUODENOSCOPY (EGD);  Surgeon: Rogene Houston, MD;  [facility];  Service: Endoscopy;  Laterality: N/A;  2:45   ESOPHAGOGASTRODUODENOSCOPY (EGD) WITH ESOPHAGEAL DILATION     ESOPHAGOGASTRODUODENOSCOPY (EGD) WITH  PROPOFOL N/A 02/10/2020   Procedure: ESOPHAGOGASTRODUODENOSCOPY (EGD) WITH PROPOFOL;  Surgeon: Rogene Houston, MD;  [facility];  Service: Endoscopy;  Laterality: N/A;  250   HAND SURGERY     Right   LUMBAR LAMINECTOMY/DECOMPRESSION MICRODISCECTOMY N/A 08/29/2012   Procedure: LUMBAR LAMINECTOMY/DECOMPRESSION MICRODISCECTOMY 1 LEVEL;  Surgeon: Floyce Stakes, MD;  Location: [place] [facility];  Service: Neurosurgery;  Laterality: N/A;  Lumbar four-five laminectomies   NECK SURGERY     PENILE PROSTHESIS IMPLANT     ROTATOR CUFF REPAIR     Right   SHOULDER SURGERY     TRANSURETHRAL RESECTION OF PROSTATE N/A 05/25/2019   Procedure: TRANSURETHRAL RESECTION OF THE PROSTATE (TURP);  Surgeon: Cleon Gustin, MD;  [facility];  Service: Urology;  Laterality: N/A;   WRIST SURGERY     Left   WRIST SURGERY      Family History: Family History  Problem Relation Age of Onset   Heart attack Father 47       MI   Hypertension Father    Heart attack Brother 52       MI   Diabetes Mother    Heart attack Paternal Uncle     Social History: Social History   Tobacco Use  Smoking Status Never  Smokeless Tobacco Never   Social History   Substance and Sexual Activity  Alcohol Use Not Currently   Comment: occasionally    Social History   Substance and Sexual Activity  Drug Use Yes   Frequency: 3.0 times per week   Types: Marijuana    Allergies: Allergies  Allergen Reactions   Aspirin Other (See Comments)    Higher dosage upsets stomach   Bayer Aspirin [Aspirin] Other (See Comments)    Stomach "flares up"    Medications: Current Outpatient Medications  Medication Sig Dispense Refill   acyclovir (ZOVIRAX) 200 MG capsule Take 200 mg by mouth as needed.      amLODipine (NORVASC) 10 MG tablet Take 10 mg by mouth daily.     Blood Glucose Monitoring Suppl (ONETOUCH VERIO FLEX SYSTEM) w/Device KIT USE TO TEST TWICE DAILY     cetirizine (ZYRTEC) 10 MG tablet Take 10 mg  by mouth daily.     glyBURIDE-metformin (GLUCOVANCE) 5-500 MG per tablet Take 2 tablets by mouth 2 (two) times daily with a meal.      labetalol (NORMODYNE) 200 MG tablet Take 200 mg by mouth 2 (two) times daily.     Lancets (ONETOUCH DELICA PLUS [id number]) MISC USE TO TEST TWICE DAILY     metoprolol succinate (TOPROL-XL) 50 MG 24 hr tablet Take 50 mg by mouth daily. Take with or immediately following a meal.     naproxen (NAPROSYN) 500 MG tablet Take 500 mg by mouth 2 (two) times daily with a meal.     ONETOUCH VERIO test strip USE TO TEST TWICE DAILY     oxyCODONE (OXY IR/ROXICODONE) 5 MG immediate release tablet Take 5 mg by mouth 3 (three) times daily. For pain     ramipril (ALTACE) 10 MG capsule Take 10 mg  by mouth daily.      simvastatin (ZOCOR) 20 MG tablet Take 20 mg by mouth daily.     polyethylene glycol powder (GLYCOLAX) 17 GM/SCOOP powder Take 17 g by mouth daily. (Patient not taking: Reported on 04/17/2021) 850 g 1   No current facility-administered medications for this visit.    Review of Systems: GENERAL: negative for malaise, night sweats HEENT: No changes in hearing or vision, no nose bleeds or other nasal problems. NECK: Negative for lumps, goiter, pain and significant neck swelling RESPIRATORY: Negative for cough, wheezing CARDIOVASCULAR: Negative for chest pain, leg swelling, palpitations, orthopnea GI: SEE HPI MUSCULOSKELETAL: Negative for joint pain or swelling, back pain, and muscle pain. SKIN: Negative for lesions, rash PSYCH: Negative for sleep disturbance, mood disorder and recent psychosocial stressors. HEMATOLOGY Negative for prolonged bleeding, bruising easily, and swollen nodes. ENDOCRINE: Negative for cold or heat intolerance, polyuria, polydipsia and goiter. NEURO: negative for tremor, gait imbalance, syncope and seizures. The remainder of the review of systems is noncontributory.   Physical Exam: BP 114/70 (BP Location: Right Arm, Patient Position:  Sitting, Cuff Size: Small)   Pulse 66   Temp 97.8 F (36.6 C) (Oral)   Ht 5' 10" (1.778 m)   Wt 154 lb 14.4 oz (70.3 kg)   BMI 22.23 kg/m  GENERAL: The patient is AO x3, in no acute distress. HEENT: Head is normocephalic and atraumatic. EOMI are intact. Mouth is well hydrated and without lesions. NECK: Supple. No masses LUNGS: Clear to auscultation. No presence of rhonchi/wheezing/rales. Adequate chest expansion HEART: RRR, normal s1 and s2. ABDOMEN: Soft, nontender, no guarding, no peritoneal signs, and nondistended. BS +. No masses. EXTREMITIES: Without any cyanosis, clubbing, rash, lesions or edema. NEUROLOGIC: AOx3, no focal motor deficit. SKIN: no jaundice, no rashes  Imaging/Labs: as above  I personally reviewed and interpreted the available labs, imaging and endoscopic files.  Impression and Plan: [name] is a 80 y.o. male  with past medical history of esophageal dysmotility, diabetes, GERD complicated by Schatzki's ring, hypertension, opiate induced constipation, esophageal dysmotility, hyperlipidemia and OSA,  who presents for follow up of constipation.  The patient presented some improvement of his constipation with the use of MiraLAX but unfortunately he ran out of this medication.  I advised him to restart it and to increase the dosage to 3 capfuls every day.  I explained that this constipation is likely related to his chronic opiate use.  We may need to add another agent if the over-the-counter medications do not improve his symptomatology.  We will also give her a bowel prep to empty his bowels more effectively.  In terms of his recurrent heartburn episodes, he is no longer taking any PPI.  I will restart him on omeprazole 40 mg every day.  He has not present any red flag signs and had an unremarkable EGD on 2021 for dysphagia.  It is very likely his dysphagia is related to a chronic opiate use but since his symptoms are very mild, I only encouraged him to try to  decrease the intake of opiates as much as possible.  -Bowel prep x1 - Increase Miralax to 3 capfuls every day - Start omeprazole 40 mg qday  All questions were answered.      Harvel Quale, MD Gastroenterology and Hepatology [facility]

## 2021-05-17 ENCOUNTER — Ambulatory Visit: Payer: Medicare HMO | Admitting: Urology

## 2021-06-07 ENCOUNTER — Other Ambulatory Visit (HOSPITAL_COMMUNITY): Payer: Self-pay | Admitting: Internal Medicine

## 2021-06-07 ENCOUNTER — Other Ambulatory Visit: Payer: Self-pay | Admitting: Internal Medicine

## 2021-06-07 DIAGNOSIS — R55 Syncope and collapse: Secondary | ICD-10-CM

## 2021-06-16 ENCOUNTER — Ambulatory Visit (HOSPITAL_COMMUNITY)
Admission: RE | Admit: 2021-06-16 | Discharge: 2021-06-16 | Disposition: A | Discharge status: Home / Self Care | Payer: Medicare HMO | Source: Ambulatory Visit | Attending: Internal Medicine | Admitting: Internal Medicine

## 2021-06-16 ENCOUNTER — Other Ambulatory Visit: Payer: Self-pay

## 2021-06-16 DIAGNOSIS — R55 Syncope and collapse: Secondary | ICD-10-CM | POA: Insufficient documentation

## 2021-06-18 ENCOUNTER — Emergency Department (HOSPITAL_COMMUNITY)
Admission: EM | Admit: 2021-06-18 | Discharge: 2021-06-18 | Disposition: A | Discharge status: Home / Self Care | Payer: Medicare HMO | Attending: Emergency Medicine | Admitting: Emergency Medicine

## 2021-06-18 ENCOUNTER — Other Ambulatory Visit: Payer: Self-pay

## 2021-06-18 ENCOUNTER — Encounter (HOSPITAL_COMMUNITY): Payer: Self-pay | Admitting: Emergency Medicine

## 2021-06-18 ENCOUNTER — Emergency Department (HOSPITAL_COMMUNITY): Payer: Medicare HMO

## 2021-06-18 DIAGNOSIS — Z7984 Long term (current) use of oral hypoglycemic drugs: Secondary | ICD-10-CM | POA: Diagnosis not present

## 2021-06-18 DIAGNOSIS — M25511 Pain in right shoulder: Secondary | ICD-10-CM | POA: Insufficient documentation

## 2021-06-18 DIAGNOSIS — Z79899 Other long term (current) drug therapy: Secondary | ICD-10-CM | POA: Diagnosis not present

## 2021-06-18 DIAGNOSIS — J209 Acute bronchitis, unspecified: Secondary | ICD-10-CM | POA: Diagnosis not present

## 2021-06-18 DIAGNOSIS — J40 Bronchitis, not specified as acute or chronic: Secondary | ICD-10-CM

## 2021-06-18 DIAGNOSIS — I10 Essential (primary) hypertension: Secondary | ICD-10-CM | POA: Insufficient documentation

## 2021-06-18 DIAGNOSIS — E119 Type 2 diabetes mellitus without complications: Secondary | ICD-10-CM | POA: Diagnosis not present

## 2021-06-18 DIAGNOSIS — H9201 Otalgia, right ear: Secondary | ICD-10-CM | POA: Insufficient documentation

## 2021-06-18 DIAGNOSIS — R059 Cough, unspecified: Secondary | ICD-10-CM | POA: Diagnosis present

## 2021-06-18 DIAGNOSIS — R519 Headache, unspecified: Secondary | ICD-10-CM | POA: Insufficient documentation

## 2021-06-18 MED ORDER — FLUTICASONE PROPIONATE 50 MCG/ACT NA SUSP
1.0000 | Freq: Every day | NASAL | 2 refills | Status: DC
Start: 2021-06-18 — End: 2021-10-16

## 2021-06-18 MED ORDER — DOXYCYCLINE HYCLATE 100 MG PO CAPS
100.0000 mg | ORAL_CAPSULE | Freq: Two times a day (BID) | ORAL | 0 refills | Status: DC
Start: 2021-06-18 — End: 2021-10-16

## 2021-06-18 NOTE — ED Provider Notes (Signed)
[facility] EMERGENCY DEPARTMENT Provider Note   CSN: [id number] Arrival date & time: 06/18/21  8101     History No chief complaint on file.   [name] is a 80 y.o. male.  Patient presents to the emergency department with complaints of cough, chest congestion, right ear pain, nasal congestion and sinus pain that has been ongoing for a month.  He reports that he has been experiencing intermittent headaches.  Reports that he coughs a lot when he wakes up every morning, takes him a while to be able to breathe right.  The other day he coughed something up and felt like it got stuck on the right side of his throat.  Still feels funny when he swallows.  He also has been having pain in the right arm and shoulder area.      Past Medical History:  Diagnosis Date   Arthritis    fingers, back   Chronic back pain    Diabetes mellitus Since 1995   Takes Glucovance and Onglyza daily   Diabetes mellitus without complication ([place])    GERD (gastroesophageal reflux disease)    takes Dexilant and Omeprazole daily   Herpes    History of colon polyps    History of gastric ulcer 25+yrs ago   Hyperlipidemia Since 1995   takes Simvastatin daily   Hypertension Since 1995   takes Metoprolol and Ramipril daily   Hypertension    Pneumonia    hx of;as a child   Sleep apnea    doesn't use CPAP   Urinary frequency    Urinary urgency    takes Flomax daily    Patient Active Problem List   Diagnosis Date Noted   Constipation 10/25/2020   Urinary retention 02/03/2020   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   Weak urinary stream 09/30/2019   BPH (benign prostatic hyperplasia) 05/25/2019   Dysphagia 11/24/2018   History of colonic polyps 04/24/2018   Dilated cbd, acquired 02/18/2012   GERD (gastroesophageal reflux disease) 09/11/2011   High cholesterol 09/11/2011   Bronchitis 09/11/2011   DM 02/13/2010   ESSENTIAL HYPERTENSION, BENIGN 02/13/2010   PRECORDIAL PAIN  02/13/2010    Past Surgical History:  Procedure Laterality Date   BACK SURGERY  1962   Lumbar   BACK SURGERY     bilateral cataract surgery     CARDIAC CATHETERIZATION  02/16/10   CERVICAL SPINE SURGERY     COLONOSCOPY     COLONOSCOPY N/A 04/23/2013   Procedure: COLONOSCOPY;  Surgeon: Rogene Houston, MD;  [facility];  Service: Endoscopy;  Laterality: N/A;  1030   COLONOSCOPY N/A 07/24/2018   Procedure: COLONOSCOPY;  Surgeon: Rogene Houston, MD;  Location: [facility];  Service: Endoscopy;  Laterality: N/A;  8:30   ESOPHAGEAL DILATION N/A 02/10/2020   Procedure: ESOPHAGEAL DILATION;  Surgeon: Rogene Houston, MD;  Location: [facility];  Service: Endoscopy;  Laterality: N/A;   ESOPHAGOGASTRODUODENOSCOPY N/A 12/24/2014   Procedure: ESOPHAGOGASTRODUODENOSCOPY (EGD);  Surgeon: Rogene Houston, MD;  Location: [facility];  Service: Endoscopy;  Laterality: N/A;  2:45   ESOPHAGOGASTRODUODENOSCOPY (EGD) WITH ESOPHAGEAL DILATION     ESOPHAGOGASTRODUODENOSCOPY (EGD) WITH PROPOFOL N/A 02/10/2020   Procedure: ESOPHAGOGASTRODUODENOSCOPY (EGD) WITH PROPOFOL;  Surgeon: Rogene Houston, MD;  [facility];  Service: Endoscopy;  Laterality: N/A;  250   HAND SURGERY     Right   LUMBAR LAMINECTOMY/DECOMPRESSION MICRODISCECTOMY N/A 08/29/2012   Procedure: LUMBAR LAMINECTOMY/DECOMPRESSION MICRODISCECTOMY 1 LEVEL;  Surgeon: Floyce Stakes, MD;  Location: [facility] NEURO ORS;  Service: Neurosurgery;  Laterality: N/A;  Lumbar four-five laminectomies   NECK SURGERY     PENILE PROSTHESIS IMPLANT     ROTATOR CUFF REPAIR     Right   SHOULDER SURGERY     TRANSURETHRAL RESECTION OF PROSTATE N/A 05/25/2019   Procedure: TRANSURETHRAL RESECTION OF THE PROSTATE (TURP);  Surgeon: Cleon Gustin, MD;  Location: [facility] ORS;  Service: Urology;  Laterality: N/A;   WRIST SURGERY     Left   WRIST SURGERY         Family History  Problem Relation Age of Onset   Heart attack Father 28       MI    Hypertension Father    Heart attack Brother 29       MI   Diabetes Mother    Heart attack Paternal Uncle     Social History   Tobacco Use   Smoking status: Never   Smokeless tobacco: Never  Vaping Use   Vaping Use: Never used  Substance Use Topics   Alcohol use: Not Currently    Comment: occasionally    Drug use: Yes    Frequency: 3.0 times per week    Types: Marijuana    Home Medications Prior to Admission medications   Medication Sig Start Date End Date Taking? Authorizing Provider  doxycycline (VIBRAMYCIN) 100 MG capsule Take 1 capsule (100 mg total) by mouth 2 (two) times daily. 06/18/21  Yes Adrien Shankar, Gwenyth Allegra, MD  fluticasone (FLONASE) 50 MCG/ACT nasal spray Place 1 spray into both nostrils daily. 06/18/21  Yes Maxton Noreen, Gwenyth Allegra, MD  acyclovir (ZOVIRAX) 200 MG capsule Take 200 mg by mouth as needed.  01/07/14   [provider]  amLODipine (NORVASC) 10 MG tablet Take 10 mg by mouth daily.    [provider]  Blood Glucose Monitoring Suppl ([place]) w/Device KIT USE TO TEST TWICE DAILY 09/30/19   [provider]  cetirizine (ZYRTEC) 10 MG tablet Take 10 mg by mouth daily. 01/12/20   [provider]  glyBURIDE-metformin (GLUCOVANCE) 5-500 MG per tablet Take 2 tablets by mouth 2 (two) times daily with a meal.     [provider]  labetalol (NORMODYNE) 200 MG tablet Take 200 mg by mouth 2 (two) times daily.    [provider]  Lancets (ONETOUCH DELICA PLUS [id number]) [place] USE TO TEST TWICE DAILY 09/30/19   [provider]  metoprolol succinate (TOPROL-XL) 50 MG 24 hr tablet Take 50 mg by mouth daily. Take with or immediately following a meal.    [provider]  naproxen (NAPROSYN) 500 MG tablet Take 500 mg by mouth 2 (two) times daily with a meal.    [provider]  omeprazole (PRILOSEC) 40 MG capsule Take 1 capsule (40 mg total) by mouth daily. 04/17/21   Harvel Quale, MD  [facility] VERIO test strip USE TO TEST TWICE DAILY 09/30/19   [provider]  oxyCODONE (OXY IR/ROXICODONE) 5 MG immediate release tablet Take 5 mg by mouth 3 (three) times daily. For pain 06/16/13   [provider]  polyethylene glycol powder (GLYCOLAX) 17 GM/SCOOP powder Take 17 g by mouth daily. 04/17/21   Harvel Quale, MD  ramipril (ALTACE) 10 MG capsule Take 10 mg by mouth daily.  02/05/14   [provider]  simvastatin (ZOCOR) 20 MG tablet Take 20 mg by mouth daily. 04/15/13   [provider]  bismuth-metronidazole-tetracycline (PLYERA) 140-125-125 MG per capsule Take 3 capsules by mouth 4 (four) times daily -  before meals and at bedtime.  09/26/11  [provider]    Allergies    Aspirin and Bayer aspirin [aspirin]  Review of Systems   Review of Systems  HENT:  Positive for congestion.   Respiratory:  Positive for cough.   Musculoskeletal:  Positive for arthralgias.  Neurological:  Positive for headaches.  All other systems reviewed and are negative.  Physical Exam Updated Vital Signs BP 107/65 (BP Location: Right Arm)   Pulse 70   Temp 98.1 F (36.7 C) (Oral)   Resp 18   Ht _0  (1.778 m)   Wt 72.6 kg   SpO2 99%   BMI 22.96 kg/m   Physical Exam Vitals and nursing note reviewed.  Constitutional:      General: He is not in acute distress.    Appearance: Normal appearance. He is well-developed.  HENT:     Head: Normocephalic and atraumatic.     Right Ear: Hearing normal.     Left Ear: Hearing normal.     Nose: Congestion present.  Eyes:     Conjunctiva/sclera: Conjunctivae normal.     Pupils: Pupils are equal, round, and reactive to light.  Cardiovascular:     Rate and Rhythm: Regular rhythm.     Heart sounds: S1 normal and S2 normal. No murmur heard.   No friction rub. No gallop.  Pulmonary:     Effort: Pulmonary effort is normal. No respiratory distress.     Breath sounds: Normal  breath sounds.  Chest:     Chest wall: No tenderness.  Abdominal:     General: Bowel sounds are normal.     Palpations: Abdomen is soft.     Tenderness: There is no abdominal tenderness. There is no guarding or rebound. Negative signs include Murphy's sign and McBurney's sign.     Hernia: No hernia is present.  Musculoskeletal:        General: Normal range of motion.     Right shoulder: Tenderness present. No swelling or deformity. Normal range of motion.     Cervical back: Normal range of motion and neck supple.  Skin:    General: Skin is warm and dry.     Findings: No rash.  Neurological:     Mental Status: He is alert and oriented to person, place, and time.     GCS: GCS eye subscore is 4. GCS verbal subscore is 5. GCS motor subscore is 6.     Cranial Nerves: No cranial nerve deficit.     Sensory: No sensory deficit.     Coordination: Coordination normal.  Psychiatric:        Speech: Speech normal.        Behavior: Behavior normal.        Thought Content: Thought content normal.    ED Results / Procedures / Treatments   Labs (all labs ordered are listed, but only abnormal results are displayed) Labs Reviewed - No data to display  EKG None  Radiology CT HEAD WO CONTRAST (5MM)  Result Date: 06/18/2021 CLINICAL DATA:  Headache. EXAM: CT HEAD WITHOUT CONTRAST TECHNIQUE: Contiguous axial images were obtained from the base of the skull through the vertex without intravenous contrast. COMPARISON:  Nov 16, 2019. FINDINGS: Brain: No evidence of acute infarction, hemorrhage, hydrocephalus, extra-axial collection or mass lesion/mass effect. Vascular: No hyperdense vessel or unexpected calcification. Skull: Normal. Negative for fracture  or focal lesion. Sinuses/Orbits: No acute finding. Other: None. IMPRESSION: No acute intracranial abnormality seen. Electronically Signed   By: Marijo Conception [name]   On: 06/18/2021 05:10   US Carotid Bilateral  Result Date: 06/16/2021 CLINICAL DATA:   Syncope and history hypertension and hyperlipidemia. EXAM: BILATERAL CAROTID DUPLEX ULTRASOUND TECHNIQUE: Pearline Cables scale imaging, color Doppler and duplex ultrasound were performed of bilateral carotid and vertebral arteries in the neck. COMPARISON:  None. FINDINGS: Criteria: Quantification of carotid stenosis is based on velocity parameters that correlate the residual internal carotid diameter with NASCET-based stenosis levels, using the diameter of the distal internal carotid lumen as the denominator for stenosis measurement. The following velocity measurements were obtained: RIGHT ICA:  54/13 cm/sec CCA:  284/13 cm/sec SYSTOLIC ICA/CCA RATIO:  0.5 ECA:  69 cm/sec LEFT ICA:  59/9 cm/sec CCA:  24/40 cm/sec SYSTOLIC ICA/CCA RATIO:  0.8 ECA:  68 cm/sec RIGHT CAROTID ARTERY: Mild amount of partially calcified plaque is present at the level of the carotid bulb. No evidence of right ICA plaque or stenosis. RIGHT VERTEBRAL ARTERY: Antegrade flow with normal waveform and velocity. LEFT CAROTID ARTERY: Mild to moderate partially calcified plaque at the level of the carotid bulb and proximal left ICA. Estimated left ICA stenosis is less than 50%. LEFT VERTEBRAL ARTERY: Antegrade flow with normal waveform and velocity. IMPRESSION: Plaque at the level of both carotid bulbs and the proximal left ICA. No significant ICA stenosis identified with no evidence of right ICA stenosis and estimated less than 50% left ICA stenosis. Electronically Signed   By: Aletta Edouard [name]   On: 06/16/2021 11:51    Procedures Procedures   Medications Ordered in ED Medications - No data to display  ED Course  I have reviewed the triage vital signs and the nursing notes.  Pertinent labs & imaging results that were available during my care of the patient were reviewed by me and considered in my medical decision making (see chart for details).    MDM Rules/Calculators/A&P                           Patient presents with multiple  complaints.  Patient is here primarily because his right ear hurts and he has a sore throat.  This has been ongoing for some time.  Patient reports a cough and chest congestion, worse in the mornings when he wakes up.  His lungs are clear.  Oxygenation is 98%.  Vitals are all normal.  Right ear exam is normal.  Tympanic membranes are normal bilaterally.  Is in also complaining of headache.  This is occurring intermittently.  He has a normal neurologic exam.  Head CT is unremarkable.  Patient complaining of right shoulder pain.  He does not have any associated sensory deficit, no weakness.  Patient has pain with range of motion.  This appears to be musculoskeletal in nature.  Final Clinical Impression(s) / ED Diagnoses Final diagnoses:  Bronchitis  Acute pain of right shoulder    Rx / DC Orders ED Discharge Orders          Ordered    doxycycline (VIBRAMYCIN) 100 MG capsule  2 times daily        06/18/21 0613    fluticasone (FLONASE) 50 MCG/ACT nasal spray  Daily        06/18/21 0613             Orpah Greek, MD 06/18/21 [phone]

## 2021-06-18 NOTE — ED Triage Notes (Signed)
Pt with multiple complaints: States has sinus pressure on L side, R earache, something bothering him with his throat, headaches occasionally, and R arm/shoulder pain/weakness.

## 2021-06-21 ENCOUNTER — Other Ambulatory Visit: Payer: Self-pay

## 2021-06-21 ENCOUNTER — Other Ambulatory Visit (HOSPITAL_COMMUNITY): Payer: Self-pay | Admitting: Internal Medicine

## 2021-06-21 ENCOUNTER — Ambulatory Visit (HOSPITAL_COMMUNITY)
Admission: RE | Admit: 2021-06-21 | Discharge: 2021-06-21 | Disposition: A | Discharge status: Home / Self Care | Payer: Medicare HMO | Source: Ambulatory Visit | Attending: Internal Medicine | Admitting: Internal Medicine

## 2021-06-21 DIAGNOSIS — M25511 Pain in right shoulder: Secondary | ICD-10-CM | POA: Insufficient documentation

## 2021-06-22 DIAGNOSIS — J3489 Other specified disorders of nose and nasal sinuses: Secondary | ICD-10-CM | POA: Insufficient documentation

## 2021-06-23 ENCOUNTER — Ambulatory Visit (INDEPENDENT_AMBULATORY_CARE_PROVIDER_SITE_OTHER): Payer: Medicare HMO | Admitting: Urology

## 2021-06-23 ENCOUNTER — Other Ambulatory Visit: Payer: Self-pay | Admitting: Urology

## 2021-06-23 ENCOUNTER — Other Ambulatory Visit: Payer: Self-pay

## 2021-06-23 VITALS — BP 143/70 | HR 77

## 2021-06-23 DIAGNOSIS — T83490A Other mechanical complication of penile (implanted) prosthesis, initial encounter: Secondary | ICD-10-CM

## 2021-06-23 DIAGNOSIS — N5201 Erectile dysfunction due to arterial insufficiency: Secondary | ICD-10-CM | POA: Diagnosis not present

## 2021-06-23 NOTE — Progress Notes (Signed)

## 2021-06-23 NOTE — Progress Notes (Signed)
06/23/2021 11:08 AM   [name] [birth year] [id number]  Referring provider: Rosita Fire, MD [place],   [id number]  Erectile dysfunction   HPI: [name] is a 80yo here for followup for erectile dysfunction. He had a IPP placed by Dr. Michela Pitcher 12-14 years ago and was working well until 6 months ago when the pump became difficult to pump and the device will not inflate. He denies any pain around his pump and no new swelling of his scrotum. No other complaints today.   PMH: Past Medical History:  Diagnosis Date   Arthritis    fingers, back   Chronic back pain    Diabetes mellitus Since 1995   Takes Glucovance and Onglyza daily   Diabetes mellitus without complication ([place])    GERD (gastroesophageal reflux disease)    takes Dexilant and Omeprazole daily   Herpes    History of colon polyps    History of gastric ulcer 25+yrs ago   Hyperlipidemia Since 1995   takes Simvastatin daily   Hypertension Since 1995   takes Metoprolol and Ramipril daily   Hypertension    Pneumonia    hx of;as a child   Sleep apnea    doesn't use CPAP   Urinary frequency    Urinary urgency    takes Flomax daily    Surgical History: Past Surgical History:  Procedure Laterality Date   BACK SURGERY  1962   Lumbar   BACK SURGERY     bilateral cataract surgery     CARDIAC CATHETERIZATION  02/16/10   CERVICAL SPINE SURGERY     COLONOSCOPY     COLONOSCOPY N/A 04/23/2013   Procedure: COLONOSCOPY;  Surgeon: Rogene Houston, MD;  [facility];  Service: Endoscopy;  Laterality: N/A;  1030   COLONOSCOPY N/A 07/24/2018   Procedure: COLONOSCOPY;  Surgeon: Rogene Houston, MD;  [facility];  Service: Endoscopy;  Laterality: N/A;  8:30   ESOPHAGEAL DILATION N/A 02/10/2020   Procedure: ESOPHAGEAL DILATION;  Surgeon: Rogene Houston, MD;  [facility];  Service: Endoscopy;  Laterality: N/A;   ESOPHAGOGASTRODUODENOSCOPY N/A 12/24/2014    Procedure: ESOPHAGOGASTRODUODENOSCOPY (EGD);  Surgeon: Rogene Houston, MD;  [facility];  Service: Endoscopy;  Laterality: N/A;  2:45   ESOPHAGOGASTRODUODENOSCOPY (EGD) WITH ESOPHAGEAL DILATION     ESOPHAGOGASTRODUODENOSCOPY (EGD) WITH PROPOFOL N/A 02/10/2020   Procedure: ESOPHAGOGASTRODUODENOSCOPY (EGD) WITH PROPOFOL;  Surgeon: Rogene Houston, MD;  [facility];  Service: Endoscopy;  Laterality: N/A;  250   HAND SURGERY     Right   LUMBAR LAMINECTOMY/DECOMPRESSION MICRODISCECTOMY N/A 08/29/2012   Procedure: LUMBAR LAMINECTOMY/DECOMPRESSION MICRODISCECTOMY 1 LEVEL;  Surgeon: Floyce Stakes, MD;  Location: [place] NEURO ORS;  Service: Neurosurgery;  Laterality: N/A;  Lumbar four-five laminectomies   NECK SURGERY     PENILE PROSTHESIS IMPLANT     ROTATOR CUFF REPAIR     Right   SHOULDER SURGERY     TRANSURETHRAL RESECTION OF PROSTATE N/A 05/25/2019   Procedure: TRANSURETHRAL RESECTION OF THE PROSTATE (TURP);  Surgeon: Cleon Gustin, MD;  Location: [facility];  Service: Urology;  Laterality: N/A;   WRIST SURGERY     Left   WRIST SURGERY      Home Medications:  Allergies as of 06/23/2021       Reactions   Aspirin Other (See Comments)   Higher dosage upsets stomach   Bayer Aspirin [aspirin] Other (See Comments)  Stomach "flares up"        Medication List        Accurate as of June 23, 2021 11:08 AM. If you have any questions, ask your nurse or doctor.          acyclovir 200 MG capsule Commonly known as: ZOVIRAX Take 200 mg by mouth as needed.   amLODipine 10 MG tablet Commonly known as: NORVASC Take 10 mg by mouth daily.   cetirizine 10 MG tablet Commonly known as: ZYRTEC Take 10 mg by mouth daily.   doxycycline 100 MG capsule Commonly known as: VIBRAMYCIN Take 1 capsule (100 mg total) by mouth 2 (two) times daily.   fluticasone 50 MCG/ACT nasal spray Commonly known as: FLONASE Place 1 spray into both nostrils daily.    glyBURIDE-metformin 5-500 MG tablet Commonly known as: GLUCOVANCE Take 2 tablets by mouth 2 (two) times daily with a meal.   labetalol 200 MG tablet Commonly known as: NORMODYNE Take 200 mg by mouth 2 (two) times daily.   metoprolol succinate 50 MG 24 hr tablet Commonly known as: TOPROL-XL Take 50 mg by mouth daily. Take with or immediately following a meal.   naproxen 500 MG tablet Commonly known as: NAPROSYN Take 500 mg by mouth 2 (two) times daily with a meal.   omeprazole 40 MG capsule Commonly known as: PRILOSEC Take 1 capsule (40 mg total) by mouth daily.   OneTouch Delica Plus [id number] Misc USE TO TEST TWICE DAILY   OneTouch Verio Flex System w/Device Kit USE TO TEST TWICE DAILY   OneTouch Verio test strip Generic drug: glucose blood USE TO TEST TWICE DAILY   oxyCODONE 5 MG immediate release tablet Commonly known as: Oxy IR/ROXICODONE Take 5 mg by mouth 3 (three) times daily. For pain   polyethylene glycol powder 17 GM/SCOOP powder Commonly known as: GlycoLax Take 17 g by mouth daily.   ramipril 10 MG capsule Commonly known as: ALTACE Take 10 mg by mouth daily.   simvastatin 20 MG tablet Commonly known as: ZOCOR Take 20 mg by mouth daily.        Allergies:  Allergies  Allergen Reactions   Aspirin Other (See Comments)    Higher dosage upsets stomach   Bayer Aspirin [Aspirin] Other (See Comments)    Stomach "flares up"    Family History: Family History  Problem Relation Age of Onset   Heart attack Father 28       MI   Hypertension Father    Heart attack Brother 34       MI   Diabetes Mother    Heart attack Paternal Uncle     Social History:  reports that he has never smoked. He has never used smokeless tobacco. He reports that he does not currently use alcohol. He reports current drug use. Frequency: 3.00 times per week. Drug: Marijuana.  ROS: All other review of systems were reviewed and are negative except what is noted above in  HPI  Physical Exam: BP (!) 143/70    Pulse 77   Constitutional:  Alert and oriented, No acute distress. HEENT: [place] AT, moist mucus membranes.  Trachea midline, no masses. Cardiovascular: No clubbing, cyanosis, or edema. Respiratory: Normal respiratory effort, no increased work of breathing. GI: Abdomen is soft, nontender, nondistended, no abdominal masses GU: No CVA tenderness. Circumcised phallus. No masses/lesions on penis, testis, scrotum. Prostate 40g smooth no nodules no induration.  Lymph: No cervical or inguinal lymphadenopathy. Skin: No rashes, bruises or suspicious lesions.  Neurologic: Grossly intact, no focal deficits, moving all 4 extremities. Psychiatric: Normal mood and affect.  Laboratory Data: Lab Results  Component Value Date   WBC 2.9 (L) 01/12/2021   HGB 12.2 (L) 01/12/2021   HCT 37.1 (L) 01/12/2021   MCV 95.4 01/12/2021   PLT 193 01/12/2021    Lab Results  Component Value Date   CREATININE 1.03 01/12/2021    No results found for: PSA  No results found for: TESTOSTERONE  Lab Results  Component Value Date   HGBA1C 8.5 (H) 01/12/2021    Urinalysis    Component Value Date/Time   COLORURINE YELLOW 06/19/2019 0827   APPEARANCEUR Clear 02/03/2020 1019   LABSPEC 1.014 06/19/2019 0827   PHURINE 5.0 06/19/2019 0827   GLUCOSEU Negative 02/03/2020 1019   HGBUR LARGE (A) 06/19/2019 0827   BILIRUBINUR Negative 02/03/2020 1019   KETONESUR NEGATIVE 06/19/2019 0827   PROTEINUR Negative 02/03/2020 1019   PROTEINUR 100 (A) 06/19/2019 0827   UROBILINOGEN 0.2 11/04/2019 1451   UROBILINOGEN 0.2 05/15/2014 0122   NITRITE Negative 02/03/2020 1019   NITRITE NEGATIVE 06/19/2019 0827   LEUKOCYTESUR Negative 02/03/2020 1019   LEUKOCYTESUR LARGE (A) 06/19/2019 0827    Lab Results  Component Value Date   LABMICR See below: 02/03/2020   WBCUA None seen 02/03/2020   LABEPIT 0-10 02/03/2020   BACTERIA None seen 02/03/2020    Pertinent Imaging:  No results found  for this or any previous visit.  No results found for this or any previous visit.  No results found for this or any previous visit.  No results found for this or any previous visit.  No results found for this or any previous visit.  No results found for this or any previous visit.  No results found for this or any previous visit.  Results for orders placed during the hospital encounter of 09/20/17  CT Renal Stone Study  Narrative CLINICAL DATA:  No new urinary tract stone. Low back pain for 2 weeks. Urinary retention and dysuria.  EXAM: CT ABDOMEN AND PELVIS WITHOUT CONTRAST  TECHNIQUE: Multidetector CT imaging of the abdomen and pelvis was performed following the standard protocol without IV contrast.  COMPARISON:  CT of the abdomen and pelvis 01/25/2012. Lumbar spine radiographs 04/24/2017.  FINDINGS: Lower chest: Excellent recall bronchiectasis is again noted at the lung bases bilaterally without superimposed airspace disease. The heart size is normal. No significant pleural or pericardial effusion is present.  Hepatobiliary: No focal liver abnormality is seen. No gallstones, gallbladder wall thickening, or biliary dilatation.  Pancreas: Unremarkable. No pancreatic ductal dilatation or surrounding inflammatory changes.  Spleen: Calcification along the lateral margin of the spleen is stable. No focal lesions are evident.  Adrenals/Urinary Tract: Adrenal glands are normal bilaterally. No stone or mass lesion is present. There is no obstruction. The ureters are within normal limits bilaterally. The urinary bladder is within normal limits.  Stomach/Bowel: The stomach and duodenum are within normal limits. Small bowel is unremarkable. The terminal ileum is within normal limits. The appendix is visualized and normal.  Vascular/Lymphatic: Atherosclerotic calcifications are present without aneurysm.  Reproductive: A penile prosthesis is noted. The prostate gland  is somewhat prominent and indents the inferior margin of the urinary bladder. It measures 4 cm in transverse diameter.  Other: No abdominal wall hernia or abnormality. No abdominopelvic ascites.  Musculoskeletal: Progressive endplate degenerative changes and sclerosis are present at L3-4, L4-5, and L5-S1. A transitional S1 segment is again noted. Foraminal narrowing is evident bilaterally at  L4-5 and L5-S1.  IMPRESSION: 1. No urinary tract obstruction or stone. 2. Prominent prostate gland indents the inferior border of the urinary bladder. 3. progressive degenerative changes in the lower lumbar spine. 4. Penile prosthesis.   Electronically Signed By: San Morelle [name] On: 09/20/2017 16:28   Assessment & Plan:    1.  Malfunction of penile prosthesis, initial encounter ([place]) -We discussed the management of a malfunctioning penile prosthesis including observation versus replacement and after discussing the options the patient elects for replacement. Risks/benefits/alternative discussed   No follow-ups on file.  Nicolette Bang, MD  [facility] [place]

## 2021-06-23 NOTE — Progress Notes (Signed)
Surgical Physician Order Form [facility]  * Scheduling expectation : Next Available  *Length of Case: 150 minutes  *MD Preforming Case: Nicolette Bang, MD  *Assistant Needed: no  *Facility Preference: [facility]  *Clearance needed: yes Dr. Legrand Rams  *Anticoagulation Instructions: Hold all anticoagulants  *Aspirin Instructions: Ok to continue Aspirin  -Admit type: Outpatient with extended recovery  -Anesthesia: General  -Use Standing Orders:  NA  *Diagnosis:  Erectile dysfunction  *Procedure:      removal and replacement of inflatable penile prosthesis, all components. AMS  Additional orders: AMS rep needs to be contacted and present  -Equipment: AMS lonestar retractor -VTE Prophylaxis Standing Order SCDs       Other:   -Standing Lab Orders Per Anesthesia    Lab other:  NA  -Standing Test orders EKG/Chest x-ray per Anesthesia       Test other:   - Medications:  Gentamicin per pharmacy, Vanc 1.5g  -Other orders:  Hibiclens Shower AM  *Post-op visit Date/Instructions:   2 weeks

## 2021-06-30 NOTE — Progress Notes (Signed)
[facility] Posting Form   Surgery Date/Time: Date: 07/13/2021  Surgeon: Dr. Nicolette Bang, MD  Surgery Location: Day Surgery  Inpt ( No  )   Outpt (No)   Obs ( Yes  )   Diagnosis: Erectile Dysfunction N52.01  -CPT: 28413,24401  Surgery: Removal and Replacement of inflatable penile prosthesis and all components  Stop Anticoagulations: Yes, may continue ASA  Cardiac/Medical/Pulmonary Clearance needed: no  *Orders entered into EPIC  Date: 06/30/21   *Case booked in [place]  Date: 06/29/2021  *Notified pt of Surgery: Date: 06/29/2021  *Placed into Prior Authorization Work Fabio Bering Date: 06/30/21   Assistant/laser/rep:Yes, AMS Rep needs to be present, contacted and set up with rep on 06/28/2021

## 2021-07-04 ENCOUNTER — Encounter: Payer: Self-pay | Admitting: Urology

## 2021-07-04 NOTE — Patient Instructions (Signed)
Erectile Dysfunction °Erectile dysfunction (ED) is the inability to get or keep an erection in order to have sexual intercourse. ED is considered a symptom of an underlying disorder and is not considered a disease. ED may include: °Inability to get an erection. °Lack of enough hardness of the erection to allow penetration. °Loss of erection before sex is finished. °What are the causes? °This condition may be caused by: °Physical causes, such as: °Artery problems. This may include heart disease, high blood pressure, atherosclerosis, and diabetes. °Hormonal problems, such as low testosterone. °Obesity. °Nerve problems. This may include back or pelvic injuries, multiple sclerosis, Parkinson's disease, spinal cord injury, and stroke. °Certain medicines, such as: °Pain relievers. °Antidepressants. °Blood pressure medicines and water pills (diuretics). °Cancer medicines. °Antihistamines. °Muscle relaxants. °Lifestyle factors, such as: °Use of drugs such as marijuana, cocaine, or opioids. °Excessive use of alcohol. °Smoking. °Lack of physical activity or exercise. °Psychological causes, such as: °Anxiety or stress. °Sadness or depression. °Exhaustion. °Fear about sexual performance. °Guilt. °What are the signs or symptoms? °Symptoms of this condition include: °Inability to get an erection. °Lack of enough hardness of the erection to allow penetration. °Loss of the erection before sex is finished. °Sometimes having normal erections, but with frequent unsatisfactory episodes. °Low sexual satisfaction in either partner due to erection problems. °A curved penis occurring with erection. The curve may cause pain, or the penis may be too curved to allow for intercourse. °Never having nighttime or morning erections. °How is this diagnosed? °This condition is often diagnosed by: °Performing a physical exam to find other diseases or specific problems with the penis. °Asking you detailed questions about the problem. °Doing tests,  such as: °Blood tests to check for diabetes mellitus or high cholesterol, or to measure hormone levels. °Other tests to check for underlying health conditions. °An ultrasound exam to check for scarring. °A test to check blood flow to the penis. °Doing a sleep study at home to measure nighttime erections. °How is this treated? °This condition may be treated by: °Medicines, such as: °Medicine taken by mouth to help you achieve an erection (oral medicine). °Hormone replacement therapy to replace low testosterone levels. °Medicine that is injected into the penis. Your health care provider may instruct you how to give yourself these injections at home. °Medicine that is delivered with a short applicator tube. The tube is inserted into the opening at the tip of the penis, which is the opening of the urethra. A tiny pellet of medicine is put in the urethra. The pellet dissolves and enhances erectile function. This is also called MUSE (medicated urethral system for erections) therapy. °Vacuum pump. This is a pump with a ring on it. The pump and ring are placed on the penis and used to create pressure that helps the penis become erect. °Penile implant surgery. In this procedure, you may receive: °An inflatable implant. This consists of cylinders, a pump, and a reservoir. The cylinders can be inflated with a fluid that helps to create an erection, and they can be deflated after intercourse. °A semi-rigid implant. This consists of two silicone rubber rods. The rods provide some rigidity. They are also flexible, so the penis can both curve downward in its normal position and become straight for sexual intercourse. °Blood vessel surgery to improve blood flow to the penis. During this procedure, a blood vessel from a different part of the body is placed into the penis to allow blood to flow around (bypass) damaged or blocked blood vessels. °Lifestyle changes,   such as exercising more, losing weight, and quitting smoking. °Follow  these instructions at home: °Medicines ° °Take over-the-counter and prescription medicines only as told by your health care provider. Do not increase the dosage without first discussing it with your health care provider. °If you are using self-injections, do injections as directed by your health care provider. Make sure you avoid any veins that are on the surface of the penis. After giving an injection, apply pressure to the injection site for 5 minutes. °Talk to your health care provider about how to prevent headaches while taking ED medicines. These medicines may cause a sudden headache due to the increase in blood flow in your body. °General instructions °Exercise regularly, as directed by your health care provider. Work with your health care provider to lose weight, if needed. °Do not use any products that contain nicotine or tobacco. These products include cigarettes, chewing tobacco, and vaping devices, such as e-cigarettes. If you need help quitting, ask your health care provider. °Before using a vacuum pump, read the instructions that come with the pump and discuss any questions with your health care provider. °Keep all follow-up visits. This is important. °Contact a health care provider if: °You feel nauseous. °You are vomiting. °You get sudden headaches while taking ED medicines. °You have any concerns about your sexual health. °Get help right away if: °You are taking oral or injectable medicines and you have an erection that lasts longer than 4 hours. If your health care provider is unavailable, go to the nearest emergency room for evaluation. An erection that lasts much longer than 4 hours can result in permanent damage to your penis. °You have severe pain in your groin or abdomen. °You develop redness or severe swelling of your penis. °You have redness spreading at your groin or lower abdomen. °You are unable to urinate. °You experience chest pain or a rapid heartbeat (palpitations) after taking oral  medicines. °These symptoms may represent a serious problem that is an emergency. Do not wait to see if the symptoms will go away. Get medical help right away. Call your local emergency services (911 in the [place] Do not drive yourself to the hospital. °Summary °Erectile dysfunction (ED) is the inability to get or keep an erection during sexual intercourse. °This condition is diagnosed based on a physical exam, your symptoms, and tests to determine the cause. Treatment varies depending on the cause and may include medicines, hormone therapy, surgery, or a vacuum pump. °You may need follow-up visits to make sure that you are using your medicines or devices correctly. °Get help right away if you are taking or injecting medicines and you have an erection that lasts longer than 4 hours. °This information is not intended to replace advice given to you by your health care provider. Make sure you discuss any questions you have with your health care provider. °Document Revised: 09/21/2020 Document Reviewed: 09/21/2020 °Elsevier Patient Education © 2022 [employer] ° °

## 2021-07-06 ENCOUNTER — Encounter (HOSPITAL_BASED_OUTPATIENT_CLINIC_OR_DEPARTMENT_OTHER): Payer: Self-pay | Admitting: Urology

## 2021-07-06 ENCOUNTER — Other Ambulatory Visit: Payer: Self-pay

## 2021-07-06 NOTE — Progress Notes (Signed)
Spoke w/ via phone for pre-op interview--- [facility]----  ISTAT,EKG             Lab results------ COVID test -----patient states asymptomatic no test needed Arrive at -------0930 NPO after MN NO Solid Food.   Med rec completed Medications to take morning of surgery -----Labetalol Diabetic medication ----- Do not take DM meds day of surgery, pt verbalized understanding. Patient instructed no nail polish to be worn day of surgery Patient instructed to bring photo id and insurance card day of surgery Patient aware to have Driver (ride ) / caregiver  [name]   for 24 hours after surgery  Patient Special Instructions ----- patient OWER, RCC guidelines discussed with patient. Questions answered, patient verbalized understanding. Pre-Op special Istructions ----- Patient verbalized understanding of instructions that were given at this phone interview. Patient denies shortness of breath, chest pain, fever, cough at this phone interview. Anesthesia Review:  PCP: Dr. Legrand Rams Cardiologist : Chest x-ray : EKG :  Echo : Stress test: Cardiac Cath :  Activity level:  Sleep Study/ CPAP : Has sleep apnea, does not wear CPAP. Fasting Blood Sugar :  150-191    / Checks Blood Sugar 1 times a day:   Blood Thinner/ Instructions /Last Dose: ASA / Instructions/ Last Dose :

## 2021-07-07 ENCOUNTER — Ambulatory Visit: Payer: Medicare HMO | Admitting: Orthopedic Surgery

## 2021-07-07 ENCOUNTER — Encounter: Payer: Self-pay | Admitting: Orthopedic Surgery

## 2021-07-07 VITALS — BP 148/84 | HR 78 | Ht 70.0 in | Wt 151.0 lb

## 2021-07-07 DIAGNOSIS — M7581 Other shoulder lesions, right shoulder: Secondary | ICD-10-CM

## 2021-07-07 MED ORDER — GABAPENTIN 100 MG PO CAPS: 100.0000 mg | ORAL_CAPSULE | Freq: Three times a day (TID) | ORAL | 0 refills | Status: DC

## 2021-07-07 NOTE — Progress Notes (Signed)
New Patient Visit  Assessment: [name] is a 80 y.o. RHD male with the following: 1. Tendinitis of right rotator cuff  Plan: Patient has a history of surgery on his right shoulder, over 10 years ago.  Over the past 3 months, he has progressively worsening pain, weakness and dysfunction of the right shoulder.  In addition, he has numbness and tingling radiating from his shoulder to his right hand.  It is possible that he also has some pathology within the cervical spine.  Nonetheless, at this point I have recommended treatment for her shoulder.  I have offered a steroid injection for his right shoulder, and he would like to proceed.  In addition, I will provide him with gabapentin for the numbness and tingling, and associated nerve type pain.  Home exercises provided. Follow-up as needed.  Procedure note injection - Right shoulder    Verbal consent was obtained to inject the right shoulder, subacromial space Timeout was completed to confirm the site of injection.   The skin was prepped with alcohol and ethyl chloride was sprayed at the injection site.  A 21-gauge needle was used to inject 40 mg of Depo-Medrol and 1% lidocaine (3 cc) into the subacromial space of the right shoulder using a posterolateral approach.  There were no complications.  A sterile bandage was applied.     Follow-up: Return if symptoms worsen or fail to improve.  Subjective:  Chief Complaint  Patient presents with   Shoulder Pain    RT Hurting x 3 mths Hx of RCS was fine until about 3 months ago/ NKI    History of Present Illness: [name] is a 80 y.o. male who has been referred to clinic today by Rosita Fire, MD for evaluation of right shoulder pain.  He has had pain in his right shoulder for at least the past 3 months.  He does have a history of right shoulder arthroscopy, completed greater than 10 years ago.  Since then he notes that he has had some weakness in the shoulder.  However,  over the past 3 months, he has had progressively worsening pain in the right shoulder.  In addition, he notes that about a week ago, he had difficulty lifting a coffee cup to his mouth.  He also has tingling pain into his right hand, radiating from shoulder.  This is also associated with some numbness.  He has difficulty with overhead motions.  No therapy.  He occasionally takes Tylenol, which helps with some of his pain.   Review of Systems: No fevers or chills + numbness and tingling No chest pain No shortness of breath No bowel or bladder dysfunction No GI distress No headaches   Medical History:  Past Medical History:  Diagnosis Date   Arthritis    fingers, back   Chronic back pain    Diabetes mellitus Since 1995   Takes Glucovance and Onglyza daily   Diabetes mellitus without complication ([place])    GERD (gastroesophageal reflux disease)    takes Dexilant and Omeprazole daily   Herpes    History of colon polyps    History of gastric ulcer 25+yrs ago   Hyperlipidemia Since 1995   takes Simvastatin daily   Hypertension Since 1995   takes Metoprolol and Ramipril daily   Hypertension    Pneumonia    hx of;as a child   Sleep apnea    doesn't use CPAP   Urinary frequency    Urinary urgency  takes Flomax daily    Past Surgical History:  Procedure Laterality Date   BACK SURGERY  1962   Lumbar   BACK SURGERY     bilateral cataract surgery     CARDIAC CATHETERIZATION  02/16/10   CERVICAL SPINE SURGERY     COLONOSCOPY     COLONOSCOPY N/A 04/23/2013   Procedure: COLONOSCOPY;  Surgeon: Rogene Houston, MD;  Location: [facility];  Service: Endoscopy;  Laterality: N/A;  1030   COLONOSCOPY N/A 07/24/2018   Procedure: COLONOSCOPY;  Surgeon: Rogene Houston, MD;  Location: [facility];  Service: Endoscopy;  Laterality: N/A;  8:30   ESOPHAGEAL DILATION N/A 02/10/2020   Procedure: ESOPHAGEAL DILATION;  Surgeon: Rogene Houston, MD;  Location: [facility];  Service:  Endoscopy;  Laterality: N/A;   ESOPHAGOGASTRODUODENOSCOPY N/A 12/24/2014   Procedure: ESOPHAGOGASTRODUODENOSCOPY (EGD);  Surgeon: Rogene Houston, MD;  [facility]: [facility];  Service: Endoscopy;  Laterality: N/A;  2:45   ESOPHAGOGASTRODUODENOSCOPY (EGD) WITH ESOPHAGEAL DILATION     ESOPHAGOGASTRODUODENOSCOPY (EGD) WITH PROPOFOL N/A 02/10/2020   Procedure: ESOPHAGOGASTRODUODENOSCOPY (EGD) WITH PROPOFOL;  Surgeon: Rogene Houston, MD;  [facility];  Service: Endoscopy;  Laterality: N/A;  250   HAND SURGERY     Right   LUMBAR LAMINECTOMY/DECOMPRESSION MICRODISCECTOMY N/A 08/29/2012   Procedure: LUMBAR LAMINECTOMY/DECOMPRESSION MICRODISCECTOMY 1 LEVEL;  Surgeon: Floyce Stakes, MD;  Location: [place] [facility];  Service: Neurosurgery;  Laterality: N/A;  Lumbar four-five laminectomies   NECK SURGERY     PENILE PROSTHESIS IMPLANT     ROTATOR CUFF REPAIR     Right   SHOULDER SURGERY     TRANSURETHRAL RESECTION OF PROSTATE N/A 05/25/2019   Procedure: TRANSURETHRAL RESECTION OF THE PROSTATE (TURP);  Surgeon: Cleon Gustin, MD;  Location: [facility];  Service: Urology;  Laterality: N/A;   WRIST SURGERY     Left   WRIST SURGERY      Family History  Problem Relation Age of Onset   Irwin attack Father 65       MI   Hypertension Father    Irwin attack Brother 60       MI   Diabetes Mother    Irwin attack Paternal Uncle    Social History   Tobacco Use   Smoking status: Never   Smokeless tobacco: Never  Vaping Use   Vaping Use: Never used  Substance Use Topics   Alcohol use: Not Currently    Comment: occasionally    Drug use: Yes    Frequency: 3.0 times per week    Types: Marijuana    Allergies  Allergen Reactions   Aspirin Other (See Comments)    Higher dosage upsets stomach   Bayer Aspirin [Aspirin] Other (See Comments)    Stomach "flares up"    Current Meds  Medication Sig   acyclovir (ZOVIRAX) 200 MG capsule Take 200 mg by mouth as needed.    amLODipine  (NORVASC) 10 MG tablet Take 10 mg by mouth daily.   Blood Glucose Monitoring Suppl (ONETOUCH VERIO FLEX SYSTEM) w/Device KIT USE TO TEST TWICE DAILY   cetirizine (ZYRTEC) 10 MG tablet Take 10 mg by mouth daily.   doxycycline (VIBRAMYCIN) 100 MG capsule Take 1 capsule (100 mg total) by mouth 2 (two) times daily.   fluticasone (FLONASE) 50 MCG/ACT nasal spray Place 1 spray into both nostrils daily.   gabapentin (NEURONTIN) 100 MG capsule Take 1 capsule (100 mg total) by mouth 3 (three) times  daily.   glyBURIDE-metformin (GLUCOVANCE) 5-500 MG per tablet Take 2 tablets by mouth 2 (two) times daily with a meal.    labetalol (NORMODYNE) 200 MG tablet Take 200 mg by mouth 2 (two) times daily.   Lancets (ONETOUCH DELICA PLUS [id number]) MISC USE TO TEST TWICE DAILY   metoprolol succinate (TOPROL-XL) 50 MG 24 hr tablet Take 50 mg by mouth daily. Take with or immediately following a meal.   naproxen (NAPROSYN) 500 MG tablet Take 500 mg by mouth 2 (two) times daily with a meal.   omeprazole (PRILOSEC) 40 MG capsule Take 1 capsule (40 mg total) by mouth daily.   ONETOUCH VERIO test strip USE TO TEST TWICE DAILY   oxyCODONE (OXY IR/ROXICODONE) 5 MG immediate release tablet Take 5 mg by mouth 3 (three) times daily. For pain   polyethylene glycol powder (GLYCOLAX) 17 GM/SCOOP powder Take 17 g by mouth daily.   ramipril (ALTACE) 10 MG capsule Take 10 mg by mouth daily.    simvastatin (ZOCOR) 20 MG tablet Take 20 mg by mouth daily.    Objective: BP (!) 148/84    Pulse 78    Ht $R'5\' 10"'cT$  (1.778 m)    Wt 151 lb (68.5 kg)    BMI 21.67 kg/m   Physical Exam:  General: Elderly male.  Oriented.  No acute distress. Gait: Slow, steady gait.  Evaluation of the right shoulder demonstrates some atrophy posteriorly.  Forward elevation to 130 degrees.  Internal rotation of the lumbar spine, with obvious discomfort.  Strength in the right shoulder rotator cuff tendons is 4/5.  Negative belly press.  Decreased sensation  throughout the right hand.  Mild atrophy of the first webspace musculature.  Fingers warm and well perfused.  IMAGING: I personally reviewed images previously obtained in clinic  X-rays of the right shoulder were previously obtained in clinic.  No acute injuries are noted.  No obvious proximal humeral migration.  Glenohumeral joint is reduced.  Minimal degenerative changes within the glenohumeral joint.   New Medications:  Meds ordered this encounter  Medications   gabapentin (NEURONTIN) 100 MG capsule    Sig: Take 1 capsule (100 mg total) by mouth 3 (three) times daily.    Dispense:  90 capsule    Refill:  0      Mordecai Rasmussen, MD  07/07/2021 9:03 AM

## 2021-07-07 NOTE — Patient Instructions (Addendum)

## 2021-07-13 ENCOUNTER — Ambulatory Visit (HOSPITAL_BASED_OUTPATIENT_CLINIC_OR_DEPARTMENT_OTHER): Payer: Medicare HMO | Admitting: Anesthesiology

## 2021-07-13 ENCOUNTER — Other Ambulatory Visit: Payer: Self-pay

## 2021-07-13 ENCOUNTER — Encounter (HOSPITAL_BASED_OUTPATIENT_CLINIC_OR_DEPARTMENT_OTHER): Payer: Self-pay | Admitting: Urology

## 2021-07-13 ENCOUNTER — Telehealth: Payer: Self-pay

## 2021-07-13 ENCOUNTER — Ambulatory Visit (HOSPITAL_BASED_OUTPATIENT_CLINIC_OR_DEPARTMENT_OTHER)
Admission: RE | Admit: 2021-07-13 | Discharge: 2021-07-13 | Disposition: A | Discharge status: Home / Self Care | Payer: Medicare HMO | Attending: Urology | Admitting: Urology

## 2021-07-13 ENCOUNTER — Encounter (HOSPITAL_BASED_OUTPATIENT_CLINIC_OR_DEPARTMENT_OTHER)
Admission: RE | Disposition: A | Discharge status: Home / Self Care | Payer: Self-pay | Source: Home / Self Care | Attending: Urology

## 2021-07-13 DIAGNOSIS — N5201 Erectile dysfunction due to arterial insufficiency: Secondary | ICD-10-CM | POA: Insufficient documentation

## 2021-07-13 DIAGNOSIS — I491 Atrial premature depolarization: Secondary | ICD-10-CM | POA: Diagnosis not present

## 2021-07-13 LAB — POCT I-STAT, CHEM 8
BUN: 24 mg/dL — ABNORMAL HIGH (ref 8–23)
Calcium, Ion: 1.26 mmol/L (ref 1.15–1.40)
Chloride: 100 mmol/L (ref 98–111)
Creatinine, Ser: 1.1 mg/dL (ref 0.61–1.24)
Glucose, Bld: 153 mg/dL — ABNORMAL HIGH (ref 70–99)
HCT: 41 % (ref 39.0–52.0)
Hemoglobin: 13.9 g/dL (ref 13.0–17.0)
Potassium: 4.2 mmol/L (ref 3.5–5.1)
Sodium: 139 mmol/L (ref 135–145)
TCO2: 27 mmol/L (ref 22–32)

## 2021-07-13 SURGERY — INSERTION, PENILE PROSTHESIS, INFLATABLE
Anesthesia: General

## 2021-07-13 MED ORDER — GENTAMICIN SULFATE 40 MG/ML IJ SOLN
340.0000 mg | INTRAVENOUS | Status: DC
  Filled 2021-07-13: qty 8.5

## 2021-07-13 MED ORDER — ACETAMINOPHEN 500 MG PO TABS: 1000.0000 mg | ORAL_TABLET | Freq: Once | ORAL | Status: DC

## 2021-07-13 MED ORDER — LACTATED RINGERS IV SOLN: INTRAVENOUS | Status: DC

## 2021-07-13 MED ORDER — ACETAMINOPHEN 500 MG PO TABS
ORAL_TABLET | ORAL | Status: AC
  Filled 2021-07-13: qty 2

## 2021-07-13 MED ORDER — VANCOMYCIN HCL 1500 MG/300ML IV SOLN
1500.0000 mg | INTRAVENOUS | Status: DC
  Filled 2021-07-13: qty 300

## 2021-07-13 SURGICAL SUPPLY — 56 items
ADH SKN CLS APL DERMABOND .7 (GAUZE/BANDAGES/DRESSINGS) ×1
APL PRP STRL LF DISP 70% ISPRP (MISCELLANEOUS) ×1
BAG DECANTER FOR FLEXI CONT (MISCELLANEOUS) ×2 IMPLANT
BAG DRN RND TRDRP ANRFLXCHMBR (UROLOGICAL SUPPLIES)
BAG URINE DRAIN 2000ML AR STRL (UROLOGICAL SUPPLIES) IMPLANT
BLADE SURG 15 STRL LF DISP TIS (BLADE) ×1 IMPLANT
BLADE SURG 15 STRL SS (BLADE) ×2
BNDG GAUZE ELAST 4 BULKY (GAUZE/BANDAGES/DRESSINGS) ×2 IMPLANT
BRUSH SCRUB EZ  4% CHG (MISCELLANEOUS) ×4
BRUSH SCRUB EZ 4% CHG (MISCELLANEOUS) ×2 IMPLANT
CATH FOLEY 2WAY SLVR  5CC 16FR (CATHETERS) ×2
CATH FOLEY 2WAY SLVR 5CC 16FR (CATHETERS) ×1 IMPLANT
CHLORAPREP W/TINT 26 (MISCELLANEOUS) ×2 IMPLANT
CLEANER CAUTERY TIP 5X5 PAD (MISCELLANEOUS) ×1 IMPLANT
COVER BACK TABLE 60X90IN (DRAPES) ×2 IMPLANT
COVER MAYO STAND STRL (DRAPES) ×4 IMPLANT
DERMABOND ADVANCED (GAUZE/BANDAGES/DRESSINGS) ×1
DERMABOND ADVANCED .7 DNX12 (GAUZE/BANDAGES/DRESSINGS) ×1 IMPLANT
DISSECTOR ROUND CHERRY 3/8 STR (MISCELLANEOUS) IMPLANT
DRAPE INCISE IOBAN 66X45 STRL (DRAPES) ×2 IMPLANT
DRAPE LAPAROTOMY 100X72 PEDS (DRAPES) ×2 IMPLANT
ELECT NDL BLADE 2-5/6 (NEEDLE) IMPLANT
ELECT NEEDLE BLADE 2-5/6 (NEEDLE) IMPLANT
ELECT REM PT RETURN 9FT ADLT (ELECTROSURGICAL) ×2
ELECTRODE REM PT RTRN 9FT ADLT (ELECTROSURGICAL) ×1 IMPLANT
GAUZE 4X4 16PLY ~~LOC~~+RFID DBL (SPONGE) ×2 IMPLANT
GLOVE SURG ENC MOIS LTX SZ8 (GLOVE) ×2 IMPLANT
GOWN STRL REUS W/TWL XL LVL3 (GOWN DISPOSABLE) ×2 IMPLANT
KIT TURNOVER CYSTO (KITS) ×2 IMPLANT
NS IRRIG 1000ML POUR BTL (IV SOLUTION) ×2 IMPLANT
NS IRRIG 500ML POUR BTL (IV SOLUTION) ×2 IMPLANT
PACK BASIN DAY SURGERY FS (CUSTOM PROCEDURE TRAY) ×2 IMPLANT
PAD CLEANER CAUTERY TIP 5X5 (MISCELLANEOUS) ×1
PANTS MESH DISP LRG (UNDERPADS AND DIAPERS) ×1 IMPLANT
PANTS MESH DISPOSABLE L (UNDERPADS AND DIAPERS) ×1
PENCIL SMOKE EVACUATOR (MISCELLANEOUS) ×2 IMPLANT
PLUG CATH AND CAP STER (CATHETERS) ×2 IMPLANT
RETRACTOR DEEP SCROTAL PENILE (MISCELLANEOUS) IMPLANT
SPONGE T-LAP 4X18 ~~LOC~~+RFID (SPONGE) IMPLANT
SUPPORT SCROTAL LG STRP (MISCELLANEOUS) IMPLANT
SUT MNCRL AB 4-0 PS2 18 (SUTURE) ×2 IMPLANT
SUT VIC AB 2-0 SH 27 (SUTURE)
SUT VIC AB 2-0 SH 27XBRD (SUTURE) IMPLANT
SUT VIC AB 2-0 UR6 27 (SUTURE) ×8 IMPLANT
SUT VIC AB 3-0 PS2 18 (SUTURE) ×2
SUT VIC AB 3-0 PS2 18XBRD (SUTURE) ×1 IMPLANT
SUT VIC AB 3-0 SH 27 (SUTURE) ×2
SUT VIC AB 3-0 SH 27X BRD (SUTURE) ×1 IMPLANT
SYR 10ML LL (SYRINGE) ×4 IMPLANT
SYR 20ML LL LF (SYRINGE) IMPLANT
SYR 50ML LL SCALE MARK (SYRINGE) ×4 IMPLANT
SYR BULB IRRIG 60ML STRL (SYRINGE) ×2 IMPLANT
TRAY DSU PREP LF (CUSTOM PROCEDURE TRAY) ×2 IMPLANT
TUBE CONNECTING 12X1/4 (SUCTIONS) ×2 IMPLANT
WATER STERILE IRR 500ML POUR (IV SOLUTION) ×2 IMPLANT
YANKAUER SUCT BULB TIP NO VENT (SUCTIONS) ×2 IMPLANT

## 2021-07-13 NOTE — Anesthesia Preprocedure Evaluation (Deleted)
Anesthesia Evaluation  Patient identified by MRN, date of birth, ID band Patient awake    Reviewed: Allergy & Precautions, NPO status , Patient's Chart, lab work & pertinent test results  Airway Mallampati: II  TM Distance: >3 FB Neck ROM: Full    Dental no notable dental hx.    Pulmonary sleep apnea , Current Smoker and Patient abstained from smoking.,    Pulmonary exam normal breath sounds clear to auscultation       Cardiovascular hypertension, Pt. on medications and Pt. on home beta blockers Normal cardiovascular exam Rhythm:Regular Rate:Normal     Neuro/Psych negative neurological ROS  negative psych ROS   GI/Hepatic GERD  ,(+)     substance abuse  marijuana use,   Endo/Other  diabetes, Type 2hyperlipidemia  Renal/GU      Musculoskeletal  (+) Arthritis , Osteoarthritis,  Chronic back pain   Abdominal   Peds  Hematology   Anesthesia Other Findings   Reproductive/Obstetrics                             Anesthesia Physical Anesthesia Plan  ASA: 3  Anesthesia Plan: General   Post-op Pain Management: Tylenol PO (pre-op)   Induction: Intravenous  PONV Risk Score and Plan: 2 and Treatment may vary due to age or medical condition  Airway Management Planned: Oral ETT  Additional Equipment: None  Intra-op Plan:   Post-operative Plan: Extubation in OR  Informed Consent: I have reviewed the patients History and Physical, chart, labs and discussed the procedure including the risks, benefits and alternatives for the proposed anesthesia with the patient or authorized representative who has indicated his/her understanding and acceptance.     Dental advisory given  Plan Discussed with: Anesthesiologist, CRNA and Surgeon  Anesthesia Plan Comments: (Patient ate at 645. Will need to wait for surgery until 1445 to be appropriately NPO. Will contact surgeon to see if waiting or  rescheduling is the plan. Norton Blizzard, MD  )      Anesthesia Quick Evaluation

## 2021-07-13 NOTE — Progress Notes (Signed)
Surgical clearance sent to Dr. Legrand Rams per Dr. Alyson Ingles surgical posting sheet.

## 2021-07-13 NOTE — Progress Notes (Signed)
REQUEST FOR SURGICAL CLEARANCE       Date: Date: 07/13/21  Surgeon: Dr. Nicolette Bang, MD     Date of Surgery: TBD (in the next month)  Operation: Insertion of penile prosthesis and removal of old one  Anesthesia Type: General     Medical Clearance : Yes  Risk Assessment:    Low   []       Moderate   []     High   []           This patient is optimized for surgery  YES []       NO   []    I recommend further assessment/workup prior to surgery. YES []      NO  []   Appointment scheduled for: _______________________   Further recommendations: ____________________________________     Physician Signature:__________________________________   Printed Name: ________________________________________   Date: _________________

## 2021-07-13 NOTE — Telephone Encounter (Signed)
Patient stopped by office today to reschedule surgery.  Patient had surgery date for today but ate breakfast this am and surgery had to be cancelled.  Will re-coordinate with patient and AMS rep for surgery at [facility]- Dr. Alyson Ingles agreed to surgery at [facility] for this am.

## 2021-07-14 NOTE — Progress Notes (Addendum)
[facility] [place] [facility] Posting Form    Surgery Date/Time: Date: 08/07/2021 (reschedule from 07/13/2021 due to patient eating day of surgery)   Surgeon: Dr. Nicolette Bang, MD   Surgery Location: [facility], approved with Dr. Alyson Ingles     Obs ( Yes  )    Diagnosis: Erectile Dysfunction N52.01   -CPT: 33174,09927   Surgery: Removal and Replacement of inflatable penile prosthesis and all components   Stop Anticoagulations: Yes, may continue ASA   Cardiac/Medical/Pulmonary Clearance needed: Yes, sent to Dr. Legrand Rams on 07/13/21   *Orders entered into EPIC  Date: 07/14/2021   *Case booked in EPIC  Date: 07/14/2021   *Notified pt of Surgery: Date: 07/14/2021 patient was called   No precert required.      Assistant/laser/rep:Yes, AMS Rep needs to be present, contacted and set up with rep on 07/14/2021 [facility] # [phone]

## 2021-07-20 ENCOUNTER — Telehealth (INDEPENDENT_AMBULATORY_CARE_PROVIDER_SITE_OTHER): Payer: Self-pay | Admitting: Gastroenterology

## 2021-07-20 NOTE — Telephone Encounter (Signed)
Patient came by the office stating he has been waiting on a call for his procedure - please advise - ph# [phone]

## 2021-07-26 ENCOUNTER — Ambulatory Visit: Payer: Medicare HMO | Admitting: Urology

## 2021-08-02 NOTE — Patient Instructions (Signed)
[name]  08/02/2021     @PREFPERIOPPHARMACY @   Your procedure is scheduled on  08/07/2021.   Report to [facility] at  [phone]  A.M.   Call this number if you have problems the morning of surgery:  [phone]   Remember:  Do not eat or drink after midnight.      DO NOT take any medications for diabetes the morning of your procedure.    Take these medicines the morning of surgery with A SIP OF WATER                 amlodipine, zyrtec, gabapentin, labetolol, metoprolol, omeprazole, oxycodone (if needed).     Do not wear jewelry, make-up or nail polish.  Do not wear lotions, powders, or perfumes, or deodorant.  Do not shave 48 hours prior to surgery.  Men may shave face and neck.  Do not bring valuables to the hospital.  [facility] is not responsible for any belongings or valuables.  Contacts, dentures or bridgework may not be worn into surgery.  Leave your suitcase in the car.  After surgery it may be brought to your room.  For patients admitted to the hospital, discharge time will be determined by your treatment team.  Patients discharged the day of surgery will not be allowed to drive home and must have someone with them for 24 hours.    Special instructions:   DO NOT smoke tobacco or vape for 24 hours before your procedure.  Please read over the following fact sheets that you were given. Coughing and Deep Breathing, Surgical Site Infection Prevention, Anesthesia Post-op Instructions, and Care and Recovery After Surgery      Penile Prosthesis Implantation, Care After This sheet gives you information about how to care for yourself after your procedure. Your health care provider may also give you more specific instructions. If you have problems or questions, contact your health care provider. What can I expect after the procedure? After the procedure, it is common to have: Pain and discomfort at the areas around your incision or incisions. Some  swelling of the scrotum or penis. Follow these instructions at home: Medicines Take over-the-counter and prescription medicines only as told by your health care provider. If you were prescribed an antibiotic medicine, take it as told by your health care provider. Do not stop using the antibiotic even if you start to feel better. Ask your health care provider if the medicine prescribed to you: Requires you to avoid driving or using machinery. Can cause constipation. You may need to take these actions to prevent or treat constipation: Drink enough fluid to keep your urine pale yellow. Take over-the-counter or prescription medicines. Eat foods that are high in fiber, such as beans, whole grains, and fresh fruits and vegetables. Limit foods that are high in fat and processed sugars, such as fried or sweet foods. Incision care  Follow instructions from your health care provider about how to take care of your incision or incisions. Make sure you: Wash your hands with soap and water for at least 20 seconds before and after you change your bandage (dressing). If soap and water are not available, use hand sanitizer. Change your dressing as told by your health care provider. Leave stitches (sutures), skin glue, or adhesive strips in place. These skin closures may need to stay in place for 2 weeks or longer. If adhesive strip edges start to loosen and curl up, you  may trim the loose edges. Do not remove adhesive strips completely unless your health care provider tells you to do that. Check your incision area every day for signs of infection. Check for: More redness, swelling, or pain. Fluid or blood. Warmth. Pus or a bad smell. Managing pain and swelling If directed, put ice on the affected area. To do this: Put ice in a plastic bag. Place a towel between your skin and the bag. Leave the ice on for 20 minutes, 2-3 times a day. Activity  If you were given a medicine to help you relax (sedative)  during the procedure, it can affect you for several hours. Do not drive or operate machinery until your health care provider says that it is safe. Rest as told by your health care provider. Get up to take short walks every 1-2 hours. This is important to improve blood flow and breathing. Ask for help if you feel weak or unsteady. Do not lift anything that is heavier than 10 lb (4.5 kg), or the limit that you are told, until your health care provider says that it is safe. Return to your normal activities as told by your health care provider. Ask your health care provider what activities are safe for you. General instructions Follow instructions from your health care provider about eating or drinking restrictions. For the first 24 hours after the procedure, you may be told to follow a clear liquid diet. This diet is limited to liquids that you can see through, such as clear broth or bouillon, black coffee or tea, clear juice, clear soft drinks or sports drinks, gelatin dessert, and flavored ice. Do not take baths, swim, or use a hot tub until your health care provider approves. Ask your health care provider if you may take showers. You may only be allowed to take sponge baths. Do not use any products that contain nicotine or tobacco, such as cigarettes, e-cigarettes, and chewing tobacco. These can delay incision healing after surgery. If you need help quitting, ask your health care provider. Wear a device similar to a jock strap or underwear with a supportive pouch (scrotal support) as told by your health care provider. If your scrotal support irritates your incision area, you may remove the support. Keep all follow-up visits as told by your health care provider. This is important. Contact a health care provider if: The implant inflates on its own. The implant does not work or it stops working. Your penis becomes curved or it changes shape in some other way. Get help right away if: You have more  redness, swelling, or pain around your incision area or in your scrotum. You have fluid or blood coming from your incision area. Your incision area feels warm to the touch. You have pus or a bad smell coming from your incision area. You have pain in your penis that gets worse. The skin on your penis becomes dark or discolored. Summary Check your incision area every day for signs of infection, such as more redness, swelling, or pain. Get up to take short walks every 1-2 hours. This is important to improve blood flow and breathing. Ask for help if you feel weak or unsteady. Wear a device such as a jock strap or underwear with a supportive pouch (scrotal support) as told by your health care provider. If your scrotal support irritates your incision area, you may remove the support. This information is not intended to replace advice given to you by your health care provider. Make  sure you discuss any questions you have with your health care provider. Document Revised: 06/29/2019 Document Reviewed: 06/29/2019 Elsevier Patient Education  2022 [place] Anesthesia, Adult, Care After This sheet gives you information about how to care for yourself after your procedure. Your health care provider may also give you more specific instructions. If you have problems or questions, contact your health care provider. What can I expect after the procedure? After the procedure, the following side effects are common: Pain or discomfort at the IV site. Nausea. Vomiting. Sore throat. Trouble concentrating. Feeling cold or chills. Feeling weak or tired. Sleepiness and fatigue. Soreness and body aches. These side effects can affect parts of the body that were not involved in surgery. Follow these instructions at home: For the time period you were told by your health care provider:  Rest. Do not participate in activities where you could fall or become injured. Do not drive or use machinery. Do not  drink alcohol. Do not take sleeping pills or medicines that cause drowsiness. Do not make important decisions or sign legal documents. Do not take care of children on your own. Eating and drinking Follow any instructions from your health care provider about eating or drinking restrictions. When you feel hungry, start by eating small amounts of foods that are soft and easy to digest (bland), such as toast. Gradually return to your regular diet. Drink enough fluid to keep your urine pale yellow. If you vomit, rehydrate by drinking water, juice, or clear broth. General instructions If you have sleep apnea, surgery and certain medicines can increase your risk for breathing problems. Follow instructions from your health care provider about wearing your sleep device: Anytime you are sleeping, including during daytime naps. While taking prescription pain medicines, sleeping medicines, or medicines that make you drowsy. Have a responsible adult stay with you for the time you are told. It is important to have someone help care for you until you are awake and alert. Return to your normal activities as told by your health care provider. Ask your health care provider what activities are safe for you. Take over-the-counter and prescription medicines only as told by your health care provider. If you smoke, do not smoke without supervision. Keep all follow-up visits as told by your health care provider. This is important. Contact a health care provider if: You have nausea or vomiting that does not get better with medicine. You cannot eat or drink without vomiting. You have pain that does not get better with medicine. You are unable to pass urine. You develop a skin rash. You have a fever. You have redness around your IV site that gets worse. Get help right away if: You have difficulty breathing. You have chest pain. You have blood in your urine or stool, or you vomit blood. Summary After the  procedure, it is common to have a sore throat or nausea. It is also common to feel tired. Have a responsible adult stay with you for the time you are told. It is important to have someone help care for you until you are awake and alert. When you feel hungry, start by eating small amounts of foods that are soft and easy to digest (bland), such as toast. Gradually return to your regular diet. Drink enough fluid to keep your urine pale yellow. Return to your normal activities as told by your health care provider. Ask your health care provider what activities are safe for you. This information is not intended to replace  advice given to you by your health care provider. Make sure you discuss any questions you have with your health care provider. Document Revised: 03/10/2020 Document Reviewed: 10/08/2019 Elsevier Patient Education  2022 [place]. How to Use Chlorhexidine for Bathing Chlorhexidine gluconate (CHG) is a germ-killing (antiseptic) solution that is used to clean the skin. It can get rid of the bacteria that normally live on the skin and can keep them away for about 24 hours. To clean your skin with CHG, you may be given: A CHG solution to use in the shower or as part of a sponge bath. A prepackaged cloth that contains CHG. Cleaning your skin with CHG may help lower the risk for infection: While you are staying in the intensive care unit of the hospital. If you have a vascular access, such as a central line, to provide short-term or long-term access to your veins. If you have a catheter to drain urine from your bladder. If you are on a ventilator. A ventilator is a machine that helps you breathe by moving air in and out of your lungs. After surgery. What are the risks? Risks of using CHG include: A skin reaction. Hearing loss, if CHG gets in your ears and you have a perforated eardrum. Eye injury, if CHG gets in your eyes and is not rinsed out. The CHG product catching fire. Make  sure that you avoid smoking and flames after applying CHG to your skin. Do not use CHG: If you have a chlorhexidine allergy or have previously reacted to chlorhexidine. On babies younger than 46 months of age. How to use CHG solution Use CHG only as told by your health care provider, and follow the instructions on the label. Use the full amount of CHG as directed. Usually, this is one bottle. During a shower Follow these steps when using CHG solution during a shower (unless your health care provider gives you different instructions): Start the shower. Use your normal soap and shampoo to wash your face and hair. Turn off the shower or move out of the shower stream. Pour the CHG onto a clean washcloth. Do not use any type of brush or rough-edged sponge. Starting at your neck, lather your body down to your toes. Make sure you follow these instructions: If you will be having surgery, pay special attention to the part of your body where you will be having surgery. Scrub this area for at least 1 minute. Do not use CHG on your head or face. If the solution gets into your ears or eyes, rinse them well with water. Avoid your genital area. Avoid any areas of skin that have broken skin, cuts, or scrapes. Scrub your back and under your arms. Make sure to wash skin folds. Let the lather sit on your skin for 1-2 minutes or as long as told by your health care provider. Thoroughly rinse your entire body in the shower. Make sure that all body creases and crevices are rinsed well. Dry off with a clean towel. Do not put any substances on your body afterward--such as powder, lotion, or perfume--unless you are told to do so by your health care provider. Only use lotions that are recommended by the manufacturer. Put on clean clothes or pajamas. If it is the night before your surgery, sleep in clean sheets.  During a sponge bath Follow these steps when using CHG solution during a sponge bath (unless your health  care provider gives you different instructions): Use your normal soap and shampoo  to wash your face and hair. Pour the CHG onto a clean washcloth. Starting at your neck, lather your body down to your toes. Make sure you follow these instructions: If you will be having surgery, pay special attention to the part of your body where you will be having surgery. Scrub this area for at least 1 minute. Do not use CHG on your head or face. If the solution gets into your ears or eyes, rinse them well with water. Avoid your genital area. Avoid any areas of skin that have broken skin, cuts, or scrapes. Scrub your back and under your arms. Make sure to wash skin folds. Let the lather sit on your skin for 1-2 minutes or as long as told by your health care provider. Using a different clean, wet washcloth, thoroughly rinse your entire body. Make sure that all body creases and crevices are rinsed well. Dry off with a clean towel. Do not put any substances on your body afterward--such as powder, lotion, or perfume--unless you are told to do so by your health care provider. Only use lotions that are recommended by the manufacturer. Put on clean clothes or pajamas. If it is the night before your surgery, sleep in clean sheets. How to use CHG prepackaged cloths Only use CHG cloths as told by your health care provider, and follow the instructions on the label. Use the CHG cloth on clean, dry skin. Do not use the CHG cloth on your head or face unless your health care provider tells you to. When washing with the CHG cloth: Avoid your genital area. Avoid any areas of skin that have broken skin, cuts, or scrapes. Before surgery Follow these steps when using a CHG cloth to clean before surgery (unless your health care provider gives you different instructions): Using the CHG cloth, vigorously scrub the part of your body where you will be having surgery. Scrub using a back-and-forth motion for 3 minutes. The area on your  body should be completely wet with CHG when you are done scrubbing. Do not rinse. Discard the cloth and let the area air-dry. Do not put any substances on the area afterward, such as powder, lotion, or perfume. Put on clean clothes or pajamas. If it is the night before your surgery, sleep in clean sheets.  For general bathing Follow these steps when using CHG cloths for general bathing (unless your health care provider gives you different instructions). Use a separate CHG cloth for each area of your body. Make sure you wash between any folds of skin and between your fingers and toes. Wash your body in the following order, switching to a new cloth after each step: The front of your neck, shoulders, and chest. Both of your arms, under your arms, and your hands. Your stomach and groin area, avoiding the genitals. Your right leg and foot. Your left leg and foot. The back of your neck, your back, and your buttocks. Do not rinse. Discard the cloth and let the area air-dry. Do not put any substances on your body afterward--such as powder, lotion, or perfume--unless you are told to do so by your health care provider. Only use lotions that are recommended by the manufacturer. Put on clean clothes or pajamas. Contact a health care provider if: Your skin gets irritated after scrubbing. You have questions about using your solution or cloth. You swallow any chlorhexidine. Call your local poison control center (1-(541)694-1616 in the [place] Get help right away if: Your eyes itch badly, or they  become very red or swollen. Your skin itches badly and is red or swollen. Your hearing changes. You have trouble seeing. You have swelling or tingling in your mouth or throat. You have trouble breathing. These symptoms may represent a serious problem that is an emergency. Do not wait to see if the symptoms will go away. Get medical help right away. Call your local emergency services (911 in the [place] Do not drive  yourself to the hospital. Summary Chlorhexidine gluconate (CHG) is a germ-killing (antiseptic) solution that is used to clean the skin. Cleaning your skin with CHG may help to lower your risk for infection. You may be given CHG to use for bathing. It may be in a bottle or in a prepackaged cloth to use on your skin. Carefully follow your health care provider's instructions and the instructions on the product label. Do not use CHG if you have a chlorhexidine allergy. Contact your health care provider if your skin gets irritated after scrubbing. This information is not intended to replace advice given to you by your health care provider. Make sure you discuss any questions you have with your health care provider. Document Revised: 09/05/2020 Document Reviewed: 09/05/2020 Elsevier Patient Education  2022 [employer].

## 2021-08-04 ENCOUNTER — Encounter (HOSPITAL_COMMUNITY)
Admission: RE | Admit: 2021-08-04 | Discharge: 2021-08-04 | Disposition: A | Discharge status: Home / Self Care | Payer: Medicare HMO | Source: Ambulatory Visit | Attending: Urology | Admitting: Urology

## 2021-08-04 ENCOUNTER — Other Ambulatory Visit (HOSPITAL_COMMUNITY)
Admission: RE | Admit: 2021-08-04 | Discharge: 2021-08-04 | Disposition: A | Discharge status: Home / Self Care | Payer: Medicare HMO | Source: Ambulatory Visit | Attending: Urology | Admitting: Urology

## 2021-08-04 ENCOUNTER — Encounter (HOSPITAL_COMMUNITY): Payer: Self-pay

## 2021-08-04 DIAGNOSIS — F1911 Other psychoactive substance abuse, in remission: Secondary | ICD-10-CM | POA: Insufficient documentation

## 2021-08-04 DIAGNOSIS — Z01812 Encounter for preprocedural laboratory examination: Secondary | ICD-10-CM | POA: Insufficient documentation

## 2021-08-04 DIAGNOSIS — E119 Type 2 diabetes mellitus without complications: Secondary | ICD-10-CM | POA: Diagnosis not present

## 2021-08-04 DIAGNOSIS — Z20822 Contact with and (suspected) exposure to covid-19: Secondary | ICD-10-CM | POA: Diagnosis not present

## 2021-08-04 DIAGNOSIS — Z01818 Encounter for other preprocedural examination: Secondary | ICD-10-CM

## 2021-08-04 LAB — RAPID URINE DRUG SCREEN, HOSP PERFORMED
Amphetamines: NOT DETECTED
Barbiturates: NOT DETECTED
Benzodiazepines: NOT DETECTED
Cocaine: NOT DETECTED
Opiates: NOT DETECTED
Tetrahydrocannabinol: POSITIVE — AB

## 2021-08-04 LAB — BASIC METABOLIC PANEL
Anion gap: 8 (ref 5–15)
BUN: 20 mg/dL (ref 8–23)
CO2: 28 mmol/L (ref 22–32)
Calcium: 9.6 mg/dL (ref 8.9–10.3)
Chloride: 101 mmol/L (ref 98–111)
Creatinine, Ser: 1.02 mg/dL (ref 0.61–1.24)
GFR, Estimated: >60 mL/min (ref >60)
Glucose, Bld: 150 mg/dL — ABNORMAL HIGH (ref 70–99)
Potassium: 4.2 mmol/L (ref 3.5–5.1)
Sodium: 137 mmol/L (ref 135–145)

## 2021-08-04 LAB — HEMOGLOBIN A1C
Hgb A1c MFr Bld: 8.5 % — ABNORMAL HIGH (ref 4.8–5.6)
Mean Plasma Glucose: 197.25 mg/dL

## 2021-08-05 LAB — SARS CORONAVIRUS 2 (TAT 6-24 HRS): SARS Coronavirus 2: NEGATIVE

## 2021-08-07 ENCOUNTER — Observation Stay (HOSPITAL_COMMUNITY)
Admission: RE | Admit: 2021-08-07 | Discharge: 2021-08-08 | Disposition: A | Discharge status: Home / Self Care | Payer: Medicare HMO | Attending: Urology | Admitting: Urology

## 2021-08-07 ENCOUNTER — Ambulatory Visit (HOSPITAL_COMMUNITY): Payer: Medicare HMO | Admitting: Anesthesiology

## 2021-08-07 ENCOUNTER — Encounter (HOSPITAL_COMMUNITY): Payer: Self-pay | Admitting: Urology

## 2021-08-07 ENCOUNTER — Other Ambulatory Visit: Payer: Self-pay

## 2021-08-07 ENCOUNTER — Encounter (HOSPITAL_COMMUNITY)
Admission: RE | Disposition: A | Discharge status: Home / Self Care | Payer: Self-pay | Source: Home / Self Care | Attending: Urology

## 2021-08-07 DIAGNOSIS — T83490A Other mechanical complication of penile (implanted) prosthesis, initial encounter: Secondary | ICD-10-CM | POA: Diagnosis not present

## 2021-08-07 DIAGNOSIS — G8929 Other chronic pain: Secondary | ICD-10-CM | POA: Diagnosis not present

## 2021-08-07 DIAGNOSIS — E119 Type 2 diabetes mellitus without complications: Secondary | ICD-10-CM | POA: Diagnosis not present

## 2021-08-07 DIAGNOSIS — M549 Dorsalgia, unspecified: Secondary | ICD-10-CM | POA: Insufficient documentation

## 2021-08-07 DIAGNOSIS — K219 Gastro-esophageal reflux disease without esophagitis: Secondary | ICD-10-CM | POA: Insufficient documentation

## 2021-08-07 DIAGNOSIS — G4730 Sleep apnea, unspecified: Secondary | ICD-10-CM | POA: Diagnosis not present

## 2021-08-07 DIAGNOSIS — Y838 Other surgical procedures as the cause of abnormal reaction of the patient, or of later complication, without mention of misadventure at the time of the procedure: Secondary | ICD-10-CM | POA: Insufficient documentation

## 2021-08-07 DIAGNOSIS — N529 Male erectile dysfunction, unspecified: Secondary | ICD-10-CM | POA: Diagnosis not present

## 2021-08-07 DIAGNOSIS — N5201 Erectile dysfunction due to arterial insufficiency: Secondary | ICD-10-CM

## 2021-08-07 DIAGNOSIS — I10 Essential (primary) hypertension: Secondary | ICD-10-CM | POA: Diagnosis not present

## 2021-08-07 HISTORY — PX: PENILE PROSTHESIS IMPLANT: SHX240

## 2021-08-07 HISTORY — PX: REMOVAL OF PENILE PROSTHESIS: SHX6059

## 2021-08-07 LAB — GLUCOSE, CAPILLARY
Glucose-Capillary: 257 mg/dL — ABNORMAL HIGH (ref 70–99)
Glucose-Capillary: 257 mg/dL — ABNORMAL HIGH (ref 70–99)

## 2021-08-07 SURGERY — INSERTION, PENILE PROSTHESIS, INFLATABLE
Anesthesia: General | Site: Penis

## 2021-08-07 MED ORDER — BELLADONNA ALKALOIDS-OPIUM 16.2-60 MG RE SUPP: 1.0000 | Freq: Four times a day (QID) | RECTAL | Status: DC | PRN

## 2021-08-07 MED ORDER — HYDROMORPHONE HCL 1 MG/ML IJ SOLN
0.2500 mg | INTRAMUSCULAR | Status: DC | PRN
  Administered 2021-08-07 (×3): 0.5 mg via INTRAVENOUS
  Filled 2021-08-07 (×3): qty 0.5

## 2021-08-07 MED ORDER — INSULIN ASPART 100 UNIT/ML IJ SOLN
0.0000 [IU] | Freq: Three times a day (TID) | INTRAMUSCULAR | Status: DC
  Administered 2021-08-07 – 2021-08-08 (×2): 8 [IU] via SUBCUTANEOUS
  Administered 2021-08-08: 3 [IU] via SUBCUTANEOUS

## 2021-08-07 MED ORDER — 0.9 % SODIUM CHLORIDE (POUR BTL) OPTIME
TOPICAL | Status: DC | PRN
  Administered 2021-08-07: 1000 mL

## 2021-08-07 MED ORDER — PROPOFOL 10 MG/ML IV BOLUS
INTRAVENOUS | Status: DC | PRN
  Administered 2021-08-07: 80 mg via INTRAVENOUS

## 2021-08-07 MED ORDER — STERILE WATER FOR IRRIGATION IR SOLN
Status: DC | PRN
  Administered 2021-08-07: 500 mL

## 2021-08-07 MED ORDER — ONDANSETRON HCL 4 MG/2ML IJ SOLN: 4.0000 mg | Freq: Once | INTRAMUSCULAR | Status: DC | PRN

## 2021-08-07 MED ORDER — GENTAMICIN SULFATE 40 MG/ML IJ SOLN
5.0000 mg/kg | INTRAVENOUS | Status: AC
  Administered 2021-08-07: 345.2 mg via INTRAVENOUS
  Filled 2021-08-07: qty 8.75

## 2021-08-07 MED ORDER — GABAPENTIN 100 MG PO CAPS
100.0000 mg | ORAL_CAPSULE | Freq: Three times a day (TID) | ORAL | Status: DC
  Administered 2021-08-07 – 2021-08-08 (×3): 100 mg via ORAL
  Filled 2021-08-07 (×3): qty 1

## 2021-08-07 MED ORDER — PHENYLEPHRINE HCL (PRESSORS) 10 MG/ML IV SOLN
INTRAVENOUS | Status: DC | PRN
  Administered 2021-08-07 (×2): 80 ug via INTRAVENOUS

## 2021-08-07 MED ORDER — DIPHENHYDRAMINE HCL 12.5 MG/5ML PO ELIX: 12.5000 mg | ORAL_SOLUTION | Freq: Four times a day (QID) | ORAL | Status: DC | PRN

## 2021-08-07 MED ORDER — SENNOSIDES-DOCUSATE SODIUM 8.6-50 MG PO TABS
2.0000 | ORAL_TABLET | Freq: Every day | ORAL | Status: DC
  Administered 2021-08-07: 2 via ORAL
  Filled 2021-08-07: qty 2

## 2021-08-07 MED ORDER — ROCURONIUM BROMIDE 10 MG/ML (PF) SYRINGE
PREFILLED_SYRINGE | INTRAVENOUS | Status: AC
  Filled 2021-08-07: qty 10

## 2021-08-07 MED ORDER — SODIUM CHLORIDE 0.9 % IV SOLN: INTRAVENOUS | Status: DC

## 2021-08-07 MED ORDER — FENTANYL CITRATE (PF) 100 MCG/2ML IJ SOLN
INTRAMUSCULAR | Status: AC
  Filled 2021-08-07: qty 2

## 2021-08-07 MED ORDER — DIPHENHYDRAMINE HCL 50 MG/ML IJ SOLN: 12.5000 mg | Freq: Four times a day (QID) | INTRAMUSCULAR | Status: DC | PRN

## 2021-08-07 MED ORDER — ONDANSETRON HCL 4 MG/2ML IJ SOLN
INTRAMUSCULAR | Status: AC
  Filled 2021-08-07: qty 2

## 2021-08-07 MED ORDER — HYDROMORPHONE HCL 1 MG/ML IJ SOLN
0.5000 mg | INTRAMUSCULAR | Status: DC | PRN
  Administered 2021-08-07 (×2): 1 mg via INTRAVENOUS
  Filled 2021-08-07 (×2): qty 1

## 2021-08-07 MED ORDER — LACTATED RINGERS IV SOLN: INTRAVENOUS | Status: DC | PRN

## 2021-08-07 MED ORDER — LACTATED RINGERS IV SOLN: INTRAVENOUS | Status: DC

## 2021-08-07 MED ORDER — ZOLPIDEM TARTRATE 5 MG PO TABS: 5.0000 mg | ORAL_TABLET | Freq: Every evening | ORAL | Status: DC | PRN

## 2021-08-07 MED ORDER — SIMVASTATIN 20 MG PO TABS
20.0000 mg | ORAL_TABLET | Freq: Every day | ORAL | Status: DC
  Administered 2021-08-07: 20 mg via ORAL
  Filled 2021-08-07: qty 1

## 2021-08-07 MED ORDER — PHENYLEPHRINE 40 MCG/ML (10ML) SYRINGE FOR IV PUSH (FOR BLOOD PRESSURE SUPPORT)
PREFILLED_SYRINGE | INTRAVENOUS | Status: AC
  Filled 2021-08-07: qty 10

## 2021-08-07 MED ORDER — INSULIN ASPART 100 UNIT/ML IJ SOLN
0.0000 [IU] | Freq: Every day | INTRAMUSCULAR | Status: DC
  Administered 2021-08-07: 3 [IU] via SUBCUTANEOUS

## 2021-08-07 MED ORDER — FENTANYL CITRATE (PF) 100 MCG/2ML IJ SOLN
INTRAMUSCULAR | Status: DC | PRN
Start: 2021-08-07 — End: 2021-08-07
  Administered 2021-08-07 (×2): 50 ug via INTRAVENOUS
  Administered 2021-08-07: 100 ug via INTRAVENOUS

## 2021-08-07 MED ORDER — PROPOFOL 10 MG/ML IV BOLUS
INTRAVENOUS | Status: AC
  Filled 2021-08-07: qty 20

## 2021-08-07 MED ORDER — OXYCODONE-ACETAMINOPHEN 5-325 MG PO TABS
1.0000 | ORAL_TABLET | ORAL | Status: DC | PRN
  Administered 2021-08-07: 1 via ORAL
  Administered 2021-08-08: 2 via ORAL
  Filled 2021-08-07: qty 1
  Filled 2021-08-07: qty 2

## 2021-08-07 MED ORDER — LABETALOL HCL 200 MG PO TABS
200.0000 mg | ORAL_TABLET | Freq: Two times a day (BID) | ORAL | Status: DC
  Administered 2021-08-07 – 2021-08-08 (×3): 200 mg via ORAL
  Filled 2021-08-07 (×3): qty 1

## 2021-08-07 MED ORDER — FLUTICASONE PROPIONATE 50 MCG/ACT NA SUSP
1.0000 | Freq: Every day | NASAL | Status: DC
  Filled 2021-08-07: qty 16

## 2021-08-07 MED ORDER — VANCOMYCIN HCL IN DEXTROSE 1-5 GM/200ML-% IV SOLN
1000.0000 mg | Freq: Once | INTRAVENOUS | Status: AC
  Administered 2021-08-08: 1000 mg via INTRAVENOUS
  Filled 2021-08-07: qty 200

## 2021-08-07 MED ORDER — ORAL CARE MOUTH RINSE: 15.0000 mL | Freq: Once | OROMUCOSAL | Status: DC

## 2021-08-07 MED ORDER — VANCOMYCIN HCL 1500 MG/300ML IV SOLN
1500.0000 mg | INTRAVENOUS | Status: AC
  Administered 2021-08-07: 1500 mg via INTRAVENOUS
  Filled 2021-08-07: qty 300

## 2021-08-07 MED ORDER — SODIUM CHLORIDE 0.9 % IR SOLN
Freq: Once | Status: DC
  Filled 2021-08-07: qty 1000

## 2021-08-07 MED ORDER — ONDANSETRON HCL 4 MG/2ML IJ SOLN
4.0000 mg | INTRAMUSCULAR | Status: DC | PRN
  Administered 2021-08-07: 4 mg via INTRAVENOUS
  Filled 2021-08-07: qty 2

## 2021-08-07 MED ORDER — METOPROLOL SUCCINATE ER 50 MG PO TB24
50.0000 mg | ORAL_TABLET | Freq: Every day | ORAL | Status: DC
  Administered 2021-08-07 – 2021-08-08 (×2): 50 mg via ORAL
  Filled 2021-08-07 (×2): qty 1

## 2021-08-07 MED ORDER — EPHEDRINE SULFATE (PRESSORS) 50 MG/ML IJ SOLN
INTRAMUSCULAR | Status: DC | PRN
  Administered 2021-08-07 (×2): 5 mg via INTRAVENOUS

## 2021-08-07 MED ORDER — CHLORHEXIDINE GLUCONATE 0.12 % MT SOLN: 15.0000 mL | Freq: Once | OROMUCOSAL | Status: DC

## 2021-08-07 MED ORDER — SODIUM CHLORIDE (PF) 0.9 % IJ SOLN
INTRAMUSCULAR | Status: DC | PRN
  Administered 2021-08-07: 1000 mL

## 2021-08-07 MED ORDER — DEXAMETHASONE SODIUM PHOSPHATE 10 MG/ML IJ SOLN
INTRAMUSCULAR | Status: AC
  Filled 2021-08-07: qty 1

## 2021-08-07 MED ORDER — ROCURONIUM BROMIDE 100 MG/10ML IV SOLN
INTRAVENOUS | Status: DC | PRN
  Administered 2021-08-07 (×2): 50 mg via INTRAVENOUS

## 2021-08-07 MED ORDER — CHLORHEXIDINE GLUCONATE 4 % EX LIQD
Freq: Once | CUTANEOUS | Status: DC
Start: 2021-08-07 — End: 2021-08-07

## 2021-08-07 MED ORDER — PANTOPRAZOLE SODIUM 40 MG PO TBEC
40.0000 mg | DELAYED_RELEASE_TABLET | Freq: Every day | ORAL | Status: DC
  Administered 2021-08-07 – 2021-08-08 (×2): 40 mg via ORAL
  Filled 2021-08-07 (×2): qty 1

## 2021-08-07 MED ORDER — SUGAMMADEX SODIUM 500 MG/5ML IV SOLN
INTRAVENOUS | Status: DC | PRN
  Administered 2021-08-07: 200 mg via INTRAVENOUS

## 2021-08-07 MED ORDER — GENTAMICIN SULFATE 40 MG/ML IJ SOLN
3.0000 mg/kg | Freq: Once | INTRAVENOUS | Status: AC
  Administered 2021-08-08: 210 mg via INTRAVENOUS
  Filled 2021-08-07: qty 5.25

## 2021-08-07 MED ORDER — EPHEDRINE 5 MG/ML INJ
INTRAVENOUS | Status: AC
  Filled 2021-08-07: qty 5

## 2021-08-07 MED ORDER — ONDANSETRON HCL 4 MG/2ML IJ SOLN
INTRAMUSCULAR | Status: DC | PRN
  Administered 2021-08-07: 4 mg via INTRAVENOUS

## 2021-08-07 MED ORDER — AMLODIPINE BESYLATE 5 MG PO TABS
10.0000 mg | ORAL_TABLET | Freq: Every day | ORAL | Status: DC
Start: 2021-08-07 — End: 2021-08-08
  Administered 2021-08-07 – 2021-08-08 (×2): 10 mg via ORAL
  Filled 2021-08-07 (×2): qty 2

## 2021-08-07 MED ORDER — ACETAMINOPHEN 325 MG PO TABS: 650.0000 mg | ORAL_TABLET | ORAL | Status: DC | PRN

## 2021-08-07 MED ORDER — DEXAMETHASONE SODIUM PHOSPHATE 10 MG/ML IJ SOLN
INTRAMUSCULAR | Status: DC | PRN
  Administered 2021-08-07: 10 mg via INTRAVENOUS

## 2021-08-07 MED ORDER — RAMIPRIL 5 MG PO CAPS
10.0000 mg | ORAL_CAPSULE | Freq: Every day | ORAL | Status: DC
  Administered 2021-08-07 – 2021-08-08 (×2): 10 mg via ORAL
  Filled 2021-08-07 (×2): qty 2

## 2021-08-07 SURGICAL SUPPLY — 74 items
ADH SKN CLS APL DERMABOND .7 (GAUZE/BANDAGES/DRESSINGS) ×1
APL PRP STRL LF DISP 70% ISPRP (MISCELLANEOUS) ×2
BAG DRN RND TRDRP ANRFLXCHMBR (UROLOGICAL SUPPLIES) ×1
BAG HAMPER (MISCELLANEOUS) ×3 IMPLANT
BAG URINE DRAIN 2000ML AR STRL (UROLOGICAL SUPPLIES) ×1 IMPLANT
BLADE SURG 15 STRL LF DISP TIS (BLADE) ×2 IMPLANT
BLADE SURG 15 STRL SS (BLADE) ×2
BNDG GAUZE ELAST 4 BULKY (GAUZE/BANDAGES/DRESSINGS) ×3 IMPLANT
BRUSH SCRUB EZ  4% CHG (MISCELLANEOUS) ×4
BRUSH SCRUB EZ 4% CHG (MISCELLANEOUS) ×4 IMPLANT
CATH COUDE FOLEY 2W 5CC 16FR (CATHETERS) ×1 IMPLANT
CATH FOLEY 2WAY SLVR  5CC 16FR (CATHETERS) ×2
CATH FOLEY 2WAY SLVR 5CC 16FR (CATHETERS) ×2 IMPLANT
CHLORAPREP W/TINT 26 (MISCELLANEOUS) ×4 IMPLANT
COVER MAYO STAND STRL (DRAPES) ×6 IMPLANT
COVER MAYO STAND XLG (MISCELLANEOUS) ×1 IMPLANT
COVER SURGICAL LIGHT HANDLE (MISCELLANEOUS) ×6 IMPLANT
DERMABOND ADVANCED (GAUZE/BANDAGES/DRESSINGS) ×1
DERMABOND ADVANCED .7 DNX12 (GAUZE/BANDAGES/DRESSINGS) ×2 IMPLANT
DRAIN PENROSE 0.5X18 (DRAIN) ×3 IMPLANT
DRAPE INCISE IOBAN 44X35 STRL (DRAPES) ×1 IMPLANT
DRAPE INCISE IOBAN 66X45 STRL (DRAPES) ×3 IMPLANT
ELECT NDL BLADE 2-5/6 (NEEDLE) IMPLANT
ELECT NEEDLE BLADE 2-5/6 (NEEDLE) IMPLANT
ELECT REM PT RETURN 9FT ADLT (ELECTROSURGICAL) ×2
ELECTRODE REM PT RTRN 9FT ADLT (ELECTROSURGICAL) ×2 IMPLANT
GAUZE 4X4 16PLY ~~LOC~~+RFID DBL (SPONGE) ×4 IMPLANT
GAUZE KERLIX 2  STERILE LF (GAUZE/BANDAGES/DRESSINGS) ×1 IMPLANT
GAUZE SPONGE 4X4 12PLY STRL (GAUZE/BANDAGES/DRESSINGS) ×1 IMPLANT
GAUZE SPONGE 4X4 12PLY STRL LF (GAUZE/BANDAGES/DRESSINGS) ×6 IMPLANT
GLOVE SRG 8 PF TXTR STRL LF DI (GLOVE) ×2 IMPLANT
GLOVE SURG ENC MOIS LTX SZ8 (GLOVE) ×3 IMPLANT
GLOVE SURG LTX SZ6.5 (GLOVE) ×1 IMPLANT
GLOVE SURG POLYISO LF SZ8 (GLOVE) ×4 IMPLANT
GLOVE SURG UNDER POLY LF SZ7 (GLOVE) ×9 IMPLANT
GLOVE SURG UNDER POLY LF SZ8 (GLOVE) ×4
GOWN STRL REUS W/TWL LRG LVL3 (GOWN DISPOSABLE) ×6 IMPLANT
GOWN STRL REUS W/TWL XL LVL3 (GOWN DISPOSABLE) ×3 IMPLANT
KIT ACCESSORY AMS 700 PUMP (UROLOGICAL SUPPLIES) ×1 IMPLANT
KIT TURNOVER CYSTO (KITS) ×3 IMPLANT
KIT TURNOVER KIT A (KITS) ×3 IMPLANT
MANIFOLD NEPTUNE II (INSTRUMENTS) ×3 IMPLANT
NS IRRIG 1000ML POUR BTL (IV SOLUTION) ×3 IMPLANT
NS IRRIG 500ML POUR BTL (IV SOLUTION) ×3 IMPLANT
PACK ABDOMINAL MAJOR (CUSTOM PROCEDURE TRAY) ×3 IMPLANT
PAD ABD 5X9 TENDERSORB (GAUZE/BANDAGES/DRESSINGS) ×3 IMPLANT
PAD ARMBOARD 7.5X6 YLW CONV (MISCELLANEOUS) ×3 IMPLANT
PANTS MESH DISP LRG (UNDERPADS AND DIAPERS) ×2 IMPLANT
PANTS MESH DISPOSABLE L (UNDERPADS AND DIAPERS) ×1
PENCIL SMOKE EVACUATOR (MISCELLANEOUS) ×3 IMPLANT
PLUG CATH AND CAP STER (CATHETERS) ×3 IMPLANT
PUMP PRECONNECT MS 18 LGX (Miscellaneous) IMPLANT
PUMP PRECONNECT MS 18CM LGX (Miscellaneous) ×2 IMPLANT
RESERVOIR FLAT IZ 100ML (Miscellaneous) ×1 IMPLANT
RETRACTOR DEEP SCROTAL PENILE (MISCELLANEOUS) ×1 IMPLANT
Rear Tip Extender ×1 IMPLANT
SET BASIN LINEN APH (SET/KITS/TRAYS/PACK) ×3 IMPLANT
SOL PREP PROV IODINE SCRUB 4OZ (MISCELLANEOUS) ×3 IMPLANT
SUT ETHILON 3 0 FSL (SUTURE) ×3 IMPLANT
SUT MNCRL AB 4-0 PS2 18 (SUTURE) ×3 IMPLANT
SUT VIC AB 0 CT1 27 (SUTURE) ×2
SUT VIC AB 0 CT1 27XBRD ANTBC (SUTURE) ×2 IMPLANT
SUT VIC AB 2-0 CT1 27 (SUTURE) ×2
SUT VIC AB 2-0 CT1 TAPERPNT 27 (SUTURE) ×2 IMPLANT
SUT VIC AB 2-0 UR6 27 (SUTURE) ×12 IMPLANT
SUT VIC AB 3-0 PS2 18 (SUTURE) ×2
SUT VIC AB 3-0 PS2 18XBRD (SUTURE) ×2 IMPLANT
SUT VIC AB 3-0 SH 27 (SUTURE) ×2
SUT VIC AB 3-0 SH 27X BRD (SUTURE) ×2 IMPLANT
SYR 10ML LL (SYRINGE) ×6 IMPLANT
SYR 50ML LL SCALE MARK (SYRINGE) ×6 IMPLANT
SYR BULB IRRIG 60ML STRL (SYRINGE) ×3 IMPLANT
WATER STERILE IRR 500ML POUR (IV SOLUTION) ×3 IMPLANT
YANKAUER SUCT BULB TIP NO VENT (SUCTIONS) ×1 IMPLANT

## 2021-08-07 NOTE — H&P (Signed)
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 He denies any urinary issues. No pain with erections. His penile prosthesis has not worked for over 1 year. He has good manual dexterity.   Past Medical History:  Diagnosis Date   Arthritis    fingers, back   Chronic back pain    Diabetes mellitus Since 1995   Takes Glucovance and Onglyza daily   Diabetes mellitus without complication ([place])    GERD (gastroesophageal reflux disease)    takes Dexilant and Omeprazole daily   Herpes    History of colon polyps    History of gastric ulcer 25+yrs ago   Hyperlipidemia Since 1995   takes Simvastatin daily   Hypertension Since 1995   takes Metoprolol and Ramipril daily   Hypertension    Pneumonia    hx of;as a child   Sleep apnea    doesn't use CPAP   Urinary frequency    Urinary urgency    takes Flomax daily   Past Surgical History:  Procedure Laterality Date   BACK SURGERY  1962   Lumbar   BACK SURGERY     bilateral cataract surgery     CARDIAC CATHETERIZATION  02/16/10   CERVICAL SPINE SURGERY     COLONOSCOPY     COLONOSCOPY N/A 04/23/2013   Procedure: COLONOSCOPY;  Surgeon: Rogene Houston, MD;  [facility];  Service: Endoscopy;  Laterality: N/A;  1030   COLONOSCOPY N/A 07/24/2018   Procedure: COLONOSCOPY;  Surgeon: Rogene Houston, MD;  [facility];  Service: Endoscopy;  Laterality: N/A;  8:30   ESOPHAGEAL DILATION N/A 02/10/2020   Procedure: ESOPHAGEAL DILATION;  Surgeon: Rogene Houston, MD;  [facility];  Service: Endoscopy;  Laterality: N/A;   ESOPHAGOGASTRODUODENOSCOPY N/A 12/24/2014   Procedure: ESOPHAGOGASTRODUODENOSCOPY (EGD);  Surgeon: Rogene Houston, MD;  [facility];  Service: Endoscopy;  Laterality: N/A;  2:45   ESOPHAGOGASTRODUODENOSCOPY (EGD) WITH  ESOPHAGEAL DILATION     ESOPHAGOGASTRODUODENOSCOPY (EGD) WITH PROPOFOL N/A 02/10/2020   Procedure: ESOPHAGOGASTRODUODENOSCOPY (EGD) WITH PROPOFOL;  Surgeon: Rogene Houston, MD;  [facility];  Service: Endoscopy;  Laterality: N/A;  250   HAND SURGERY     Right   LUMBAR LAMINECTOMY/DECOMPRESSION MICRODISCECTOMY N/A 08/29/2012   Procedure: LUMBAR LAMINECTOMY/DECOMPRESSION MICRODISCECTOMY 1 LEVEL;  Surgeon: Floyce Stakes, MD;  Location: [facility];  Service: Neurosurgery;  Laterality: N/A;  Lumbar four-five laminectomies   NECK SURGERY     PENILE PROSTHESIS IMPLANT     ROTATOR CUFF REPAIR     Right   SHOULDER SURGERY     TRANSURETHRAL RESECTION OF PROSTATE N/A 05/25/2019   Procedure: TRANSURETHRAL RESECTION OF THE PROSTATE (TURP);  Surgeon: Cleon Gustin, MD;  Location: [facility];  Service: Urology;  Laterality: N/A;   WRIST SURGERY     Left   WRIST SURGERY      Home Medications:  Current Facility-Administered Medications  Medication Dose Route Frequency Provider Last Rate Last Admin   chlorhexidine (HIBICLENS) 4 % liquid   Topical Once Keilah Lemire, Candee Furbish, MD       chlorhexidine (PERIDEX) 0.12 % solution 15 mL  15 mL Mouth/Throat Once Denese Killings, MD       Or   MEDLINE mouth rinse  15 mL Mouth Rinse Once Denese Killings, MD  gentamicin (GARAMYCIN) 350 mg in dextrose 5 % 100 mL IVPB  5 mg/kg Intravenous 30 min Pre-Op Alese Furniss, Candee Furbish, MD       lactated ringers infusion   Intravenous Continuous Battula, Rajamani C, MD       vancomycin (VANCOREADY) IVPB 1500 mg/300 mL  1,500 mg Intravenous 120 min pre-op Shuan Statzer, Candee Furbish, MD       Allergies:  Allergies  Allergen Reactions   Aspirin Other (See Comments)    Higher dosage upsets stomach   Bayer Aspirin [Aspirin] Other (See Comments)    Stomach "flares up"    Family History  Problem Relation Age of Onset   Heart attack Father 26       MI   Hypertension Father    Heart attack Brother 57        MI   Diabetes Mother    Heart attack Paternal Uncle    Social History:  reports that he has never smoked. He has never used smokeless tobacco. He reports that he does not currently use alcohol. He reports current drug use. Frequency: 3.00 times per week. Drug: Marijuana.  Review of Systems  All other systems reviewed and are negative.  Physical Exam:  Vital signs in last 24 hours: Temp:  [98 F (36.7 C)] 98 F (36.7 C) (01/30 0722) Pulse Rate:  [79] 79 (01/30 0722) Resp:  [18] 18 (01/30 0722) BP: (167)/(79) 167/79 (01/30 0722) SpO2:  [99 %] 99 % (01/30 2409) Physical Exam Vitals reviewed.  Constitutional:      Appearance: Normal appearance.  HENT:     Head: Normocephalic and atraumatic.     Nose: Nose normal. No congestion.     Mouth/Throat:     Mouth: Mucous membranes are dry.  Eyes:     Extraocular Movements: Extraocular movements intact.     Conjunctiva/sclera: Conjunctivae normal.     Pupils: Pupils are equal, round, and reactive to light.  Cardiovascular:     Rate and Rhythm: Normal rate and regular rhythm.  Pulmonary:     Effort: Pulmonary effort is normal. No respiratory distress.  Abdominal:     General: Abdomen is flat. There is no distension.  Musculoskeletal:        General: No swelling. Normal range of motion.     Cervical back: Normal range of motion and neck supple.  Skin:    General: Skin is warm and dry.  Neurological:     General: No focal deficit present.     Mental Status: He is alert and oriented to person, place, and time.  Psychiatric:        Mood and Affect: Mood normal.        Behavior: Behavior normal.        Thought Content: Thought content normal.        Judgment: Judgment normal.    Laboratory Data:  No results found for this or any previous visit (from the past 24 hour(s)). Recent Results (from the past 240 hour(s))  SARS CORONAVIRUS 2 (TAT 6-24 HRS) Nasopharyngeal Nasopharyngeal Swab     Status: None   Collection Time: 08/04/21  11:25 AM   Specimen: Nasopharyngeal Swab  Result Value Ref Range Status   SARS Coronavirus 2 NEGATIVE NEGATIVE Final    Comment: (NOTE) SARS-CoV-2 target nucleic acids are NOT DETECTED.  The SARS-CoV-2 RNA is generally detectable in upper and lower respiratory specimens during the acute phase of infection. Negative results do not preclude SARS-CoV-2 infection, do not rule out co-infections  with other pathogens, and should not be used as the sole basis for treatment or other patient management decisions. Negative results must be combined with clinical observations, patient history, and epidemiological information. The expected result is Negative.  Fact Sheet for Patients: SugarRoll.be  Fact Sheet for Healthcare Providers: https://www.woods-mathews.com/  This test is not yet approved or cleared by the [place] FDA and  has been authorized for detection and/or diagnosis of SARS-CoV-2 by FDA under an Emergency Use Authorization (EUA). This EUA will remain  in effect (meaning this test can be used) for the duration of the COVID-19 declaration under Se ction 564(b)(1) of the Act, 21 U.S.C. section 360bbb-3(b)(1), unless the authorization is terminated or revoked sooner.  Performed at [facility], [place] [address], [place], [place] [address]    Creatinine: Recent Labs    08/04/21 1125  CREATININE 1.02   Baseline Creatinine: 1  Impression/Assessment:  80yo with a nonfunctioning penile prosthesis  Plan:  The risks/benefits/alternatives to removal and replacement of an inflatable penile prosthesis was explained to the patient and he understands and wishes to proceed with surgery  Nicolette Bang 08/07/2021, 7:36 AM

## 2021-08-07 NOTE — Op Note (Signed)
Preoperative diagnosis: Erectile Dysfunction  Postoperative diagnosis: Same  Procedure: 1. Removal of inflatable penile prosthesis and Placement of an AMS 700 3 Piece inflatable penile prosthesis  Attending: Nicolette Bang, MD  Anesthesia: General  History of blood loss: Minimal  Antibiotics: Vancomycin and Gentamicin  Drains: 16 french foley  Specimens: Inflatable penile prosthesis cylinders, pump and tubing  Findings: 100cc reservoir placed on the right. 23cm cylinder length on right and left. 15cm cylinder with 8cm rear tip extension. No deformity or curvature on cycling of the device. Original reservoir left in situ due to difficulty attempting to remove reservoir.   Indications: Patient is a 81 year old male with a history of erectile dysfunction who underwent IPP placement 15 years ago. He device was working well until 1 year ago when it stopping pumping.  We discussed the treatment options and he has elected to pursue penile prosthesis insertion.   Procedure in detail: Prior to procedure consent was obtained.  Patient was brought to the operating room and a brief timeout was done to ensure correct patient, correct procedure, correct site.  General anesthesia was administered and patient was placed in supine position. We performed a 10 minute scrub of his genitalia prior to the using alcohol prep.   His genitalia and abdomen was then prepped and draped in usual sterile fashion.  A 16 French foley catheter was placed and the bladder was drained. The penile was then placed on stretch with the aid of a hook through the meatus attached to the lonestar retractor. A 5cm incision was made at the penoscrotal junction.  We dissected down to the urethra and corporal bodies.  We dissected over the previous IPP tubing and then brought both corporal body tubing into the operative field. We then dissected over the pump and brought the pump into the operative field. We then dissected the reservoir  tubing to the dorsal aspect of the penis. We then sharply incised the reservoir tubing and drained the reservoir. We elected to leave the reservoir in situ. Using electrocautery we incised the corporal bodies over where the tubing inserted into the corporal bodies. We then removed both right and left cylinders and tubing. We then placed 2 stay sutures on the medial and lateral aspect of the corporal bodies with 2-0 vicryl. We then used sequential dilators to dilate the proximal and distal corporal body to 7mm. We the measured the proximal corporal length which was 10cm. We then measured the distal corporal length which was 13cm.  The corporal body was the irrigated with antibiotic irrigation. We then did a similar technique on the left. The proximal corporal length was 10cm and the distal corporal length was 13cm. We then turned our attention to placing the reservoir. We used blunt dissection along the right spermatic cord to create a space past the inguinal canal. We then placed the reservoir and filled it with 90cc of normal saline and then a rubber shod was placed on the tubing. We then turned our attention to placing the cylinders. We placed the proximal end of the right cylinder into the corpora and seated it with the aid of the applicator. We the placed the string attached to the distal end of the cylinder through a Newton needle. We then attached the needle to the furlough and advanced the furlough through the corporal incision up to the glans. The needle was then advanced through the glans and the string was then secured with a snap. Once cylinder was seated int he corpora we then  tied the stay sutures over the cylinder. We then turned our attention to the left corpora. A similar technique was used to place the left cylinder. Once this was complete we then tested the device and noted no leaks and a straight erection.We then deflated the device. We then proceeded to make a subdartos pocket in the scrotum for  the pump. Once this was complete the pump was placed the the pouch and the pouch was then closed with a running 2-0 Vicryl. We then turned our attention to connecting the device. The tubing was cut to length and then the locking clips were placed on either end of the tubing. A barrel connector was then placed on on end of the tubing. The tubing was then irrigated with normal saline to ensure no air bubbles in the tubing. The free end of the barrel was then attached to the reservoir tubing and using the crimping tool the barrel was secured to the tubing. We then closed the dartos over the tubing suing a 2-0 vicryl in a running fashion. We then closed a second layer of dartos over the tubing. The skin was then closed with 4-0 monocryl in a running fashion. Skin glue was then placed over the incision. We then removed the strings attached to the distal ends of the cylinders and placed skin glue over the incisions in the glans. The device was then cycled to partially erect.  We then placed a scrotal fluff and this then concluded the procedure which was well tolerated by the patient.  Complications: None  Condition: Stable, extubated, transferred to PACU.  Plan: Patient is to be admitted overnight for IV antibiotics.  His foley will be removed in the morning and the device will be deactivated. He will followup in 2 weeks for a wound check. He will be discharged with 1 week of antibiotics.

## 2021-08-07 NOTE — Progress Notes (Signed)
Patient alert and oriented, able to eat all of his dinner, PRN zofran effective and oral analgesics effective at this time for pain control Adequate output from Coude Catheter . Call bell and hydration within reach.

## 2021-08-07 NOTE — Transfer of Care (Signed)
Immediate Anesthesia Transfer of Care Note  Patient: [name]  Procedure(s) Performed: PENILE PROTHESIS INFLATABLE (Penis) REMOVAL OF PENILE PROSTHESIS (Penis)  Patient Location: PACU  Anesthesia Type:General  Level of Consciousness: awake, alert , oriented and patient cooperative  Airway & Oxygen Therapy: Patient Spontanous Breathing and Patient connected to face mask oxygen  Post-op Assessment: Report given to RN, Post -op Vital signs reviewed and stable and Patient moving all extremities X 4  Post vital signs: Reviewed and stable  Last Vitals:  Vitals Value Taken Time  BP 145/70 08/07/21 1045  Temp    Pulse 76 08/07/21 1045  Resp 16 08/07/21 1045  SpO2 100 % 08/07/21 1045  Vitals shown include unvalidated device data.  Last Pain:  Vitals:   08/07/21 0722  TempSrc: Oral  PainSc: 3          Complications: No notable events documented.

## 2021-08-07 NOTE — Anesthesia Procedure Notes (Signed)
Procedure Name: Intubation Date/Time: 08/07/2021 8:10 AM Performed by: Jonna Munro, CRNA Pre-anesthesia Checklist: Patient identified, Emergency Drugs available, Suction available, Patient being monitored and Timeout performed Patient Re-evaluated:Patient Re-evaluated prior to induction Oxygen Delivery Method: Circle system utilized Preoxygenation: Pre-oxygenation with 100% oxygen Induction Type: IV induction Ventilation: Mask ventilation without difficulty Laryngoscope Size: Mac and 4 Grade View: Grade II Tube type: Oral Tube size: 7.5 mm Number of attempts: 1 Airway Equipment and Method: Stylet Secured at: 23 cm Tube secured with: Tape Dental Injury: Teeth and Oropharynx as per pre-operative assessment

## 2021-08-07 NOTE — Anesthesia Postprocedure Evaluation (Signed)
Anesthesia Post Note  Patient: [name]  Procedure(s) Performed: PENILE PROTHESIS INFLATABLE (Penis) REMOVAL OF PENILE PROSTHESIS (Penis)  Patient location during evaluation: PACU Anesthesia Type: General Level of consciousness: awake and alert and oriented Pain management: pain level controlled Vital Signs Assessment: post-procedure vital signs reviewed and stable Respiratory status: spontaneous breathing, nonlabored ventilation and respiratory function stable Cardiovascular status: blood pressure returned to baseline and stable Postop Assessment: no apparent nausea or vomiting Anesthetic complications: no   No notable events documented.   Last Vitals:  Vitals:   08/07/21 1115 08/07/21 1130  BP: (!) 146/70 (!) 153/78  Pulse: 67 93  Resp: 12 18  Temp:    SpO2: 100% 100%    Last Pain:  Vitals:   08/07/21 1130  TempSrc:   PainSc: 8                  Karandeep Resende C Britain Saber

## 2021-08-07 NOTE — Anesthesia Preprocedure Evaluation (Signed)
Anesthesia Evaluation  Patient identified by MRN, date of birth, ID band Patient awake    Reviewed: Allergy & Precautions, NPO status , Patient's Chart, lab work & pertinent test results, reviewed documented beta blocker date and time   Airway Mallampati: II  TM Distance: >3 FB Neck ROM: Full   Comment: Neck sx Dental  (+) Dental Advisory Given, Caps, Missing   Pulmonary sleep apnea , pneumonia,    Pulmonary exam normal breath sounds clear to auscultation       Cardiovascular hypertension, Pt. on medications and Pt. on home beta blockers Normal cardiovascular exam Rhythm:Regular Rate:Normal     Neuro/Psych  Neuromuscular disease (right shoulder pain, weakness, neck pain) negative psych ROS   GI/Hepatic PUD, GERD  Medicated,(+)     substance abuse (last use - 08/06/21)  marijuana use,   Endo/Other  diabetes, Well Controlled, Type 2, Oral Hypoglycemic Agents  Renal/GU      Musculoskeletal  (+) Arthritis , Osteoarthritis,    Abdominal   Peds  Hematology negative hematology ROS (+)   Anesthesia Other Findings Neck sx Chronic back pain  Reproductive/Obstetrics                             Anesthesia Physical Anesthesia Plan  ASA: 3  Anesthesia Plan: General   Post-op Pain Management: Dilaudid IV   Induction: Intravenous  PONV Risk Score and Plan: 3 and 4 or greater and Ondansetron and Dexamethasone  Airway Management Planned: Oral ETT  Additional Equipment:   Intra-op Plan:   Post-operative Plan: Extubation in OR  Informed Consent: I have reviewed the patients History and Physical, chart, labs and discussed the procedure including the risks, benefits and alternatives for the proposed anesthesia with the patient or authorized representative who has indicated his/her understanding and acceptance.     Dental advisory given  Plan Discussed with: CRNA and Surgeon  Anesthesia Plan  Comments:        Anesthesia Quick Evaluation

## 2021-08-07 NOTE — Progress Notes (Signed)
Pharmacy Antibiotic Note  [name] is a 81 y.o. male admitted on 08/07/2021.  Pharmacy has been consulted for Vancomycin and Gentamicin (synergy, once daily) dosing for 1 dose.  Plan: Vancomycin 1000 mg IV x 1 dose 1/31 SCr used: 1.02  Gentamicin 3mg /kg (210mg ) IV x 1 day 1/31  Weight: 69 kg (152 lb 1.9 oz)  Temp (24hrs), Avg:97.8 F (36.6 C), Min:97.6 F (36.4 C), Max:98 F (36.7 C)  Recent Labs  Lab 08/04/21 1125  CREATININE 1.02    Estimated Creatinine Clearance: 56.4 mL/min (by C-G formula based on SCr of 1.02 mg/dL).    Allergies  Allergen Reactions   Aspirin Other (See Comments)    Higher dosage upsets stomach   Bayer Aspirin [Aspirin] Other (See Comments)    Stomach "flares up"    Antimicrobials this admission: Vancomycin 1/30 >> 1/31 Gentamicin 1/30 >> 1/31  Microbiology results: No cultures  Thank you for allowing pharmacy to be a part of this patients care.  Isac Sarna, BS Pharm D, BCPS Clinical Pharmacist Pager [phone] 08/07/2021 1:50 PM

## 2021-08-08 DIAGNOSIS — T83490A Other mechanical complication of penile (implanted) prosthesis, initial encounter: Secondary | ICD-10-CM | POA: Diagnosis not present

## 2021-08-08 LAB — BASIC METABOLIC PANEL
Anion gap: 6 (ref 5–15)
BUN: 28 mg/dL — ABNORMAL HIGH (ref 8–23)
CO2: 25 mmol/L (ref 22–32)
Calcium: 8.5 mg/dL — ABNORMAL LOW (ref 8.9–10.3)
Chloride: 103 mmol/L (ref 98–111)
Creatinine, Ser: 1.01 mg/dL (ref 0.61–1.24)
GFR, Estimated: >60 mL/min (ref >60)
Glucose, Bld: 184 mg/dL — ABNORMAL HIGH (ref 70–99)
Potassium: 4.1 mmol/L (ref 3.5–5.1)
Sodium: 134 mmol/L — ABNORMAL LOW (ref 135–145)

## 2021-08-08 LAB — CBC
HCT: 30.3 % — ABNORMAL LOW (ref 39.0–52.0)
Hemoglobin: 10.1 g/dL — ABNORMAL LOW (ref 13.0–17.0)
MCH: 31.3 pg (ref 26.0–34.0)
MCHC: 33.3 g/dL (ref 30.0–36.0)
MCV: 93.8 fL (ref 80.0–100.0)
Platelets: 143 10*3/uL — ABNORMAL LOW (ref 150–400)
RBC: 3.23 MIL/uL — ABNORMAL LOW (ref 4.22–5.81)
RDW: 13 % (ref 11.5–15.5)
WBC: 6.4 10*3/uL (ref 4.0–10.5)
nRBC: 0 % (ref 0.0–0.2)

## 2021-08-08 LAB — GLUCOSE, CAPILLARY
Glucose-Capillary: 164 mg/dL — ABNORMAL HIGH (ref 70–99)
Glucose-Capillary: 251 mg/dL — ABNORMAL HIGH (ref 70–99)

## 2021-08-08 MED ORDER — SULFAMETHOXAZOLE-TRIMETHOPRIM 800-160 MG PO TABS: 1.0000 | ORAL_TABLET | Freq: Two times a day (BID) | ORAL | 0 refills | Status: DC

## 2021-08-08 MED ORDER — OXYCODONE-ACETAMINOPHEN 5-325 MG PO TABS: 1.0000 | ORAL_TABLET | ORAL | 0 refills | Status: DC | PRN

## 2021-08-09 ENCOUNTER — Encounter (HOSPITAL_COMMUNITY): Payer: Self-pay | Admitting: Urology

## 2021-08-14 LAB — SURGICAL PATHOLOGY

## 2021-08-17 NOTE — Progress Notes (Signed)
Patient has a history of substance abuse

## 2021-08-17 NOTE — Discharge Summary (Signed)
Physician Discharge Summary  Patient ID: [name] MRN: [id number] DOB/AGE: 1941-01-21 81 y.o.  Admit date: 08/07/2021 Discharge date: 08/08/2020  Admission Diagnoses:  Erectile dysfunction  Discharge Diagnoses:  Principal Problem:   Erectile dysfunction   Past Medical History:  Diagnosis Date   Arthritis    fingers, back   Chronic back pain    Diabetes mellitus Since 1995   Takes Glucovance and Onglyza daily   Diabetes mellitus without complication ([place])    GERD (gastroesophageal reflux disease)    takes Dexilant and Omeprazole daily   Herpes    History of colon polyps    History of gastric ulcer 25+yrs ago   Hyperlipidemia Since 1995   takes Simvastatin daily   Hypertension Since 1995   takes Metoprolol and Ramipril daily   Hypertension    Pneumonia    hx of;as a child   Sleep apnea    doesn't use CPAP   Urinary frequency    Urinary urgency    takes Flomax daily    Surgeries: Procedure(s): PENILE PROTHESIS INFLATABLE REMOVAL OF PENILE PROSTHESIS on 08/07/2021   Consultants (if any):   Discharged Condition: Improved  Hospital Course: [name] is an 81 y.o. male who was admitted 08/07/2021 with a diagnosis of Erectile dysfunction and went to the operating room on 08/07/2021 and underwent the above named procedures.    He was given perioperative antibiotics:  Anti-infectives (From admission, onward)    Start     Dose/Rate Route Frequency Ordered Stop   08/08/21 1000  gentamicin (GARAMYCIN) 210 mg in dextrose 5 % 100 mL IVPB        3 mg/kg  69 kg 105.3 mL/hr over 60 Minutes Intravenous  Once 08/07/21 1437 08/08/21 1316   08/08/21 1000  vancomycin (VANCOCIN) IVPB 1000 mg/200 mL premix        1,000 mg 200 mL/hr over 60 Minutes Intravenous  Once 08/07/21 1437 08/08/21 0951   08/08/21 0000  sulfamethoxazole-trimethoprim (BACTRIM DS) 800-160 MG tablet        1 tablet Oral 2 times daily 08/08/21 0938     08/07/21 0906  polymyxin 500,000  units/gentamicin 80 mg/0.9 % sodium chloride ophth. irrigation  Status:  Discontinued          As needed 08/07/21 0906 08/07/21 1317   08/07/21 0845  gentamicin (GARAMYCIN) 80 mg, polymyxin B 500,000 Units in sodium chloride irrigation 0.9 % 1,000 mL irrigation  Status:  Discontinued         Irrigation  Once 08/07/21 0837 08/08/21 2122   08/07/21 0732  vancomycin (VANCOREADY) IVPB 1500 mg/300 mL        1,500 mg 150 mL/hr over 120 Minutes Intravenous 120 min pre-op 08/07/21 0732 08/07/21 0845   08/07/21 0630  gentamicin (GARAMYCIN) 350 mg in dextrose 5 % 100 mL IVPB        5 mg/kg  69 kg 108.8 mL/hr over 60 Minutes Intravenous 30 min pre-op 08/07/21 0630 08/08/21 1217     .  He was given sequential compression devices, early ambulation for DVT prophylaxis.  He benefited maximally from the hospital stay and there were no complications.    Recent vital signs:  Vitals:   08/08/21 0529 08/08/21 1427  BP: 132/75 122/76  Pulse: 70 74  Resp: 16 16  Temp: 97.8 F (36.6 C) 98.3 F (36.8 C)  SpO2: 100% 99%    Recent laboratory studies:  Lab Results  Component Value Date   HGB 10.1 (L) 08/08/2021  HGB 13.9 07/13/2021   HGB 12.2 (L) 01/12/2021   Lab Results  Component Value Date   WBC 6.4 08/08/2021   PLT 143 (L) 08/08/2021   No results found for: INR Lab Results  Component Value Date   NA 134 (L) 08/08/2021   K 4.1 08/08/2021   CL 103 08/08/2021   CO2 25 08/08/2021   BUN 28 (H) 08/08/2021   CREATININE 1.01 08/08/2021   GLUCOSE 184 (H) 08/08/2021    Discharge Medications:   Allergies as of 08/08/2021       Reactions   Aspirin Other (See Comments)   Higher dosage upsets stomach   Bayer Aspirin [aspirin] Other (See Comments)   Stomach "flares up"        Medication List     TAKE these medications    acyclovir 200 MG capsule Commonly known as: ZOVIRAX Take 200 mg by mouth as needed.   amLODipine 10 MG tablet Commonly known as: NORVASC Take 10 mg by mouth  daily.   cetirizine 10 MG tablet Commonly known as: ZYRTEC Take 10 mg by mouth daily.   doxycycline 100 MG capsule Commonly known as: VIBRAMYCIN Take 1 capsule (100 mg total) by mouth 2 (two) times daily.   fluticasone 50 MCG/ACT nasal spray Commonly known as: FLONASE Place 1 spray into both nostrils daily.   gabapentin 100 MG capsule Commonly known as: NEURONTIN Take 1 capsule (100 mg total) by mouth 3 (three) times daily.   glyBURIDE-metformin 5-500 MG tablet Commonly known as: GLUCOVANCE Take 2 tablets by mouth 2 (two) times daily with a meal.   labetalol 200 MG tablet Commonly known as: NORMODYNE Take 200 mg by mouth 2 (two) times daily.   metoprolol succinate 50 MG 24 hr tablet Commonly known as: TOPROL-XL Take 50 mg by mouth daily. Take with or immediately following a meal.   naproxen 500 MG tablet Commonly known as: NAPROSYN Take 500 mg by mouth 2 (two) times daily with a meal.   omeprazole 40 MG capsule Commonly known as: PRILOSEC Take 1 capsule (40 mg total) by mouth daily.   OneTouch Delica Plus [id number] Misc USE TO TEST TWICE DAILY   OneTouch Verio Flex System w/Device Kit USE TO TEST TWICE DAILY   OneTouch Verio test strip Generic drug: glucose blood USE TO TEST TWICE DAILY   oxyCODONE 5 MG immediate release tablet Commonly known as: Oxy IR/ROXICODONE Take 5 mg by mouth 3 (three) times daily. For pain   oxyCODONE-acetaminophen 5-325 MG tablet Commonly known as: Percocet Take 1 tablet by mouth every 4 (four) hours as needed for severe pain.   polyethylene glycol powder 17 GM/SCOOP powder Commonly known as: GlycoLax Take 17 g by mouth daily.   ramipril 10 MG capsule Commonly known as: ALTACE Take 10 mg by mouth daily.   simvastatin 20 MG tablet Commonly known as: ZOCOR Take 20 mg by mouth daily.   sulfamethoxazole-trimethoprim 800-160 MG tablet Commonly known as: BACTRIM DS Take 1 tablet by mouth 2 (two) times daily.         Diagnostic Studies: No results found.  Disposition: Discharge disposition: 01-Home or Self Care       Discharge Instructions     Discharge patient   Complete by: As directed    Discharge disposition: 01-Home or Self Care   Discharge patient date: 08/08/2021        Follow-up Information     Shomari Matusik, Candee Furbish, MD. Call in 2 week(s).   Specialty: Urology Contact information:  [address]  [place] [address] [phone]                  Signed: Nicolette Bang 08/17/2021, 9:41 AM

## 2021-08-22 ENCOUNTER — Ambulatory Visit (INDEPENDENT_AMBULATORY_CARE_PROVIDER_SITE_OTHER): Payer: Medicare HMO | Admitting: Urology

## 2021-08-22 ENCOUNTER — Encounter: Payer: Self-pay | Admitting: Urology

## 2021-08-22 ENCOUNTER — Other Ambulatory Visit: Payer: Self-pay

## 2021-08-22 VITALS — BP 134/77 | HR 66

## 2021-08-22 DIAGNOSIS — T83490A Other mechanical complication of penile (implanted) prosthesis, initial encounter: Secondary | ICD-10-CM

## 2021-08-22 MED ORDER — BACITRACIN 500 UNIT/GM EX OINT: 1.0000 "application " | TOPICAL_OINTMENT | Freq: Two times a day (BID) | CUTANEOUS | 1 refills | Status: DC

## 2021-08-22 MED ORDER — AMOXICILLIN-POT CLAVULANATE 875-125 MG PO TABS: 1.0000 | ORAL_TABLET | Freq: Two times a day (BID) | ORAL | 0 refills | Status: DC

## 2021-08-22 MED ORDER — OXYCODONE-ACETAMINOPHEN 5-325 MG PO TABS: 1.0000 | ORAL_TABLET | ORAL | 0 refills | Status: AC | PRN

## 2021-08-23 LAB — URINALYSIS, ROUTINE W REFLEX MICROSCOPIC
Bilirubin, UA: NEGATIVE
Ketones, UA: NEGATIVE
Leukocytes,UA: NEGATIVE
Nitrite, UA: NEGATIVE
Protein,UA: NEGATIVE
Specific Gravity, UA: 1.015 (ref 1.005–1.030)
Urobilinogen, Ur: 0.2 mg/dL (ref 0.2–1.0)
pH, UA: 5.5 (ref 5.0–7.5)

## 2021-08-23 LAB — MICROSCOPIC EXAMINATION
Epithelial Cells (non renal): NONE SEEN /hpf (ref 0–10)
Renal Epithel, UA: NONE SEEN /hpf
WBC, UA: NONE SEEN /hpf (ref 0–5)

## 2021-08-29 NOTE — Progress Notes (Signed)
08/22/2021 8:07 AM   [name] 06/09/1941 [id number]  Referring provider: Carrolyn Meiers, MD [name],  [name] [id number]  Followup after IPP revision   HPI: [name] is a 81yo here for followup after IPP placement. He has mild right inguinal pain from reservoir insertion site. He has been placing neosporin on the incision. No fevers. NO scrotal swelling. No drainage from the incision. No other complaints today   PMH: Past Medical History:  Diagnosis Date   Arthritis    fingers, back   Chronic back pain    Diabetes mellitus Since 1995   Takes Glucovance and Onglyza daily   Diabetes mellitus without complication ([place])    GERD (gastroesophageal reflux disease)    takes Dexilant and Omeprazole daily   Herpes    History of colon polyps    History of gastric ulcer 25+yrs ago   Hyperlipidemia Since 1995   takes Simvastatin daily   Hypertension Since 1995   takes Metoprolol and Ramipril daily   Hypertension    Pneumonia    hx of;as a child   Sleep apnea    doesn't use CPAP   Urinary frequency    Urinary urgency    takes Flomax daily    Surgical History: Past Surgical History:  Procedure Laterality Date   BACK SURGERY  1962   Lumbar   BACK SURGERY     bilateral cataract surgery     CARDIAC CATHETERIZATION  02/16/10   CERVICAL SPINE SURGERY     COLONOSCOPY     COLONOSCOPY N/A 04/23/2013   Procedure: COLONOSCOPY;  Surgeon: Rogene Houston, MD;  [facility];  Service: Endoscopy;  Laterality: N/A;  1030   COLONOSCOPY N/A 07/24/2018   Procedure: COLONOSCOPY;  Surgeon: Rogene Houston, MD;  [facility];  Service: Endoscopy;  Laterality: N/A;  8:30   ESOPHAGEAL DILATION N/A 02/10/2020   Procedure: ESOPHAGEAL DILATION;  Surgeon: Rogene Houston, MD;  [facility];  Service: Endoscopy;  Laterality: N/A;   ESOPHAGOGASTRODUODENOSCOPY N/A 12/24/2014   Procedure: ESOPHAGOGASTRODUODENOSCOPY (EGD);   Surgeon: Rogene Houston, MD;  [facility];  Service: Endoscopy;  Laterality: N/A;  2:45   ESOPHAGOGASTRODUODENOSCOPY (EGD) WITH ESOPHAGEAL DILATION     ESOPHAGOGASTRODUODENOSCOPY (EGD) WITH PROPOFOL N/A 02/10/2020   Procedure: ESOPHAGOGASTRODUODENOSCOPY (EGD) WITH PROPOFOL;  Surgeon: Rogene Houston, MD;  [facility];  Service: Endoscopy;  Laterality: N/A;  250   HAND SURGERY     Right   LUMBAR LAMINECTOMY/DECOMPRESSION MICRODISCECTOMY N/A 08/29/2012   Procedure: LUMBAR LAMINECTOMY/DECOMPRESSION MICRODISCECTOMY 1 LEVEL;  Surgeon: Floyce Stakes, MD;  Location: [facility];  Service: Neurosurgery;  Laterality: N/A;  Lumbar four-five laminectomies   NECK SURGERY     PENILE PROSTHESIS IMPLANT     PENILE PROSTHESIS IMPLANT N/A 08/07/2021   Procedure: PENILE PROTHESIS INFLATABLE;  Surgeon: Cleon Gustin, MD;  Location: [facility];  Service: Urology;  Laterality: N/A;   REMOVAL OF PENILE PROSTHESIS N/A 08/07/2021   Procedure: REMOVAL OF PENILE PROSTHESIS;  Surgeon: Cleon Gustin, MD;  Location: [facility];  Service: Urology;  Laterality: N/A;   ROTATOR CUFF REPAIR     Right   SHOULDER SURGERY     TRANSURETHRAL RESECTION OF PROSTATE N/A 05/25/2019   Procedure: TRANSURETHRAL RESECTION OF THE PROSTATE (TURP);  Surgeon: Cleon Gustin, MD;  Location: [facility];  Service: Urology;  Laterality: N/A;   WRIST SURGERY     Left  WRIST SURGERY      Home Medications:  Allergies as of 08/22/2021       Reactions   Aspirin Other (See Comments)   Higher dosage upsets stomach   Bayer Aspirin [aspirin] Other (See Comments)   Stomach "flares up"        Medication List        Accurate as of August 22, 2021 11:59 PM. If you have any questions, ask your nurse or doctor.          acyclovir 200 MG capsule Commonly known as: ZOVIRAX Take 200 mg by mouth as needed.   amLODipine 10 MG tablet Commonly known as: NORVASC Take 10 mg by mouth daily.    amoxicillin-clavulanate 875-125 MG tablet Commonly known as: AUGMENTIN Take 1 tablet by mouth every 12 (twelve) hours. Started by: Nicolette Bang, MD   bacitracin 500 UNIT/GM ointment Apply 1 application topically 2 (two) times daily. Started by: Nicolette Bang, MD   cetirizine 10 MG tablet Commonly known as: ZYRTEC Take 10 mg by mouth daily.   doxycycline 100 MG capsule Commonly known as: VIBRAMYCIN Take 1 capsule (100 mg total) by mouth 2 (two) times daily.   fluticasone 50 MCG/ACT nasal spray Commonly known as: FLONASE Place 1 spray into both nostrils daily.   gabapentin 100 MG capsule Commonly known as: NEURONTIN Take 1 capsule (100 mg total) by mouth 3 (three) times daily.   glyBURIDE-metformin 5-500 MG tablet Commonly known as: GLUCOVANCE Take 2 tablets by mouth 2 (two) times daily with a meal.   hydrochlorothiazide 12.5 MG tablet Commonly known as: HYDRODIURIL Take 12.5 mg by mouth daily.   Jardiance 25 MG Tabs tablet Generic drug: empagliflozin Take 25 mg by mouth daily.   labetalol 200 MG tablet Commonly known as: NORMODYNE Take 200 mg by mouth 2 (two) times daily.   labetalol 300 MG tablet Commonly known as: NORMODYNE Take 300 mg by mouth 3 (three) times daily.   metFORMIN 1000 MG tablet Commonly known as: GLUCOPHAGE Take 1,000 mg by mouth 2 (two) times daily.   metoprolol succinate 50 MG 24 hr tablet Commonly known as: TOPROL-XL Take 50 mg by mouth daily. Take with or immediately following a meal.   naproxen 500 MG tablet Commonly known as: NAPROSYN Take 500 mg by mouth 2 (two) times daily with a meal.   omeprazole 40 MG capsule Commonly known as: PRILOSEC Take 1 capsule (40 mg total) by mouth daily.   OneTouch Delica Plus [id number] Misc USE TO TEST TWICE DAILY   OneTouch Verio Flex System w/Device Kit USE TO TEST TWICE DAILY   OneTouch Verio test strip Generic drug: glucose blood USE TO TEST TWICE DAILY   oxyCODONE 5 MG  immediate release tablet Commonly known as: Oxy IR/ROXICODONE Take 5 mg by mouth 3 (three) times daily. For pain   oxyCODONE-acetaminophen 5-325 MG tablet Commonly known as: Percocet Take 1 tablet by mouth every 4 (four) hours as needed for severe pain.   pantoprazole 40 MG tablet Commonly known as: PROTONIX Take 40 mg by mouth daily.   polyethylene glycol powder 17 GM/SCOOP powder Commonly known as: GlycoLax Take 17 g by mouth daily.   ramipril 10 MG capsule Commonly known as: ALTACE Take 10 mg by mouth daily.   simvastatin 20 MG tablet Commonly known as: ZOCOR Take 20 mg by mouth daily.   SM All Day Allergy Relief 10 MG tablet Generic drug: loratadine Take 10 mg by mouth at bedtime.   sulfamethoxazole-trimethoprim 800-160 MG  tablet Commonly known as: BACTRIM DS Take 1 tablet by mouth 2 (two) times daily.   tamsulosin 0.4 MG Caps capsule Commonly known as: FLOMAX Take 0.4 mg by mouth daily.        Allergies:  Allergies  Allergen Reactions   Aspirin Other (See Comments)    Higher dosage upsets stomach   Bayer Aspirin [Aspirin] Other (See Comments)    Stomach "flares up"    Family History: Family History  Problem Relation Age of Onset   Heart attack Father 34       MI   Hypertension Father    Heart attack Brother 59       MI   Diabetes Mother    Heart attack Paternal Uncle     Social History:  reports that he has never smoked. He has never used smokeless tobacco. He reports that he does not currently use alcohol. He reports current drug use. Frequency: 3.00 times per week. Drug: Marijuana.  ROS: All other review of systems were reviewed and are negative except what is noted above in HPI  Physical Exam: BP 134/77    Pulse 66   Constitutional:  Alert and oriented, No acute distress. HEENT: [place] AT, moist mucus membranes.  Trachea midline, no masses. Cardiovascular: No clubbing, cyanosis, or edema. Respiratory: Normal respiratory effort, no increased  work of breathing. GI: Abdomen is soft, nontender, nondistended, no abdominal masses GU: No CVA tenderness. Circumcised phallus. No masses/lesions on penis, testis, scrotum. Healing scrotal incision  Lymph: No cervical or inguinal lymphadenopathy. Skin: No rashes, bruises or suspicious lesions. Neurologic: Grossly intact, no focal deficits, moving all 4 extremities. Psychiatric: Normal mood and affect.  Laboratory Data: Lab Results  Component Value Date   WBC 6.4 08/08/2021   HGB 10.1 (L) 08/08/2021   HCT 30.3 (L) 08/08/2021   MCV 93.8 08/08/2021   PLT 143 (L) 08/08/2021    Lab Results  Component Value Date   CREATININE 1.01 08/08/2021    No results found for: PSA  No results found for: TESTOSTERONE  Lab Results  Component Value Date   HGBA1C 8.5 (H) 08/04/2021    Urinalysis    Component Value Date/Time   COLORURINE YELLOW 06/19/2019 0827   APPEARANCEUR Clear 08/22/2021 1433   LABSPEC 1.014 06/19/2019 0827   PHURINE 5.0 06/19/2019 0827   GLUCOSEU 3+ (A) 08/22/2021 1433   HGBUR LARGE (A) 06/19/2019 0827   BILIRUBINUR Negative 08/22/2021 1433   KETONESUR NEGATIVE 06/19/2019 0827   PROTEINUR Negative 08/22/2021 1433   PROTEINUR 100 (A) 06/19/2019 0827   UROBILINOGEN 0.2 11/04/2019 1451   UROBILINOGEN 0.2 05/15/2014 0122   NITRITE Negative 08/22/2021 1433   NITRITE NEGATIVE 06/19/2019 0827   LEUKOCYTESUR Negative 08/22/2021 1433   LEUKOCYTESUR LARGE (A) 06/19/2019 0827    Lab Results  Component Value Date   LABMICR See below: 08/22/2021   WBCUA None seen 08/22/2021   LABEPIT None seen 08/22/2021   MUCUS Present 08/22/2021   BACTERIA Few 08/22/2021    Pertinent Imaging:  No results found for this or any previous visit.  No results found for this or any previous visit.  No results found for this or any previous visit.  No results found for this or any previous visit.  No results found for this or any previous visit.  No results found for this or any  previous visit.  No results found for this or any previous visit.  Results for orders placed during the hospital encounter of 09/20/17  CT  Renal Stone Study  Narrative CLINICAL DATA:  No new urinary tract stone. Low back pain for 2 weeks. Urinary retention and dysuria.  EXAM: CT ABDOMEN AND PELVIS WITHOUT CONTRAST  TECHNIQUE: Multidetector CT imaging of the abdomen and pelvis was performed following the standard protocol without IV contrast.  COMPARISON:  CT of the abdomen and pelvis 01/25/2012. Lumbar spine radiographs 04/24/2017.  FINDINGS: Lower chest: Excellent recall bronchiectasis is again noted at the lung bases bilaterally without superimposed airspace disease. The heart size is normal. No significant pleural or pericardial effusion is present.  Hepatobiliary: No focal liver abnormality is seen. No gallstones, gallbladder wall thickening, or biliary dilatation.  Pancreas: Unremarkable. No pancreatic ductal dilatation or surrounding inflammatory changes.  Spleen: Calcification along the lateral margin of the spleen is stable. No focal lesions are evident.  Adrenals/Urinary Tract: Adrenal glands are normal bilaterally. No stone or mass lesion is present. There is no obstruction. The ureters are within normal limits bilaterally. The urinary bladder is within normal limits.  Stomach/Bowel: The stomach and duodenum are within normal limits. Small bowel is unremarkable. The terminal ileum is within normal limits. The appendix is visualized and normal.  Vascular/Lymphatic: Atherosclerotic calcifications are present without aneurysm.  Reproductive: A penile prosthesis is noted. The prostate gland is somewhat prominent and indents the inferior margin of the urinary bladder. It measures 4 cm in transverse diameter.  Other: No abdominal wall hernia or abnormality. No abdominopelvic ascites.  Musculoskeletal: Progressive endplate degenerative changes and sclerosis  are present at L3-4, L4-5, and L5-S1. A transitional S1 segment is again noted. Foraminal narrowing is evident bilaterally at L4-5 and L5-S1.  IMPRESSION: 1. No urinary tract obstruction or stone. 2. Prominent prostate gland indents the inferior border of the urinary bladder. 3. progressive degenerative changes in the lower lumbar spine. 4. Penile prosthesis.   Electronically Signed By: San Morelle [name] On: 09/20/2017 16:28   Assessment & Plan:    1. Malfunction of penile prosthesis, initial encounter (HCC) -bacitracin BID to incision -Augmentin 875 BID for 7 days - Urinalysis, Routine w reflex microscopic   Return in about 4 weeks (around 09/19/2021).  Nicolette Bang, MD  [facility] [place]

## 2021-09-06 ENCOUNTER — Encounter: Payer: Self-pay | Admitting: Urology

## 2021-09-06 ENCOUNTER — Other Ambulatory Visit: Payer: Self-pay

## 2021-09-06 ENCOUNTER — Ambulatory Visit (INDEPENDENT_AMBULATORY_CARE_PROVIDER_SITE_OTHER): Payer: Medicare HMO | Admitting: Urology

## 2021-09-06 VITALS — BP 142/86 | HR 82

## 2021-09-06 DIAGNOSIS — T83490A Other mechanical complication of penile (implanted) prosthesis, initial encounter: Secondary | ICD-10-CM

## 2021-09-06 MED ORDER — OXYCODONE HCL 5 MG PO TABS: 5.0000 mg | ORAL_TABLET | Freq: Three times a day (TID) | ORAL | 0 refills | Status: DC

## 2021-09-06 MED ORDER — DOXYCYCLINE HYCLATE 100 MG PO CAPS: 100.0000 mg | ORAL_CAPSULE | Freq: Two times a day (BID) | ORAL | 0 refills | Status: DC

## 2021-09-06 MED ORDER — BACITRACIN 500 UNIT/GM EX OINT: 1.0000 "application " | TOPICAL_OINTMENT | Freq: Two times a day (BID) | CUTANEOUS | 1 refills | Status: DC

## 2021-09-06 NOTE — Patient Instructions (Signed)
Wound Care, Adult ?Taking care of your wound properly can help to prevent pain, infection, and scarring. It can also help your wound heal more quickly. Follow instructions from your health care provider about how to care for your wound. ?Supplies needed: ?Soap and water. ?Wound cleanser, saline, or germ-free (sterile) water. ?Gauze. ?If needed, a clean bandage (dressing) or other type of wound dressing material to cover or place in the wound. Follow your health care provider's instructions about what dressing supplies to use. ?Cream or topical ointment to apply to the wound, if told by your health care provider. ?How to care for your wound ?Cleaning the wound ?Ask your health care provider how to clean the wound. This may include: ?Using mild soap and water, a wound cleanser, saline, or sterile water. ?Using a clean gauze to pat the wound dry after cleaning it. Do not rub or scrub the wound. ?Dressing care ?Wash your hands with soap and water for at least 20 seconds before and after you change the dressing. If soap and water are not available, use hand sanitizer. ?Change your dressing as told by your health care provider. This may include: ?Cleaning or rinsing out (irrigating) the wound. ?Application of cream or topical ointment, if told by your health care provider. ?Placing a dressing over the wound or in the wound (packing). ?Covering the wound with an outer dressing. ?Leave stitches (sutures), staples, skin glue, or adhesive strips in place. These skin closures may need to stay in place for 2 weeks or longer. If adhesive strip edges start to loosen and curl up, you may trim the loose edges. Do not remove adhesive strips completely unless your health care provider tells you to do that. ?Ask your health care provider when you can leave the wound uncovered. ?Checking for infection ?Check your wound area every day for signs of infection. Check for: ?More redness, swelling, or pain. ?Fluid or blood. ?Warmth. ?Pus or  a bad smell. ? ?Follow these instructions at home ?Medicines ?If you were prescribed an antibiotic medicine, cream, or ointment, take or apply it as told by your health care provider. Do not stop using the antibiotic even if your condition improves. ?If you were prescribed pain medicine, take it 30 minutes before you do any wound care or as told by your health care provider. ?Take over-the-counter and prescription medicines only as told by your health care provider. ?Eating and drinking ?Eat a diet that includes protein, vitamin A, vitamin C, and other nutrient-rich foods to help the wound heal. ?Foods rich in protein include meat, fish, eggs, dairy, beans, and nuts. ?Foods rich in vitamin A include carrots and dark green, leafy vegetables. ?Foods rich in vitamin C include citrus fruits, tomatoes, broccoli, and peppers. ?Drink enough fluid to keep your urine pale yellow. ?General instructions ?Do not take baths, swim, or use a hot tub until your health care provider approves. Ask your health care provider if you may take showers. You may only be allowed to take sponge baths. ?Do not scratch or pick at the wound. Keep it covered as told by your health care provider. ?Return to your normal activities as told by your health care provider. Ask your health care provider what activities are safe for you. ?Protect your wound from the sun when you are outside for the first 6 months, or for as long as told by your health care provider. Cover up the scar area or apply sunscreen that has an SPF of at least 30. ?Do not   use any products that contain nicotine or tobacco. These products include cigarettes, chewing tobacco, and vaping devices, such as e-cigarettes. If you need help quitting, ask your health care provider. ?Keep all follow-up visits. This is important. ?Contact a health care provider if: ?You received a tetanus shot and you have swelling, severe pain, redness, or bleeding at the injection site. ?Your pain is not  controlled with medicine. ?You have any of these signs of infection: ?More redness, swelling, or pain around the wound. ?Fluid or blood coming from the wound. ?Warmth coming from the wound. ?A fever or chills. ?You are nauseous or you vomit. ?You are dizzy. ?You have a new rash or hardness around the wound. ?Get help right away if: ?You have a red streak of skin near the area around your wound. ?Pus or a bad smell coming from the wound. ?Your wound has been closed with staples, sutures, skin glue, or adhesive strips and it begins to open up and separate. ?Your wound is bleeding, and the bleeding does not stop with gentle pressure. ?These symptoms may represent a serious problem that is an emergency. Do not wait to see if the symptoms will go away. Get medical help right away. Call your local emergency services (911 in the [place] Do not drive yourself to the hospital. ?Summary ?Always wash your hands with soap and water for at least 20 seconds before and after changing your dressing. ?Change your dressing as told by your health care provider. ?To help with healing, eat foods that are rich in protein, vitamin A, vitamin C, and other nutrients. ?Check your wound every day for signs of infection. Contact your health care provider if you think that your wound is infected. ?This information is not intended to replace advice given to you by your health care provider. Make sure you discuss any questions you have with your health care provider. ?Document Revised: 11/01/2020 Document Reviewed: 11/01/2020 ?Elsevier Patient Education ? 2022 [employer] ? ?

## 2021-09-06 NOTE — Progress Notes (Signed)
09/06/2021 2:19 PM   [name] January 15, 1941 [id number]  Referring provider: Carrolyn Meiers, MD [place],   [id number]  Scrotal incision drainage   HPI: [name] is a 81yo here for followup after IPP placement. He notes blood tinged drainage from his incision. No worsening scortal pain. No swelling. No induration. No fevers   PMH: Past Medical History:  Diagnosis Date   Arthritis    fingers, back   Chronic back pain    Diabetes mellitus Since 1995   Takes Glucovance and Onglyza daily   Diabetes mellitus without complication ([place])    GERD (gastroesophageal reflux disease)    takes Dexilant and Omeprazole daily   Herpes    History of colon polyps    History of gastric ulcer 25+yrs ago   Hyperlipidemia Since 1995   takes Simvastatin daily   Hypertension Since 1995   takes Metoprolol and Ramipril daily   Hypertension    Pneumonia    hx of;as a child   Sleep apnea    doesn't use CPAP   Urinary frequency    Urinary urgency    takes Flomax daily    Surgical History: Past Surgical History:  Procedure Laterality Date   BACK SURGERY  1962   Lumbar   BACK SURGERY     bilateral cataract surgery     CARDIAC CATHETERIZATION  02/16/10   CERVICAL SPINE SURGERY     COLONOSCOPY     COLONOSCOPY N/A 04/23/2013   Procedure: COLONOSCOPY;  Surgeon: Barry Houston, MD;  [facility];  Service: Endoscopy;  Laterality: N/A;  1030   COLONOSCOPY N/A 07/24/2018   Procedure: COLONOSCOPY;  Surgeon: Barry Houston, MD;  [facility];  Service: Endoscopy;  Laterality: N/A;  8:30   ESOPHAGEAL DILATION N/A 02/10/2020   Procedure: ESOPHAGEAL DILATION;  Surgeon: Barry Houston, MD;  [facility];  Service: Endoscopy;  Laterality: N/A;   ESOPHAGOGASTRODUODENOSCOPY N/A 12/24/2014   Procedure: ESOPHAGOGASTRODUODENOSCOPY (EGD);  Surgeon: Barry Houston, MD;  [facility];  Service: Endoscopy;  Laterality:  N/A;  2:45   ESOPHAGOGASTRODUODENOSCOPY (EGD) WITH ESOPHAGEAL DILATION     ESOPHAGOGASTRODUODENOSCOPY (EGD) WITH PROPOFOL N/A 02/10/2020   Procedure: ESOPHAGOGASTRODUODENOSCOPY (EGD) WITH PROPOFOL;  Surgeon: Barry Houston, MD;  [facility];  Service: Endoscopy;  Laterality: N/A;  250   HAND SURGERY     Right   LUMBAR LAMINECTOMY/DECOMPRESSION MICRODISCECTOMY N/A 08/29/2012   Procedure: LUMBAR LAMINECTOMY/DECOMPRESSION MICRODISCECTOMY 1 LEVEL;  Surgeon: Barry Stakes, MD;  Location: [facility];  Service: Neurosurgery;  Laterality: N/A;  Lumbar four-five laminectomies   NECK SURGERY     PENILE PROSTHESIS IMPLANT     PENILE PROSTHESIS IMPLANT N/A 08/07/2021   Procedure: PENILE PROTHESIS INFLATABLE;  Surgeon: Barry Gustin, MD;  Location: [facility];  Service: Urology;  Laterality: N/A;   REMOVAL OF PENILE PROSTHESIS N/A 08/07/2021   Procedure: REMOVAL OF PENILE PROSTHESIS;  Surgeon: Barry Gustin, MD;  Location: [facility];  Service: Urology;  Laterality: N/A;   ROTATOR CUFF REPAIR     Right   SHOULDER SURGERY     TRANSURETHRAL RESECTION OF PROSTATE N/A 05/25/2019   Procedure: TRANSURETHRAL RESECTION OF THE PROSTATE (TURP);  Surgeon: Barry Gustin, MD;  Location: [facility];  Service: Urology;  Laterality: N/A;   WRIST SURGERY     Left   WRIST SURGERY      Home Medications:  Allergies as of  09/06/2021       Reactions   Aspirin Other (See Comments)   Higher dosage upsets stomach   Bayer Aspirin [aspirin] Other (See Comments)   Stomach "flares up"        Medication List        Accurate as of September 06, 2021  2:19 PM. If you have any questions, ask your nurse or doctor.          acyclovir 200 MG capsule Commonly known as: ZOVIRAX Take 200 mg by mouth as needed.   amLODipine 10 MG tablet Commonly known as: NORVASC Take 10 mg by mouth daily.   amoxicillin-clavulanate 875-125 MG tablet Commonly known as: AUGMENTIN Take 1 tablet by mouth every 12  (twelve) hours.   bacitracin 500 UNIT/GM ointment Apply 1 application topically 2 (two) times daily.   cetirizine 10 MG tablet Commonly known as: ZYRTEC Take 10 mg by mouth daily.   doxycycline 100 MG capsule Commonly known as: VIBRAMYCIN Take 1 capsule (100 mg total) by mouth 2 (two) times daily.   fluticasone 50 MCG/ACT nasal spray Commonly known as: FLONASE Place 1 spray into both nostrils daily.   gabapentin 100 MG capsule Commonly known as: NEURONTIN Take 1 capsule (100 mg total) by mouth 3 (three) times daily.   glyBURIDE-metformin 5-500 MG tablet Commonly known as: GLUCOVANCE Take 2 tablets by mouth 2 (two) times daily with a meal.   hydrochlorothiazide 12.5 MG tablet Commonly known as: HYDRODIURIL Take 12.5 mg by mouth daily.   Jardiance 25 MG Tabs tablet Generic drug: empagliflozin Take 25 mg by mouth daily.   labetalol 200 MG tablet Commonly known as: NORMODYNE Take 200 mg by mouth 2 (two) times daily.   labetalol 300 MG tablet Commonly known as: NORMODYNE Take 300 mg by mouth 3 (three) times daily.   metFORMIN 1000 MG tablet Commonly known as: GLUCOPHAGE Take 1,000 mg by mouth 2 (two) times daily.   metoprolol succinate 50 MG 24 hr tablet Commonly known as: TOPROL-XL Take 50 mg by mouth daily. Take with or immediately following a meal.   naproxen 500 MG tablet Commonly known as: NAPROSYN Take 500 mg by mouth 2 (two) times daily with a meal.   omeprazole 40 MG capsule Commonly known as: PRILOSEC Take 1 capsule (40 mg total) by mouth daily.   OneTouch Delica Plus [id number] Misc USE TO TEST TWICE DAILY   OneTouch Verio Flex System w/Device Kit USE TO TEST TWICE DAILY   OneTouch Verio test strip Generic drug: glucose blood USE TO TEST TWICE DAILY   oxyCODONE 5 MG immediate release tablet Commonly known as: Oxy IR/ROXICODONE Take 5 mg by mouth 3 (three) times daily. For pain   oxyCODONE-acetaminophen 5-325 MG tablet Commonly known as:  Percocet Take 1 tablet by mouth every 4 (four) hours as needed for severe pain.   pantoprazole 40 MG tablet Commonly known as: PROTONIX Take 40 mg by mouth daily.   polyethylene glycol powder 17 GM/SCOOP powder Commonly known as: GlycoLax Take 17 g by mouth daily.   ramipril 10 MG capsule Commonly known as: ALTACE Take 10 mg by mouth daily.   simvastatin 20 MG tablet Commonly known as: ZOCOR Take 20 mg by mouth daily.   SM All Day Allergy Relief 10 MG tablet Generic drug: loratadine Take 10 mg by mouth at bedtime.   sulfamethoxazole-trimethoprim 800-160 MG tablet Commonly known as: BACTRIM DS Take 1 tablet by mouth 2 (two) times daily.   tamsulosin 0.4 MG Caps capsule  Commonly known as: FLOMAX Take 0.4 mg by mouth daily.        Allergies:  Allergies  Allergen Reactions   Aspirin Other (See Comments)    Higher dosage upsets stomach   Bayer Aspirin [Aspirin] Other (See Comments)    Stomach "flares up"    Family History: Family History  Problem Relation Age of Onset   Heart attack Father 76       MI   Hypertension Father    Heart attack Brother 52       MI   Diabetes Mother    Heart attack Paternal Uncle     Social History:  reports that he has never smoked. He has never used smokeless tobacco. He reports that he does not currently use alcohol. He reports current drug use. Frequency: 3.00 times per week. Drug: Marijuana.  ROS: All other review of systems were reviewed and are negative except what is noted above in HPI  Physical Exam: BP (!) 142/86    Pulse 82   Constitutional:  Alert and oriented, No acute distress. HEENT: [place] AT, moist mucus membranes.  Trachea midline, no masses. Cardiovascular: No clubbing, cyanosis, or edema. Respiratory: Normal respiratory effort, no increased work of breathing. GI: Abdomen is soft, nontender, nondistended, no abdominal masses GU: No CVA tenderness. Circumcised phallus. Superficial scrotal incision separation. Area  is clean, no purulent drainage Lymph: No cervical or inguinal lymphadenopathy. Skin: No rashes, bruises or suspicious lesions. Neurologic: Grossly intact, no focal deficits, moving all 4 extremities. Psychiatric: Normal mood and affect.  Laboratory Data: Lab Results  Component Value Date   WBC 6.4 08/08/2021   HGB 10.1 (L) 08/08/2021   HCT 30.3 (L) 08/08/2021   MCV 93.8 08/08/2021   PLT 143 (L) 08/08/2021    Lab Results  Component Value Date   CREATININE 1.01 08/08/2021    No results found for: PSA  No results found for: TESTOSTERONE  Lab Results  Component Value Date   HGBA1C 8.5 (H) 08/04/2021    Urinalysis    Component Value Date/Time   COLORURINE YELLOW 06/19/2019 0827   APPEARANCEUR Clear 08/22/2021 1433   LABSPEC 1.014 06/19/2019 0827   PHURINE 5.0 06/19/2019 0827   GLUCOSEU 3+ (A) 08/22/2021 1433   HGBUR LARGE (A) 06/19/2019 0827   BILIRUBINUR Negative 08/22/2021 1433   KETONESUR NEGATIVE 06/19/2019 0827   PROTEINUR Negative 08/22/2021 1433   PROTEINUR 100 (A) 06/19/2019 0827   UROBILINOGEN 0.2 11/04/2019 1451   UROBILINOGEN 0.2 05/15/2014 0122   NITRITE Negative 08/22/2021 1433   NITRITE NEGATIVE 06/19/2019 0827   LEUKOCYTESUR Negative 08/22/2021 1433   LEUKOCYTESUR LARGE (A) 06/19/2019 0827    Lab Results  Component Value Date   LABMICR See below: 08/22/2021   WBCUA None seen 08/22/2021   LABEPIT None seen 08/22/2021   MUCUS Present 08/22/2021   BACTERIA Few 08/22/2021    Pertinent Imaging:  No results found for this or any previous visit.  No results found for this or any previous visit.  No results found for this or any previous visit.  No results found for this or any previous visit.  No results found for this or any previous visit.  No results found for this or any previous visit.  No results found for this or any previous visit.  Results for orders placed during the hospital encounter of 09/20/17  CT Renal Stone  Study  Narrative CLINICAL DATA:  No new urinary tract stone. Low back pain for 2 weeks. Urinary retention  and dysuria.  EXAM: CT ABDOMEN AND PELVIS WITHOUT CONTRAST  TECHNIQUE: Multidetector CT imaging of the abdomen and pelvis was performed following the standard protocol without IV contrast.  COMPARISON:  CT of the abdomen and pelvis 01/25/2012. Lumbar spine radiographs 04/24/2017.  FINDINGS: Lower chest: Excellent recall bronchiectasis is again noted at the lung bases bilaterally without superimposed airspace disease. The heart size is normal. No significant pleural or pericardial effusion is present.  Hepatobiliary: No focal liver abnormality is seen. No gallstones, gallbladder wall thickening, or biliary dilatation.  Pancreas: Unremarkable. No pancreatic ductal dilatation or surrounding inflammatory changes.  Spleen: Calcification along the lateral margin of the spleen is stable. No focal lesions are evident.  Adrenals/Urinary Tract: Adrenal glands are normal bilaterally. No stone or mass lesion is present. There is no obstruction. The ureters are within normal limits bilaterally. The urinary bladder is within normal limits.  Stomach/Bowel: The stomach and duodenum are within normal limits. Small bowel is unremarkable. The terminal ileum is within normal limits. The appendix is visualized and normal.  Vascular/Lymphatic: Atherosclerotic calcifications are present without aneurysm.  Reproductive: A penile prosthesis is noted. The prostate gland is somewhat prominent and indents the inferior margin of the urinary bladder. It measures 4 cm in transverse diameter.  Other: No abdominal wall hernia or abnormality. No abdominopelvic ascites.  Musculoskeletal: Progressive endplate degenerative changes and sclerosis are present at L3-4, L4-5, and L5-S1. A transitional S1 segment is again noted. Foraminal narrowing is evident bilaterally at L4-5 and  L5-S1.  IMPRESSION: 1. No urinary tract obstruction or stone. 2. Prominent prostate gland indents the inferior border of the urinary bladder. 3. progressive degenerative changes in the lower lumbar spine. 4. Penile prosthesis.   Electronically Signed By: San Morelle [name] On: 09/20/2017 16:28   Assessment & Plan:    Scrotal incision separation -Continue bacitracin BID -doxycyline 178m BID for 14 days  No follow-ups on file.  PNicolette Bang MD  [facility]

## 2021-09-12 ENCOUNTER — Other Ambulatory Visit: Payer: Self-pay | Admitting: Urology

## 2021-09-25 ENCOUNTER — Encounter: Payer: Self-pay | Admitting: Urology

## 2021-09-25 ENCOUNTER — Other Ambulatory Visit: Payer: Self-pay

## 2021-09-25 ENCOUNTER — Ambulatory Visit (INDEPENDENT_AMBULATORY_CARE_PROVIDER_SITE_OTHER): Payer: Medicare HMO | Admitting: Urology

## 2021-09-25 VITALS — BP 159/65 | HR 78

## 2021-09-25 DIAGNOSIS — T83490A Other mechanical complication of penile (implanted) prosthesis, initial encounter: Secondary | ICD-10-CM

## 2021-09-25 LAB — URINALYSIS, ROUTINE W REFLEX MICROSCOPIC
Bilirubin, UA: NEGATIVE
Ketones, UA: NEGATIVE
Leukocytes,UA: NEGATIVE
Nitrite, UA: NEGATIVE
Specific Gravity, UA: >1.03 — ABNORMAL HIGH (ref 1.005–1.030)
Urobilinogen, Ur: 0.2 mg/dL (ref 0.2–1.0)
pH, UA: 5.5 (ref 5.0–7.5)

## 2021-09-25 LAB — MICROSCOPIC EXAMINATION: Renal Epithel, UA: NONE SEEN /hpf

## 2021-09-25 NOTE — Patient Instructions (Signed)
Penile Prosthesis Implantation ?Penile prosthesis implantation is a procedure to place a device into the penis. The device is used to treat problems with having or keeping an erection (erectile dysfunction). There are two main types of devices that can be put in during the procedure: malleable semi-rigid penile implants and inflatable (hydraulic) penile implants. ?Malleable semi-rigid penile implant ?A malleable semi-rigid penile implant, also called a non-hydraulic penile implant, consists of two silicone rubber rods. The rods provide some hardness (rigidity). They are also flexible, so the penis can curve downward in its normal position and become straight for sex. ?Inflatable penile implant ?An inflatable penile implant, also called a hydraulic penile implant, consists of two- or three-piece inflatable devices. These devices include one or two cylinders in the shaft of the penis, a pump located in the scrotum, and a part that stores water in the abdomen (abdominal reservoir). The cylinders are inflated with normal salt water (saline) that helps to create an erection, and they can be deflated after sex. There are several types of inflatable implants. ?Tell a health care provider about: ?Any allergies you have. ?All medicines you are taking, including vitamins, herbs, eye drops, creams, and over-the-counter medicines. ?Any problems you or family members have had with anesthetic medicines. ?Any blood disorders you have. ?Any surgeries you have had. ?Any medical conditions you have. ?What are the risks? ?Generally, this is a safe procedure. However, problems may occur, including: ?Infection in the penis. If this happens, the implant may need to be removed. ?Allergic reactions to medicines. ?Damage to nearby structures or organs, such as to the urethra. This is the part of the body that drains urine from the bladder. ?Not enough blood reaching the penis. This is rare. If this happens, the implant will need to be  removed. ?What happens before the procedure? ?Staying hydrated ?Follow instructions from your health care provider about hydration, which may include: ?Up to 2 hours before the procedure - you may continue to drink clear liquids, such as water, clear fruit juice, black coffee, and plain tea. ? ?Eating and drinking restrictions ?Follow instructions from your health care provider about eating and drinking, which may include: ?8 hours before the procedure - stop eating heavy meals or foods, such as meat, fried foods, or fatty foods. ?6 hours before the procedure - stop eating light meals or foods, such as toast or cereal. ?6 hours before the procedure - stop drinking milk or drinks that contain milk. ?2 hours before the procedure - stop drinking clear liquids. ?Medicines ?Ask your health care provider about: ?Changing or stopping your regular medicines. This is especially important if you are taking diabetes medicines or blood thinners. ?Taking medicines such as aspirin and ibuprofen. These medicines can thin your blood. Do not take these medicines unless your health care provider tells you to take them. ?Taking over-the-counter medicines, vitamins, herbs, and supplements. ?General instructions ?Do not use any products that contain nicotine or tobacco for at least 4 weeks before the procedure. These products include cigarettes, e-cigarettes, and chewing tobacco. If you need help quitting, ask your health care provider. ?Plan to have someone take you home from the hospital or clinic. ?If you will be going home right after the procedure, plan to have someone with you for 24 hours. ?You may be asked to shower with a germ-killing soap. ?Ask your health care provider: ?How your surgery site will be marked. ?What steps will be taken to help prevent infection. These steps may include: ?Removing hair at  the surgery site. ?Washing skin with a germ-killing soap. ?Taking antibiotic medicine. ?What happens during the  procedure? ?An IV will be inserted into one of your veins. ?You will be given one or both of the following: ?A medicine to help you relax (sedative). ?A medicine to make you fall asleep (general anesthetic). ?A medicine that is injected into your spine to numb the area below and slightly above the injection site (spinal anesthetic). ?A flexible tube called a catheter may be inserted through the tip of your penis and into your urethra and bladder. The catheter drains urine during the procedure and helps your surgeon easily locate your urethra. ?A small incision will be made in your scrotum or in your penis, just below the head of your penis. ?The rods or cylinders of the prosthesis will be placed into the shaft of your penis. ?For an inflatable penile implant: ?Incisions will also be made in your abdomen and scrotum. The reservoir and pump will be inserted in these incisions. ?The cylinders, reservoir, and pump will be joined by tubes and tested to ensure the fluid flows from one area to the other. ?Your incision will be closed with dissolvable stitches (sutures). Skin glue or adhesive strips may also be used to close your incision. ?A bandage (dressing) will be applied to your incision. ?You may be fitted with a device similar to a jock strap or underwear with a supportive pouch (scrotal support) to relieve pressure on the incision area. ?The procedure may vary among health care providers and hospitals. ?What happens after the procedure? ?Your blood pressure, heart rate, breathing rate, and blood oxygen level will be monitored until you leave the hospital or clinic. ?If you have a catheter in place, it may stay in place until the day after the procedure. ?You may be given antibiotics and pain medicines as needed. ?You may need to follow a clear liquid diet for the first 24 hours after the procedure. ?You may be encouraged to sit up and walk around. ?A towel roll or an ice pack may be placed under your scrotum to  help reduce swelling. ?If you were given a sedative during the procedure, it can affect you for several hours. Do not drive or operate machinery until your health care provider says that it is safe. ?Summary ?Penile prosthesis implantation is a procedure to place a device into the penis. The device is used to treat problems with having or keeping an erection (erectile dysfunction). ?There are two main types of penile implants that can be put in during this procedure: malleable semi-rigid types and inflatable types. ?After the procedure, you may be fitted with a device similar to a jock strap or underwear with a supportive pouch (scrotal support) to relieve pressure on the incision area. ?This information is not intended to replace advice given to you by your health care provider. Make sure you discuss any questions you have with your health care provider. ?Document Revised: 06/29/2019 Document Reviewed: 06/29/2019 ?Elsevier Patient Education ? [place]. ? ?

## 2021-09-25 NOTE — Progress Notes (Signed)
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 His scrotal incision has healed well. He denies nay swelling of the scrotum and no drainage from the scrotum. He is here to cycle the device.   PMH: Past Medical History:  Diagnosis Date   Arthritis    fingers, back   Chronic back pain    Diabetes mellitus Since 1995   Takes Glucovance and Onglyza daily   Diabetes mellitus without complication (HCC)    GERD (gastroesophageal reflux disease)    takes Dexilant and Omeprazole daily   Herpes    History of colon polyps    History of gastric ulcer 25+yrs ago   Hyperlipidemia Since 1995   takes Simvastatin daily   Hypertension Since 1995   takes Metoprolol and Ramipril daily   Hypertension    Pneumonia    hx of;as a child   Sleep apnea    doesn't use CPAP   Urinary frequency    Urinary urgency    takes Flomax daily    Surgical History: Past Surgical History:  Procedure Laterality Date   BACK SURGERY  1962   Lumbar   BACK SURGERY     bilateral cataract surgery     CARDIAC CATHETERIZATION  02/16/10   CERVICAL SPINE SURGERY     COLONOSCOPY     COLONOSCOPY N/A 04/23/2013   Procedure: COLONOSCOPY;  Surgeon: Malissa Hippo, MD;  [facility];  Service: Endoscopy;  Laterality: N/A;  1030   COLONOSCOPY N/A 07/24/2018   Procedure: COLONOSCOPY;  Surgeon: Malissa Hippo, MD;  [facility];  Service: Endoscopy;  Laterality: N/A;  8:30   ESOPHAGEAL DILATION N/A 02/10/2020   Procedure: ESOPHAGEAL DILATION;  Surgeon: Malissa Hippo, MD;  [facility];  Service: Endoscopy;  Laterality: N/A;   ESOPHAGOGASTRODUODENOSCOPY N/A 12/24/2014   Procedure: ESOPHAGOGASTRODUODENOSCOPY (EGD);  Surgeon: Malissa Hippo, MD;  [facility];   Service: Endoscopy;  Laterality: N/A;  2:45   ESOPHAGOGASTRODUODENOSCOPY (EGD) WITH ESOPHAGEAL DILATION     ESOPHAGOGASTRODUODENOSCOPY (EGD) WITH PROPOFOL N/A 02/10/2020   Procedure: ESOPHAGOGASTRODUODENOSCOPY (EGD) WITH PROPOFOL;  Surgeon: Malissa Hippo, MD;  [facility];  Service: Endoscopy;  Laterality: N/A;  250   HAND SURGERY     Right   LUMBAR LAMINECTOMY/DECOMPRESSION MICRODISCECTOMY N/A 08/29/2012   Procedure: LUMBAR LAMINECTOMY/DECOMPRESSION MICRODISCECTOMY 1 LEVEL;  Surgeon: Karn Cassis, MD;  [facility];  Service: Neurosurgery;  Laterality: N/A;  Lumbar four-five laminectomies   NECK SURGERY     PENILE PROSTHESIS IMPLANT     PENILE PROSTHESIS IMPLANT N/A 08/07/2021   Procedure: PENILE PROTHESIS INFLATABLE;  Surgeon: Malen Gauze, MD;  [facility]: [facility];  Service: Urology;  Laterality: N/A;   REMOVAL OF PENILE PROSTHESIS N/A 08/07/2021   Procedure: REMOVAL OF PENILE PROSTHESIS;  Surgeon: Malen Gauze, MD;  [facility]: [facility];  Service: Urology;  Laterality: N/A;   ROTATOR CUFF REPAIR     Right   SHOULDER SURGERY     TRANSURETHRAL RESECTION OF PROSTATE N/A 05/25/2019   Procedure: TRANSURETHRAL RESECTION OF THE PROSTATE (TURP);  Surgeon: Malen Gauze, MD;  [facility]: [facility];  Service: Urology;  Laterality: N/A;   WRIST SURGERY     Left   WRIST SURGERY  Home Medications:  Allergies as of 09/25/2021       Reactions   Aspirin Other (See Comments)   Higher dosage upsets stomach   Bayer Aspirin [aspirin] Other (See Comments)   Stomach "flares up"        Medication List        Accurate as of September 25, 2021 10:47 AM. If you have any questions, ask your nurse or doctor.          acyclovir 200 MG capsule Commonly known as: ZOVIRAX Take 200 mg by mouth as needed.   amLODipine 10 MG tablet Commonly known as: NORVASC Take 10 mg by mouth daily.   amoxicillin-clavulanate 875-125 MG tablet Commonly known as: AUGMENTIN Take  1 tablet by mouth every 12 (twelve) hours.   bacitracin 500 UNIT/GM ointment Apply 1 application topically 2 (two) times daily.   cetirizine 10 MG tablet Commonly known as: ZYRTEC Take 10 mg by mouth daily.   doxycycline 100 MG capsule Commonly known as: VIBRAMYCIN Take 1 capsule (100 mg total) by mouth 2 (two) times daily.   doxycycline 100 MG capsule Commonly known as: VIBRAMYCIN Take 1 capsule (100 mg total) by mouth every 12 (twelve) hours.   fluticasone 50 MCG/ACT nasal spray Commonly known as: FLONASE Place 1 spray into both nostrils daily.   gabapentin 100 MG capsule Commonly known as: NEURONTIN Take 1 capsule (100 mg total) by mouth 3 (three) times daily.   glyBURIDE-metformin 5-500 MG tablet Commonly known as: GLUCOVANCE Take 2 tablets by mouth 2 (two) times daily with a meal.   hydrochlorothiazide 12.5 MG tablet Commonly known as: HYDRODIURIL Take 12.5 mg by mouth daily.   Jardiance 25 MG Tabs tablet Generic drug: empagliflozin Take 25 mg by mouth daily.   labetalol 200 MG tablet Commonly known as: NORMODYNE Take 200 mg by mouth 2 (two) times daily.   labetalol 300 MG tablet Commonly known as: NORMODYNE Take 300 mg by mouth 3 (three) times daily.   metFORMIN 1000 MG tablet Commonly known as: GLUCOPHAGE Take 1,000 mg by mouth 2 (two) times daily.   metoprolol succinate 50 MG 24 hr tablet Commonly known as: TOPROL-XL Take 50 mg by mouth daily. Take with or immediately following a meal.   naproxen 500 MG tablet Commonly known as: NAPROSYN Take 500 mg by mouth 2 (two) times daily with a meal.   omeprazole 40 MG capsule Commonly known as: PRILOSEC Take 1 capsule (40 mg total) by mouth daily.   OneTouch Delica Plus Lancet33G Misc USE TO TEST TWICE DAILY   OneTouch Verio Flex System w/Device Kit USE TO TEST TWICE DAILY   OneTouch Verio test strip Generic drug: glucose blood USE TO TEST TWICE DAILY   oxyCODONE 5 MG immediate release  tablet Commonly known as: Oxy IR/ROXICODONE Take 1 tablet (5 mg total) by mouth 3 (three) times daily. For pain   oxyCODONE-acetaminophen 5-325 MG tablet Commonly known as: Percocet Take 1 tablet by mouth every 4 (four) hours as needed for severe pain.   pantoprazole 40 MG tablet Commonly known as: PROTONIX Take 40 mg by mouth daily.   polyethylene glycol powder 17 GM/SCOOP powder Commonly known as: GlycoLax Take 17 g by mouth daily.   ramipril 10 MG capsule Commonly known as: ALTACE Take 10 mg by mouth daily.   simvastatin 20 MG tablet Commonly known as: ZOCOR Take 20 mg by mouth daily.   SM All Day Allergy Relief 10 MG tablet Generic drug: loratadine Take 10 mg by  mouth at bedtime.   sulfamethoxazole-trimethoprim 800-160 MG tablet Commonly known as: BACTRIM DS Take 1 tablet by mouth 2 (two) times daily.   tamsulosin 0.4 MG Caps capsule Commonly known as: FLOMAX Take 0.4 mg by mouth daily.        Allergies:  Allergies  Allergen Reactions   Aspirin Other (See Comments)    Higher dosage upsets stomach   Bayer Aspirin [Aspirin] Other (See Comments)    Stomach "flares up"    Family History: Family History  Problem Relation Age of Onset   Heart attack Father 90       MI   Hypertension Father    Heart attack Brother 8       MI   Diabetes Mother    Heart attack Paternal Uncle     Social History:  reports that he has never smoked. He has never used smokeless tobacco. He reports that he does not currently use alcohol. He reports current drug use. Frequency: 3.00 times per week. Drug: Marijuana.  ROS: All other review of systems were reviewed and are negative except what is noted above in HPI  Physical Exam: BP (!) 159/65   Pulse 78   Constitutional:  Alert and oriented, No acute distress. HEENT: [place] AT, moist mucus membranes.  Trachea midline, no masses. Cardiovascular: No clubbing, cyanosis, or edema. Respiratory: Normal respiratory effort, no  increased work of breathing. GI: Abdomen is soft, nontender, nondistended, no abdominal masses GU: No CVA tenderness. Circumcised phallus. No masses/lesions on penis, testis, scrotum. Prostate 40g smooth no nodules no induration.  Lymph: No cervical or inguinal lymphadenopathy. Skin: No rashes, bruises or suspicious lesions. Neurologic: Grossly intact, no focal deficits, moving all 4 extremities. Psychiatric: Normal mood and affect.  Laboratory Data: Lab Results  Component Value Date   WBC 6.4 08/08/2021   HGB 10.1 (L) 08/08/2021   HCT 30.3 (L) 08/08/2021   MCV 93.8 08/08/2021   PLT 143 (L) 08/08/2021    Lab Results  Component Value Date   CREATININE 1.01 08/08/2021    No results found for: PSA  No results found for: TESTOSTERONE  Lab Results  Component Value Date   HGBA1C 8.5 (H) 08/04/2021    Urinalysis    Component Value Date/Time   COLORURINE YELLOW 06/19/2019 0827   APPEARANCEUR Clear 08/22/2021 1433   LABSPEC 1.014 06/19/2019 0827   PHURINE 5.0 06/19/2019 0827   GLUCOSEU 3+ (A) 08/22/2021 1433   HGBUR LARGE (A) 06/19/2019 0827   BILIRUBINUR Negative 08/22/2021 1433   KETONESUR NEGATIVE 06/19/2019 0827   PROTEINUR Negative 08/22/2021 1433   PROTEINUR 100 (A) 06/19/2019 0827   UROBILINOGEN 0.2 11/04/2019 1451   UROBILINOGEN 0.2 05/15/2014 0122   NITRITE Negative 08/22/2021 1433   NITRITE NEGATIVE 06/19/2019 0827   LEUKOCYTESUR Negative 08/22/2021 1433   LEUKOCYTESUR LARGE (A) 06/19/2019 0827    Lab Results  Component Value Date   LABMICR See below: 08/22/2021   WBCUA None seen 08/22/2021   LABEPIT None seen 08/22/2021   MUCUS Present 08/22/2021   BACTERIA Few 08/22/2021    Pertinent Imaging:  No results found for this or any previous visit.  No results found for this or any previous visit.  No results found for this or any previous visit.  No results found for this or any previous visit.  No results found for this or any previous  visit.  No results found for this or any previous visit.  No results found for this or any previous visit.  Results for orders placed during the hospital encounter of 09/20/17  CT Renal Stone Study  Narrative CLINICAL DATA:  No new urinary tract stone. Low back pain for 2 weeks. Urinary retention and dysuria.  EXAM: CT ABDOMEN AND PELVIS WITHOUT CONTRAST  TECHNIQUE: Multidetector CT imaging of the abdomen and pelvis was performed following the standard protocol without IV contrast.  COMPARISON:  CT of the abdomen and pelvis 01/25/2012. Lumbar spine radiographs 04/24/2017.  FINDINGS: Lower chest: Excellent recall bronchiectasis is again noted at the lung bases bilaterally without superimposed airspace disease. The heart size is normal. No significant pleural or pericardial effusion is present.  Hepatobiliary: No focal liver abnormality is seen. No gallstones, gallbladder wall thickening, or biliary dilatation.  Pancreas: Unremarkable. No pancreatic ductal dilatation or surrounding inflammatory changes.  Spleen: Calcification along the lateral margin of the spleen is stable. No focal lesions are evident.  Adrenals/Urinary Tract: Adrenal glands are normal bilaterally. No stone or mass lesion is present. There is no obstruction. The ureters are within normal limits bilaterally. The urinary bladder is within normal limits.  Stomach/Bowel: The stomach and duodenum are within normal limits. Small bowel is unremarkable. The terminal ileum is within normal limits. The appendix is visualized and normal.  Vascular/Lymphatic: Atherosclerotic calcifications are present without aneurysm.  Reproductive: A penile prosthesis is noted. The prostate gland is somewhat prominent and indents the inferior margin of the urinary bladder. It measures 4 cm in transverse diameter.  Other: No abdominal wall hernia or abnormality. No abdominopelvic ascites.  Musculoskeletal: Progressive  endplate degenerative changes and sclerosis are present at L3-4, L4-5, and L5-S1. A transitional S1 segment is again noted. Foraminal narrowing is evident bilaterally at L4-5 and L5-S1.  IMPRESSION: 1. No urinary tract obstruction or stone. 2. Prominent prostate gland indents the inferior border of the urinary bladder. 3. progressive degenerative changes in the lower lumbar spine. 4. Penile prosthesis.   Electronically Signed By: Marin Roberts [name] On: 09/20/2017 16:28   Assessment & Plan:    1. Malfunction of penile prosthesis, initial encounter (HCC) -IPP cycled today and cycled without difficulty. Patient was instructed on cycling the new device and did well with cycling the device.  - Urinalysis, Routine w reflex microscopic   No follow-ups on file.  Wilkie Aye, MD  [facility]

## 2021-10-16 ENCOUNTER — Encounter (INDEPENDENT_AMBULATORY_CARE_PROVIDER_SITE_OTHER): Payer: Self-pay | Admitting: Gastroenterology

## 2021-10-16 ENCOUNTER — Other Ambulatory Visit (INDEPENDENT_AMBULATORY_CARE_PROVIDER_SITE_OTHER): Payer: Self-pay

## 2021-10-16 ENCOUNTER — Ambulatory Visit (INDEPENDENT_AMBULATORY_CARE_PROVIDER_SITE_OTHER): Payer: Medicare HMO | Admitting: Gastroenterology

## 2021-10-16 VITALS — BP 129/65 | HR 68 | Temp 98.0°F | Ht 70.0 in | Wt 143.1 lb

## 2021-10-16 DIAGNOSIS — Z01812 Encounter for preprocedural laboratory examination: Secondary | ICD-10-CM

## 2021-10-16 DIAGNOSIS — R634 Abnormal weight loss: Secondary | ICD-10-CM | POA: Diagnosis not present

## 2021-10-16 DIAGNOSIS — K5903 Drug induced constipation: Secondary | ICD-10-CM | POA: Diagnosis not present

## 2021-10-16 NOTE — Progress Notes (Signed)
[name], [name] ?Gastroenterology & Hepatology ?[facility] ?[address] ?[place], [place] [address] ? ?Primary Care Physician: ?Carrolyn Meiers, MD ?[address] ?[place] [address] ? ?I will communicate my assessment and recommendations to the referring MD via EMR. ? ?Problems: ?Opiate induced Constipation ?Esophageal dysmotility ? ?History of Present Illness: ?[name] is a 81 y.o. male with past medical history of esophageal dysmotility, diabetes, GERD complicated by Schatzki's ring, hypertension, opiate induced constipation, esophageal dysmotility, hyperlipidemia and OSA,   who presents for follow up of constipation and weight loss. ? ?The patient was last seen on 04/17/2021. At that time, the patient was advised to increase miralax to 3 capfuls every day.  he was also advised to start taking omeprazole 40 mg every day for heartburn. ? ?Patient reports that he is concerned as for the last couple years he has presented weight loss , believes 22 lb. Has lost 12 lb since his last appointment.  States that he is unsure why he has been losing weight but reported that his appetite has been increased. He reports that he has presented burning sensation in his abdomen diffusely for the last 3-4 weeks which he did not have in the past and it is intermittent in nature. ? ?He was taking Miralax 2 times a day but could not afford it as it was expensive. Recently got a card to get it for a lower price that he will start taking consistently. ? ?The patient denies having any nausea, vomiting, fever, chills, hematochezia, melena, hematemesis, abdominal distention,  diarrhea, jaundice, pruritus. ? ?Takes oxycodone 3 times a day on average ? ?Last EGD: 02/10/2020, esophagus was normal, and empiric dilation via [facility] dilator was performed up to 72 [place], there was no presence of mucosal disruption.  Stomach and small bowel were normal. ?   ?Last Colonoscopy: 2020 - - The entire examined colon is normal. ?- Internal hemorrhoids. ?- No specimens collected. ? ?Past Medical History: ?Past Medical History:  ?Diagnosis Date  ? Arthritis   ? fingers, back  ? Chronic back pain   ? Diabetes mellitus Since 1995  ? Takes Glucovance and Onglyza daily  ? Diabetes mellitus without complication ([place])   ? GERD (gastroesophageal reflux disease)   ? takes Dexilant and Omeprazole daily  ? Herpes   ? History of colon polyps   ? History of gastric ulcer 25+yrs ago  ? Hyperlipidemia Since 1995  ? takes Simvastatin daily  ? Hypertension Since 1995  ? takes Metoprolol and Ramipril daily  ? Hypertension   ? Pneumonia   ? hx of;as a child  ? Sleep apnea   ? doesn't use CPAP  ? Urinary frequency   ? Urinary urgency   ? takes Flomax daily  ? ? ?Past Surgical History: ?Past Surgical History:  ?Procedure Laterality Date  ? [place]  ? Lumbar  ? BACK SURGERY    ? bilateral cataract surgery    ? CARDIAC CATHETERIZATION  02/16/10  ? CERVICAL SPINE SURGERY    ? COLONOSCOPY    ? COLONOSCOPY N/A 04/23/2013  ? Procedure: COLONOSCOPY;  Surgeon: Rogene Houston, MD;  [facility];  Service: Endoscopy;  Laterality: N/A;  1030  ? COLONOSCOPY N/A 07/24/2018  ? Procedure: COLONOSCOPY;  Surgeon: Rogene Houston, MD;  [facility];  Service: Endoscopy;  Laterality: N/A;  8:30  ? ESOPHAGEAL DILATION N/A 02/10/2020  ? Procedure: ESOPHAGEAL DILATION;  Surgeon:  Rogene Houston, MD;  [facility];  Service: Endoscopy;  Laterality: N/A;  ? ESOPHAGOGASTRODUODENOSCOPY N/A 12/24/2014  ? Procedure: ESOPHAGOGASTRODUODENOSCOPY (EGD);  Surgeon: Rogene Houston, MD;  [facility];  Service: Endoscopy;  Laterality: N/A;  2:45  ? ESOPHAGOGASTRODUODENOSCOPY (EGD) WITH ESOPHAGEAL DILATION    ? ESOPHAGOGASTRODUODENOSCOPY (EGD) WITH PROPOFOL N/A 02/10/2020  ? Procedure: ESOPHAGOGASTRODUODENOSCOPY (EGD) WITH PROPOFOL;  Surgeon: Rogene Houston, MD;  [facility];  Service: Endoscopy;  Laterality: N/A;  250  ? HAND SURGERY    ? Right  ? LUMBAR LAMINECTOMY/DECOMPRESSION MICRODISCECTOMY N/A 08/29/2012  ? Procedure: LUMBAR LAMINECTOMY/DECOMPRESSION MICRODISCECTOMY 1 LEVEL;  Surgeon: Floyce Stakes, MD;  Location: [facility];  Service: Neurosurgery;  Laterality: N/A;  Lumbar four-five laminectomies  ? NECK SURGERY    ? PENILE PROSTHESIS IMPLANT    ? PENILE PROSTHESIS IMPLANT N/A 08/07/2021  ? Procedure: PENILE PROTHESIS INFLATABLE;  Surgeon: Cleon Gustin, MD;  [facility]: [facility];  Service: Urology;  Laterality: N/A;  ? REMOVAL OF PENILE PROSTHESIS N/A 08/07/2021  ? Procedure: REMOVAL OF PENILE PROSTHESIS;  Surgeon: Cleon Gustin, MD;  [facility];  Service: Urology;  Laterality: N/A;  ? ROTATOR CUFF REPAIR    ? Right  ? SHOULDER SURGERY    ? TRANSURETHRAL RESECTION OF PROSTATE N/A 05/25/2019  ? Procedure: TRANSURETHRAL RESECTION OF THE PROSTATE (TURP);  Surgeon: Cleon Gustin, MD;  [facility]: [facility];  Service: Urology;  Laterality: N/A;  ? WRIST SURGERY    ? Left  ? WRIST SURGERY    ? ? ?Family History: ?Family History  ?Problem Relation Age of Onset  ? Heart attack Father 67  ?     MI  ? Hypertension Father   ? Heart attack Brother 56  ?     MI  ? Diabetes Mother   ? Heart attack Paternal Uncle   ? ? ?Social History: ?Social History  ? ?Tobacco Use  ?Smoking Status Never  ?Smokeless Tobacco Never  ? ?Social History  ? ?Substance and Sexual Activity  ?Alcohol Use Not Currently  ? Comment: occasionally   ? ?Social History  ? ?Substance and Sexual Activity  ?Drug Use Yes  ? Frequency: 3.0 times per week  ? Types: Marijuana  ? ? ?Allergies: ?Allergies  ?Allergen Reactions  ? Aspirin Other (See Comments)  ?  Higher dosage upsets stomach  ? Bayer Aspirin [Aspirin] Other (See Comments)  ?  Stomach "flares up"  ? ? ?Medications: ?Current Outpatient Medications  ?Medication Sig Dispense Refill  ? acyclovir (ZOVIRAX) 200 MG capsule Take 200 mg by mouth as  needed.    ? amLODipine (NORVASC) 10 MG tablet Take 10 mg by mouth daily.    ? Blood Glucose Monitoring Suppl (ONETOUCH VERIO FLEX SYSTEM) w/Device KIT USE TO TEST TWICE DAILY    ? cetirizine (ZYRTEC) 10 MG tablet Take 10 mg by mouth daily.    ? gabapentin (NEURONTIN) 100 MG capsule Take 1 capsule (100 mg total) by mouth 3 (three) times daily. 90 capsule 0  ? glyBURIDE-metformin (GLUCOVANCE) 5-500 MG per tablet Take 2 tablets by mouth 2 (two) times daily with a meal.     ? hydrochlorothiazide (HYDRODIURIL) 12.5 MG tablet Take 12.5 mg by mouth daily.    ? JARDIANCE 25 MG TABS tablet Take 25 mg by mouth daily.    ? labetalol (NORMODYNE) 200 MG tablet Take 200 mg by mouth daily at 6 (six) AM.    ? Lancets (  ONETOUCH DELICA PLUS [id number]) MISC USE TO TEST TWICE DAILY    ? metFORMIN (GLUCOPHAGE) 1000 MG tablet Take 1,000 mg by mouth 2 (two) times daily.    ? metoprolol succinate (TOPROL-XL) 50 MG 24 hr tablet Take 50 mg by mouth daily. Take with or immediately following a meal.    ? naproxen (NAPROSYN) 500 MG tablet Take 500 mg by mouth 2 (two) times daily with a meal.    ? omeprazole (PRILOSEC) 40 MG capsule Take 1 capsule (40 mg total) by mouth daily. 90 capsule 3  ? ONETOUCH VERIO test strip USE TO TEST TWICE DAILY    ? oxyCODONE-acetaminophen (PERCOCET) 5-325 MG tablet Take 1 tablet by mouth every 4 (four) hours as needed for severe pain. 30 tablet 0  ? pantoprazole (PROTONIX) 40 MG tablet Take 40 mg by mouth daily.    ? ramipril (ALTACE) 10 MG capsule Take 10 mg by mouth daily.     ? simvastatin (ZOCOR) 20 MG tablet Take 20 mg by mouth daily.    ? tamsulosin (FLOMAX) 0.4 MG CAPS capsule Take 0.4 mg by mouth daily.    ? labetalol (NORMODYNE) 300 MG tablet Take 300 mg by mouth 3 (three) times daily.    ? polyethylene glycol powder (GLYCOLAX) 17 GM/SCOOP powder Take 17 g by mouth daily. (Patient not taking: Reported on 10/16/2021) 850 g 3  ? ?No current facility-administered medications for this visit.  ? ? ?Review of  Systems: ?GENERAL: negative for malaise, night sweats ?HEENT: No changes in hearing or vision, no nose bleeds or other nasal problems. ?NECK: Negative for lumps, goiter, pain and significant neck swelling ?R

## 2021-10-16 NOTE — Patient Instructions (Signed)
Schedule CT abdomen/pelvis with IV contrast ?Increase Miralax to 3 capfuls per day ? ?

## 2021-11-03 ENCOUNTER — Other Ambulatory Visit (HOSPITAL_COMMUNITY): Payer: Self-pay | Admitting: Neurology

## 2021-11-03 ENCOUNTER — Other Ambulatory Visit: Payer: Self-pay | Admitting: Neurology

## 2021-11-03 DIAGNOSIS — M5412 Radiculopathy, cervical region: Secondary | ICD-10-CM

## 2021-11-21 ENCOUNTER — Ambulatory Visit (HOSPITAL_COMMUNITY)
Admission: RE | Admit: 2021-11-21 | Discharge: 2021-11-21 | Disposition: A | Discharge status: Home / Self Care | Payer: Medicare HMO | Source: Ambulatory Visit | Attending: Gastroenterology | Admitting: Gastroenterology

## 2021-11-21 DIAGNOSIS — R634 Abnormal weight loss: Secondary | ICD-10-CM | POA: Insufficient documentation

## 2021-11-21 DIAGNOSIS — K5903 Drug induced constipation: Secondary | ICD-10-CM | POA: Insufficient documentation

## 2021-11-21 LAB — POCT I-STAT CREATININE: Creatinine, Ser: 1.3 mg/dL — ABNORMAL HIGH (ref 0.61–1.24)

## 2021-11-21 MED ORDER — IOHEXOL 300 MG/ML  SOLN
80.0000 mL | Freq: Once | INTRAMUSCULAR | Status: AC | PRN
  Administered 2021-11-21: 80 mL via INTRAVENOUS

## 2021-11-23 ENCOUNTER — Encounter (INDEPENDENT_AMBULATORY_CARE_PROVIDER_SITE_OTHER): Payer: Self-pay

## 2021-11-23 ENCOUNTER — Telehealth (INDEPENDENT_AMBULATORY_CARE_PROVIDER_SITE_OTHER): Payer: Self-pay

## 2021-11-23 ENCOUNTER — Other Ambulatory Visit (INDEPENDENT_AMBULATORY_CARE_PROVIDER_SITE_OTHER): Payer: Self-pay

## 2021-11-23 DIAGNOSIS — R933 Abnormal findings on diagnostic imaging of other parts of digestive tract: Secondary | ICD-10-CM

## 2021-11-23 MED ORDER — PEG 3350-KCL-NA BICARB-NACL 420 G PO SOLR: 4000.0000 mL | ORAL | 0 refills | Status: DC

## 2021-11-23 NOTE — Telephone Encounter (Signed)
[name], CMA  ?

## 2021-11-24 NOTE — Patient Instructions (Addendum)
[name]  11/24/2021     '@PREFPERIOPPHARMACY'$ @   Your procedure is scheduled on  11/28/2021.   Report to [facility] at  1245 P.M.   Call this number if you have problems the morning of surgery:  [phone]   Remember:  Follow the diet and prep instructions given to you by the office.     DO NOT take any medications for diabetes the morning of your procedure.     Take these medicines the morning of surgery with A SIP OF WATER           amlodipine, zyrtec, gabapentin, labetolol, metoprolol, prilosec, oxycodone (if needed), protonix.     Do not wear jewelry, make-up or nail polish.  Do not wear lotions, powders, or perfumes, or deodorant.  Do not shave 48 hours prior to surgery.  Men may shave face and neck.  Do not bring valuables to the hospital.  [facility] is not responsible for any belongings or valuables.  Contacts, dentures or bridgework may not be worn into surgery.  Leave your suitcase in the car.  After surgery it may be brought to your room.  For patients admitted to the hospital, discharge time will be determined by your treatment team.  Patients discharged the day of surgery will not be allowed to drive home and must have someone with them for 24 hours.    Special instructions:   DO NOT smoke tobacco or vape for 24 hours before your procedure.  Please read over the following fact sheets that you were given. Anesthesia Post-op Instructions and Care and Recovery After Surgery       Colonoscopy, Adult, Care After The following information offers guidance on how to care for yourself after your procedure. Your health care provider may also give you more specific instructions. If you have problems or questions, contact your health care provider. What can I expect after the procedure? After the procedure, it is common to have: A small amount of blood in your stool for 24 hours after the procedure. Some gas. Mild cramping or bloating of your  abdomen. Follow these instructions at home: Eating and drinking  Drink enough fluid to keep your urine pale yellow. Follow instructions from your health care provider about eating or drinking restrictions. Resume your normal diet as told by your health care provider. Avoid heavy or fried foods that are hard to digest. Activity Rest as told by your health care provider. Avoid sitting for a long time without moving. Get up to take short walks every 1-2 hours. This is important to improve blood flow and breathing. Ask for help if you feel weak or unsteady. Return to your normal activities as told by your health care provider. Ask your health care provider what activities are safe for you. Managing cramping and bloating  Try walking around when you have cramps or feel bloated. If directed, apply heat to your abdomen as told by your health care provider. Use the heat source that your health care provider recommends, such as a moist heat pack or a heating pad. Place a towel between your skin and the heat source. Leave the heat on for 20-30 minutes. Remove the heat if your skin turns bright red. This is especially important if you are unable to feel pain, heat, or cold. You have a greater risk of getting burned. General instructions If you were given a sedative during the procedure, it can affect you for several hours.  Do not drive or operate machinery until your health care provider says that it is safe. For the first 24 hours after the procedure: Do not sign important documents. Do not drink alcohol. Do your regular daily activities at a slower pace than normal. Eat soft foods that are easy to digest. Take over-the-counter and prescription medicines only as told by your health care provider. Keep all follow-up visits. This is important. Contact a health care provider if: You have blood in your stool 2-3 days after the procedure. Get help right away if: You have more than a small spotting of  blood in your stool. You have large blood clots in your stool. You have swelling of your abdomen. You have nausea or vomiting. You have a fever. You have increasing pain in your abdomen that is not relieved with medicine. These symptoms may be an emergency. Get help right away. Call 911. Do not wait to see if the symptoms will go away. Do not drive yourself to the hospital. Summary After the procedure, it is common to have a small amount of blood in your stool. You may also have mild cramping and bloating of your abdomen. If you were given a sedative during the procedure, it can affect you for several hours. Do not drive or operate machinery until your health care provider says that it is safe. Get help right away if you have a lot of blood in your stool, nausea or vomiting, a fever, or increased pain in your abdomen. This information is not intended to replace advice given to you by your health care provider. Make sure you discuss any questions you have with your health care provider. Document Revised: 02/15/2021 Document Reviewed: 02/15/2021 Elsevier Patient Education  [place] After This sheet gives you information about how to care for yourself after your procedure. Your health care provider may also give you more specific instructions. If you have problems or questions, contact your health care provider. What can I expect after the procedure? After the procedure, it is common to have: Tiredness. Forgetfulness about what happened after the procedure. Impaired judgment for important decisions. Nausea or vomiting. Some difficulty with balance. Follow these instructions at home: For the time period you were told by your health care provider:     Rest as needed. Do not participate in activities where you could fall or become injured. Do not drive or use machinery. Do not drink alcohol. Do not take sleeping pills or medicines that cause  drowsiness. Do not make important decisions or sign legal documents. Do not take care of children on your own. Eating and drinking Follow the diet that is recommended by your health care provider. Drink enough fluid to keep your urine pale yellow. If you vomit: Drink water, juice, or soup when you can drink without vomiting. Make sure you have little or no nausea before eating solid foods. General instructions Have a responsible adult stay with you for the time you are told. It is important to have someone help care for you until you are awake and alert. Take over-the-counter and prescription medicines only as told by your health care provider. If you have sleep apnea, surgery and certain medicines can increase your risk for breathing problems. Follow instructions from your health care provider about wearing your sleep device: Anytime you are sleeping, including during daytime naps. While taking prescription pain medicines, sleeping medicines, or medicines that make you drowsy. Avoid smoking. Keep all follow-up visits as  told by your health care provider. This is important. Contact a health care provider if: You keep feeling nauseous or you keep vomiting. You feel light-headed. You are still sleepy or having trouble with balance after 24 hours. You develop a rash. You have a fever. You have redness or swelling around the IV site. Get help right away if: You have trouble breathing. You have new-onset confusion at home. Summary For several hours after your procedure, you may feel tired. You may also be forgetful and have poor judgment. Have a responsible adult stay with you for the time you are told. It is important to have someone help care for you until you are awake and alert. Rest as told. Do not drive or operate machinery. Do not drink alcohol or take sleeping pills. Get help right away if you have trouble breathing, or if you suddenly become confused. This information is not  intended to replace advice given to you by your health care provider. Make sure you discuss any questions you have with your health care provider. Document Revised: 05/30/2021 Document Reviewed: 05/28/2019 Elsevier Patient Education  [place].

## 2021-11-27 ENCOUNTER — Encounter (HOSPITAL_COMMUNITY): Payer: Self-pay

## 2021-11-27 ENCOUNTER — Ambulatory Visit (HOSPITAL_COMMUNITY)
Admission: RE | Admit: 2021-11-27 | Discharge: 2021-11-27 | Disposition: A | Discharge status: Home / Self Care | Payer: Medicare HMO | Source: Ambulatory Visit | Attending: Gastroenterology | Admitting: Gastroenterology

## 2021-11-27 DIAGNOSIS — Z01812 Encounter for preprocedural laboratory examination: Secondary | ICD-10-CM | POA: Insufficient documentation

## 2021-11-27 DIAGNOSIS — R933 Abnormal findings on diagnostic imaging of other parts of digestive tract: Secondary | ICD-10-CM | POA: Diagnosis not present

## 2021-11-27 LAB — BASIC METABOLIC PANEL
Anion gap: 5 (ref 5–15)
BUN: 21 mg/dL (ref 8–23)
CO2: 28 mmol/L (ref 22–32)
Calcium: 9.4 mg/dL (ref 8.9–10.3)
Chloride: 104 mmol/L (ref 98–111)
Creatinine, Ser: 1.04 mg/dL (ref 0.61–1.24)
GFR, Estimated: >60 mL/min (ref >60)
Glucose, Bld: 142 mg/dL — ABNORMAL HIGH (ref 70–99)
Potassium: 3.8 mmol/L (ref 3.5–5.1)
Sodium: 137 mmol/L (ref 135–145)

## 2021-11-28 ENCOUNTER — Ambulatory Visit (HOSPITAL_COMMUNITY)
Admission: RE | Admit: 2021-11-28 | Discharge: 2021-11-28 | Disposition: A | Discharge status: Home / Self Care | Payer: Medicare HMO | Attending: Gastroenterology | Admitting: Gastroenterology

## 2021-11-28 ENCOUNTER — Encounter (HOSPITAL_COMMUNITY)
Admission: RE | Disposition: A | Discharge status: Home / Self Care | Payer: Self-pay | Source: Home / Self Care | Attending: Gastroenterology

## 2021-11-28 ENCOUNTER — Other Ambulatory Visit: Payer: Self-pay

## 2021-11-28 ENCOUNTER — Encounter (HOSPITAL_COMMUNITY): Payer: Self-pay | Admitting: Gastroenterology

## 2021-11-28 ENCOUNTER — Ambulatory Visit (HOSPITAL_COMMUNITY): Payer: Medicare HMO | Admitting: Anesthesiology

## 2021-11-28 ENCOUNTER — Ambulatory Visit (HOSPITAL_BASED_OUTPATIENT_CLINIC_OR_DEPARTMENT_OTHER): Payer: Medicare HMO | Admitting: Anesthesiology

## 2021-11-28 DIAGNOSIS — I10 Essential (primary) hypertension: Secondary | ICD-10-CM

## 2021-11-28 DIAGNOSIS — Z79899 Other long term (current) drug therapy: Secondary | ICD-10-CM | POA: Diagnosis not present

## 2021-11-28 DIAGNOSIS — K219 Gastro-esophageal reflux disease without esophagitis: Secondary | ICD-10-CM | POA: Diagnosis not present

## 2021-11-28 DIAGNOSIS — N400 Enlarged prostate without lower urinary tract symptoms: Secondary | ICD-10-CM

## 2021-11-28 DIAGNOSIS — E119 Type 2 diabetes mellitus without complications: Secondary | ICD-10-CM | POA: Insufficient documentation

## 2021-11-28 DIAGNOSIS — K635 Polyp of colon: Secondary | ICD-10-CM | POA: Diagnosis not present

## 2021-11-28 DIAGNOSIS — E785 Hyperlipidemia, unspecified: Secondary | ICD-10-CM | POA: Insufficient documentation

## 2021-11-28 DIAGNOSIS — R072 Precordial pain: Secondary | ICD-10-CM

## 2021-11-28 DIAGNOSIS — D122 Benign neoplasm of ascending colon: Secondary | ICD-10-CM | POA: Insufficient documentation

## 2021-11-28 DIAGNOSIS — K5900 Constipation, unspecified: Secondary | ICD-10-CM

## 2021-11-28 DIAGNOSIS — Z7984 Long term (current) use of oral hypoglycemic drugs: Secondary | ICD-10-CM | POA: Diagnosis not present

## 2021-11-28 DIAGNOSIS — R933 Abnormal findings on diagnostic imaging of other parts of digestive tract: Secondary | ICD-10-CM | POA: Insufficient documentation

## 2021-11-28 DIAGNOSIS — N529 Male erectile dysfunction, unspecified: Secondary | ICD-10-CM

## 2021-11-28 DIAGNOSIS — J40 Bronchitis, not specified as acute or chronic: Secondary | ICD-10-CM

## 2021-11-28 DIAGNOSIS — R339 Retention of urine, unspecified: Secondary | ICD-10-CM

## 2021-11-28 DIAGNOSIS — Z6820 Body mass index (BMI) 20.0-20.9, adult: Secondary | ICD-10-CM | POA: Insufficient documentation

## 2021-11-28 DIAGNOSIS — K648 Other hemorrhoids: Secondary | ICD-10-CM

## 2021-11-28 DIAGNOSIS — R3912 Poor urinary stream: Secondary | ICD-10-CM

## 2021-11-28 DIAGNOSIS — R634 Abnormal weight loss: Secondary | ICD-10-CM | POA: Diagnosis not present

## 2021-11-28 DIAGNOSIS — Z86010 Personal history of colonic polyps: Secondary | ICD-10-CM

## 2021-11-28 DIAGNOSIS — T40605A Adverse effect of unspecified narcotics, initial encounter: Secondary | ICD-10-CM | POA: Diagnosis not present

## 2021-11-28 DIAGNOSIS — K5903 Drug induced constipation: Secondary | ICD-10-CM | POA: Diagnosis not present

## 2021-11-28 DIAGNOSIS — N35011 Post-traumatic bulbous urethral stricture: Secondary | ICD-10-CM

## 2021-11-28 DIAGNOSIS — K838 Other specified diseases of biliary tract: Secondary | ICD-10-CM

## 2021-11-28 DIAGNOSIS — G4733 Obstructive sleep apnea (adult) (pediatric): Secondary | ICD-10-CM | POA: Insufficient documentation

## 2021-11-28 DIAGNOSIS — E7800 Pure hypercholesterolemia, unspecified: Secondary | ICD-10-CM

## 2021-11-28 HISTORY — PX: POLYPECTOMY: SHX5525

## 2021-11-28 HISTORY — PX: COLONOSCOPY WITH PROPOFOL: SHX5780

## 2021-11-28 LAB — GLUCOSE, CAPILLARY: Glucose-Capillary: 98 mg/dL (ref 70–99)

## 2021-11-28 LAB — HM COLONOSCOPY

## 2021-11-28 SURGERY — COLONOSCOPY WITH PROPOFOL
Anesthesia: General

## 2021-11-28 MED ORDER — LIDOCAINE HCL 1 % IJ SOLN
INTRAMUSCULAR | Status: DC | PRN
  Administered 2021-11-28: 50 mg via INTRADERMAL

## 2021-11-28 MED ORDER — PROPOFOL 10 MG/ML IV BOLUS
INTRAVENOUS | Status: DC | PRN
Start: 2021-11-28 — End: 2021-11-28
  Administered 2021-11-28: 50 mg via INTRAVENOUS
  Administered 2021-11-28: 5 mg via INTRAVENOUS

## 2021-11-28 MED ORDER — PROPOFOL 500 MG/50ML IV EMUL
INTRAVENOUS | Status: DC | PRN
  Administered 2021-11-28: 150 ug/kg/min via INTRAVENOUS

## 2021-11-28 MED ORDER — LACTATED RINGERS IV SOLN: INTRAVENOUS | Status: DC

## 2021-11-28 NOTE — Anesthesia Postprocedure Evaluation (Signed)
Anesthesia Post Note  Patient: [name]  Procedure(s) Performed: COLONOSCOPY WITH PROPOFOL POLYPECTOMY  Patient location during evaluation: Short Stay Anesthesia Type: General Level of consciousness: awake and alert Pain management: pain level controlled Vital Signs Assessment: post-procedure vital signs reviewed and stable Respiratory status: spontaneous breathing Cardiovascular status: blood pressure returned to baseline and stable Postop Assessment: patient able to bend at knees Anesthetic complications: no   No notable events documented.   Last Vitals:  Vitals:   11/28/21 1212 11/28/21 1324  BP: (!) 160/84 120/69  Pulse: 69 (!) 57  Resp: 18 17  Temp: 36.9 C 36.7 C  SpO2: 100% 95%    Last Pain:  Vitals:   11/28/21 1324  TempSrc: Oral  PainSc: 0-No pain                 Eldon Zietlow

## 2021-11-28 NOTE — H&P (Signed)
[name] is an 81 y.o. male.   Chief Complaint: abnormal CT scan, conern for malignancy HPI: [name] is a 81 y.o. 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  Patient has presented significant weight loss close to 22 pounds for the last 3 years.  He has also presented significant constipation.  Due to the symptoms he underwent a CT of the abdomen and pelvis with IV contrast which showed presence of circumferential wall thickening in the cecum and proximal ascending colon concerning for malignancy.  Past Medical History:  Diagnosis Date   Arthritis    fingers, back   Chronic back pain    Diabetes mellitus Since 1995   Takes Glucovance and Onglyza daily   Diabetes mellitus without complication ([place])    GERD (gastroesophageal reflux disease)    takes Dexilant and Omeprazole daily   Herpes    History of colon polyps    History of gastric ulcer 25+yrs ago   Hyperlipidemia Since 1995   takes Simvastatin daily   Hypertension Since 1995   takes Metoprolol and Ramipril daily   Hypertension    Pneumonia    hx of;as a child   Sleep apnea    doesn't use CPAP   Urinary frequency    Urinary urgency    takes Flomax daily    Past Surgical History:  Procedure Laterality Date   BACK SURGERY  1962   Lumbar   BACK SURGERY     bilateral cataract surgery     CARDIAC CATHETERIZATION  02/16/10   CERVICAL SPINE SURGERY     COLONOSCOPY     COLONOSCOPY N/A 04/23/2013   Procedure: COLONOSCOPY;  Surgeon: Rogene Houston, MD;  [facility]: [facility];  Service: Endoscopy;  Laterality: N/A;  1030   COLONOSCOPY N/A 07/24/2018   Procedure: COLONOSCOPY;  Surgeon: Rogene Houston, MD;  Location: [facility];  Service: Endoscopy;  Laterality: N/A;  8:30    ESOPHAGEAL DILATION N/A 02/10/2020   Procedure: ESOPHAGEAL DILATION;  Surgeon: Rogene Houston, MD;  Location: [facility];  Service: Endoscopy;  Laterality: N/A;   ESOPHAGOGASTRODUODENOSCOPY N/A 12/24/2014   Procedure: ESOPHAGOGASTRODUODENOSCOPY (EGD);  Surgeon: Rogene Houston, MD;  [facility];  Service: Endoscopy;  Laterality: N/A;  2:45   ESOPHAGOGASTRODUODENOSCOPY (EGD) WITH ESOPHAGEAL DILATION     ESOPHAGOGASTRODUODENOSCOPY (EGD) WITH PROPOFOL N/A 02/10/2020   Procedure: ESOPHAGOGASTRODUODENOSCOPY (EGD) WITH PROPOFOL;  Surgeon: Rogene Houston, MD;  [facility];  Service: Endoscopy;  Laterality: N/A;  250   HAND SURGERY     Right   LUMBAR LAMINECTOMY/DECOMPRESSION MICRODISCECTOMY N/A 08/29/2012   Procedure: LUMBAR LAMINECTOMY/DECOMPRESSION MICRODISCECTOMY 1 LEVEL;  Surgeon: Floyce Stakes, MD;  Location: [place] [facility];  Service: Neurosurgery;  Laterality: N/A;  Lumbar four-five laminectomies   NECK SURGERY     PENILE PROSTHESIS IMPLANT     PENILE PROSTHESIS IMPLANT N/A 08/07/2021   Procedure: PENILE PROTHESIS INFLATABLE;  Surgeon: Cleon Gustin, MD;  Location: [facility];  Service: Urology;  Laterality: N/A;   REMOVAL OF PENILE PROSTHESIS N/A 08/07/2021   Procedure: REMOVAL OF PENILE PROSTHESIS;  Surgeon: Cleon Gustin, MD;  Location: [facility];  Service: Urology;  Laterality: N/A;   ROTATOR CUFF REPAIR     Right   SHOULDER SURGERY     TRANSURETHRAL  RESECTION OF PROSTATE N/A 05/25/2019   Procedure: TRANSURETHRAL RESECTION OF THE PROSTATE (TURP);  Surgeon: Cleon Gustin, MD;  Location: [facility];  Service: Urology;  Laterality: N/A;   WRIST SURGERY     Left   WRIST SURGERY      Family History  Problem Relation Age of Onset   Heart attack Father 70       MI   Hypertension Father    Heart attack Brother 15       MI   Diabetes Mother    Heart attack Paternal Uncle    Social History:  reports that he has never smoked. He has never used smokeless  tobacco. He reports that he does not currently use alcohol. He reports current drug use. Frequency: 3.00 times per week. Drug: Marijuana.  Allergies:  Allergies  Allergen Reactions   Aspirin Other (See Comments)    Higher dosage upsets stomach   Bayer Aspirin [Aspirin] Other (See Comments)    Stomach "flares up"    Medications Prior to Admission  Medication Sig Dispense Refill   amLODipine (NORVASC) 10 MG tablet Take 10 mg by mouth daily.     Blood Glucose Monitoring Suppl (ONETOUCH VERIO FLEX SYSTEM) w/Device KIT USE TO TEST TWICE DAILY     buprenorphine (BUTRANS) 15 MCG/HR Place onto the skin once a week.     cetirizine (ZYRTEC) 10 MG tablet Take 10 mg by mouth daily.     gabapentin (NEURONTIN) 100 MG capsule Take 1 capsule (100 mg total) by mouth 3 (three) times daily. 90 capsule 0   glyBURIDE-metformin (GLUCOVANCE) 5-500 MG per tablet Take 2 tablets by mouth 2 (two) times daily with a meal.      hydrochlorothiazide (HYDRODIURIL) 12.5 MG tablet Take 12.5 mg by mouth daily.     JARDIANCE 25 MG TABS tablet Take 25 mg by mouth daily.     labetalol (NORMODYNE) 200 MG tablet Take 200 mg by mouth daily at 6 (six) AM.     labetalol (NORMODYNE) 300 MG tablet Take 300 mg by mouth 3 (three) times daily.     Lancets (ONETOUCH DELICA PLUS [id number]) MISC USE TO TEST TWICE DAILY     metFORMIN (GLUCOPHAGE) 1000 MG tablet Take 1,000 mg by mouth 2 (two) times daily.     metoprolol succinate (TOPROL-XL) 50 MG 24 hr tablet Take 50 mg by mouth daily. Take with or immediately following a meal.     naproxen (NAPROSYN) 500 MG tablet Take 500 mg by mouth 2 (two) times daily with a meal.     omeprazole (PRILOSEC) 40 MG capsule Take 1 capsule (40 mg total) by mouth daily. 90 capsule 3   ONETOUCH VERIO test strip USE TO TEST TWICE DAILY     oxyCODONE-acetaminophen (PERCOCET) 5-325 MG tablet Take 1 tablet by mouth every 4 (four) hours as needed for severe pain. 30 tablet 0   pantoprazole (PROTONIX) 40 MG  tablet Take 40 mg by mouth daily.     polyethylene glycol powder (GLYCOLAX) 17 GM/SCOOP powder Take 17 g by mouth daily. 850 g 3   polyethylene glycol-electrolytes (TRILYTE) 420 g solution Take 4,000 mLs by mouth as directed. 4000 mL 0   ramipril (ALTACE) 10 MG capsule Take 10 mg by mouth daily.      simvastatin (ZOCOR) 20 MG tablet Take 20 mg by mouth daily.     tamsulosin (FLOMAX) 0.4 MG CAPS capsule Take 0.4 mg by mouth daily.     acyclovir (ZOVIRAX) 200 MG  capsule Take 200 mg by mouth as needed.      Results for orders placed or performed during the hospital encounter of 11/28/21 (from the past 48 hour(s))  Glucose, capillary     Status: None   Collection Time: 11/28/21 12:09 PM  Result Value Ref Range   Glucose-Capillary 98 70 - 99 mg/dL    Comment: Glucose reference range applies only to samples taken after fasting for at least 8 hours.   No results found.  Review of Systems  Blood pressure (!) 160/84, pulse 69, temperature 98.5 F (36.9 C), temperature source Oral, resp. rate 18, height _0  (1.778 m), weight 65.3 kg, SpO2 100 %. Physical Exam  GENERAL: The patient is AO x3, in no acute distress. HEENT: Head is normocephalic and atraumatic. EOMI are intact. Mouth is well hydrated and without lesions. NECK: Supple. No masses LUNGS: Clear to auscultation. No presence of rhonchi/wheezing/rales. Adequate chest expansion HEART: RRR, normal s1 and s2. ABDOMEN: Soft, nontender, no guarding, no peritoneal signs, and nondistended. BS +. No masses. EXTREMITIES: Without any cyanosis, clubbing, rash, lesions or edema. NEUROLOGIC: AOx3, no focal motor deficit. SKIN: no jaundice, no rashes  Assessment/Plan  [name] is a 81 y.o. 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  We will proceed with colonoscopy.  Harvel Quale, MD 11/28/2021, 12:49 PM

## 2021-11-28 NOTE — Transfer of Care (Signed)
Immediate Anesthesia Transfer of Care Note  Patient: [name]  Procedure(s) Performed: COLONOSCOPY WITH PROPOFOL POLYPECTOMY  Patient Location: Short Stay  Anesthesia Type:General  Level of Consciousness: awake  Airway & Oxygen Therapy: Patient Spontanous Breathing  Post-op Assessment: Report given to RN  Post vital signs: Reviewed and stable  Last Vitals:  Vitals Value Taken Time  BP 120/69 11/28/21 1324  Temp 36.7 C 11/28/21 1324  Pulse 57 11/28/21 1324  Resp 17 11/28/21 1324  SpO2 95 % 11/28/21 1324    Last Pain:  Vitals:   11/28/21 1324  TempSrc: Oral  PainSc: 0-No pain         Complications: No notable events documented.

## 2021-11-28 NOTE — Op Note (Signed)
[facility] Patient Name: [name] Procedure Date: 11/28/2021 12:36 PM MRN: [id number] Date of Birth: 16-Jun-1941 Attending MD: [name] ,  CSN: [id number] Age: 81 Admit Type: Outpatient Procedure:                Colonoscopy Indications:              Abnormal CT of the GI tract Providers:                [name], Charlsie Quest. Theda Sers RN, RN, Raphael Gibney, Technician Referring MD:              Medicines:                Monitored Anesthesia Care Complications:            No immediate complications. Estimated Blood Loss:     Estimated blood loss: none. Procedure:                Pre-Anesthesia Assessment:                           - Prior to the procedure, a History and Physical                            was performed, and patient medications, allergies                            and sensitivities were reviewed. The patient's                            tolerance of previous anesthesia was reviewed.                           - The risks and benefits of the procedure and the                            sedation options and risks were discussed with the                            patient. All questions were answered and informed                            consent was obtained.                           - ASA Grade Assessment: III - A patient with severe                            systemic disease.                           After obtaining informed consent, the colonoscope                            was passed under direct vision. Throughout the  procedure, the patient's blood pressure, pulse, and                            oxygen saturations were monitored continuously. The                            [id number]-HQ190L ([id number]) scope was introduced through                            the anus and advanced to the the cecum, identified                            by appendiceal orifice and ileocecal valve. The                             colonoscopy was performed without difficulty. The                            patient tolerated the procedure well. The quality                            of the bowel preparation was good. Scope In: 12:55:48 PM Scope Out: 1:15:00 PM Scope Withdrawal Time: 0 hours 11 minutes 48 seconds  Total Procedure Duration: 0 hours 19 minutes 12 seconds  Findings:      The perianal and digital rectal examinations were normal.      Six sessile polyps were found in the ascending colon. The polyps were 2       to 6 mm in size. These polyps were removed with a cold snare. Resection       and retrieval were complete for 3 polyps, but the other 3 could not be       retrieved (fully resected).      The ascending colon and cecum appeared normal othwerwise. CT scan       findings likely related to stool.      Non-bleeding internal hemorrhoids were found during retroflexion. The       hemorrhoids were small. Impression:               - Six 2 to 6 mm polyps in the ascending colon,                            removed with a cold snare. Resected and retrieved.                           - Non-bleeding internal hemorrhoids. Moderate Sedation:      Per Anesthesia Care Recommendation:           - Discharge patient to home (ambulatory).                           - Resume previous diet.                           - Await pathology results.                           -  Repeat colonoscopy is not recommended due to                            current age (11 years or older) for screening                            purposes. Procedure Code(s):        --- Professional ---                           [phone], Colonoscopy, flexible; with removal of                            tumor(s), polyp(s), or other lesion(s) by snare                            technique Diagnosis Code(s):        --- Professional ---                           K63.5, Polyp of colon                           K64.8, Other hemorrhoids                            R93.3, Abnormal findings on diagnostic imaging of                            other parts of digestive tract CPT copyright 2019 American Medical Association. All rights reserved. The codes documented in this report are preliminary and upon coder review may  be revised to meet current compliance requirements. [name], MD [name],  11/28/2021 1:26:13 PM This report has been signed electronically. Number of Addenda: 0

## 2021-11-28 NOTE — Anesthesia Preprocedure Evaluation (Signed)
Anesthesia Evaluation  Patient identified by MRN, date of birth, ID band Patient awake    Reviewed: Allergy & Precautions, NPO status , Patient's Chart, lab work & pertinent test results  Airway Mallampati: II  TM Distance: >3 FB Neck ROM: Full   Comment: Neck sx Dental  (+) Caps, Missing, Dental Advisory Given   Pulmonary sleep apnea , pneumonia, resolved,    Pulmonary exam normal breath sounds clear to auscultation       Cardiovascular Exercise Tolerance: Good hypertension, Pt. on medications and Pt. on home beta blockers Normal cardiovascular exam Rhythm:Regular Rate:Normal     Neuro/Psych    GI/Hepatic GERD  Medicated and Controlled,(+)     substance abuse  marijuana use,   Endo/Other  diabetes, Well Controlled, Type 2, Oral Hypoglycemic Agents  Renal/GU negative Renal ROS     Musculoskeletal  (+) Arthritis , Osteoarthritis,    Abdominal   Peds  Hematology negative hematology ROS (+)   Anesthesia Other Findings   Reproductive/Obstetrics                             Anesthesia Physical Anesthesia Plan  ASA: 3  Anesthesia Plan: General   Post-op Pain Management: Minimal or no pain anticipated   Induction: Intravenous  PONV Risk Score and Plan: Propofol infusion  Airway Management Planned: Nasal Cannula and Natural Airway  Additional Equipment:   Intra-op Plan:   Post-operative Plan:   Informed Consent: I have reviewed the patients History and Physical, chart, labs and discussed the procedure including the risks, benefits and alternatives for the proposed anesthesia with the patient or authorized representative who has indicated his/her understanding and acceptance.     Dental advisory given  Plan Discussed with: CRNA and Surgeon  Anesthesia Plan Comments:         Anesthesia Quick Evaluation

## 2021-11-28 NOTE — Discharge Instructions (Signed)
You are being discharged to home.  Resume your previous diet.  We are waiting for your pathology results.  Your physician has indicated that a repeat colonoscopy is not recommended due to your current age (66 years or older) for screening purposes.  

## 2021-11-29 ENCOUNTER — Encounter (INDEPENDENT_AMBULATORY_CARE_PROVIDER_SITE_OTHER): Payer: Self-pay | Admitting: *Deleted

## 2021-11-29 LAB — SURGICAL PATHOLOGY

## 2021-12-05 ENCOUNTER — Encounter (HOSPITAL_COMMUNITY): Payer: Self-pay | Admitting: Gastroenterology

## 2021-12-29 ENCOUNTER — Emergency Department (HOSPITAL_COMMUNITY)
Admission: EM | Admit: 2021-12-29 | Discharge: 2021-12-29 | Disposition: A | Discharge status: Home / Self Care | Payer: Medicare HMO | Attending: Emergency Medicine | Admitting: Emergency Medicine

## 2021-12-29 ENCOUNTER — Other Ambulatory Visit: Payer: Self-pay | Admitting: Neurology

## 2021-12-29 ENCOUNTER — Other Ambulatory Visit: Payer: Self-pay

## 2021-12-29 ENCOUNTER — Encounter (HOSPITAL_COMMUNITY): Payer: Self-pay

## 2021-12-29 ENCOUNTER — Emergency Department (HOSPITAL_COMMUNITY): Payer: Medicare HMO

## 2021-12-29 ENCOUNTER — Other Ambulatory Visit (HOSPITAL_COMMUNITY): Payer: Self-pay | Admitting: Neurology

## 2021-12-29 DIAGNOSIS — G8929 Other chronic pain: Secondary | ICD-10-CM | POA: Diagnosis not present

## 2021-12-29 DIAGNOSIS — E119 Type 2 diabetes mellitus without complications: Secondary | ICD-10-CM | POA: Insufficient documentation

## 2021-12-29 DIAGNOSIS — I10 Essential (primary) hypertension: Secondary | ICD-10-CM | POA: Insufficient documentation

## 2021-12-29 DIAGNOSIS — M25511 Pain in right shoulder: Secondary | ICD-10-CM | POA: Diagnosis present

## 2021-12-29 DIAGNOSIS — M5010 Cervical disc disorder with radiculopathy, unspecified cervical region: Secondary | ICD-10-CM

## 2021-12-29 MED ORDER — ACETAMINOPHEN 500 MG PO TABS
1000.0000 mg | ORAL_TABLET | Freq: Once | ORAL | Status: AC
Start: 2021-12-29 — End: 2021-12-29
  Administered 2021-12-29: 1000 mg via ORAL
  Filled 2021-12-29: qty 2

## 2021-12-29 NOTE — ED Provider Notes (Signed)
[facility] EMERGENCY DEPARTMENT Provider Note   CSN: [id number] Arrival date & time: 12/29/21  8295     History  Chief Complaint  Patient presents with   Shoulder Pain    [name] is a 81 y.o. male who presents the emergency department complaining of right shoulder pain for 4 months.  Patient states that he had an injury to the shoulder about 12 years ago, and previously saw an orthopedist for this.  He has been having worsening pain in the past couple months, and been trying to get a orthopedic appointment.  Him and his daughter were not able to get one, and she brought him to the ER for evaluation.  He denies any recent injury to the shoulder.  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  No recent illness.   Shoulder Pain      Home Medications Prior to Admission medications   Medication Sig Start Date End Date Taking? Authorizing Provider  acyclovir (ZOVIRAX) 200 MG capsule Take 200 mg by mouth as needed. 01/07/14   [provider]  amLODipine (NORVASC) 10 MG tablet Take 10 mg by mouth daily.    [provider]  Blood Glucose Monitoring Suppl (ONETOUCH VERIO FLEX SYSTEM) w/Device KIT USE TO TEST TWICE DAILY 09/30/19   [provider]  buprenorphine (BUTRANS) 15 MCG/HR Place onto the skin once a week.    [provider]  cetirizine (ZYRTEC) 10 MG tablet Take 10 mg by mouth daily. 01/12/20   [provider]  gabapentin (NEURONTIN) 100 MG capsule Take 1 capsule (100 mg total) by mouth 3 (three) times daily. 07/07/21   Oliver Barre, MD  glyBURIDE-metformin (GLUCOVANCE) 5-500 MG per tablet Take 2 tablets by mouth 2 (two) times daily with a meal.     [provider]  hydrochlorothiazide (HYDRODIURIL) 12.5 MG tablet Take 12.5 mg by mouth daily. 07/20/21   [provider]  JARDIANCE 25 MG TABS tablet Take 25 mg by mouth daily. 08/11/21   [provider]  labetalol (NORMODYNE) 200 MG  tablet Take 200 mg by mouth daily at 6 (six) AM.    [provider]  labetalol (NORMODYNE) 300 MG tablet Take 300 mg by mouth 3 (three) times daily. 08/05/21   [provider]  Lancets (ONETOUCH DELICA PLUS [place]) MISC USE TO TEST TWICE DAILY 09/30/19   [provider]  metFORMIN (GLUCOPHAGE) 1000 MG tablet Take 1,000 mg by mouth 2 (two) times daily. 06/23/21   [provider]  metoprolol succinate (TOPROL-XL) 50 MG 24 hr tablet Take 50 mg by mouth daily. Take with or immediately following a meal.    [provider]  naproxen (NAPROSYN) 500 MG tablet Take 500 mg by mouth 2 (two) times daily with a meal.    [provider]  omeprazole (PRILOSEC) 40 MG capsule Take 1 capsule (40 mg total) by mouth daily. 04/17/21   Dolores Frame, MD  [facility] VERIO test strip USE TO TEST TWICE DAILY 09/30/19   [provider]  oxyCODONE-acetaminophen (PERCOCET) 5-325 MG tablet Take 1 tablet by mouth every 4 (four) hours as needed for severe pain. 08/22/21 08/22/22  Malen Gauze, MD  pantoprazole (PROTONIX) 40 MG tablet Take 40 mg by mouth daily. 07/10/21   [provider]  polyethylene glycol powder (GLYCOLAX) 17 GM/SCOOP powder Take 17 g by mouth daily. 04/17/21   Dolores Frame, MD  ramipril (ALTACE) 10 MG capsule Take 10 mg by  mouth daily.  02/05/14   [provider]  simvastatin (ZOCOR) 20 MG tablet Take 20 mg by mouth daily. 04/15/13   [provider]  tamsulosin (FLOMAX) 0.4 MG CAPS capsule Take 0.4 mg by mouth daily. 06/13/21   [provider]  bismuth-metronidazole-tetracycline (PLYERA) 140-125-125 MG per capsule Take 3 capsules by mouth 4 (four) times daily -  before meals and at bedtime.  09/26/11  [provider]      Allergies    Aspirin and Bayer aspirin [aspirin]    Review of Systems   Review of Systems  Musculoskeletal:  Positive for arthralgias.  All other systems  reviewed and are negative.   Physical Exam Updated Vital Signs BP 105/68 (BP Location: Left Arm)   Pulse 74   Temp 97.9 F (36.6 C) (Oral)   Resp 18   Ht 5\' 10"  (1.778 m)   Wt 64.9 kg   SpO2 100%   BMI 20.52 kg/m  Physical Exam Vitals and nursing note reviewed.  Constitutional:      Appearance: Normal appearance.  HENT:     Head: Normocephalic and atraumatic.  Eyes:     Conjunctiva/sclera: Conjunctivae normal.  Pulmonary:     Effort: Pulmonary effort is normal. No respiratory distress.  Musculoskeletal:     Comments: Decreased passive ROM of right shoulder to extremes of flexion and abduction.  No deformities noted to the right shoulder, no increased warmth, or wounds.  Patient does have buprenorphine patch over the posterior right shoulder.  Skin:    General: Skin is warm and dry.  Neurological:     Mental Status: He is alert.  Psychiatric:        Mood and Affect: Mood normal.        Behavior: Behavior normal.     ED Results / Procedures / Treatments   Labs (all labs ordered are listed, but only abnormal results are displayed) Labs Reviewed - No data to display  EKG None  Radiology DG Shoulder Right  Result Date: 12/29/2021 CLINICAL DATA:  Right shoulder pain EXAM: RIGHT SHOULDER - 2+ VIEW COMPARISON:  None Available. FINDINGS: No fracture or malalignment. Mild glenohumeral degenerative changes. IMPRESSION: Mild degenerative changes, no acute osseous abnormality Electronically Signed   By: Jasmine Pang [name]   On: 12/29/2021 19:13    Procedures Procedures    Medications Ordered in ED Medications  acetaminophen (TYLENOL) tablet 1,000 mg (1,000 mg Oral Given 12/29/21 2027)    ED Course/ Medical Decision Making/ A&P                           Medical Decision Making Amount and/or Complexity of Data Reviewed Radiology: ordered.  Risk OTC drugs.   This patient is a 81 y.o. male  who presents to the ED for concern of right shoulder pain x 4 months.    Differential diagnoses prior to evaluation: The emergent differential diagnosis includes, but is not limited to, old fracture or dislocation, ligamentous injury, biceps tendinitis, adhesive capsulitis. This is not an exhaustive differential.   Past Medical History / Co-morbidities: Hyperlipidemia, hypertension, chronic back pain, GERD, diabetes  Physical Exam: Physical exam performed. The pertinent findings include: Decreased passive ROM of the right shoulder to extremes of flexion and abduction, no deformity noted.  Lab Tests/Imaging studies: I personally interpreted labs/imaging and the pertinent results include: Right shoulder x-ray showed no fractures dislocations. I agree with the radiologist interpretation.   Medications: I ordered medication  including Tylenol.  I have reviewed the patients home medicines and have made adjustments as needed.   Disposition: After consideration of the diagnostic results and the patients response to treatment, I feel that emergency department workup does not suggest an emergent condition requiring admission or immediate intervention beyond what has been performed at this time. Think symptoms are most likely related to soft tissue chronic injury such as adhesive capsulitis. Will give orthopedic referral and recommend anti-inflammatories. The patient is safe for discharge and has been instructed to return immediately for worsening symptoms, change in symptoms or any other concerns.   Final Clinical Impression(s) / ED Diagnoses Final diagnoses:  Chronic right shoulder pain    Rx / DC Orders ED Discharge Orders          Ordered    AMB referral to orthopedics        12/29/21 2028           Portions of this report may have been transcribed using voice recognition software. Every effort was made to ensure accuracy; however, inadvertent computerized transcription errors may be present.    Jeanella Flattery 12/29/21 2057    Derwood Kaplan, MD 12/30/21 [phone]

## 2021-12-29 NOTE — ED Triage Notes (Signed)
Pt presents to ED with complaints of right shoulder pain progressively getting worse x last couple months.

## 2022-01-01 ENCOUNTER — Ambulatory Visit (INDEPENDENT_AMBULATORY_CARE_PROVIDER_SITE_OTHER): Payer: Medicare HMO | Admitting: Urology

## 2022-01-01 ENCOUNTER — Ambulatory Visit: Payer: Medicare HMO | Admitting: Urology

## 2022-01-01 ENCOUNTER — Encounter: Payer: Self-pay | Admitting: Urology

## 2022-01-01 VITALS — BP 148/67 | HR 67

## 2022-01-01 DIAGNOSIS — T83490A Other mechanical complication of penile (implanted) prosthesis, initial encounter: Secondary | ICD-10-CM

## 2022-01-03 ENCOUNTER — Emergency Department (HOSPITAL_COMMUNITY)
Admission: EM | Admit: 2022-01-03 | Discharge: 2022-01-03 | Disposition: A | Discharge status: Home / Self Care | Payer: Medicare HMO | Attending: Emergency Medicine | Admitting: Emergency Medicine

## 2022-01-03 ENCOUNTER — Other Ambulatory Visit: Payer: Self-pay

## 2022-01-03 ENCOUNTER — Encounter (HOSPITAL_COMMUNITY): Payer: Self-pay | Admitting: Emergency Medicine

## 2022-01-03 DIAGNOSIS — H5789 Other specified disorders of eye and adnexa: Secondary | ICD-10-CM | POA: Diagnosis present

## 2022-01-03 DIAGNOSIS — Y92007 Garden or yard of unspecified non-institutional (private) residence as the place of occurrence of the external cause: Secondary | ICD-10-CM | POA: Insufficient documentation

## 2022-01-03 MED ORDER — TETRACAINE HCL 0.5 % OP SOLN
2.0000 [drp] | Freq: Once | OPHTHALMIC | Status: AC
Start: 2022-01-03 — End: 2022-01-03
  Administered 2022-01-03: 2 [drp] via OPHTHALMIC
  Filled 2022-01-03: qty 4

## 2022-01-03 MED ORDER — FLUORESCEIN SODIUM 1 MG OP STRP
1.0000 | ORAL_STRIP | Freq: Once | OPHTHALMIC | Status: AC
Start: 2022-01-03 — End: 2022-01-03
  Administered 2022-01-03: 1 via OPHTHALMIC
  Filled 2022-01-03: qty 1

## 2022-01-03 MED ORDER — TOBRAMYCIN 0.3 % OP SOLN: 1.0000 [drp] | OPHTHALMIC | 0 refills | Status: AC

## 2022-01-03 NOTE — ED Notes (Signed)
Used several flushes of normal saline to flush right eye. Pt states that it feels better than before.

## 2022-01-03 NOTE — ED Notes (Signed)
Evalee Jefferson, PA at bedside

## 2022-01-03 NOTE — ED Provider Notes (Signed)
[facility] EMERGENCY DEPARTMENT Provider Note   CSN: [id number] Arrival date & time: 01/03/22  1002     History  Chief Complaint  Patient presents with   [name] is a 81 y.o. male presenting for evaluation of a foreign body sensation in his right eye since last night.  He states he was working in the yard yesterday and picked up a Designer, industrial/product but had a yard chemical and it sprayed a little bit towards him he did feel a few drops on his face but at the time did not feel he got any of this chemical in his eyes.  Later that evening he developed a foreign body sensation in the right eye.  It is persistent only above the right upper eyelid and worse with blinking.  He denies any vision changes from his baseline.  He did wake this morning with crusted eyelid margins and had a washes eyelid in order to open the eye.  He has had no continued drainage from the eye today.  He has had no treatment for this condition prior to arrival.  The history is provided by the patient.       Home Medications Prior to Admission medications   Medication Sig Start Date End Date Taking? Authorizing Provider  tobramycin (TOBREX) 0.3 % ophthalmic solution Place 1 drop into the right eye every 4 (four) hours for 5 days. 01/03/22 01/08/22 Yes Farrin Shadle, Almyra Free, PA-C  acyclovir (ZOVIRAX) 200 MG capsule Take 200 mg by mouth as needed. 01/07/14   [provider]  amLODipine (NORVASC) 10 MG tablet Take 10 mg by mouth daily.    [provider]  Blood Glucose Monitoring Suppl ([place]) w/Device KIT USE TO TEST TWICE DAILY 09/30/19   [provider]  buprenorphine (BUTRANS) 15 MCG/HR Place onto the skin once a week.    [provider]  cetirizine (ZYRTEC) 10 MG tablet Take 10 mg by mouth daily. 01/12/20   [provider]  gabapentin (NEURONTIN) 100 MG capsule Take 1 capsule (100 mg total) by mouth 3 (three) times daily. 07/07/21   Mordecai Rasmussen, MD   gabapentin (NEURONTIN) 300 MG capsule SMARTSIG:1 Capsule(s) By Mouth 12/29/21   [provider]  glyBURIDE-metformin (GLUCOVANCE) 5-500 MG per tablet Take 2 tablets by mouth 2 (two) times daily with a meal.     [provider]  hydrochlorothiazide (HYDRODIURIL) 12.5 MG tablet Take 12.5 mg by mouth daily. 07/20/21   [provider]  JARDIANCE 25 MG TABS tablet Take 25 mg by mouth daily. 08/11/21   [provider]  labetalol (NORMODYNE) 200 MG tablet Take 200 mg by mouth daily at 6 (six) AM.    [provider]  labetalol (NORMODYNE) 300 MG tablet Take 300 mg by mouth 3 (three) times daily. 08/05/21   [provider]  Lancets (ONETOUCH DELICA PLUS [id number]) [place] USE TO TEST TWICE DAILY 09/30/19   [provider]  metFORMIN (GLUCOPHAGE) 1000 MG tablet Take 1,000 mg by mouth 2 (two) times daily. 06/23/21   [provider]  metoprolol succinate (TOPROL-XL) 50 MG 24 hr tablet Take 50 mg by mouth daily. Take with or immediately following a meal.    [provider]  naproxen (NAPROSYN) 500 MG tablet Take 500 mg by mouth 2 (two) times daily with a meal.    [provider]  omeprazole (PRILOSEC) 40 MG capsule Take 1 capsule (40 mg total) by mouth daily. 04/17/21  Montez Morita, Quillian Quince, MD  [facility] VERIO test strip USE TO TEST TWICE DAILY 09/30/19   [provider]  oxyCODONE-acetaminophen (PERCOCET) 5-325 MG tablet Take 1 tablet by mouth every 4 (four) hours as needed for severe pain. 08/22/21 08/22/22  Cleon Gustin, MD  pantoprazole (PROTONIX) 40 MG tablet Take 40 mg by mouth daily. 07/10/21   [provider]  polyethylene glycol powder (GLYCOLAX) 17 GM/SCOOP powder Take 17 g by mouth daily. 04/17/21   Harvel Quale, MD  ramipril (ALTACE) 10 MG capsule Take 10 mg by mouth daily.  02/05/14   [provider]  simvastatin (ZOCOR) 20 MG tablet Take 20 mg by mouth daily. 04/15/13    [provider]  tamsulosin (FLOMAX) 0.4 MG CAPS capsule Take 0.4 mg by mouth daily. 06/13/21   [provider]  bismuth-metronidazole-tetracycline (PLYERA) 140-125-125 MG per capsule Take 3 capsules by mouth 4 (four) times daily -  before meals and at bedtime.  09/26/11  [provider]      Allergies    Aspirin and Bayer aspirin [aspirin]    Review of Systems   Review of Systems  Constitutional:  Negative for fever.  HENT:  Negative for congestion.   Eyes:  Positive for pain and discharge. Negative for photophobia, redness and visual disturbance.  Respiratory: Negative.    Cardiovascular: Negative.   Gastrointestinal: Negative.   Genitourinary: Negative.   Musculoskeletal:  Negative for arthralgias, joint swelling and neck pain.  Skin: Negative.  Negative for rash and wound.  Neurological:  Negative for dizziness, weakness, light-headedness, numbness and headaches.  Psychiatric/Behavioral: Negative.      Physical Exam Updated Vital Signs BP (!) 162/78 (BP Location: Right Arm)   Pulse (!) 58   Temp 97.8 F (36.6 C) (Oral)   Resp 19   Ht '5\' 10"'  (1.778 m)   Wt 64.9 kg   SpO2 100%   BMI 20.52 kg/m  Physical Exam Vitals and nursing note reviewed.  Constitutional:      Appearance: He is well-developed.  HENT:     Head: Normocephalic and atraumatic.  Eyes:     General: Lids are everted, no foreign bodies appreciated.        Right eye: No foreign body or discharge.     Extraocular Movements: Extraocular movements intact.     Conjunctiva/sclera: Conjunctivae normal.     Right eye: Right conjunctiva is not injected. No chemosis or exudate.    Pupils: Pupils are equal, round, and reactive to light.     Right eye: No corneal abrasion or fluorescein uptake.     Slit lamp exam:    Right eye: Anterior chamber quiet.     Comments: Cotton swab used to sweep conjunctiva upper lid, no obvious fb appreciated, but pt reports resolution of sensation after this  tx.   Cardiovascular:     Rate and Rhythm: Normal rate.  Pulmonary:     Effort: Pulmonary effort is normal.  Musculoskeletal:        General: Normal range of motion.     Cervical back: Normal range of motion.  Skin:    General: Skin is warm and dry.  Neurological:     Mental Status: He is alert.    Visual Acuity  Bilateral Distance: 20/50  R Distance: 20/50  L Distance: 20/50   ED Results / Procedures / Treatments   Labs (all labs ordered are listed, but only abnormal results are displayed) Labs Reviewed - No data to display  EKG None  Radiology No results found.  Procedures Procedures    Medications Ordered in ED Medications  fluorescein ophthalmic strip 1 strip (1 strip Both Eyes Given 01/03/22 1210)  tetracaine (PONTOCAINE) 0.5 % ophthalmic solution 2 drop (2 drops Both Eyes Given 01/03/22 1210)    ED Course/ Medical Decision Making/ A&P                           Medical Decision Making Patient with probable foreign body underneath his upper eyelid, improved after sweeping and flushing, however I did not appreciate an obvious foreign body.  He was observed in the department until the tetracaine had completely dissipated, he was symptom-free at time of discharge.  Given the crusting drainage when he woke this morning we will cover him with antibiotics which were prescribed.  Discussed other home treatments, close follow-up for any persistent or return of symptoms.  Risk Prescription drug management.           Final Clinical Impression(s) / ED Diagnoses Final diagnoses:  Eye irritation    Rx / DC Orders ED Discharge Orders          Ordered    tobramycin (TOBREX) 0.3 % ophthalmic solution  Every 4 hours        01/03/22 1347              Evalee Jefferson, PA-C 01/05/22 1002    Milton Ferguson, MD 01/05/22 1714

## 2022-01-03 NOTE — Discharge Instructions (Signed)
Your eye exam today is reassuring, however given your symptoms you are being covered for the possibility of a conjunctivitis infection, therefore has been prescribed an antibiotic drop to put in your eye for the next 5 days.  Get rechecked for any return or worsening of symptoms.

## 2022-01-03 NOTE — ED Triage Notes (Signed)
Pt to the ED with complaints of right eye pain since last night. He states he feels like he has something in his eye.

## 2022-01-15 ENCOUNTER — Other Ambulatory Visit (INDEPENDENT_AMBULATORY_CARE_PROVIDER_SITE_OTHER): Payer: Self-pay

## 2022-01-15 ENCOUNTER — Encounter (INDEPENDENT_AMBULATORY_CARE_PROVIDER_SITE_OTHER): Payer: Self-pay | Admitting: Gastroenterology

## 2022-01-15 ENCOUNTER — Ambulatory Visit (INDEPENDENT_AMBULATORY_CARE_PROVIDER_SITE_OTHER): Payer: Medicare HMO | Admitting: Gastroenterology

## 2022-01-15 ENCOUNTER — Encounter (INDEPENDENT_AMBULATORY_CARE_PROVIDER_SITE_OTHER): Payer: Self-pay

## 2022-01-15 VITALS — BP 129/66 | HR 62 | Temp 97.8°F | Ht 70.0 in | Wt 137.4 lb

## 2022-01-15 DIAGNOSIS — R1033 Periumbilical pain: Secondary | ICD-10-CM | POA: Diagnosis not present

## 2022-01-15 DIAGNOSIS — R109 Unspecified abdominal pain: Secondary | ICD-10-CM | POA: Insufficient documentation

## 2022-01-15 DIAGNOSIS — R634 Abnormal weight loss: Secondary | ICD-10-CM

## 2022-01-15 MED ORDER — DICYCLOMINE HCL 10 MG PO CAPS: 10.0000 mg | ORAL_CAPSULE | Freq: Two times a day (BID) | ORAL | 2 refills | Status: DC | PRN

## 2022-01-15 NOTE — Progress Notes (Signed)
Katrinka Blazing, [name] Gastroenterology & Hepatology [facility] [address] [place], [place] [address]  Primary Care Physician: Benetta Spar, MD [address] [place] [address]  I will communicate my assessment and recommendations to the referring MD via EMR.  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 male with past medical history of esophageal dysmotility, diabetes, GERD complicated by Schatzki's ring, hypertension, opiate induced constipation, esophageal dysmotility, hyperlipidemia and OSA,   who presents for follow up of constipation and weight loss.  The patient was last seen on 10/16/2021. At that time, the patient was advised to continue rehab once every day he was ordered a CT abdomen pelvis with IV contrast for evaluation of persistent abdominal pain.  CT was performed on 11/21/2021 which showed presence of circumferential wall thickening in the cecum and proximal ascending colon involving 6 cm which was concerning for malignancy, along with constipation and diverticulosis, also presence of gastric dilation.  Due to this he underwent a colonoscopy on the same month which showed presence of polyp but no masses, findings were possibly related to stool coating the walls of the colon.  States he is still having significant pain in his periumbilical area. States the pain is intermittent but does get better after taking lemon water. Has lost 6 lb since the last time he was seen in clinic. He is not taking any protein supplements. The patient denies having any nausea, frequent vomiting, fever, chills, hematochezia, melena, hematemesis, abdominal distention, diarrhea, jaundice, pruritus .  Notably he reports been taking naproxen 500 mg for arthritis and carpal tunnel  syndrome, which he has been taking possibly last year.  Last EGD: 02/10/2020, esophagus was normal, and empiric dilation via [facility] dilator was performed up to [address] [place], there was no presence of mucosal disruption.  Stomach and small bowel were normal.  Last Colonoscopy: 11/28/2021 The perianal and digital rectal examinations were normal. Six sessile polyps were found in the ascending colon. The polyps were 2 to 6 mm in size. These polyps were removed with a cold snare. Resection and retrieval were complete for 3 polyps, but the other 3 could not be retrieved (fully resected). The ascending colon and cecum appeared normal othwerwise. CT scan findings likely related to stool. Non-bleeding internal hemorrhoids were found during retroflexion. The hemorrhoids were small.  Past Medical History: Past Medical History:  Diagnosis Date   Arthritis    fingers, back   Chronic back pain    Diabetes mellitus Since 1995   Takes Glucovance and Onglyza daily   Diabetes mellitus without complication (HCC)    GERD (gastroesophageal reflux disease)    takes Dexilant and Omeprazole daily   Herpes    History of colon polyps    History of gastric ulcer 25+yrs ago   Hyperlipidemia Since 1995   takes Simvastatin daily   Hypertension Since 1995   takes Metoprolol and Ramipril daily   Hypertension    Pneumonia    hx of;as a child   Sleep apnea    doesn't use CPAP   Urinary frequency    Urinary urgency    takes Flomax daily    Past Surgical History: Past Surgical History:  Procedure Laterality Date   BACK SURGERY  1962   Lumbar   BACK SURGERY     bilateral cataract surgery     CARDIAC CATHETERIZATION  02/16/10   CERVICAL SPINE SURGERY     COLONOSCOPY     COLONOSCOPY N/A 04/23/2013   Procedure: COLONOSCOPY;  Surgeon: Rogene Houston, MD;  [facility];  Service: Endoscopy;  Laterality: N/A;  1030   COLONOSCOPY N/A 07/24/2018   Procedure: COLONOSCOPY;  Surgeon: Rogene Houston, MD;   [facility];  Service: Endoscopy;  Laterality: N/A;  8:30   COLONOSCOPY WITH PROPOFOL N/A 11/28/2021   Procedure: COLONOSCOPY WITH PROPOFOL;  Surgeon: Harvel Quale, MD;  [facility];  Service: Gastroenterology;  Laterality: N/A;  205   ESOPHAGEAL DILATION N/A 02/10/2020   Procedure: ESOPHAGEAL DILATION;  Surgeon: Rogene Houston, MD;  [facility];  Service: Endoscopy;  Laterality: N/A;   ESOPHAGOGASTRODUODENOSCOPY N/A 12/24/2014   Procedure: ESOPHAGOGASTRODUODENOSCOPY (EGD);  Surgeon: Rogene Houston, MD;  [facility];  Service: Endoscopy;  Laterality: N/A;  2:45   ESOPHAGOGASTRODUODENOSCOPY (EGD) WITH ESOPHAGEAL DILATION     ESOPHAGOGASTRODUODENOSCOPY (EGD) WITH PROPOFOL N/A 02/10/2020   Procedure: ESOPHAGOGASTRODUODENOSCOPY (EGD) WITH PROPOFOL;  Surgeon: Rogene Houston, MD;  [facility];  Service: Endoscopy;  Laterality: N/A;  250   HAND SURGERY     Right   LUMBAR LAMINECTOMY/DECOMPRESSION MICRODISCECTOMY N/A 08/29/2012   Procedure: LUMBAR LAMINECTOMY/DECOMPRESSION MICRODISCECTOMY 1 LEVEL;  Surgeon: Floyce Stakes, MD;  [facility];  Service: Neurosurgery;  Laterality: N/A;  Lumbar four-five laminectomies   NECK SURGERY     PENILE PROSTHESIS IMPLANT     PENILE PROSTHESIS IMPLANT N/A 08/07/2021   Procedure: PENILE PROTHESIS INFLATABLE;  Surgeon: Cleon Gustin, MD;  [facility];  Service: Urology;  Laterality: N/A;   POLYPECTOMY  11/28/2021   Procedure: POLYPECTOMY;  Surgeon: Harvel Quale, MD;  [facility];  Service: Gastroenterology;;   REMOVAL OF PENILE PROSTHESIS N/A 08/07/2021   Procedure: REMOVAL OF PENILE PROSTHESIS;  Surgeon: Cleon Gustin, MD;  [facility];  Service: Urology;  Laterality: N/A;   ROTATOR CUFF REPAIR     Right   SHOULDER SURGERY     TRANSURETHRAL RESECTION OF PROSTATE N/A 05/25/2019   Procedure: TRANSURETHRAL RESECTION OF THE PROSTATE (TURP);   Surgeon: Cleon Gustin, MD;  Location: [facility];  Service: Urology;  Laterality: N/A;   WRIST SURGERY     Left   WRIST SURGERY      Family History: Family History  Problem Relation Age of Onset   Heart attack Father 63       MI   Hypertension Father    Heart attack Brother 76       MI   Diabetes Mother    Heart attack Paternal Uncle     Social History: Social History   Tobacco Use  Smoking Status Never  Smokeless Tobacco Never   Social History   Substance and Sexual Activity  Alcohol Use Not Currently   Comment: occasionally    Social History   Substance and Sexual Activity  Drug Use Yes   Frequency: 3.0 times per week   Types: Marijuana   Comment: last on 11/26/21    Allergies: Allergies  Allergen Reactions   Aspirin Other (See Comments)    Higher dosage upsets stomach   Bayer Aspirin [Aspirin] Other (See Comments)    Stomach "flares up"    Medications: Current Outpatient Medications  Medication Sig Dispense Refill   acyclovir (ZOVIRAX) 200 MG capsule Take 200 mg by mouth as needed.     amLODipine (  NORVASC) 10 MG tablet Take 10 mg by mouth daily.     Blood Glucose Monitoring Suppl (ONETOUCH VERIO FLEX SYSTEM) w/Device KIT USE TO TEST TWICE DAILY     buprenorphine (BUTRANS) 15 MCG/HR Place onto the skin once a week.     cetirizine (ZYRTEC) 10 MG tablet Take 10 mg by mouth daily.     glyBURIDE-metformin (GLUCOVANCE) 5-500 MG per tablet Take 2 tablets by mouth 2 (two) times daily with a meal.      hydrochlorothiazide (HYDRODIURIL) 12.5 MG tablet Take 12.5 mg by mouth daily.     JARDIANCE 25 MG TABS tablet Take 25 mg by mouth daily.     Lancets (ONETOUCH DELICA PLUS TOIZTI45Y) MISC USE TO TEST TWICE DAILY     metFORMIN (GLUCOPHAGE) 1000 MG tablet Take 1,000 mg by mouth 2 (two) times daily.     metoprolol succinate (TOPROL-XL) 50 MG 24 hr tablet Take 50 mg by mouth daily. Take with or immediately following a meal.     naproxen (NAPROSYN) 500 MG tablet  Take 500 mg by mouth 2 (two) times daily with a meal.     ONETOUCH VERIO test strip USE TO TEST TWICE DAILY     oxyCODONE-acetaminophen (PERCOCET) 5-325 MG tablet Take 1 tablet by mouth every 4 (four) hours as needed for severe pain. 30 tablet 0   polyethylene glycol powder (GLYCOLAX) 17 GM/SCOOP powder Take 17 g by mouth daily. 850 g 3   ramipril (ALTACE) 10 MG capsule Take 10 mg by mouth daily.      simvastatin (ZOCOR) 20 MG tablet Take 20 mg by mouth daily.     tamsulosin (FLOMAX) 0.4 MG CAPS capsule Take 0.4 mg by mouth daily.     gabapentin (NEURONTIN) 100 MG capsule Take 1 capsule (100 mg total) by mouth 3 (three) times daily. 90 capsule 0   gabapentin (NEURONTIN) 300 MG capsule SMARTSIG:1 Capsule(s) By Mouth     labetalol (NORMODYNE) 200 MG tablet Take 200 mg by mouth daily at 6 (six) AM.     labetalol (NORMODYNE) 300 MG tablet Take 300 mg by mouth 3 (three) times daily.     omeprazole (PRILOSEC) 40 MG capsule Take 1 capsule (40 mg total) by mouth daily. 90 capsule 3   pantoprazole (PROTONIX) 40 MG tablet Take 40 mg by mouth daily.     No current facility-administered medications for this visit.    Review of Systems: GENERAL: negative for malaise, night sweats HEENT: No changes in hearing or vision, no nose bleeds or other nasal problems. NECK: Negative for lumps, goiter, pain and significant neck swelling RESPIRATORY: Negative for cough, wheezing CARDIOVASCULAR: Negative for chest pain, leg swelling, palpitations, orthopnea GI: SEE HPI MUSCULOSKELETAL: Negative for joint pain or swelling, back pain, and muscle pain. SKIN: Negative for lesions, rash PSYCH: Negative for sleep disturbance, mood disorder and recent psychosocial stressors. HEMATOLOGY Negative for prolonged bleeding, bruising easily, and swollen nodes. ENDOCRINE: Negative for cold or heat intolerance, polyuria, polydipsia and goiter. NEURO: negative for tremor, gait imbalance, syncope and seizures. The remainder of  the review of systems is noncontributory.   Physical Exam: BP 129/66 (BP Location: Left Arm, Patient Position: Sitting, Cuff Size: Small)   Pulse 62   Temp 97.8 F (36.6 C) (Oral)   Ht $R'5\' 10"'vj$  (1.778 m)   Wt 137 lb 6.4 oz (62.3 kg)   BMI 19.71 kg/m  GENERAL: The patient is AO x3, in no acute distress. HEENT: Head is normocephalic and atraumatic. EOMI are  intact. Mouth is well hydrated and without lesions. NECK: Supple. No masses LUNGS: Clear to auscultation. No presence of rhonchi/wheezing/rales. Adequate chest expansion HEART: RRR, normal s1 and s2. ABDOMEN: mildly tender in the periumbilical area, no guarding, no peritoneal signs, and nondistended. BS +. No masses. EXTREMITIES: Without any cyanosis, clubbing, rash, lesions or edema. NEUROLOGIC: AOx3, no focal motor deficit. SKIN: no jaundice, no rashes  Imaging/Labs: as above  I personally reviewed and interpreted the available labs, imaging and endoscopic files.  Impression and Plan: [name] is a 81 y.o. male with past medical history of esophageal dysmotility, diabetes, GERD complicated by Schatzki's ring, hypertension, opiate induced constipation, esophageal dysmotility, hyperlipidemia and OSA,   who presents for follow up of constipation and weight loss.  The patient has had recent CT of the abdomen and colonoscopy that did not show any abnormalities that would explain his weight loss and abdominal pain.  I suspect that given his use of opiates, he may have significant drug-induced gastroparesis as evidenced in the CT scan.  We will evaluate for organic conditions with a repeat EGD at the moment but I advised him to take Bentyl as needed if he presents significant abdominal pain and to continue MiraLAX 3 capfuls every day for management of his constipation.  Other potential contributors to his pain may be related to chronic NSAID use which he needs to avoid.  He can increase the intake of Glucerna to increase his  weight.   - Schedule EGD - Start Bentyl 1 tablet q12h as needed for abdominal pain - Continue Miralax 3 capfuls every day - Discuss with PCP other etiologies of weight loss - Can take Glucerna at least two times a day - Stop using high dose aspirin including Goody/BC powders, NSAIDs such as Aleve, ibuprofen, naproxen, Motrin, Voltaren or Advil (even the topical ones)  All questions were answered.      Harvel Quale, MD Gastroenterology and Hepatology [facility]

## 2022-01-15 NOTE — Patient Instructions (Addendum)
Schedule EGD Start Bentyl 1 tablet q12h as needed for abdominal pain Continue Miralax 3 capfuls every day Discuss with PCP other etiologies of weight loss Can take Glucerna at least two times a day Stop using high dose aspirin including Goody/BC powders, NSAIDs such as Aleve, ibuprofen, naproxen, Motrin, Voltaren or Advil (even the topical ones)

## 2022-01-19 ENCOUNTER — Ambulatory Visit: Payer: Medicare HMO | Admitting: Orthopedic Surgery

## 2022-01-19 ENCOUNTER — Encounter: Payer: Self-pay | Admitting: Orthopedic Surgery

## 2022-01-19 DIAGNOSIS — M7581 Other shoulder lesions, right shoulder: Secondary | ICD-10-CM | POA: Diagnosis not present

## 2022-01-19 NOTE — Progress Notes (Signed)
Orthopaedic Clinic Return  Assessment: [name] is a 81 y.o. male with the following: Right shoulder rotator cuff tendonitis  Plan: [name] continues to have right shoulder pain.  Previously had an injection, with limited sustained relief.  However, he is not a good surgical candidate.  As result, I recommended a repeat injection.  Encouraged him to follow-up for his EGD, as well as a cervical spine CT scan.  He elects to proceed with the injection.  Follow-up as needed.  Procedure note injection - Right shoulder    Verbal consent was obtained to inject the right shoulder, subacromial space Timeout was completed to confirm the site of injection.   The skin was prepped with alcohol and ethyl chloride was sprayed at the injection site.  A 21-gauge needle was used to inject 40 mg of Depo-Medrol and 1% lidocaine (3 cc) into the subacromial space of the right shoulder using a posterolateral approach.  There were no complications.  A sterile bandage was applied.     Follow-up: Return if symptoms worsen or fail to improve.   Subjective:  Chief Complaint  Patient presents with   Shoulder Pain    RT shoulder/seen in Dec 22 for same thing. Received injection. Patient states that the injection only seemed to help for about 4 hours    History of Present Illness: [name] is a 81 y.o. male who returns to clinic for repeat evaluation of right shoulder pain.  I have not seen him in clinic for several months.  He notes last injection lasted less than a day.  He continues to have progressively worsening right shoulder pain.  He does have a history of injury.  No recent injury.  Continues to have numbness and tingling, and atrophy in the right upper extremity.  He also notes that he is losing weight.  He is scheduled for an EGD, as well as a CT scan of his neck.    Review of Systems: No fevers or chills + numbness and tingling No chest pain No shortness of breath No  bowel or bladder dysfunction No GI distress No headaches   Objective: There were no vitals taken for this visit.  Physical Exam:  Right shoulder with atrophy of the posterior shoulder blade, as well as the biceps area.  Decreased muscle mass in the first webspace.  Decreased sensation of the right hand.  Active forward flexion limited to 90 degrees.  He does tolerate 120 degrees of passive forward flexion.  Limited range of motion otherwise.  4/5 strength in right shoulder.  IMAGING: I personally ordered and reviewed the following images:  No new imaging obtained today.   Mordecai Rasmussen, MD 01/19/2022 10:37 PM

## 2022-01-19 NOTE — Patient Instructions (Addendum)

## 2022-02-02 ENCOUNTER — Encounter (HOSPITAL_COMMUNITY): Payer: Self-pay

## 2022-02-02 ENCOUNTER — Ambulatory Visit (HOSPITAL_COMMUNITY): Payer: Medicare HMO

## 2022-02-15 ENCOUNTER — Other Ambulatory Visit: Payer: Self-pay

## 2022-02-15 ENCOUNTER — Encounter (HOSPITAL_COMMUNITY)
Admission: RE | Admit: 2022-02-15 | Discharge: 2022-02-15 | Disposition: A | Discharge status: Home / Self Care | Payer: Medicare HMO | Source: Ambulatory Visit | Attending: Gastroenterology | Admitting: Gastroenterology

## 2022-02-15 ENCOUNTER — Encounter (HOSPITAL_COMMUNITY): Payer: Self-pay

## 2022-02-15 NOTE — Patient Instructions (Signed)
[name] [address] [place] [address]  PLEASE READ ALL INSTRUCTIONS CAREFULLY BEFORE YOUR ENDOSCOPY    Procedure date and time to be at [facility]: Friday 02/23/22 the hospital will give you arrival time at your pre op appointment  ([place], [place] [address]).   You will need to have a responsible adult immediately available after your procedure to receive discharge instructions then drive you home. We strongly encourage your responsible adult to remain in the hospital during your procedure.  Your procedure will be canceled if a responsible adult is not available.  If you have any questions you may call Darius Bump at [phone]  Please notify is If you take weight loss medication or any other blood thinners.   You can take your medication as normal unless something is listed below to hold or adjust.   You must refrain from smoking 24 hours prior to the procedure  On Thursday 02/22/22  START YOUR CLEAR LIQUIDS NO SOLID FOODS!!   you can have clear liquids up until 4 hours before your arrival time at the hospital but NOTHING to eat or drink during the 4 hour time period. Stop liquids at 0430 AM Friday morning.   THIS IS A LIST OF CLEAR LIQUIDS THAT YOU CAN HAVE DURING YOUR CLEAR LIQUID TIME: Jell-O (NOT RED), popsicles (NOT RED), apple juice or white grape juice, Kool-Aid (NOT RED), soft drinks (any flavor except cherry), water, coffee or tea (NO CREAM or MILK), broth (beef/chicken/vegetable)  THIS LIST IS WHAT YOU CANNOT HAVE: beer, wine, liquor or alcoholic beverage, Swanson's broth, dairy products, cranberry juice, tomato juice, V8 juice, grapefruit juice, orange juice, red grape juice, or any solid foods such as cereal, oatmeal, yogurt, fruits, vegetables, creamed soup, eggs, bread, etc.    Be at the hospital at 0830 am on Friday, Aug 12 and register at the main entrance.  DO NOT take any medications for diabetes the morning of your  procedure.    Take your norvasc, neurontin, zyrtec, normodyne, metoprolol, prilosec, percocet(if needed), protonix and flomax with as little water as possible Friday morning.

## 2022-02-20 ENCOUNTER — Encounter (HOSPITAL_COMMUNITY): Admission: RE | Admit: 2022-02-20 | Payer: Medicare HMO | Source: Ambulatory Visit

## 2022-02-23 ENCOUNTER — Ambulatory Visit (HOSPITAL_COMMUNITY)
Admission: RE | Admit: 2022-02-23 | Discharge: 2022-02-23 | Disposition: A | Discharge status: Home / Self Care | Payer: Medicare HMO | Attending: Gastroenterology | Admitting: Gastroenterology

## 2022-02-23 ENCOUNTER — Ambulatory Visit (HOSPITAL_BASED_OUTPATIENT_CLINIC_OR_DEPARTMENT_OTHER): Payer: Medicare HMO | Admitting: Anesthesiology

## 2022-02-23 ENCOUNTER — Ambulatory Visit (HOSPITAL_COMMUNITY): Payer: Medicare HMO | Admitting: Anesthesiology

## 2022-02-23 ENCOUNTER — Encounter (HOSPITAL_COMMUNITY)
Admission: RE | Disposition: A | Discharge status: Home / Self Care | Payer: Self-pay | Source: Home / Self Care | Attending: Gastroenterology

## 2022-02-23 DIAGNOSIS — Z7984 Long term (current) use of oral hypoglycemic drugs: Secondary | ICD-10-CM | POA: Insufficient documentation

## 2022-02-23 DIAGNOSIS — A048 Other specified bacterial intestinal infections: Secondary | ICD-10-CM | POA: Insufficient documentation

## 2022-02-23 DIAGNOSIS — E119 Type 2 diabetes mellitus without complications: Secondary | ICD-10-CM | POA: Diagnosis not present

## 2022-02-23 DIAGNOSIS — K3189 Other diseases of stomach and duodenum: Secondary | ICD-10-CM | POA: Diagnosis not present

## 2022-02-23 DIAGNOSIS — K2960 Other gastritis without bleeding: Secondary | ICD-10-CM | POA: Insufficient documentation

## 2022-02-23 DIAGNOSIS — R109 Unspecified abdominal pain: Secondary | ICD-10-CM

## 2022-02-23 DIAGNOSIS — G4730 Sleep apnea, unspecified: Secondary | ICD-10-CM | POA: Diagnosis not present

## 2022-02-23 DIAGNOSIS — I10 Essential (primary) hypertension: Secondary | ICD-10-CM

## 2022-02-23 DIAGNOSIS — R634 Abnormal weight loss: Secondary | ICD-10-CM | POA: Insufficient documentation

## 2022-02-23 DIAGNOSIS — K922 Gastrointestinal hemorrhage, unspecified: Secondary | ICD-10-CM

## 2022-02-23 DIAGNOSIS — M1990 Unspecified osteoarthritis, unspecified site: Secondary | ICD-10-CM | POA: Insufficient documentation

## 2022-02-23 DIAGNOSIS — K219 Gastro-esophageal reflux disease without esophagitis: Secondary | ICD-10-CM | POA: Insufficient documentation

## 2022-02-23 DIAGNOSIS — R1010 Upper abdominal pain, unspecified: Secondary | ICD-10-CM | POA: Diagnosis not present

## 2022-02-23 DIAGNOSIS — Z79899 Other long term (current) drug therapy: Secondary | ICD-10-CM | POA: Diagnosis not present

## 2022-02-23 HISTORY — PX: ESOPHAGOGASTRODUODENOSCOPY (EGD) WITH PROPOFOL: SHX5813

## 2022-02-23 HISTORY — PX: BIOPSY: SHX5522

## 2022-02-23 LAB — GLUCOSE, CAPILLARY: Glucose-Capillary: 104 mg/dL — ABNORMAL HIGH (ref 70–99)

## 2022-02-23 SURGERY — ESOPHAGOGASTRODUODENOSCOPY (EGD) WITH PROPOFOL
Anesthesia: General

## 2022-02-23 MED ORDER — LIDOCAINE HCL (CARDIAC) PF 100 MG/5ML IV SOSY
PREFILLED_SYRINGE | INTRAVENOUS | Status: DC | PRN
  Administered 2022-02-23: 60 mg via INTRATRACHEAL

## 2022-02-23 MED ORDER — PROPOFOL 10 MG/ML IV BOLUS
INTRAVENOUS | Status: DC | PRN
  Administered 2022-02-23: 20 mg via INTRAVENOUS
  Administered 2022-02-23: 80 mg via INTRAVENOUS
  Administered 2022-02-23 (×3): 20 mg via INTRAVENOUS

## 2022-02-23 MED ORDER — LACTATED RINGERS IV SOLN: INTRAVENOUS | Status: DC | PRN

## 2022-02-23 MED ORDER — LACTATED RINGERS IV SOLN
INTRAVENOUS | Status: DC
Start: 2022-02-23 — End: 2022-02-23

## 2022-02-23 NOTE — Op Note (Addendum)
[facility] Patient Name: [name] Procedure Date: 02/23/2022 10:43 AM MRN: [id number] Date of Birth: 03-25-1941 Attending MD: [name] ,  CSN: [id number] Age: 81 Admit Type: Outpatient Procedure:                Upper GI endoscopy Indications:              Upper abdominal pain, Weight loss Providers:                [name], Crystal Page, Raphael Gibney,                            Technician Referring MD:              Medicines:                Monitored Anesthesia Care Complications:            No immediate complications. Estimated Blood Loss:     Estimated blood loss: none. Procedure:                Pre-Anesthesia Assessment:                           - Prior to the procedure, a History and Physical                            was performed, and patient medications, allergies                            and sensitivities were reviewed. The patient's                            tolerance of previous anesthesia was reviewed.                           - The risks and benefits of the procedure and the                            sedation options and risks were discussed with the                            patient. All questions were answered and informed                            consent was obtained.                           - ASA Grade Assessment: III - A patient with severe                            systemic disease.                           After obtaining informed consent, the endoscope was                            passed under direct vision. Throughout the  procedure, the patient's blood pressure, pulse, and                            oxygen saturations were monitored continuously. The                            [id number]-H190 ([id number]) scope was introduced through the                            mouth, and advanced to the second part of duodenum.                            The upper GI endoscopy was accomplished without                             difficulty. The patient tolerated the procedure                            well. Scope In: 10:51:47 AM Scope Out: 11:06:25 AM Total Procedure Duration: 0 hours 14 minutes 38 seconds  Findings:      The examined esophagus was normal.      Congested mucosa was found in a focal area in the gastric fundus, just       distal to the GE junction. Biopsies were taken with a cold forceps for       histology.      Multiple localized small erosions with stigmata of recent bleeding were       found in the gastric body. Biopsies from largest erosion, body and       antrum were taken with a cold forceps for histology and H. pylori.      The examined duodenum was normal. Impression:               - Normal esophagus.                           - Congestive gastropathy. Biopsied.                           - Erosive gastropathy with stigmata of recent                            bleeding. Biopsied.                           - Normal examined duodenum. Moderate Sedation:      Per Anesthesia Care Recommendation:           - Discharge patient to home (ambulatory).                           - Resume previous diet.                           - Await pathology results.                           - No ibuprofen, naproxen, or other non-steroidal  anti-inflammatory drugs.                           - Continue present medications.                           - H/ pylori serology Procedure Code(s):        --- Professional ---                           [id number], Esophagogastroduodenoscopy, flexible,                            transoral; with biopsy, single or multiple Diagnosis Code(s):        --- Professional ---                           K31.89, Other diseases of stomach and duodenum                           K92.2, Gastrointestinal hemorrhage, unspecified                           R10.10, Upper abdominal pain, unspecified                           R63.4, Abnormal weight  loss CPT copyright 2019 American Medical Association. All rights reserved. The codes documented in this report are preliminary and upon coder review may  be revised to meet current compliance requirements. [name], MD [name],  02/23/2022 11:13:40 AM This report has been signed electronically. Number of Addenda: 0

## 2022-02-23 NOTE — Transfer of Care (Signed)
Immediate Anesthesia Transfer of Care Note  Patient: [name]  Procedure(s) Performed: ESOPHAGOGASTRODUODENOSCOPY (EGD) WITH PROPOFOL BIOPSY  Patient Location: Short Stay  Anesthesia Type:General  Level of Consciousness: sedated  Airway & Oxygen Therapy: Patient Spontanous Breathing  Post-op Assessment: Report given to RN and Post -op Vital signs reviewed and stable  Post vital signs: Reviewed and stable  Last Vitals:  Vitals Value Taken Time  BP 127/58   Temp 36   Pulse 66   Resp 18   SpO2 96     Last Pain:  Vitals:   02/23/22 1048  PainSc: 0-No pain         Complications: No notable events documented.

## 2022-02-23 NOTE — Anesthesia Postprocedure Evaluation (Signed)
Anesthesia Post Note  Patient: [name]  Procedure(s) Performed: ESOPHAGOGASTRODUODENOSCOPY (EGD) WITH PROPOFOL BIOPSY  Patient location during evaluation: Phase II Anesthesia Type: General Level of consciousness: awake and alert and oriented Pain management: pain level controlled Vital Signs Assessment: post-procedure vital signs reviewed and stable Respiratory status: spontaneous breathing, nonlabored ventilation and respiratory function stable Cardiovascular status: blood pressure returned to baseline and stable Postop Assessment: no apparent nausea or vomiting Anesthetic complications: no   No notable events documented.   Last Vitals:  Vitals:   02/23/22 1030 02/23/22 1111  BP:  106/64  Pulse: (!) 52 60  Resp: 11 12  Temp:  (!) 35.8 C  SpO2: 100% 100%    Last Pain:  Vitals:   02/23/22 1111  TempSrc: Axillary  PainSc: 0-No pain                 Carmelia Tiner C Nephi Savage

## 2022-02-23 NOTE — H&P (Signed)
[name] is an 81 y.o. male.   Chief Complaint: Weight loss HPI: [name] is a 81 y.o. male with past medical history of esophageal dysmotility, diabetes, GERD complicated by Schatzki's ring, hypertension, opiate induced constipation, esophageal dysmotility, hyperlipidemia and OSA, coming for evaluation of weight loss.  Patient reports that he is not eating too much but denies any nausea or vomiting.  He has presented some upper abdominal pain.  Past Medical History:  Diagnosis Date   Arthritis    fingers, back   Chronic back pain    Diabetes mellitus Since 1995   Takes Glucovance and Onglyza daily   Diabetes mellitus without complication ([place])    GERD (gastroesophageal reflux disease)    takes Dexilant and Omeprazole daily   Herpes    History of colon polyps    History of gastric ulcer 25+yrs ago   Hyperlipidemia Since 1995   takes Simvastatin daily   Hypertension Since 1995   takes Metoprolol and Ramipril daily   Hypertension    Pneumonia    hx of;as a child   Sleep apnea    doesn't use CPAP   Urinary frequency    Urinary urgency    takes Flomax daily    Past Surgical History:  Procedure Laterality Date   BACK SURGERY  1962   Lumbar   BACK SURGERY     bilateral cataract surgery     CARDIAC CATHETERIZATION  02/16/10   CERVICAL SPINE SURGERY     COLONOSCOPY     COLONOSCOPY N/A 04/23/2013   Procedure: COLONOSCOPY;  Surgeon: Rogene Houston, MD;  [facility];  Service: Endoscopy;  Laterality: N/A;  1030   COLONOSCOPY N/A 07/24/2018   Procedure: COLONOSCOPY;  Surgeon: Rogene Houston, MD;  [facility];  Service: Endoscopy;  Laterality: N/A;  8:30   COLONOSCOPY WITH PROPOFOL N/A 11/28/2021   Procedure: COLONOSCOPY WITH PROPOFOL;  Surgeon: Harvel Quale, MD;  [facility];  Service: Gastroenterology;  Laterality: N/A;  205   ESOPHAGEAL DILATION N/A 02/10/2020   Procedure: ESOPHAGEAL DILATION;  Surgeon: Rogene Houston, MD;  [facility];  Service: Endoscopy;  Laterality: N/A;   ESOPHAGOGASTRODUODENOSCOPY N/A 12/24/2014   Procedure: ESOPHAGOGASTRODUODENOSCOPY (EGD);  Surgeon: Rogene Houston, MD;  [facility];  Service: Endoscopy;  Laterality: N/A;  2:45   ESOPHAGOGASTRODUODENOSCOPY (EGD) WITH ESOPHAGEAL DILATION     ESOPHAGOGASTRODUODENOSCOPY (EGD) WITH PROPOFOL N/A 02/10/2020   Procedure: ESOPHAGOGASTRODUODENOSCOPY (EGD) WITH PROPOFOL;  Surgeon: Rogene Houston, MD;  [facility];  Service: Endoscopy;  Laterality: N/A;  250   HAND SURGERY     Right   LUMBAR LAMINECTOMY/DECOMPRESSION MICRODISCECTOMY N/A 08/29/2012   Procedure: LUMBAR LAMINECTOMY/DECOMPRESSION MICRODISCECTOMY 1 LEVEL;  Surgeon: Floyce Stakes, MD;  Location: [facility];  Service: Neurosurgery;  Laterality: N/A;  Lumbar four-five laminectomies   NECK SURGERY     PENILE PROSTHESIS IMPLANT     PENILE PROSTHESIS IMPLANT N/A 08/07/2021   Procedure: PENILE PROTHESIS INFLATABLE;  Surgeon: Cleon Gustin, MD;  Location: [facility];  Service: Urology;  Laterality: N/A;   POLYPECTOMY  11/28/2021   Procedure: POLYPECTOMY;  Surgeon: Harvel Quale, MD;  [facility];  Service: Gastroenterology;;   REMOVAL OF PENILE PROSTHESIS N/A 08/07/2021   Procedure: REMOVAL OF PENILE PROSTHESIS;  Surgeon: Cleon Gustin, MD;  Location: [facility];  Service: Urology;  Laterality: N/A;   ROTATOR CUFF REPAIR  Right   SHOULDER SURGERY     TRANSURETHRAL RESECTION OF PROSTATE N/A 05/25/2019   Procedure: TRANSURETHRAL RESECTION OF THE PROSTATE (TURP);  Surgeon: Cleon Gustin, MD;  Location: [facility];  Service: Urology;  Laterality: N/A;   WRIST SURGERY     Left   WRIST SURGERY      Family History  Problem Relation Age of Onset   Heart attack Father 48       MI   Hypertension Father    Heart attack Brother 20       MI   Diabetes Mother    Heart attack Paternal Uncle    Social History:   reports that he has never smoked. He has never used smokeless tobacco. He reports that he does not currently use alcohol. He reports current drug use. Frequency: 3.00 times per week. Drug: Marijuana.  Allergies:  Allergies  Allergen Reactions   Aspirin Other (See Comments)    Higher dosage upsets stomach   Bayer Aspirin [Aspirin] Other (See Comments)    Stomach "flares up"    Medications Prior to Admission  Medication Sig Dispense Refill   acyclovir (ZOVIRAX) 200 MG capsule Take 200 mg by mouth as needed.     amLODipine (NORVASC) 10 MG tablet Take 10 mg by mouth daily.     Blood Glucose Monitoring Suppl (ONETOUCH VERIO FLEX SYSTEM) w/Device KIT USE TO TEST TWICE DAILY     buprenorphine (BUTRANS) 15 MCG/HR Place onto the skin once a week.     cetirizine (ZYRTEC) 10 MG tablet Take 10 mg by mouth daily.     dicyclomine (BENTYL) 10 MG capsule Take 1 capsule (10 mg total) by mouth every 12 (twelve) hours as needed (abdominal pain). 60 capsule 2   gabapentin (NEURONTIN) 100 MG capsule Take 1 capsule (100 mg total) by mouth 3 (three) times daily. 90 capsule 0   gabapentin (NEURONTIN) 300 MG capsule SMARTSIG:1 Capsule(s) By Mouth     glyBURIDE-metformin (GLUCOVANCE) 5-500 MG per tablet Take 2 tablets by mouth 2 (two) times daily with a meal.      hydrochlorothiazide (HYDRODIURIL) 12.5 MG tablet Take 12.5 mg by mouth daily.     JARDIANCE 25 MG TABS tablet Take 25 mg by mouth daily.     labetalol (NORMODYNE) 200 MG tablet Take 200 mg by mouth daily at 6 (six) AM.     labetalol (NORMODYNE) 300 MG tablet Take 300 mg by mouth 3 (three) times daily.     Lancets (ONETOUCH DELICA PLUS [id number]) MISC USE TO TEST TWICE DAILY     metFORMIN (GLUCOPHAGE) 1000 MG tablet Take 1,000 mg by mouth 2 (two) times daily.     metoprolol succinate (TOPROL-XL) 50 MG 24 hr tablet Take 50 mg by mouth daily. Take with or immediately following a meal.     naproxen (NAPROSYN) 500 MG tablet Take 500 mg by mouth 2 (two) times  daily with a meal.     omeprazole (PRILOSEC) 40 MG capsule Take 1 capsule (40 mg total) by mouth daily. 90 capsule 3   ONETOUCH VERIO test strip USE TO TEST TWICE DAILY     oxyCODONE-acetaminophen (PERCOCET) 5-325 MG tablet Take 1 tablet by mouth every 4 (four) hours as needed for severe pain. 30 tablet 0   pantoprazole (PROTONIX) 40 MG tablet Take 40 mg by mouth daily.     polyethylene glycol powder (GLYCOLAX) 17 GM/SCOOP powder Take 17 g by mouth daily. 850 g 3   ramipril (ALTACE) 10 MG  capsule Take 10 mg by mouth daily.      simvastatin (ZOCOR) 20 MG tablet Take 20 mg by mouth daily.     tamsulosin (FLOMAX) 0.4 MG CAPS capsule Take 0.4 mg by mouth daily.      Results for orders placed or performed during the hospital encounter of 02/23/22 (from the past 48 hour(s))  Glucose, capillary     Status: Abnormal   Collection Time: 02/23/22 10:18 AM  Result Value Ref Range   Glucose-Capillary 104 (H) 70 - 99 mg/dL    Comment: Glucose reference range applies only to samples taken after fasting for at least 8 hours.   No results found.  Review of Systems  Constitutional:  Positive for unexpected weight change.  HENT: Negative.    Eyes: Negative.   Respiratory: Negative.    Cardiovascular: Negative.   Gastrointestinal: Negative.   Endocrine: Negative.   Genitourinary: Negative.   Musculoskeletal: Negative.   Skin: Negative.   Allergic/Immunologic: Negative.   Neurological: Negative.   Hematological: Negative.   Psychiatric/Behavioral: Negative.      Blood pressure (!) 176/80, pulse (!) 52, temperature 97.7 F (36.5 C), resp. rate 11, SpO2 100 %. Physical Exam  GENERAL: The patient is AO x3, in no acute distress. HEENT: Head is normocephalic and atraumatic. EOMI are intact. Mouth is well hydrated and without lesions. NECK: Supple. No masses LUNGS: Clear to auscultation. No presence of rhonchi/wheezing/rales. Adequate chest expansion HEART: RRR, normal s1 and s2. ABDOMEN: Soft,  nontender, no guarding, no peritoneal signs, and nondistended. BS +. No masses. EXTREMITIES: Without any cyanosis, clubbing, rash, lesions or edema. NEUROLOGIC: AOx3, no focal motor deficit. SKIN: no jaundice, no rashes  Assessment/Plan [name] is a 81 y.o. male with past medical history of esophageal dysmotility, diabetes, GERD complicated by Schatzki's ring, hypertension, opiate induced constipation, esophageal dysmotility, hyperlipidemia and OSA, coming for evaluation of weight loss.  We will proceed with EGD.  Harvel Quale, MD 02/23/2022, 10:46 AM

## 2022-02-23 NOTE — Anesthesia Preprocedure Evaluation (Signed)
Anesthesia Evaluation  Patient identified by MRN, date of birth, ID band Patient awake    Reviewed: Allergy & Precautions, NPO status , Patient's Chart, lab work & pertinent test results  Airway Mallampati: II  TM Distance: >3 FB Neck ROM: Full   Comment: Neck sx Dental  (+) Caps, Missing, Dental Advisory Given, Chipped   Pulmonary sleep apnea , pneumonia, resolved, Patient abstained from smoking.,    Pulmonary exam normal breath sounds clear to auscultation       Cardiovascular Exercise Tolerance: Good hypertension, Pt. on medications Normal cardiovascular exam Rhythm:Regular Rate:Normal     Neuro/Psych negative neurological ROS  negative psych ROS   GI/Hepatic Neg liver ROS, GERD  Medicated and Controlled,(+)       marijuana use,   Endo/Other  diabetes, Well Controlled, Type 2, Oral Hypoglycemic Agents  Renal/GU negative Renal ROS  negative genitourinary   Musculoskeletal  (+) Arthritis , Osteoarthritis,    Abdominal   Peds negative pediatric ROS (+)  Hematology negative hematology ROS (+)   Anesthesia Other Findings   Reproductive/Obstetrics negative OB ROS                             Anesthesia Physical  Anesthesia Plan  ASA: 3  Anesthesia Plan: General   Post-op Pain Management: Minimal or no pain anticipated   Induction: Intravenous  PONV Risk Score and Plan: Propofol infusion  Airway Management Planned: Nasal Cannula and Natural Airway  Additional Equipment:   Intra-op Plan:   Post-operative Plan:   Informed Consent: I have reviewed the patients History and Physical, chart, labs and discussed the procedure including the risks, benefits and alternatives for the proposed anesthesia with the patient or authorized representative who has indicated his/her understanding and acceptance.     Dental advisory given  Plan Discussed with: CRNA and Surgeon  Anesthesia  Plan Comments:         Anesthesia Quick Evaluation

## 2022-02-26 LAB — H. PYLORI ANTIBODY, IGG: H Pylori IgG: 6.21 Index Value — ABNORMAL HIGH (ref 0.00–0.79)

## 2022-02-27 LAB — SURGICAL PATHOLOGY

## 2022-02-28 ENCOUNTER — Other Ambulatory Visit (INDEPENDENT_AMBULATORY_CARE_PROVIDER_SITE_OTHER): Payer: Self-pay | Admitting: Gastroenterology

## 2022-02-28 DIAGNOSIS — B9681 Helicobacter pylori [H. pylori] as the cause of diseases classified elsewhere: Secondary | ICD-10-CM

## 2022-02-28 MED ORDER — METRONIDAZOLE 500 MG PO TABS: 500.0000 mg | ORAL_TABLET | Freq: Three times a day (TID) | ORAL | 0 refills | Status: DC

## 2022-02-28 MED ORDER — TETRACYCLINE HCL 500 MG PO CAPS: 500.0000 mg | ORAL_CAPSULE | Freq: Four times a day (QID) | ORAL | 0 refills | Status: DC

## 2022-02-28 MED ORDER — PANTOPRAZOLE SODIUM 40 MG PO TBEC: 40.0000 mg | DELAYED_RELEASE_TABLET | Freq: Two times a day (BID) | ORAL | 0 refills | Status: DC

## 2022-02-28 MED ORDER — BISMUTH 262 MG PO CHEW: 2.0000 | CHEWABLE_TABLET | Freq: Four times a day (QID) | ORAL | 0 refills | Status: DC

## 2022-03-01 ENCOUNTER — Encounter (INDEPENDENT_AMBULATORY_CARE_PROVIDER_SITE_OTHER): Payer: Self-pay | Admitting: *Deleted

## 2022-03-01 ENCOUNTER — Encounter (HOSPITAL_COMMUNITY): Payer: Self-pay | Admitting: Gastroenterology

## 2022-03-02 ENCOUNTER — Other Ambulatory Visit (INDEPENDENT_AMBULATORY_CARE_PROVIDER_SITE_OTHER): Payer: Self-pay | Admitting: Gastroenterology

## 2022-03-02 ENCOUNTER — Telehealth (INDEPENDENT_AMBULATORY_CARE_PROVIDER_SITE_OTHER): Payer: Self-pay | Admitting: *Deleted

## 2022-03-02 DIAGNOSIS — R112 Nausea with vomiting, unspecified: Secondary | ICD-10-CM

## 2022-03-02 MED ORDER — PROCHLORPERAZINE MALEATE 10 MG PO TABS: 10.0000 mg | ORAL_TABLET | Freq: Three times a day (TID) | ORAL | 0 refills | Status: DC

## 2022-03-02 NOTE — Telephone Encounter (Signed)
I sent Compazine 10 mg every 8 hours so he can take this medication with metronidazole and avoid the vomiting episodes. I informed the patient about this.

## 2022-03-02 NOTE — Telephone Encounter (Signed)
Patient started treatment for h pylori yesterday. He states the metronidazole is making his stomach hurt and vomiting. Took 3 doses yesterday and would have vomiting right after. Did not take this morning. Still feeling like he has to vomit. Reports he does not drink alcholol and last drink was 5 years ago.   [facility] scales.   [name]

## 2022-03-05 ENCOUNTER — Ambulatory Visit (HOSPITAL_COMMUNITY)
Admission: RE | Admit: 2022-03-05 | Discharge: 2022-03-05 | Disposition: A | Discharge status: Home / Self Care | Payer: Medicare HMO | Source: Ambulatory Visit | Attending: Neurology | Admitting: Neurology

## 2022-03-05 ENCOUNTER — Encounter (HOSPITAL_COMMUNITY): Payer: Self-pay

## 2022-03-05 DIAGNOSIS — M5010 Cervical disc disorder with radiculopathy, unspecified cervical region: Secondary | ICD-10-CM | POA: Diagnosis present

## 2022-03-05 LAB — POCT I-STAT CREATININE: Creatinine, Ser: 1 mg/dL (ref 0.61–1.24)

## 2022-03-05 MED ORDER — IOHEXOL 300 MG/ML  SOLN
100.0000 mL | Freq: Once | INTRAMUSCULAR | Status: AC | PRN
  Administered 2022-03-05: 75 mL via INTRAVENOUS

## 2022-03-08 ENCOUNTER — Other Ambulatory Visit (INDEPENDENT_AMBULATORY_CARE_PROVIDER_SITE_OTHER): Payer: Self-pay | Admitting: Gastroenterology

## 2022-03-08 ENCOUNTER — Telehealth (INDEPENDENT_AMBULATORY_CARE_PROVIDER_SITE_OTHER): Payer: Self-pay | Admitting: *Deleted

## 2022-03-08 DIAGNOSIS — B9681 Helicobacter pylori [H. pylori] as the cause of diseases classified elsewhere: Secondary | ICD-10-CM

## 2022-03-08 MED ORDER — CLARITHROMYCIN 500 MG PO TABS: 500.0000 mg | ORAL_TABLET | Freq: Two times a day (BID) | ORAL | 0 refills | Status: AC

## 2022-03-08 MED ORDER — AMOXICILLIN 500 MG PO TABS: 1000.0000 mg | ORAL_TABLET | Freq: Two times a day (BID) | ORAL | 0 refills | Status: AC

## 2022-03-08 MED ORDER — PANTOPRAZOLE SODIUM 40 MG PO TBEC: 40.0000 mg | DELAYED_RELEASE_TABLET | Freq: Two times a day (BID) | ORAL | 0 refills | Status: DC

## 2022-03-08 NOTE — Telephone Encounter (Signed)
Patient walked in office. He is still having some vomiting when he takes flagyl. He told me he was taking the nausea med sent in 30 - 40 mins before he takes this medication and he said no vomiting yesterday but today he vomiting 4 times and saw some blood in the vomit.   [facility].

## 2022-03-08 NOTE — Telephone Encounter (Signed)
He will need to stop Flagyl, tetracycline and bismuth altogether. I sent clarithromycin, amoxicillin and pantoprazole twice a day for 2 more weeks Please let him know about this

## 2022-03-08 NOTE — Telephone Encounter (Signed)
Discussed with patient. He does want to stop flagyl and try a different med. [facility]

## 2022-03-08 NOTE — Telephone Encounter (Signed)
Discussed with patient per Dr. Jenetta Downer - He will need to stop Flagyl, tetracycline and bismuth altogether. I sent clarithromycin, amoxicillin and pantoprazole twice a day for 2 more weeks  Patient verbalized understanding.

## 2022-03-08 NOTE — Telephone Encounter (Signed)
I called the patient to discuss his symptoms. As he has presented recurrent nausea and vomiting, he may be very intolerant to metronidazole and we may need to put him on an oral regimen (may need to try clarithromycin and amoxicillin based therapy).   As the patient did not answer my call, I left a detailed voice message to call back and if interested I will send this prescription.

## 2022-04-11 ENCOUNTER — Other Ambulatory Visit: Payer: Self-pay

## 2022-04-11 ENCOUNTER — Emergency Department (HOSPITAL_COMMUNITY)
Admission: EM | Admit: 2022-04-11 | Discharge: 2022-04-11 | Disposition: A | Discharge status: Home / Self Care | Payer: Medicare HMO | Attending: Emergency Medicine | Admitting: Emergency Medicine

## 2022-04-11 ENCOUNTER — Encounter (HOSPITAL_COMMUNITY): Payer: Self-pay | Admitting: Emergency Medicine

## 2022-04-11 DIAGNOSIS — M542 Cervicalgia: Secondary | ICD-10-CM | POA: Insufficient documentation

## 2022-04-11 MED ORDER — KETOROLAC TROMETHAMINE 30 MG/ML IJ SOLN
30.0000 mg | Freq: Once | INTRAMUSCULAR | Status: AC
  Administered 2022-04-11: 30 mg via INTRAMUSCULAR
  Filled 2022-04-11: qty 1

## 2022-04-11 MED ORDER — DICLOFENAC SODIUM 1 % EX GEL: 4.0000 g | Freq: Four times a day (QID) | CUTANEOUS | 0 refills | Status: DC

## 2022-04-11 NOTE — ED Provider Notes (Signed)
[facility] EMERGENCY DEPARTMENT Provider Note   CSN: [id number] Arrival date & time: 04/11/22  1514     History  Chief Complaint  Patient presents with   Neck Pain   Torticollis    [name] is a 81 y.o. male with history of chronic neck pain presents to ED complaining of worsening neck pain and limited range of motion of head and neck.  Patient had cervical spine CT 02/2022 which showed advanced spondylosis, stenosis and arthritis.  Patient states he has not been able to get into a spine specialist.  He states he took a muscle relaxer and some Tylenol at approximately 3 PM today.  Patient's pain is consistent with chronic neck issues, however it is significantly worse today.  Denies numbness, tingling, sudden headache, vision changes, nausea, vomiting, fever, chills, night sweats, syncope, weakness, lightheadedness.      Home Medications Prior to Admission medications   Medication Sig Start Date End Date Taking? Authorizing Provider  acyclovir (ZOVIRAX) 200 MG capsule Take 200 mg by mouth as needed. 01/07/14   [provider]  amLODipine (NORVASC) 10 MG tablet Take 10 mg by mouth daily.    [provider]  Blood Glucose Monitoring Suppl ([place]) w/Device KIT USE TO TEST TWICE DAILY 09/30/19   [provider]  buprenorphine (BUTRANS) 15 MCG/HR Place onto the skin once a week.    [provider]  cetirizine (ZYRTEC) 10 MG tablet Take 10 mg by mouth daily. 01/12/20   [provider]  dicyclomine (BENTYL) 10 MG capsule Take 1 capsule (10 mg total) by mouth every 12 (twelve) hours as needed (abdominal pain). 01/15/22   Harvel Quale, MD  gabapentin (NEURONTIN) 100 MG capsule Take 1 capsule (100 mg total) by mouth 3 (three) times daily. 07/07/21   Mordecai Rasmussen, MD  gabapentin (NEURONTIN) 300 MG capsule SMARTSIG:1 Capsule(s) By Mouth 12/29/21   [provider]  glyBURIDE-metformin (GLUCOVANCE) 5-500 MG  per tablet Take 2 tablets by mouth 2 (two) times daily with a meal.     [provider]  hydrochlorothiazide (HYDRODIURIL) 12.5 MG tablet Take 12.5 mg by mouth daily. 07/20/21   [provider]  JARDIANCE 25 MG TABS tablet Take 25 mg by mouth daily. 08/11/21   [provider]  labetalol (NORMODYNE) 200 MG tablet Take 200 mg by mouth daily at 6 (six) AM.    [provider]  labetalol (NORMODYNE) 300 MG tablet Take 300 mg by mouth 3 (three) times daily. 08/05/21   [provider]  Lancets (ONETOUCH DELICA PLUS [id number]) [place] USE TO TEST TWICE DAILY 09/30/19   [provider]  metFORMIN (GLUCOPHAGE) 1000 MG tablet Take 1,000 mg by mouth 2 (two) times daily. 06/23/21   [provider]  metoprolol succinate (TOPROL-XL) 50 MG 24 hr tablet Take 50 mg by mouth daily. Take with or immediately following a meal.    [provider]  [facility] VERIO test strip USE TO TEST TWICE DAILY 09/30/19   [provider]  oxyCODONE-acetaminophen (PERCOCET) 5-325 MG tablet Take 1 tablet by mouth every 4 (four) hours as needed for severe pain. 08/22/21 08/22/22  Cleon Gustin, MD  pantoprazole (PROTONIX) 40 MG tablet Take 40 mg by mouth daily. 07/10/21   [provider]  pantoprazole (PROTONIX) 40 MG tablet Take 1 tablet (40 mg total) by mouth 2 (two) times daily for 14 days. 03/08/22 03/22/22  Harvel Quale, MD  polyethylene glycol powder [facility]  17 GM/SCOOP powder Take 17 g by mouth daily. 04/17/21   Harvel Quale, MD  prochlorperazine (COMPAZINE) 10 MG tablet Take 1 tablet (10 mg total) by mouth every 8 (eight) hours for 14 days. 03/02/22 03/16/22  Harvel Quale, MD  ramipril (ALTACE) 10 MG capsule Take 10 mg by mouth daily.  02/05/14   [provider]  simvastatin (ZOCOR) 20 MG tablet Take 20 mg by mouth daily. 04/15/13   [provider]  tamsulosin (FLOMAX) 0.4 MG CAPS capsule Take 0.4  mg by mouth daily. 06/13/21   [provider]  bismuth-metronidazole-tetracycline (PLYERA) 140-125-125 MG per capsule Take 3 capsules by mouth 4 (four) times daily -  before meals and at bedtime.  09/26/11  [provider]      Allergies    Aspirin and Bayer aspirin [aspirin]    Review of Systems   Review of Systems  Constitutional:  Negative for chills and fever.  Musculoskeletal:  Positive for neck pain.  Neurological:  Negative for weakness, numbness and headaches.    Physical Exam Updated Vital Signs BP (!) 155/79 (BP Location: Right Arm)   Pulse 76   Temp 97.7 F (36.5 C) (Oral)   Resp 16   Ht $R'5\' 10"'Ra$  (1.778 m)   Wt 62.4 kg   SpO2 100%   BMI 19.73 kg/m  Physical Exam Vitals and nursing note reviewed.  Constitutional:      General: He is not in acute distress.    Appearance: Normal appearance. He is not ill-appearing or diaphoretic.  HENT:     Head: Normocephalic and atraumatic.  Neck:     Comments: Patient has significantly decreased range of motion secondary to pain. Cardiovascular:     Rate and Rhythm: Normal rate and regular rhythm.  Pulmonary:     Effort: Pulmonary effort is normal.     Breath sounds: Normal breath sounds.  Musculoskeletal:     Cervical back: Pain with movement, spinous process tenderness and muscular tenderness present. Decreased range of motion.  Neurological:     Mental Status: He is alert. Mental status is at baseline.     Motor: No weakness.  Psychiatric:        Mood and Affect: Mood normal.        Behavior: Behavior normal.     ED Results / Procedures / Treatments   Labs (all labs ordered are listed, but only abnormal results are displayed) Labs Reviewed - No data to display  EKG None  Radiology No results found.  Procedures Procedures    Medications Ordered in ED Medications  ketorolac (TORADOL) 30 MG/ML injection 30 mg (has no administration in time range)    ED Course/ Medical Decision Making/  A&P                           Medical Decision Making Amount and/or Complexity of Data Reviewed External Data Reviewed: radiology.  Risk OTC drugs. Prescription drug management.   This patient presents to the ED for concern of neck pain differential diagnosis includes neck pain secondary to degenerative changes to cervical spine.  Less suspicious of meningitis due to lack of associated symptoms, fever, nausea, vomiting, headache, photophobia.   Additional history obtained:  External records from outside source obtained and reviewed including CT cervical spine from 03/05/2022   Medicines ordered and prescription drug management:  I ordered medication including Toradol IM for pain management Reevaluation of the patient after these medicines  showed that the patient improved I have reviewed the patients home medicines and have made adjustments as needed   Problem List / ED Course:  Neck pain  Discussed with Dr. Sabra Heck assessment and plan who agreed.  Patient to be discharged with topical NSAID and referral to neurosurgery for management of chronic neck pain.         Final Clinical Impression(s) / ED Diagnoses Final diagnoses:  Neck pain    Rx / DC Orders ED Discharge Orders     None         Pat Kocher, [place] 04/11/22 Patrecia Pour    Noemi Chapel, MD 04/13/22 1243

## 2022-04-11 NOTE — ED Provider Notes (Signed)
Medical screening examination/treatment/procedure(s) were conducted as a shared visit with non-physician practitioner(s) and myself.  I personally evaluated the patient during the encounter.  Clinical Impression:   Final diagnoses:  None    81 year old male, chronic neck pain, gradually getting worse, pain seems to radiate up into the back of his skull and down towards his lower cervical spine.  It does not go down his back and he has no numbness or weakness in his arms.  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  Currently being followed by his family doctor who is referring him to spine but he states they are going out of business within the next few months and is not sure that he will be able to follow-up with a specialist.  He is already had a CT scan of his neck which shows pretty severe arthritis and spondylosis, on my exam he has no neurologic symptoms, he is awake alert and afebrile.  Patient stable for discharge, will give a dose of an anti-inflammatory and referral to spine specialty     Noemi Chapel, MD 04/13/22 1243

## 2022-04-11 NOTE — Discharge Instructions (Addendum)
I have discharged you today with a prescription for a topical anti-inflammatory that you can apply up to 4 times a day to your neck to help with pain control.  I have also sent over a referral to a spine specialist.  You will need to call their office to schedule an appointment.  Continue to take muscle relaxers at home.  You may also continue to take Tylenol by mouth as needed for pain control.

## 2022-04-11 NOTE — ED Triage Notes (Signed)
Pt ambulatory to triage with c/o neck pain and stiffness that started last night.  Pt reports pain worsens with head movement.  Denies headache, fever, n/v or other associated symptoms.

## 2022-04-23 ENCOUNTER — Encounter: Payer: Medicare HMO | Admitting: Orthopedic Surgery

## 2022-04-24 ENCOUNTER — Encounter: Payer: Self-pay | Admitting: Orthopedic Surgery

## 2022-04-24 ENCOUNTER — Ambulatory Visit: Payer: Medicare HMO | Admitting: Orthopedic Surgery

## 2022-04-24 VITALS — BP 134/78 | HR 69 | Ht 70.0 in | Wt 134.0 lb

## 2022-04-24 DIAGNOSIS — G5603 Carpal tunnel syndrome, bilateral upper limbs: Secondary | ICD-10-CM | POA: Diagnosis not present

## 2022-04-24 NOTE — Patient Instructions (Signed)
Can follow up after he sees provider for his neck  Bilateral hand muscle wasting, concern for long term nerve compression.  Unclear if symptoms will resolve.

## 2022-04-24 NOTE — Progress Notes (Signed)
Orthopaedic Clinic Return  Assessment: [name] is a 81 y.o. male with the following: Bilateral carpal tunnel syndrome, confirmed with EMGs   Plan: Mr. Faulkenberry has some symptoms consistent with bilateral carpal tunnel syndrome.  On physical exam, mildly positive Tinel's and carpal tunnel compression.  He has atrophy of the thenar musculature bilaterally.  He also has atrophy of the first webspace bilaterally.  I am concerned that consideration for surgery will not resolve his symptoms.  In addition, I am concerned that he has something going on in his neck.  He states he is an appointment scheduled for later this week regarding his neck.  Once he has completed that appointment, I have recommended he consider returning to discuss options.  Once again, I am concerned that surgery will not provide significant relief of his symptoms currently.  There is also the possibility of diabetic polyneuropathy, which could be contributing to his current presentation.  Follow-up as needed.  Follow-up: Return if symptoms worsen or fail to improve.   Subjective:  Chief Complaint  Patient presents with   Hand Pain    Numbness and tingling, both hands; general stiffness    History of Present Illness: RAIN FRIEDT is a 81 y.o. male who returns to clinic for evaluation of bilateral hand pain.  He states he has had pain, numbness and tingling in both hands for several years.  He has tried bracing, without improvement.  He states his hands are very stiff in the morning.  He notes that he cannot get his hands completely straight.  He also has a limited motion and strength of his right arm.  I have injected his shoulder previously, and he states this only improved his pain for a few hours.  He notes difficulty using his right hand.  Limited strength.  He has used 2 hands to drink a cup of coffee.  Review of Systems: No fevers or chills + numbness or tingling No chest pain No shortness of  breath No bowel or bladder dysfunction No GI distress No headaches   Objective: BP 134/78   Pulse 69   Ht '5\' 10"'$  (1.778 m)   Wt 134 lb (60.8 kg)   BMI 19.23 kg/m   Physical Exam:  Elderly male.  Difficult to understand.  Alert and oriented.  Bilateral hands with stiff fingers, flexion contracture of the PIP joint to all fingers.  He is able to make a full fist.  4/5 grip strength on the right.  Mildly positive Tinel's bilaterally.  Mildly positive carpal tunnel compression bilaterally.  Wasting of thenar musculature bilaterally.  Wasting of first dorsal webspace bilaterally.  Sensation is intact in the median nerve distribution.  IMAGING: I personally ordered and reviewed the following images:   EMG results are available in clinic today.  Results demonstrates nerve compression bilaterally.  No mention of cervical radiculopathy.  Cervical CT scan was previously obtained.  Prior C7-T1 fusion.  Advanced cervical spondylolysis.  Mordecai Rasmussen, MD 04/24/2022 9:06 AM

## 2022-05-17 ENCOUNTER — Ambulatory Visit (INDEPENDENT_AMBULATORY_CARE_PROVIDER_SITE_OTHER): Payer: Medicare HMO | Admitting: Gastroenterology

## 2022-05-17 ENCOUNTER — Encounter (INDEPENDENT_AMBULATORY_CARE_PROVIDER_SITE_OTHER): Payer: Self-pay | Admitting: Gastroenterology

## 2022-05-17 VITALS — BP 152/84 | HR 82 | Temp 97.8°F | Ht 70.0 in | Wt 127.7 lb

## 2022-05-17 DIAGNOSIS — B9681 Helicobacter pylori [H. pylori] as the cause of diseases classified elsewhere: Secondary | ICD-10-CM | POA: Diagnosis not present

## 2022-05-17 DIAGNOSIS — K2970 Gastritis, unspecified, without bleeding: Secondary | ICD-10-CM | POA: Diagnosis not present

## 2022-05-17 DIAGNOSIS — R634 Abnormal weight loss: Secondary | ICD-10-CM

## 2022-05-17 DIAGNOSIS — K219 Gastro-esophageal reflux disease without esophagitis: Secondary | ICD-10-CM

## 2022-05-17 NOTE — Progress Notes (Addendum)
Referring Provider: Carrolyn Meiers* Primary Care Physician:  Carrolyn Meiers, MD Primary GI Physician: Jenetta Downer   Chief Complaint  Patient presents with   Follow-up    Patient here today for a follow up appointment. He says he has been having issues with stomach burning sensation. He says he has had some thick mucus as of late.    HPI:   [name] is a 81 y.o. male with past medical history of esophageal dysmotility, diabetes, GERD complicated by Schatzki's ring, hypertension, opiate induced constipation, esophageal dysmotility, hyperlipidemia and OSA   Patient presenting today for follow up   Last seen July 2023, at that time having significant pain in his periumbilical area. States the pain is intermittent but does get better after taking lemon water. Has lost 6 lb since the last time he was seen in clinic. He is not taking any protein supplements. The patient denies having any nausea, frequent vomiting, fever, chills, hematochezia, melena, hematemesis, abdominal distention, diarrhea, jaundice, pruritus .   Notably he reports been taking naproxen 500 mg for arthritis and carpal tunnel syndrome, which he has been taking possibly last year.  Scheduled for EGD, start bentyl 99m q12h, discuss weight loss with PCP, glucerna BID, stop frequent NSAID use.   EGD as outlined below with + H pylori, treated with quadruple therapy, did not tolerate, started on clarithromycin, amox and pantoprazole BID x2 weeks   Present:  States he completed previous H pylori treatment, felt better for a few weeks after then started having burning in his mid to upper abdomen again. He is unsure of precipitating factors. He notes when he wakes up in the morning his tongue sometimes feels coated. He is regurgitating up some thick mucus, almost every morning. He has some burning in his chest at times as well. He is unsure if he is taking pantoprazole or not. Appetite is good and he is eating  well. Denies dysphagia odynophagia. He is continuing to lose weight, 127 lbs now down from 137 in July. He states he did talk to PCP about it but has plans to discuss again at upcoming appt with them. He is not doing any protein shakes currently. Denies rectal bleeding or melena.    Last EGD; 02/23/22- Normal esophagus. - Congestive gastropathy. Biopsied. - Erosive gastropathy with stigmata of recent bleeding. Biopsied- h pylopri, quat therapy - Normal examined duodenum. Last Colonoscopy: 11/28/21 The perianal and digital rectal examinations were normal. Six sessile polyps were found in the ascending colon. The polyps were 2 to 6 mm in size. These polyps were removed with a cold snare. Resection and retrieval were complete for 3 polyps, but the other 3 could not be retrieved (fully resected). The ascending colon and cecum appeared normal othwerwise. CT scan findings likely related to stool. Non-bleeding internal hemorrhoids were found during retroflexion. The hemorrhoids were small.   Recommendations:  Egd 3 years  Past Medical History:  Diagnosis Date   Arthritis    fingers, back   Chronic back pain    Diabetes mellitus Since 1995   Takes Glucovance and Onglyza daily   Diabetes mellitus without complication ([place]    GERD (gastroesophageal reflux disease)    takes Dexilant and Omeprazole daily   Herpes    History of colon polyps    History of gastric ulcer 25+yrs ago   Hyperlipidemia Since 1995   takes Simvastatin daily   Hypertension Since 1995   takes Metoprolol and Ramipril daily   Hypertension  Pneumonia    hx of;as a child   Sleep apnea    doesn't use CPAP   Urinary frequency    Urinary urgency    takes Flomax daily    Past Surgical History:  Procedure Laterality Date   BACK SURGERY  1962   Lumbar   BACK SURGERY     bilateral cataract surgery     BIOPSY  02/23/2022   Procedure: BIOPSY;  Surgeon: Harvel Quale, MD;  [facility];  Service:  Gastroenterology;;   CARDIAC CATHETERIZATION  02/16/10   CERVICAL SPINE SURGERY     COLONOSCOPY     COLONOSCOPY N/A 04/23/2013   Procedure: COLONOSCOPY;  Surgeon: Rogene Houston, MD;  [facility]: [facility];  Service: Endoscopy;  Laterality: N/A;  1030   COLONOSCOPY N/A 07/24/2018   Procedure: COLONOSCOPY;  Surgeon: Rogene Houston, MD;  [facility]: [facility];  Service: Endoscopy;  Laterality: N/A;  8:30   COLONOSCOPY WITH PROPOFOL N/A 11/28/2021   Procedure: COLONOSCOPY WITH PROPOFOL;  Surgeon: Harvel Quale, MD;  [facility]: [facility];  Service: Gastroenterology;  Laterality: N/A;  205   ESOPHAGEAL DILATION N/A 02/10/2020   Procedure: ESOPHAGEAL DILATION;  Surgeon: Rogene Houston, MD;  [facility];  Service: Endoscopy;  Laterality: N/A;   ESOPHAGOGASTRODUODENOSCOPY N/A 12/24/2014   Procedure: ESOPHAGOGASTRODUODENOSCOPY (EGD);  Surgeon: Rogene Houston, MD;  [facility];  Service: Endoscopy;  Laterality: N/A;  2:45   ESOPHAGOGASTRODUODENOSCOPY (EGD) WITH ESOPHAGEAL DILATION     ESOPHAGOGASTRODUODENOSCOPY (EGD) WITH PROPOFOL N/A 02/10/2020   Procedure: ESOPHAGOGASTRODUODENOSCOPY (EGD) WITH PROPOFOL;  Surgeon: Rogene Houston, MD;  [facility];  Service: Endoscopy;  Laterality: N/A;  250   ESOPHAGOGASTRODUODENOSCOPY (EGD) WITH PROPOFOL N/A 02/23/2022   Procedure: ESOPHAGOGASTRODUODENOSCOPY (EGD) WITH PROPOFOL;  Surgeon: Harvel Quale, MD;  [facility];  Service: Gastroenterology;  Laterality: N/A;  1030   HAND SURGERY     Right   LUMBAR LAMINECTOMY/DECOMPRESSION MICRODISCECTOMY N/A 08/29/2012   Procedure: LUMBAR LAMINECTOMY/DECOMPRESSION MICRODISCECTOMY 1 LEVEL;  Surgeon: Floyce Stakes, MD;  Location: [facility];  Service: Neurosurgery;  Laterality: N/A;  Lumbar four-five laminectomies   NECK SURGERY     PENILE PROSTHESIS IMPLANT     PENILE PROSTHESIS IMPLANT N/A 08/07/2021   Procedure: PENILE PROTHESIS INFLATABLE;   Surgeon: Cleon Gustin, MD;  Location: [facility];  Service: Urology;  Laterality: N/A;   POLYPECTOMY  11/28/2021   Procedure: POLYPECTOMY;  Surgeon: Harvel Quale, MD;  [facility];  Service: Gastroenterology;;   REMOVAL OF PENILE PROSTHESIS N/A 08/07/2021   Procedure: REMOVAL OF PENILE PROSTHESIS;  Surgeon: Cleon Gustin, MD;  Location: [facility];  Service: Urology;  Laterality: N/A;   ROTATOR CUFF REPAIR     Right   SHOULDER SURGERY     TRANSURETHRAL RESECTION OF PROSTATE N/A 05/25/2019   Procedure: TRANSURETHRAL RESECTION OF THE PROSTATE (TURP);  Surgeon: Cleon Gustin, MD;  Location: [facility];  Service: Urology;  Laterality: N/A;   WRIST SURGERY     Left   WRIST SURGERY      Current Outpatient Medications  Medication Sig Dispense Refill   acyclovir (ZOVIRAX) 200 MG capsule Take 200 mg by mouth as needed.     amLODipine (NORVASC) 10 MG tablet Take 10 mg by mouth daily.     Blood Glucose Monitoring Suppl (ONETOUCH VERIO FLEX SYSTEM) w/Device KIT USE TO TEST TWICE DAILY     buprenorphine (BUTRANS)  15 MCG/HR Place onto the skin once a week.     cetirizine (ZYRTEC) 10 MG tablet Take 10 mg by mouth daily.     diclofenac Sodium (VOLTAREN) 1 % GEL Apply 4 g topically 4 (four) times daily. 150 g 0   dicyclomine (BENTYL) 10 MG capsule Take 1 capsule (10 mg total) by mouth every 12 (twelve) hours as needed (abdominal pain). 60 capsule 2   gabapentin (NEURONTIN) 300 MG capsule SMARTSIG:1 Capsule(s) By Mouth     glyBURIDE-metformin (GLUCOVANCE) 5-500 MG per tablet Take 2 tablets by mouth 2 (two) times daily with a meal.      hydrochlorothiazide (HYDRODIURIL) 12.5 MG tablet Take 12.5 mg by mouth daily.     JARDIANCE 25 MG TABS tablet Take 25 mg by mouth daily.     labetalol (NORMODYNE) 200 MG tablet Take 200 mg by mouth daily at 6 (six) AM.     Lancets (ONETOUCH DELICA PLUS ULAGTX64W) MISC USE TO TEST TWICE DAILY     metFORMIN (GLUCOPHAGE) 1000 MG tablet Take 1,000  mg by mouth 2 (two) times daily.     metoprolol succinate (TOPROL-XL) 50 MG 24 hr tablet Take 50 mg by mouth daily. Take with or immediately following a meal.     ONETOUCH VERIO test strip USE TO TEST TWICE DAILY     oxyCODONE-acetaminophen (PERCOCET) 5-325 MG tablet Take 1 tablet by mouth every 4 (four) hours as needed for severe pain. 30 tablet 0   pantoprazole (PROTONIX) 40 MG tablet Take 40 mg by mouth daily.     polyethylene glycol powder (GLYCOLAX) 17 GM/SCOOP powder Take 17 g by mouth daily. (Patient taking differently: Take 17 g by mouth as needed.) 850 g 3   prochlorperazine (COMPAZINE) 10 MG tablet Take 1 tablet (10 mg total) by mouth every 8 (eight) hours for 14 days. 42 tablet 0   ramipril (ALTACE) 10 MG capsule Take 10 mg by mouth daily.      simvastatin (ZOCOR) 20 MG tablet Take 20 mg by mouth daily.     tamsulosin (FLOMAX) 0.4 MG CAPS capsule Take 0.4 mg by mouth daily.     No current facility-administered medications for this visit.    Allergies as of 05/17/2022 - Review Complete 05/17/2022  Allergen Reaction Noted   Aspirin Other (See Comments)    Bayer aspirin [aspirin] Other (See Comments) 06/20/2013    Family History  Problem Relation Age of Onset   Heart attack Father 40       MI   Hypertension Father    Heart attack Brother 71       MI   Diabetes Mother    Heart attack Paternal Uncle     Social History   Socioeconomic History   Marital status: Single    Spouse name: Not on file   Number of children: 8   Years of education: Not on file   Highest education level: Not on file  Occupational History   Occupation: [employer]    Employer: RETIRED    Comment: Retired  Tobacco Use   Smoking status: Never   Smokeless tobacco: Never  Vaping Use   Vaping Use: Never used  Substance and Sexual Activity   Alcohol use: Not Currently    Comment: occasionally    Drug use: Yes    Frequency: 3.0 times per week    Types: Marijuana    Comment: last on  11/26/21   Sexual activity: Never  Other Topics Concern  Not on file  Social History Narrative   ** Merged History Encounter **       Social Determinants of Health   Financial Resource Strain: Not on file  Food Insecurity: Not on file  Transportation Needs: Not on file  Physical Activity: Not on file  Stress: Not on file  Social Connections: Not on file    Review of systems General: negative for malaise, night sweats, fever, chills, +weight loss  Neck: Negative for lumps, goiter, pain and significant neck swelling Resp: Negative for cough, wheezing, dyspnea at rest CV: Negative for chest pain, leg swelling, palpitations, orthopnea GI: denies melena, hematochezia, nausea, vomiting, diarrhea, constipation, dysphagia, odyonophagia, early satiety +weight loss +burning in stomach +heartburn  MSK: Negative for joint pain or swelling, back pain, and muscle pain. Derm: Negative for itching or rash Psych: Denies depression, anxiety, memory loss, confusion. No homicidal or suicidal ideation.  Heme: Negative for prolonged bleeding, bruising easily, and swollen nodes. Endocrine: Negative for cold or heat intolerance, polyuria, polydipsia and goiter. Neuro: negative for tremor, gait imbalance, syncope and seizures. The remainder of the review of systems is noncontributory.  Physical Exam: BP (!) 152/84 (BP Location: Right Arm, Patient Position: Sitting, Cuff Size: Large)   Pulse 82   Temp 97.8 F (36.6 C) (Temporal)   Ht _0  (1.778 m)   Wt 127 lb 11.2 oz (57.9 kg)   BMI 18.32 kg/m  General:   Alert and oriented. No distress noted. Pleasant and cooperative.  Head:  Normocephalic and atraumatic. Eyes:  Conjuctiva clear without scleral icterus. Mouth:  Oral mucosa pink and moist. Good dentition. No lesions. Heart: Normal rate and rhythm, s1 and s2 heart sounds present.  Lungs: Clear lung sounds in all lobes. Respirations equal and unlabored. Abdomen:  +BS, soft, non-tender and  non-distended. No rebound or guarding. No HSM or masses noted. Derm: No palmar erythema or jaundice Msk:  Symmetrical without gross deformities. Normal posture. Extremities:  Without edema. Neurologic:  Alert and  oriented x4 Psych:  Alert and cooperative. Normal mood and affect.  Invalid input(s): "6 MONTHS"   ASSESSMENT: [name] is a 81 y.o. male presenting today for follow up.  EGD in August with presence of H pylori, patient initially prescribed quadruple therapy however had nausea and vomiting secondary to this therefore was prescribed clarithromycin, amoxicillin, and pantoprazole twice daily x2 weeks.  He reports that he did complete the treatment and felt better for the next few weeks however began having burning in his stomach and frequent heartburn shortly thereafter.  He has not had repeat H. pylori testing since treatment was completed.  He is unsure if he is taking Protonix at this time.  Discussed with the patient that we need to repeat H. pylori testing to determine if bacteria was eradicated, however he will need to be off of his PPI x2 weeks prior to this testing being performed.  Depending on results of H. pylori testing, if he is in fact taking his pantoprazole, may need to consider switching to different PPI therapy as he continues to have heartburn. Further recommendations to follow.  Patient continues to lose weight, despite reporting good appetite and eating well. CT A/P in may showed presence of circumferential wall thickening in the cecum and proximal ascending colon involving 6 cm which was concerning for malignancy, along with constipation and diverticulosis, also presence of gastric dilation. He underwent TCS thereafter without findings of any masses. Recent EGD with H pylori, as above. Thus far  no findings to explain weight loss. Patient has upcoming appt with PCP and I encouraged him again to discuss weight loss with them for further evaluation. He should start  gluceran shakes BID along with regular meals.   The patient was found to have elevated blood pressure when vital signs were checked in the office. The blood pressure was rechecked by the nursing staff and it was found be persistently elevated >140/90 mmHg. I advised to the patient to follow up closely with his PCP for hypertension control.   PLAN:  H pylori breath test  2. Stop protonix x 2 weeks prior to h pylori testing 3. Start glucerna BID 4. Discuss weight loss with PCP  All questions were answered, patient verbalized understanding and is in agreement with plan as outlined above.    Follow Up: 3 months   Bence Trapp L. Alver Sorrow, MSN, APRN, AGNP-C Adult-Gerontology Nurse Practitioner [facility]  I have reviewed the note and agree with the APP's assessment as described in this progress note  [name], MD Gastroenterology and Hepatology [facility]astroenterology

## 2022-05-17 NOTE — Patient Instructions (Signed)
if you are taking protonix/pantoprazole, we need to hold this for 2 weeks then repeat h pylori testing, if you are not taking this, please go ahead and complete testing Depending on the results of the test, may consider switching your acid reflux medication Please talk to PCP about weight loss Start glucerna shakes twice daily along with normal meals  Follow up 3 months

## 2022-05-20 LAB — H. PYLORI BREATH TEST: H pylori Breath Test: POSITIVE — AB

## 2022-05-21 ENCOUNTER — Other Ambulatory Visit (INDEPENDENT_AMBULATORY_CARE_PROVIDER_SITE_OTHER): Payer: Self-pay | Admitting: Gastroenterology

## 2022-05-21 MED ORDER — OMEPRAZOLE 40 MG PO CPDR: 40.0000 mg | DELAYED_RELEASE_CAPSULE | Freq: Two times a day (BID) | ORAL | 0 refills | Status: DC

## 2022-05-21 MED ORDER — AMOXICILLIN 500 MG PO TABS: 1000.0000 mg | ORAL_TABLET | Freq: Two times a day (BID) | ORAL | 0 refills | Status: AC

## 2022-05-21 MED ORDER — LEVOFLOXACIN 500 MG PO TABS: 500.0000 mg | ORAL_TABLET | Freq: Every day | ORAL | 0 refills | Status: AC

## 2022-05-22 NOTE — Progress Notes (Signed)
Patient made aware of all states understanding. I have reminder set to make sure he takes the omeprazole for 14 days started on 05/22/2022, will need to stop omeprazole around 06/05/2022 to make sure he has stopped it in time for the 06/21/2022 repeat H pylori.

## 2022-06-20 ENCOUNTER — Other Ambulatory Visit (INDEPENDENT_AMBULATORY_CARE_PROVIDER_SITE_OTHER): Payer: Self-pay

## 2022-06-20 DIAGNOSIS — B9681 Helicobacter pylori [H. pylori] as the cause of diseases classified elsewhere: Secondary | ICD-10-CM

## 2022-06-21 ENCOUNTER — Other Ambulatory Visit (INDEPENDENT_AMBULATORY_CARE_PROVIDER_SITE_OTHER): Payer: Self-pay | Admitting: *Deleted

## 2022-06-27 LAB — H. PYLORI BREATH TEST: H. pylori Breath Test: NOT DETECTED

## 2022-08-20 ENCOUNTER — Ambulatory Visit (INDEPENDENT_AMBULATORY_CARE_PROVIDER_SITE_OTHER): Payer: Medicare HMO | Admitting: Gastroenterology

## 2022-08-30 ENCOUNTER — Encounter (INDEPENDENT_AMBULATORY_CARE_PROVIDER_SITE_OTHER): Payer: Self-pay | Admitting: Gastroenterology

## 2022-08-30 ENCOUNTER — Ambulatory Visit (INDEPENDENT_AMBULATORY_CARE_PROVIDER_SITE_OTHER): Payer: Medicare HMO | Admitting: Gastroenterology

## 2022-08-30 VITALS — BP 151/81 | HR 61 | Temp 97.8°F | Ht 70.0 in | Wt 144.4 lb

## 2022-08-30 DIAGNOSIS — K5903 Drug induced constipation: Secondary | ICD-10-CM | POA: Diagnosis not present

## 2022-08-30 DIAGNOSIS — B370 Candidal stomatitis: Secondary | ICD-10-CM | POA: Diagnosis not present

## 2022-08-30 MED ORDER — NYSTATIN 100000 UNIT/ML MT SUSP: 5.0000 mL | Freq: Four times a day (QID) | OROMUCOSAL | 0 refills | Status: DC

## 2022-08-30 MED ORDER — OMEPRAZOLE 40 MG PO CPDR: 40.0000 mg | DELAYED_RELEASE_CAPSULE | Freq: Every day | ORAL | 0 refills | Status: DC

## 2022-08-30 NOTE — Progress Notes (Signed)
[name], [name] Gastroenterology & Hepatology [facility] [address] [place], [place] [address]  Primary Care Physician: Carrolyn Meiers, MD [address] [place] [id number]  I will communicate my assessment and recommendations to the referring MD via EMR.  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 pylori status post eradication  History of Present Illness: [name] is a 82 y.o. male with past medical history of GERD, diabetes, Schatzki's ring, hypertension, opioid-induced constipation, esophageal dysmotility, hyperlipidemia and OSA, who presents for follow up of constipation and changes in his tongue.  The patient was last seen on 05/17/2022. At that time, the patient had H. pylori breath test ordered which came back negative.  States he has noticed that for the last year he has noticed his tongue is white in the morning and has a bad taste in his mouth. He has not received any treatment for this.  States that he has felt discomfort frequently due to these although he does not have any tongue or mouth pain.  States he is moving his bowels possibly 2 times a day. Usually this is preceded by some epigastric pain, which resolves when he defecates. He is taking Miralax 3 capfuls per day.  No dysphagia, odynophagia or heartburn, but may have episodes of regurgitation if he eats a big meal.  The patient denies having any nausea, vomiting, fever, chills, hematochezia, melena, hematemesis, abdominal distention, abdominal pain, diarrhea, jaundice, pruritus notably, he has gained 17 pounds since the last time he was seen in our office.  Last EGD: 02/10/2020, esophagus was normal, and empiric dilation via [facility] dilator was performed up to 28 [place], there was no presence of mucosal disruption.   Stomach and small bowel were normal.   Last Colonoscopy: 11/28/2021 The perianal and digital rectal examinations were normal. Six sessile polyps were found in the ascending colon. The polyps were 2 to 6 mm in size. These polyps were removed with a cold snare. Resection and retrieval were complete for 3 polyps, but the other 3 could not be retrieved (fully resected). The ascending colon and cecum appeared normal othwerwise. CT scan findings likely related to stool. Non-bleeding internal hemorrhoids were found during retroflexion. The hemorrhoids were small.  Pathology consistent with tubular adenomas.  Recommended to have a repeat colonoscopy given patient comorbidities.  Past Medical History: Past Medical History:  Diagnosis Date   Arthritis    fingers, back   Chronic back pain    Diabetes mellitus Since 1995   Takes Glucovance and Onglyza daily   Diabetes mellitus without complication ([place])    GERD (gastroesophageal reflux disease)    takes Dexilant and Omeprazole daily   Herpes    History of colon polyps    History of gastric ulcer 25+yrs ago   Hyperlipidemia Since 1995   takes Simvastatin daily   Hypertension Since 1995   takes Metoprolol and Ramipril daily   Hypertension    Pneumonia    hx of;as a child   Sleep apnea    doesn't use CPAP   Urinary frequency    Urinary urgency    takes Flomax daily    Past Surgical History: Past Surgical History:  Procedure Laterality Date   BACK SURGERY  1962   Lumbar   BACK SURGERY     bilateral cataract surgery     BIOPSY  02/23/2022   Procedure: BIOPSY;  Surgeon:  Harvel Quale, MD;  [facility];  Service: Gastroenterology;;   CARDIAC CATHETERIZATION  02/16/10   CERVICAL SPINE SURGERY     COLONOSCOPY     COLONOSCOPY N/A 04/23/2013   Procedure: COLONOSCOPY;  Surgeon: Rogene Houston, MD;  [facility]: [facility];  Service: Endoscopy;  Laterality: N/A;  1030   COLONOSCOPY N/A 07/24/2018   Procedure:  COLONOSCOPY;  Surgeon: Rogene Houston, MD;  [facility];  Service: Endoscopy;  Laterality: N/A;  8:30   COLONOSCOPY WITH PROPOFOL N/A 11/28/2021   Procedure: COLONOSCOPY WITH PROPOFOL;  Surgeon: Harvel Quale, MD;  [facility];  Service: Gastroenterology;  Laterality: N/A;  205   ESOPHAGEAL DILATION N/A 02/10/2020   Procedure: ESOPHAGEAL DILATION;  Surgeon: Rogene Houston, MD;  [facility];  Service: Endoscopy;  Laterality: N/A;   ESOPHAGOGASTRODUODENOSCOPY N/A 12/24/2014   Procedure: ESOPHAGOGASTRODUODENOSCOPY (EGD);  Surgeon: Rogene Houston, MD;  [facility];  Service: Endoscopy;  Laterality: N/A;  2:45   ESOPHAGOGASTRODUODENOSCOPY (EGD) WITH ESOPHAGEAL DILATION     ESOPHAGOGASTRODUODENOSCOPY (EGD) WITH PROPOFOL N/A 02/10/2020   Procedure: ESOPHAGOGASTRODUODENOSCOPY (EGD) WITH PROPOFOL;  Surgeon: Rogene Houston, MD;  [facility];  Service: Endoscopy;  Laterality: N/A;  250   ESOPHAGOGASTRODUODENOSCOPY (EGD) WITH PROPOFOL N/A 02/23/2022   Procedure: ESOPHAGOGASTRODUODENOSCOPY (EGD) WITH PROPOFOL;  Surgeon: Harvel Quale, MD;  [facility];  Service: Gastroenterology;  Laterality: N/A;  1030   HAND SURGERY     Right   LUMBAR LAMINECTOMY/DECOMPRESSION MICRODISCECTOMY N/A 08/29/2012   Procedure: LUMBAR LAMINECTOMY/DECOMPRESSION MICRODISCECTOMY 1 LEVEL;  Surgeon: Floyce Stakes, MD;  Location: [facility];  Service: Neurosurgery;  Laterality: N/A;  Lumbar four-five laminectomies   NECK SURGERY     PENILE PROSTHESIS IMPLANT     PENILE PROSTHESIS IMPLANT N/A 08/07/2021   Procedure: PENILE PROTHESIS INFLATABLE;  Surgeon: Cleon Gustin, MD;  Location: [facility];  Service: Urology;  Laterality: N/A;   POLYPECTOMY  11/28/2021   Procedure: POLYPECTOMY;  Surgeon: Harvel Quale, MD;  [facility];  Service: Gastroenterology;;   REMOVAL OF PENILE PROSTHESIS N/A 08/07/2021   Procedure:  REMOVAL OF PENILE PROSTHESIS;  Surgeon: Cleon Gustin, MD;  Location: [facility];  Service: Urology;  Laterality: N/A;   ROTATOR CUFF REPAIR     Right   SHOULDER SURGERY     TRANSURETHRAL RESECTION OF PROSTATE N/A 05/25/2019   Procedure: TRANSURETHRAL RESECTION OF THE PROSTATE (TURP);  Surgeon: Cleon Gustin, MD;  Location: [facility];  Service: Urology;  Laterality: N/A;   WRIST SURGERY     Left   WRIST SURGERY      Family History: Family History  Problem Relation Age of Onset   Heart attack Father 64       MI   Hypertension Father    Heart attack Brother 31       MI   Diabetes Mother    Heart attack Paternal Uncle     Social History: Social History   Tobacco Use  Smoking Status Never  Smokeless Tobacco Never   Social History   Substance and Sexual Activity  Alcohol Use Not Currently   Comment: occasionally    Social History   Substance and Sexual Activity  Drug Use Yes   Frequency: 3.0 times per week   Types: Marijuana   Comment: last on 11/26/21    Allergies: Allergies  Allergen Reactions   Aspirin Other (See Comments)  Higher dosage upsets stomach   Bayer Aspirin [Aspirin] Other (See Comments)    Stomach "flares up"    Medications: Current Outpatient Medications  Medication Sig Dispense Refill   acyclovir (ZOVIRAX) 200 MG capsule Take 200 mg by mouth as needed.     amLODipine (NORVASC) 10 MG tablet Take 10 mg by mouth daily.     Blood Glucose Monitoring Suppl (ONETOUCH VERIO FLEX SYSTEM) w/Device KIT USE TO TEST TWICE DAILY     buprenorphine (BUTRANS) 15 MCG/HR Place onto the skin once a week.     cetirizine (ZYRTEC) 10 MG tablet Take 10 mg by mouth daily.     diclofenac Sodium (VOLTAREN) 1 % GEL Apply 4 g topically 4 (four) times daily. 150 g 0   dicyclomine (BENTYL) 10 MG capsule Take 1 capsule (10 mg total) by mouth every 12 (twelve) hours as needed (abdominal pain). 60 capsule 2   gabapentin (NEURONTIN) 300 MG capsule Take 300 mg by mouth 2  (two) times daily.     hydrochlorothiazide (HYDRODIURIL) 12.5 MG tablet Take 12.5 mg by mouth daily.     JARDIANCE 25 MG TABS tablet Take 25 mg by mouth daily.     labetalol (NORMODYNE) 200 MG tablet Take 200 mg by mouth daily at 6 (six) AM.     Lancets (ONETOUCH DELICA PLUS 123XX123) MISC USE TO TEST TWICE DAILY     metoprolol succinate (TOPROL-XL) 50 MG 24 hr tablet Take 50 mg by mouth daily. Take with or immediately following a meal.     omeprazole (PRILOSEC) 40 MG capsule Take 1 capsule (40 mg total) by mouth 2 (two) times daily. 60 capsule 0   ONETOUCH VERIO test strip USE TO TEST TWICE DAILY     polyethylene glycol powder (GLYCOLAX) 17 GM/SCOOP powder Take 17 g by mouth daily. (Patient taking differently: Take 17 g by mouth as needed.) 850 g 3   prochlorperazine (COMPAZINE) 10 MG tablet Take 1 tablet (10 mg total) by mouth every 8 (eight) hours for 14 days. 42 tablet 0   ramipril (ALTACE) 10 MG capsule Take 10 mg by mouth daily.      simvastatin (ZOCOR) 20 MG tablet Take 20 mg by mouth daily.     tamsulosin (FLOMAX) 0.4 MG CAPS capsule Take 0.4 mg by mouth daily.     glyBURIDE-metformin (GLUCOVANCE) 5-500 MG per tablet Take 2 tablets by mouth 2 (two) times daily with a meal.  (Patient not taking: Reported on 08/30/2022)     metFORMIN (GLUCOPHAGE) 1000 MG tablet Take 1,000 mg by mouth 2 (two) times daily. (Patient not taking: Reported on 08/30/2022)     No current facility-administered medications for this visit.    Review of Systems: GENERAL: negative for malaise, night sweats HEENT: No changes in hearing or vision, no nose bleeds or other nasal problems. NECK: Negative for lumps, goiter, pain and significant neck swelling RESPIRATORY: Negative for cough, wheezing CARDIOVASCULAR: Negative for chest pain, leg swelling, palpitations, orthopnea GI: SEE HPI MUSCULOSKELETAL: Negative for joint pain or swelling, back pain, and muscle pain. SKIN: Negative for lesions, rash PSYCH: Negative  for sleep disturbance, mood disorder and recent psychosocial stressors. HEMATOLOGY Negative for prolonged bleeding, bruising easily, and swollen nodes. ENDOCRINE: Negative for cold or heat intolerance, polyuria, polydipsia and goiter. NEURO: negative for tremor, gait imbalance, syncope and seizures. The remainder of the review of systems is noncontributory.   Physical Exam: BP (!) 151/81 (BP Location: Left Arm, Patient Position: Sitting, Cuff Size: Small)  Pulse 61   Temp 97.8 F (36.6 C) (Temporal)   Ht 5' 10"$  (1.778 m)   Wt 144 lb 6.4 oz (65.5 kg)   BMI 20.72 kg/m  GENERAL: The patient is AO x3, in no acute distress. HEENT: Head is normocephalic and atraumatic. EOMI are intact. Mouth is well hydrated and without lesions. No whitish coating or secretions NECK: Supple. No masses LUNGS: Clear to auscultation. No presence of rhonchi/wheezing/rales. Adequate chest expansion HEART: RRR, normal s1 and s2. ABDOMEN: Soft, nontender, no guarding, no peritoneal signs, and nondistended. BS +. No masses. EXTREMITIES: Without any cyanosis, clubbing, rash, lesions or edema. NEUROLOGIC: AOx3, no focal motor deficit. SKIN: no jaundice, no rashes  Imaging/Labs: as above  I personally reviewed and interpreted the available labs, imaging and endoscopic files.  Impression and Plan: JANARI MANDELLA is a 82 y.o. male with past medical history of GERD, diabetes, Schatzki's ring, hypertension, opioid-induced constipation, esophageal dysmotility, hyperlipidemia and OSA, who presents for follow up of constipation and changes in his tongue.  Patient has presented adequate improvement of his constipation by using starting MiraLAX.  Seems to have some abdominal pain preceding his bowel movements which is likely related to bowel hypersensitivity in the setting of chronic opiate use.  However, his abdominal pain is very mild and not concerning for him.  In terms of his pulm changes, he has not presented  significant mouth pain and does not have any presence of exudates or inflammatory changes during the physical exam today.  However, he reports that he removed his tongue secretions in the morning.  For now, I advised him to take a course of topical nystatin.  May also decrease his omeprazole to once a day dosing for now, which she is agreeable to proceed with.  The patient was found to have elevated blood pressure when vital signs were checked in the office. The blood pressure was rechecked by the nursing staff and it was found be persistently elevated >140/90 mmHg. I personally advised to the patient to follow up closely with PCP for hypertension control.  - Use nystatin mouth wash  - Decrease omeprazole to 40 mg qday - Continue Miralax daily, uptitrate if needed to achieve regular bowel movements   All questions were answered.      Barry Peppers, MD Gastroenterology and Hepatology [facility]

## 2022-08-30 NOTE — Patient Instructions (Addendum)
Use nystatin mouth wash  Decrease omeprazole to 40 mg qday Continue Miralax The patient was found to have elevated blood pressure when vital signs were checked in the office. The blood pressure was rechecked by the nursing staff and it was found be persistently elevated >140/90 mmHg. I personally advised to the patient to follow up closely with PCP for hypertension control.

## 2022-08-31 ENCOUNTER — Other Ambulatory Visit (INDEPENDENT_AMBULATORY_CARE_PROVIDER_SITE_OTHER): Payer: Self-pay | Admitting: *Deleted

## 2022-08-31 MED ORDER — OMEPRAZOLE 40 MG PO CPDR: 40.0000 mg | DELAYED_RELEASE_CAPSULE | Freq: Every day | ORAL | 0 refills | Status: DC

## 2022-10-06 ENCOUNTER — Other Ambulatory Visit (INDEPENDENT_AMBULATORY_CARE_PROVIDER_SITE_OTHER): Payer: Self-pay | Admitting: Gastroenterology

## 2022-11-08 ENCOUNTER — Other Ambulatory Visit (INDEPENDENT_AMBULATORY_CARE_PROVIDER_SITE_OTHER): Payer: Self-pay | Admitting: *Deleted

## 2022-11-08 ENCOUNTER — Encounter (INDEPENDENT_AMBULATORY_CARE_PROVIDER_SITE_OTHER): Payer: Self-pay | Admitting: Gastroenterology

## 2022-11-08 ENCOUNTER — Ambulatory Visit (INDEPENDENT_AMBULATORY_CARE_PROVIDER_SITE_OTHER): Payer: Medicare HMO | Admitting: Gastroenterology

## 2022-11-08 VITALS — BP 109/69 | HR 61 | Temp 97.6°F | Ht 70.0 in | Wt 133.9 lb

## 2022-11-08 DIAGNOSIS — R634 Abnormal weight loss: Secondary | ICD-10-CM

## 2022-11-08 DIAGNOSIS — R1310 Dysphagia, unspecified: Secondary | ICD-10-CM | POA: Diagnosis not present

## 2022-11-08 DIAGNOSIS — B370 Candidal stomatitis: Secondary | ICD-10-CM

## 2022-11-08 DIAGNOSIS — R1033 Periumbilical pain: Secondary | ICD-10-CM | POA: Diagnosis not present

## 2022-11-08 MED ORDER — NYSTATIN 100000 UNIT/ML MT SUSP: 5.0000 mL | Freq: Four times a day (QID) | OROMUCOSAL | 0 refills | Status: DC

## 2022-11-08 MED ORDER — NYSTATIN 100000 UNIT/ML MT SUSP
5.0000 mL | Freq: Four times a day (QID) | OROMUCOSAL | 0 refills | Status: DC
Start: 2022-11-08 — End: 2023-01-01

## 2022-11-08 NOTE — Patient Instructions (Signed)
I am sending the nystatin mouthwash for you to use again 4 times per day, please use this until completion We will get you scheduled for EGD for further evaluation of your symptoms Continue omeprazole 40mg  daily  Follow up 3 months

## 2022-11-08 NOTE — Progress Notes (Addendum)
Referring Provider: Benetta Spar* Primary Care Physician:  Benetta Spar, MD Primary GI Physician: Levon Hedger   Chief Complaint  Patient presents with   Abdominal Pain    Having pain in mid abdomen. States he feels like h pylori came back. Has a white film on tongue in the mornings.    HPI:   [name] is a 82 y.o. male with past medical history of GERD, diabetes, Schatzki's ring, hypertension, opioid-induced constipation, esophageal dysmotility, hyperlipidemia and OSA   Patient presenting today for abdominal pain and changes in tongue  Last seen February 2024, at that time noticed his tongue is white in the morning and has a bad taste in his mouth. He has not received any treatment for this.  States that he has felt discomfort frequently due to these although he does not have any tongue or mouth pain. moving his bowels possibly 2 times a day. Usually this is preceded by some epigastric pain, which resolves when he defecates. He is taking Miralax 3 capfuls per day.  Recommended to use nystatin mouth wash, decrease omeprazole to 40mg  daily, continue miralax daily.   Present:  With using the mouthwash he was given at last visit, felt symptoms got better initially then he stopped using it at the end of march prior to completion of entire therapy, recently started noticing white coating on his tongue again a couple of weeks ago. Has had some raw feeling down his esophagus at times but no dysphagia.  He notes waking up with periumbilical pain for the past month. No precipitating factors. No nausea or vomiting. Eating makes abdominal pain some better. He had some acid reflux symptoms a few times a couple of weeks ago with some acid regurgitation but no ongoing breakthrough symptoms. He is taking omeprazole 40mg  once daily. Feels some bloating at times and has to drink soda water to help him belch which makes him feel better. He is not taking any NSAIDs   Weight has  fluctuated over the past year but has had 11 pounds of weight loss since his last OV.   Has occasional constipation but takes miralax with good results. He is taking 2t daily with good results. Having a BM 1-2x/day. No rectal bleeding or melena.    Last EGD: 02/2022 esophagus was normal, and empiric dilation via [facility] dilator was performed up to [address] [place], there was no presence of mucosal disruption.  Stomach and small bowel were normal. Last Colonoscopy: 11/28/2021 The perianal and digital rectal examinations were normal. Six sessile polyps were found in the ascending colon. The polyps were 2 to 6 mm in size. These polyps were removed with a cold snare. Resection and retrieval were complete for 3 polyps, but the other 3 could not be retrieved (fully resected). The ascending colon and cecum appeared normal othwerwise. CT scan findings likely related to stool. Non-bleeding internal hemorrhoids were found during retroflexion. The hemorrhoids were small.   Pathology consistent with tubular adenomas.   Recommended to have a repeat colonoscopy given patient comorbidities.    Past Medical History:  Diagnosis Date   Arthritis    fingers, back   Chronic back pain    Diabetes mellitus Since 1995   Takes Glucovance and Onglyza daily   Diabetes mellitus without complication (HCC)    GERD (gastroesophageal reflux disease)    takes Dexilant and Omeprazole daily   Herpes    History of colon polyps    History of gastric ulcer 25+yrs ago  Hyperlipidemia Since 1995   takes Simvastatin daily   Hypertension Since 1995   takes Metoprolol and Ramipril daily   Hypertension    Pneumonia    hx of;as a child   Sleep apnea    doesn't use CPAP   Urinary frequency    Urinary urgency    takes Flomax daily    Past Surgical History:  Procedure Laterality Date   BACK SURGERY  1962   Lumbar   BACK SURGERY     bilateral cataract surgery     BIOPSY  02/23/2022   Procedure: BIOPSY;  Surgeon:  Dolores Frame, MD;  [facility];  Service: Gastroenterology;;   CARDIAC CATHETERIZATION  02/16/10   CERVICAL SPINE SURGERY     COLONOSCOPY     COLONOSCOPY N/A 04/23/2013   Procedure: COLONOSCOPY;  Surgeon: Malissa Hippo, MD;  Location: [facility];  Service: Endoscopy;  Laterality: N/A;  1030   COLONOSCOPY N/A 07/24/2018   Procedure: COLONOSCOPY;  Surgeon: Malissa Hippo, MD;  [facility]: [facility];  Service: Endoscopy;  Laterality: N/A;  8:30   COLONOSCOPY WITH PROPOFOL N/A 11/28/2021   Procedure: COLONOSCOPY WITH PROPOFOL;  Surgeon: Dolores Frame, MD;  [facility]: [facility];  Service: Gastroenterology;  Laterality: N/A;  205   ESOPHAGEAL DILATION N/A 02/10/2020   Procedure: ESOPHAGEAL DILATION;  Surgeon: Malissa Hippo, MD;  [facility];  Service: Endoscopy;  Laterality: N/A;   ESOPHAGOGASTRODUODENOSCOPY N/A 12/24/2014   Procedure: ESOPHAGOGASTRODUODENOSCOPY (EGD);  Surgeon: Malissa Hippo, MD;  [facility];  Service: Endoscopy;  Laterality: N/A;  2:45   ESOPHAGOGASTRODUODENOSCOPY (EGD) WITH ESOPHAGEAL DILATION     ESOPHAGOGASTRODUODENOSCOPY (EGD) WITH PROPOFOL N/A 02/10/2020   Procedure: ESOPHAGOGASTRODUODENOSCOPY (EGD) WITH PROPOFOL;  Surgeon: Malissa Hippo, MD;  [facility];  Service: Endoscopy;  Laterality: N/A;  250   ESOPHAGOGASTRODUODENOSCOPY (EGD) WITH PROPOFOL N/A 02/23/2022   Procedure: ESOPHAGOGASTRODUODENOSCOPY (EGD) WITH PROPOFOL;  Surgeon: Dolores Frame, MD;  [facility];  Service: Gastroenterology;  Laterality: N/A;  1030   HAND SURGERY     Right   LUMBAR LAMINECTOMY/DECOMPRESSION MICRODISCECTOMY N/A 08/29/2012   Procedure: LUMBAR LAMINECTOMY/DECOMPRESSION MICRODISCECTOMY 1 LEVEL;  Surgeon: Karn Cassis, MD;  [facility]: [facility];  Service: Neurosurgery;  Laterality: N/A;  Lumbar four-five laminectomies   NECK SURGERY     PENILE PROSTHESIS IMPLANT     PENILE PROSTHESIS  IMPLANT N/A 08/07/2021   Procedure: PENILE PROTHESIS INFLATABLE;  Surgeon: Malen Gauze, MD;  Location: [facility];  Service: Urology;  Laterality: N/A;   POLYPECTOMY  11/28/2021   Procedure: POLYPECTOMY;  Surgeon: Dolores Frame, MD;  [facility];  Service: Gastroenterology;;   REMOVAL OF PENILE PROSTHESIS N/A 08/07/2021   Procedure: REMOVAL OF PENILE PROSTHESIS;  Surgeon: Malen Gauze, MD;  Location: [facility];  Service: Urology;  Laterality: N/A;   ROTATOR CUFF REPAIR     Right   SHOULDER SURGERY     TRANSURETHRAL RESECTION OF PROSTATE N/A 05/25/2019   Procedure: TRANSURETHRAL RESECTION OF THE PROSTATE (TURP);  Surgeon: Malen Gauze, MD;  Location: [facility];  Service: Urology;  Laterality: N/A;   WRIST SURGERY     Left   WRIST SURGERY      Current Outpatient Medications  Medication Sig Dispense Refill   acyclovir (ZOVIRAX) 200 MG capsule Take 200 mg by mouth as needed.     amLODipine (NORVASC) 10 MG tablet Take 10 mg by mouth  daily.     Blood Glucose Monitoring Suppl (ONETOUCH VERIO FLEX SYSTEM) w/Device KIT USE TO TEST TWICE DAILY     buprenorphine (BUTRANS) 15 MCG/HR Place onto the skin once a week.     cetirizine (ZYRTEC) 10 MG tablet Take 10 mg by mouth daily.     diclofenac Sodium (VOLTAREN) 1 % GEL Apply 4 g topically 4 (four) times daily. 150 g 0   dicyclomine (BENTYL) 10 MG capsule Take 1 capsule (10 mg total) by mouth every 12 (twelve) hours as needed (abdominal pain). 60 capsule 2   gabapentin (NEURONTIN) 300 MG capsule Take 300 mg by mouth 2 (two) times daily.     glyBURIDE-metformin (GLUCOVANCE) 5-500 MG per tablet Take 2 tablets by mouth 2 (two) times daily with a meal.     hydrochlorothiazide (HYDRODIURIL) 12.5 MG tablet Take 12.5 mg by mouth daily.     JARDIANCE 25 MG TABS tablet Take 25 mg by mouth daily.     labetalol (NORMODYNE) 200 MG tablet Take 200 mg by mouth daily at 6 (six) AM.     Lancets (ONETOUCH DELICA PLUS LANCET33G) MISC  USE TO TEST TWICE DAILY     metoprolol succinate (TOPROL-XL) 50 MG 24 hr tablet Take 50 mg by mouth daily. Take with or immediately following a meal.     nystatin (MYCOSTATIN) 100000 UNIT/ML suspension Take 5 mLs (500,000 Units total) by mouth 4 (four) times daily. 240 mL 0   omeprazole (PRILOSEC) 40 MG capsule TAKE 1 CAPSULE(40 MG) BY MOUTH DAILY 30 capsule 0   ONETOUCH VERIO test strip USE TO TEST TWICE DAILY     polyethylene glycol powder (GLYCOLAX) 17 GM/SCOOP powder Take 17 g by mouth daily. (Patient taking differently: Take 17 g by mouth as needed.) 850 g 3   prochlorperazine (COMPAZINE) 10 MG tablet Take 1 tablet (10 mg total) by mouth every 8 (eight) hours for 14 days. 42 tablet 0   ramipril (ALTACE) 10 MG capsule Take 10 mg by mouth daily.      simvastatin (ZOCOR) 20 MG tablet Take 20 mg by mouth daily.     tamsulosin (FLOMAX) 0.4 MG CAPS capsule Take 0.4 mg by mouth daily.     No current facility-administered medications for this visit.    Allergies as of 11/08/2022 - Review Complete 11/08/2022  Allergen Reaction Noted   Aspirin Other (See Comments)    Bayer aspirin [aspirin] Other (See Comments) 06/20/2013    Family History  Problem Relation Age of Onset   Heart attack Father 74       MI   Hypertension Father    Heart attack Brother 8       MI   Diabetes Mother    Heart attack Paternal Uncle     Social History   Socioeconomic History   Marital status: Single    Spouse name: Not on file   Number of children: 8   Years of education: Not on file   Highest education level: Not on file  Occupational History   Occupation: Human resources officer company    Employer: RETIRED    Comment: Retired  Tobacco Use   Smoking status: Never   Smokeless tobacco: Never  Building services engineer Use: Never used  Substance and Sexual Activity   Alcohol use: Not Currently    Comment: occasionally    Drug use: Yes    Frequency: 3.0 times per week    Types: Marijuana    Comment: last on  11/26/21   Sexual activity: Never  Other Topics Concern   Not on file  Social History Narrative   ** Merged History Encounter **       Social Determinants of Health   Financial Resource Strain: Not on file  Food Insecurity: Not on file  Transportation Needs: Not on file  Physical Activity: Not on file  Stress: Not on file  Social Connections: Not on file    Review of systems General: negative for malaise, night sweats, fever, chills, +weight loss  Neck: Negative for lumps, goiter, pain and significant neck swelling Resp: Negative for cough, wheezing, dyspnea at rest CV: Negative for chest pain, leg swelling, palpitations, orthopnea GI: denies melena, hematochezia, nausea, vomiting, diarrhea, constipation, dysphagia, odyonophagia, early satiety or unintentional weight loss. +odynophagia +white coating to tongue +abdominal/epigastric pain MSK: Negative for joint pain or swelling, back pain, and muscle pain. Derm: Negative for itching or rash Psych: Denies depression, anxiety, memory loss, confusion. No homicidal or suicidal ideation.  Heme: Negative for prolonged bleeding, bruising easily, and swollen nodes. Endocrine: Negative for cold or heat intolerance, polyuria, polydipsia and goiter. Neuro: negative for tremor, gait imbalance, syncope and seizures. The remainder of the review of systems is noncontributory.  Physical Exam: BP 109/69 (BP Location: Left Arm, Patient Position: Sitting, Cuff Size: Normal)   Pulse 61   Temp 97.6 F (36.4 C) (Oral)   Ht 5\' 10"  (1.778 m)   Wt 133 lb 14.4 oz (60.7 kg)   BMI 19.21 kg/m  General:   Alert and oriented. No distress noted. Pleasant and cooperative.  Head:  Normocephalic and atraumatic. Eyes:  Conjuctiva clear without scleral icterus. Mouth:  white coating noted to tongue, concerning for candida  Heart: Normal rate and rhythm, s1 and s2 heart sounds present.  Lungs: Clear lung sounds in all lobes. Respirations equal and  unlabored. Abdomen:  +BS, soft, non-tender and non-distended. No rebound or guarding. No HSM or masses noted. Derm: No palmar erythema or jaundice Msk:  Symmetrical without gross deformities. Normal posture. Extremities:  Without edema. Neurologic:  Alert and  oriented x4 Psych:  Alert and cooperative. Normal mood and affect.  Invalid input(s): "6 MONTHS"   ASSESSMENT: JOSSIAH BYAS is a 82 y.o. male presenting today for epigastric pain, white coating on tongue, odynophagia and weight loss.   History of H pylori, last treated in November with confirmed eradication in December 2023, now noting recurrent mid abdominal/epigastric pain, sometimes improved with eating, he has lost 11 pound since his visit in February. He is also having recurrent candida with white coating on his tongue though did not complete entire nystatin course as prescribed in February, noting some "rawness" in his esophagus and odynophagia at times. No dysphagia. Symptoms certainly concerning for recurrence of H pylor/superimposed candida esophagitis. Would recommend repeat EGD for further evaluation. Will repeat course of nystatin, he needs to complete the entire course, continue omeprazole 40mg  daily.  Indications, risks and benefits of procedure discussed in detail with patient. Patient verbalized understanding and is in agreement to proceed with EGD   PLAN:  Nystatin mouthwash 5ml QID  2.  Continue omeprazole 40mg  daily  3. Repeat EGD ASA III   All questions were answered, patient verbalized understanding and is in agreement with plan as outlined above.   Follow Up: 3 months   Estle Sabella L. Jeanmarie Hubert, MSN, APRN, AGNP-C Adult-Gerontology Nurse Practitioner [facility]  I have reviewed the note and agree with the APP's assessment as described  in this progress note  [name], MD Gastroenterology and Hepatology [facility] Gastroenterology

## 2022-11-09 ENCOUNTER — Other Ambulatory Visit (INDEPENDENT_AMBULATORY_CARE_PROVIDER_SITE_OTHER): Payer: Self-pay | Admitting: Gastroenterology

## 2022-11-09 ENCOUNTER — Telehealth (INDEPENDENT_AMBULATORY_CARE_PROVIDER_SITE_OTHER): Payer: Self-pay | Admitting: Gastroenterology

## 2022-11-09 NOTE — Telephone Encounter (Signed)
Pt in office yesterday and needing EGD scheduled. Pt contacted and EGD scheduled. Instructions will be mailed to patient.  Will call patient with pre op appt.

## 2022-11-20 ENCOUNTER — Ambulatory Visit (HOSPITAL_COMMUNITY)
Admission: RE | Admit: 2022-11-20 | Discharge: 2022-11-20 | Disposition: A | Discharge status: Home / Self Care | Payer: Medicare HMO | Source: Ambulatory Visit | Attending: Gerontology | Admitting: Gerontology

## 2022-11-20 ENCOUNTER — Other Ambulatory Visit (HOSPITAL_COMMUNITY): Payer: Self-pay | Admitting: Gerontology

## 2022-11-20 DIAGNOSIS — R059 Cough, unspecified: Secondary | ICD-10-CM | POA: Diagnosis present

## 2022-11-21 ENCOUNTER — Encounter (INDEPENDENT_AMBULATORY_CARE_PROVIDER_SITE_OTHER): Payer: Self-pay

## 2022-12-02 ENCOUNTER — Other Ambulatory Visit: Payer: Self-pay

## 2022-12-02 ENCOUNTER — Emergency Department (HOSPITAL_COMMUNITY)
Admission: EM | Admit: 2022-12-02 | Discharge: 2022-12-02 | Disposition: A | Discharge status: Home / Self Care | Payer: Medicare HMO | Attending: Emergency Medicine | Admitting: Emergency Medicine

## 2022-12-02 ENCOUNTER — Encounter (HOSPITAL_COMMUNITY): Payer: Self-pay | Admitting: Emergency Medicine

## 2022-12-02 ENCOUNTER — Emergency Department (HOSPITAL_COMMUNITY): Payer: Medicare HMO

## 2022-12-02 DIAGNOSIS — M79642 Pain in left hand: Secondary | ICD-10-CM | POA: Diagnosis not present

## 2022-12-02 DIAGNOSIS — Z7984 Long term (current) use of oral hypoglycemic drugs: Secondary | ICD-10-CM | POA: Insufficient documentation

## 2022-12-02 DIAGNOSIS — E119 Type 2 diabetes mellitus without complications: Secondary | ICD-10-CM | POA: Insufficient documentation

## 2022-12-02 DIAGNOSIS — M25532 Pain in left wrist: Secondary | ICD-10-CM | POA: Diagnosis not present

## 2022-12-02 DIAGNOSIS — I10 Essential (primary) hypertension: Secondary | ICD-10-CM | POA: Diagnosis not present

## 2022-12-02 DIAGNOSIS — Z79899 Other long term (current) drug therapy: Secondary | ICD-10-CM | POA: Diagnosis not present

## 2022-12-02 MED ORDER — ACETAMINOPHEN 325 MG PO TABS
650.0000 mg | ORAL_TABLET | Freq: Once | ORAL | Status: AC
  Administered 2022-12-02: 650 mg via ORAL
  Filled 2022-12-02: qty 2

## 2022-12-02 NOTE — ED Provider Notes (Signed)
[place] EMERGENCY DEPARTMENT AT [facility] Provider Note   CSN: [id number] Arrival date & time: 12/02/22  1244     History  Chief Complaint  Patient presents with   Hand Pain    [name] is a 82 y.o. male history of diabetes, arthritis, hypertension, cervical radiculopathy presented with 2 days of left wrist pain.  Patient states that the pain is on the back of his wrist with mild swelling.  Patient states that he is noticed weakness in his left wrist but is able to move his fingers and wrist.  Patient has history of carpal tunnel repair in no other surgeries in that wrist.  Patient tried gabapentin and a muscle relaxer at home to no relief.  Patient denied recent surgeries, loss sensation, skin color changes, fevers, tenderness in his hand, recent falls/trauma, pain at his elbow, obvious deformity, nausea/vomiting, recent illness, IVDU  Home Medications Prior to Admission medications   Medication Sig Start Date End Date Taking? Authorizing Provider  acyclovir (ZOVIRAX) 200 MG capsule Take 200 mg by mouth as needed. 01/07/14   [provider]  amLODipine (NORVASC) 10 MG tablet Take 10 mg by mouth daily.    [provider]  Blood Glucose Monitoring Suppl (ONETOUCH VERIO FLEX SYSTEM) w/Device KIT USE TO TEST TWICE DAILY 09/30/19   [provider]  buprenorphine (BUTRANS) 15 MCG/HR Place onto the skin once a week.    [provider]  cetirizine (ZYRTEC) 10 MG tablet Take 10 mg by mouth daily. 01/12/20   [provider]  diclofenac Sodium (VOLTAREN) 1 % GEL Apply 4 g topically 4 (four) times daily. 04/11/22   Clark, Meghan R, PA-C  dicyclomine (BENTYL) 10 MG capsule Take 1 capsule (10 mg total) by mouth every 12 (twelve) hours as needed (abdominal pain). 01/15/22   Dolores Frame, MD  gabapentin (NEURONTIN) 300 MG capsule Take 300 mg by mouth 2 (two) times daily. 12/29/21   [provider]  glyBURIDE-metformin  (GLUCOVANCE) 5-500 MG per tablet Take 2 tablets by mouth 2 (two) times daily with a meal.    [provider]  hydrochlorothiazide (HYDRODIURIL) 12.5 MG tablet Take 12.5 mg by mouth daily. 07/20/21   [provider]  JARDIANCE 25 MG TABS tablet Take 25 mg by mouth daily. 08/11/21   [provider]  labetalol (NORMODYNE) 200 MG tablet Take 200 mg by mouth daily at 6 (six) AM.    [provider]  Lancets (ONETOUCH DELICA PLUS LANCET33G) MISC USE TO TEST TWICE DAILY 09/30/19   [provider]  metoprolol succinate (TOPROL-XL) 50 MG 24 hr tablet Take 50 mg by mouth daily. Take with or immediately following a meal.    [provider]  nystatin (MYCOSTATIN) 100000 UNIT/ML suspension Use as directed 5 mLs (500,000 Units total) in the mouth or throat 4 (four) times daily. Swish and swallow 11/08/22   Carlan, Chelsea L, NP  omeprazole (PRILOSEC) 40 MG capsule TAKE 1 CAPSULE(40 MG) BY MOUTH DAILY 11/09/22   Carlan, Jeral Pinch, NP  ONETOUCH VERIO test strip USE TO TEST TWICE DAILY 09/30/19   [provider]  polyethylene glycol powder (GLYCOLAX) 17 GM/SCOOP powder Take 17 g by mouth daily. Patient taking differently: Take 17 g by mouth as needed. 04/17/21   Dolores Frame, MD  prochlorperazine (COMPAZINE) 10 MG tablet Take 1 tablet (10 mg total) by mouth every 8 (eight) hours for 14 days. 03/02/22 11/08/22  Dolores Frame, MD  ramipril (  ALTACE) 10 MG capsule Take 10 mg by mouth daily.  02/05/14   [provider]  simvastatin (ZOCOR) 20 MG tablet Take 20 mg by mouth daily. 04/15/13   [provider]  tamsulosin (FLOMAX) 0.4 MG CAPS capsule Take 0.4 mg by mouth daily. 06/13/21   [provider]  bismuth-metronidazole-tetracycline (PLYERA) 140-125-125 MG per capsule Take 3 capsules by mouth 4 (four) times daily -  before meals and at bedtime.  09/26/11  [provider]      Allergies    Aspirin and Bayer  aspirin [aspirin]    Review of Systems   Review of Systems See HPI Physical Exam Updated Vital Signs BP (!) 140/74 (BP Location: Right Arm)   Pulse 93   Temp 99.2 F (37.3 C) (Oral)   Resp 18   Ht 5\' 10"  (1.778 m)   Wt 60.8 kg   SpO2 96%   BMI 19.23 kg/m  Physical Exam Constitutional:      General: He is not in acute distress.    Appearance: He is not ill-appearing, toxic-appearing or diaphoretic.  Cardiovascular:     Rate and Rhythm: Normal rate and regular rhythm.     Pulses: Normal pulses.     Heart sounds: Normal heart sounds.     Comments: 2+ bilateral radial pulses with regular rate Musculoskeletal:     Comments: Opening and closing his hand when I entered the room Full active range of motion in bilateral wrists Mild edema noted left wrist without crepitus/step-off/abnormalities palpated Arm does not appear edematous Soft compartments No flexor tendon tenderness Able to passively extend fingers without pain  Skin:    General: Skin is warm and dry.     Capillary Refill: Capillary refill takes less than 2 seconds.     Comments: Mild swelling noted to left wrist however no skin color changes and not warm to the touch No obvious skin injury in hand or wrist No areas of fluctuance  Neurological:     Mental Status: He is alert.     Comments: Sensation intact distally     ED Results / Procedures / Treatments   Labs (all labs ordered are listed, but only abnormal results are displayed) Labs Reviewed - No data to display  EKG None  Radiology DG Wrist Complete Left  Result Date: 12/02/2022 CLINICAL DATA:  pain EXAM: LEFT WRIST - COMPLETE 3+ VIEW COMPARISON:  None Available. FINDINGS: Distal radial shaft fixation hardware partially visualized. Carpal rows intact. No fracture or dislocation. DJD at the STT, CMC, and first MCP joints. Scattered arterial calcifications. IMPRESSION: Degenerative changes as above. No acute findings. Electronically Signed   By: Corlis Leak [name]   On: 12/02/2022 13:35   DG Hand Complete Left  Result Date: 12/02/2022 CLINICAL DATA:  pain EXAM: LEFT HAND - COMPLETE 3+ VIEW COMPARISON:  None Available. FINDINGS: Plate and screw fixation of distal radial shaft partially visualized. Normal alignment and mineralization. No fracture or dislocation. Mild DJD at the first uGym2 DAP and CMC joints. Scattered arterial calcifications. Regional soft tissues unremarkable. IMPRESSION: 1. No acute findings. 2. Mild DJD. Electronically Signed   By: Corlis Leak [name]   On: 12/02/2022 13:34    Procedures Procedures    Medications Ordered in ED Medications  acetaminophen (TYLENOL) tablet 650 mg (650 mg Oral Given 12/02/22 1354)    ED Course/ Medical Decision Making/ A&P  Medical Decision Making Amount and/or Complexity of Data Reviewed Radiology: ordered.  Risk OTC drugs.   [name] 82 y.o. presented today for left wrist pain. Working DDx that I considered at this time includes, but not limited to, arthritis, flexor tenosynovitis, septic arthritis, fracture, dislocation, neurovascular compromise, ischemic limb, compartment syndrome.  R/o DDx: flexor tenosynovitis, septic arthritis, fracture, dislocation, neurovascular compromise, ischemic limb, compartment syndrome: These are considered less likely due to history of present illness and physical exam findings  Review of prior external notes: 11/12/2022 office visit  Unique Tests and My Interpretation:  Left wrist x-ray: Degenerative changes Left hand x-ray: Mild DJD  Discussion with Independent Historian:  Family member  Discussion of Management of Tests: None  Risk: Medium: prescription drug management  Risk Stratification Score: none  Plan: Patient presented for left wrist pain. On exam patient was in no acute distress and stable vitals.  When I walked in the room patient was opening closing his hand without difficulty.  Patient good  pulses motor sensation distally.  Patient did have some mild swelling noted in his left wrist however was not warm to the touch and did not have any skin color changes.  Patient denied any recent infections, trauma, surgeries, history of gout, fevers, nausea/vomiting, change in sensation/motor skills.  Patient has been afebrile throughout ED course.  Patient does know he has history of arthritis and has not tried any anti-inflammatories at this time.  Patient's x-ray was negative.  Highly suspect patient has had an exacerbation of his arthritis and will give patient Tylenol and wrist splint with primary care follow-up.  Patient was given return precautions. Patient stable for discharge at this time.  Patient verbalized understanding of plan.         Final Clinical Impression(s) / ED Diagnoses Final diagnoses:  Left hand pain    Rx / DC Orders ED Discharge Orders     None         Remi Deter 12/02/22 1856    Terrilee Files, MD 12/02/22 [phone]

## 2022-12-02 NOTE — Discharge Instructions (Addendum)
Please follow-up with primary care provider regarding recent symptoms and ER visit.  Today your x-rays were reassuring but did show signs of arthritis which is most likely causing your hand pain.  Please continue Tylenol every 6 hours as needed for pain and ice your hand as well.  You may use the wrist splint that we gave you today in the ER and please keep this on at all times unless taking a shower.  If symptoms worsen please return to ER.

## 2022-12-02 NOTE — ED Triage Notes (Signed)
Pt c/o left hand/wrist pain x 2 days with moderate swelling noted to left posterior wrist. Pt states he has been unable to use his hand since onset of symptoms. No known injury, no prior hx gout. A/O and ambulatory on arrival

## 2022-12-25 ENCOUNTER — Other Ambulatory Visit: Payer: Self-pay

## 2022-12-25 ENCOUNTER — Emergency Department (HOSPITAL_COMMUNITY)
Admission: EM | Admit: 2022-12-25 | Discharge: 2022-12-25 | Disposition: A | Discharge status: Home / Self Care | Payer: Medicare HMO | Attending: Emergency Medicine | Admitting: Emergency Medicine

## 2022-12-25 DIAGNOSIS — R1013 Epigastric pain: Secondary | ICD-10-CM | POA: Diagnosis present

## 2022-12-25 LAB — COMPREHENSIVE METABOLIC PANEL
ALT: 21 U/L (ref 0–44)
AST: 18 U/L (ref 15–41)
Albumin: 3.4 g/dL — ABNORMAL LOW (ref 3.5–5.0)
Alkaline Phosphatase: 46 U/L (ref 38–126)
Anion gap: 8 (ref 5–15)
BUN: 14 mg/dL (ref 8–23)
CO2: 29 mmol/L (ref 22–32)
Calcium: 9.2 mg/dL (ref 8.9–10.3)
Chloride: 99 mmol/L (ref 98–111)
Creatinine, Ser: 0.9 mg/dL (ref 0.61–1.24)
GFR, Estimated: >60 mL/min (ref >60)
Glucose, Bld: 241 mg/dL — ABNORMAL HIGH (ref 70–99)
Potassium: 4.1 mmol/L (ref 3.5–5.1)
Sodium: 136 mmol/L (ref 135–145)
Total Bilirubin: 0.6 mg/dL (ref 0.3–1.2)
Total Protein: 7.5 g/dL (ref 6.5–8.1)

## 2022-12-25 LAB — URINALYSIS, ROUTINE W REFLEX MICROSCOPIC
Bacteria, UA: NONE SEEN
Bilirubin Urine: NEGATIVE
Glucose, UA: 150 mg/dL — AB
Ketones, ur: NEGATIVE mg/dL
Leukocytes,Ua: NEGATIVE
Nitrite: NEGATIVE
Protein, ur: NEGATIVE mg/dL
Specific Gravity, Urine: 1.011 (ref 1.005–1.030)
pH: 5 (ref 5.0–8.0)

## 2022-12-25 LAB — CBC
HCT: 33.7 % — ABNORMAL LOW (ref 39.0–52.0)
Hemoglobin: 10.9 g/dL — ABNORMAL LOW (ref 13.0–17.0)
MCH: 30.2 pg (ref 26.0–34.0)
MCHC: 32.3 g/dL (ref 30.0–36.0)
MCV: 93.4 fL (ref 80.0–100.0)
Platelets: 209 10*3/uL (ref 150–400)
RBC: 3.61 MIL/uL — ABNORMAL LOW (ref 4.22–5.81)
RDW: 13.2 % (ref 11.5–15.5)
WBC: 3.2 10*3/uL — ABNORMAL LOW (ref 4.0–10.5)
nRBC: 0 % (ref 0.0–0.2)

## 2022-12-25 LAB — LIPASE, BLOOD: Lipase: 30 U/L (ref 11–51)

## 2022-12-25 NOTE — ED Triage Notes (Signed)
Pt c/o abdominal pain x2 days pain has now radiated to right flank.

## 2022-12-25 NOTE — ED Provider Notes (Signed)
[name]  Provider Note  CSN: [id number] Arrival date & time: 12/25/22 0047  History Chief Complaint  Patient presents with   Abdominal Pain    [name] is a 82 y.o. male presents for evaluation of abdominal pain. He has been followed by GI for epigastric pain, odynophagia and possible thrush since Feb. Scheduled for EGD next week. He reports abdominal pain worsened on 6/16 and again last night prompting his ED visit. He took some Pepto and reports his symptoms have since resolved. He reports having white film on his tongue and pain with swallowing He has done two courses of nystatin since February. Currently he is asymptomatic.    Home Medications Prior to Admission medications   Medication Sig Start Date End Date Taking? Authorizing Provider  acyclovir (ZOVIRAX) 200 MG capsule Take 200 mg by mouth as needed. 01/07/14   [provider]  amLODipine (NORVASC) 10 MG tablet Take 10 mg by mouth daily.    [provider]  Blood Glucose Monitoring Suppl (ONETOUCH VERIO FLEX SYSTEM) w/Device KIT USE TO TEST TWICE DAILY 09/30/19   [provider]  buprenorphine (BUTRANS) 15 MCG/HR Place onto the skin once a week.    [provider]  cetirizine (ZYRTEC) 10 MG tablet Take 10 mg by mouth daily. 01/12/20   [provider]  diclofenac Sodium (VOLTAREN) 1 % GEL Apply 4 g topically 4 (four) times daily. 04/11/22   Clark, Meghan R, PA-C  dicyclomine (BENTYL) 10 MG capsule Take 1 capsule (10 mg total) by mouth every 12 (twelve) hours as needed (abdominal pain). 01/15/22   Dolores Frame, MD  gabapentin (NEURONTIN) 300 MG capsule Take 300 mg by mouth 2 (two) times daily. 12/29/21   [provider]  glyBURIDE-metformin (GLUCOVANCE) 5-500 MG per tablet Take 2 tablets by mouth 2 (two) times daily with a meal.    [provider]  hydrochlorothiazide (HYDRODIURIL) 12.5 MG tablet Take 12.5  mg by mouth daily. 07/20/21   [provider]  JARDIANCE 25 MG TABS tablet Take 25 mg by mouth daily. 08/11/21   [provider]  labetalol (NORMODYNE) 200 MG tablet Take 200 mg by mouth daily at 6 (six) AM.    [provider]  Lancets (ONETOUCH DELICA PLUS LANCET33G) MISC USE TO TEST TWICE DAILY 09/30/19   [provider]  metoprolol succinate (TOPROL-XL) 50 MG 24 hr tablet Take 50 mg by mouth daily. Take with or immediately following a meal.    [provider]  nystatin (MYCOSTATIN) 100000 UNIT/ML suspension Use as directed 5 mLs (500,000 Units total) in the mouth or throat 4 (four) times daily. Swish and swallow 11/08/22   Carlan, Chelsea L, NP  omeprazole (PRILOSEC) 40 MG capsule TAKE 1 CAPSULE(40 MG) BY MOUTH DAILY 11/09/22   Carlan, Jeral Pinch, NP  ONETOUCH VERIO test strip USE TO TEST TWICE DAILY 09/30/19   [provider]  polyethylene glycol powder (GLYCOLAX) 17 GM/SCOOP powder Take 17 g by mouth daily. Patient taking differently: Take 17 g by mouth as needed. 04/17/21   Dolores Frame, MD  prochlorperazine (COMPAZINE) 10 MG tablet Take 1 tablet (10 mg total) by mouth every 8 (eight) hours for 14 days. 03/02/22 11/08/22  Dolores Frame, MD  ramipril (ALTACE) 10 MG capsule Take 10 mg by mouth daily.  02/05/14   [provider]  simvastatin (ZOCOR) 20 MG tablet Take 20 mg by mouth daily. 04/15/13  [provider]  tamsulosin (FLOMAX) 0.4 MG CAPS capsule Take 0.4 mg by mouth daily. 06/13/21   [provider]  bismuth-metronidazole-tetracycline (PLYERA) 140-125-125 MG per capsule Take 3 capsules by mouth 4 (four) times daily -  before meals and at bedtime.  09/26/11  [provider]     Allergies    Aspirin and Bayer aspirin [aspirin]   Review of Systems   Review of Systems Please see HPI for pertinent positives and negatives  Physical Exam BP 138/72   Pulse 66   Temp 97.6 F (36.4 C)  (Oral)   Resp 18   Ht 5\' 10"  (1.778 m)   Wt 60.5 kg   SpO2 99%   BMI 19.14 kg/m   Physical Exam Vitals and nursing note reviewed.  Constitutional:      Appearance: Normal appearance.  HENT:     Head: Normocephalic and atraumatic.     Nose: Nose normal.     Mouth/Throat:     Mouth: Mucous membranes are moist.     Comments: No thrush, normal appearing tongue and posterior pharynx Eyes:     Extraocular Movements: Extraocular movements intact.     Conjunctiva/sclera: Conjunctivae normal.  Cardiovascular:     Rate and Rhythm: Normal rate.  Pulmonary:     Effort: Pulmonary effort is normal.     Breath sounds: Normal breath sounds.  Abdominal:     General: Abdomen is flat.     Palpations: Abdomen is soft.     Tenderness: There is no abdominal tenderness. There is no guarding. Negative signs include Murphy's sign and McBurney's sign.  Musculoskeletal:        General: No swelling. Normal range of motion.     Cervical back: Neck supple.  Skin:    General: Skin is warm and dry.  Neurological:     General: No focal deficit present.     Mental Status: He is alert.  Psychiatric:        Mood and Affect: Mood normal.     ED Results / Procedures / Treatments   EKG None  Procedures Procedures  Medications Ordered in the ED Medications - No data to display  Initial Impression and Plan  Patient here for evaluation of abdominal pain and possible thrush. His exam is normal. Vitals are reassuring. He had labs in triage showing CBC with mild leukopenia and anemia, similar to previous. CMP and Lipase are normal. UA is clear. Offered patient CT imaging to rule out acute surgical or life threatening process but he states he is feeling better and would like to go home. Recommend he continue with his outpatient GI management. RTED for any other concerns   ED Course       MDM Rules/Calculators/A&P Medical Decision Making Problems Addressed: Epigastric pain: chronic illness or injury  with exacerbation, progression, or side effects of treatment  Amount and/or Complexity of Data Reviewed Labs: ordered. Decision-making details documented in ED Course.     Final Clinical Impression(s) / ED Diagnoses Final diagnoses:  Epigastric pain    Rx / DC Orders ED Discharge Orders     None        Pollyann Savoy, MD 12/25/22 [phone]

## 2022-12-27 NOTE — Patient Instructions (Addendum)
[name]  12/27/2022     @PREFPERIOPPHARMACY @   Your procedure is scheduled on  01/01/2023.   Report to [facility] at  0700  A.M.   Call this number if you have problems the morning of surgery:  [phone]  If you experience any cold or flu symptoms such as cough, fever, chills, shortness of breath, etc. between now and your scheduled surgery, please notify us at the above number.   Remember:  Follow the diet instructions given to you by the office.     Your last dose of jardiance should be on 12/28/2022.      DO NOT take any medications for diabetes the morning of your procedure.     Take these medicines the morning of surgery with A SIP OF WATER        amlodipine, zyrtec, gabapentin, labetolol, metoprolol, omeprazole, flomax.     Do not wear jewelry, make-up or nail polish, including gel polish,  artificial nails, or any other type of covering on natural nails (fingers and  toes).  Do not wear lotions, powders, or perfumes, or deodorant.  Do not shave 48 hours prior to surgery.  Men may shave face and neck.  Do not bring valuables to the hospital.  [facility] is not responsible for any belongings or valuables.  Contacts, dentures or bridgework may not be worn into surgery.  Leave your suitcase in the car.  After surgery it may be brought to your room.  For patients admitted to the hospital, discharge time will be determined by your treatment team.  Patients discharged the day of surgery will not be allowed to drive home and must have someone with them for 24 hours.    Special instructions:   DO NOT smoke tobacco or vape for 24 hours before your procedure.  Please read over the following fact sheets that you were given. Anesthesia Post-op Instructions and Care and Recovery After Surgery      Upper Endoscopy, Adult, Care After After the procedure, it is common to have a sore throat. It is also common to have: Mild stomach pain or  discomfort. Bloating. Nausea. Follow these instructions at home: The instructions below may help you care for yourself at home. Your health care provider may give you more instructions. If you have questions, ask your health care provider. If you were given a sedative during the procedure, it can affect you for several hours. Do not drive or operate machinery until your health care provider says that it is safe. If you will be going home right after the procedure, plan to have a responsible adult: Take you home from the hospital or clinic. You will not be allowed to drive. Care for you for the time you are told. Follow instructions from your health care provider about what you may eat and drink. Return to your normal activities as told by your health care provider. Ask your health care provider what activities are safe for you. Take over-the-counter and prescription medicines only as told by your health care provider. Contact a health care provider if you: Have a sore throat that lasts longer than one day. Have trouble swallowing. Have a fever. Get help right away if you: Vomit blood or your vomit looks like coffee grounds. Have bloody, black, or tarry stools. Have a very bad sore throat or you cannot swallow. Have difficulty breathing or very bad pain in your chest or abdomen. These symptoms may  be an emergency. Get help right away. Call 911. Do not wait to see if the symptoms will go away. Do not drive yourself to the hospital. Summary After the procedure, it is common to have a sore throat, mild stomach discomfort, bloating, and nausea. If you were given a sedative during the procedure, it can affect you for several hours. Do not drive until your health care provider says that it is safe. Follow instructions from your health care provider about what you may eat and drink. Return to your normal activities as told by your health care provider. This information is not intended to replace  advice given to you by your health care provider. Make sure you discuss any questions you have with your health care provider. Document Revised: 10/04/2021 Document Reviewed: 10/04/2021 Elsevier Patient Education  2024 [employer] Monitored Anesthesia Care, Care After The following information offers guidance on how to care for yourself after your procedure. Your health care provider may also give you more specific instructions. If you have problems or questions, contact your health care provider. What can I expect after the procedure? After the procedure, it is common to have: Tiredness. Little or no memory about what happened during or after the procedure. Impaired judgment when it comes to making decisions. Nausea or vomiting. Some trouble with balance. Follow these instructions at home: For the time period you were told by your health care provider:  Rest. Do not participate in activities where you could fall or become injured. Do not drive or use machinery. Do not drink alcohol. Do not take sleeping pills or medicines that cause drowsiness. Do not make important decisions or sign legal documents. Do not take care of children on your own. Medicines Take over-the-counter and prescription medicines only as told by your health care provider. If you were prescribed antibiotics, take them as told by your health care provider. Do not stop using the antibiotic even if you start to feel better. Eating and drinking Follow instructions from your health care provider about what you may eat and drink. Drink enough fluid to keep your urine pale yellow. If you vomit: Drink clear fluids slowly and in small amounts as you are able. Clear fluids include water, ice chips, low-calorie sports drinks, and fruit juice that has water added to it (diluted fruit juice). Eat light and bland foods in small amounts as you are able. These foods include bananas, applesauce, rice, lean meats, toast, and  crackers. General instructions  Have a responsible adult stay with you for the time you are told. It is important to have someone help care for you until you are awake and alert. If you have sleep apnea, surgery and some medicines can increase your risk for breathing problems. Follow instructions from your health care provider about wearing your sleep device: When you are sleeping. This includes during daytime naps. While taking prescription pain medicines, sleeping medicines, or medicines that make you drowsy. Do not use any products that contain nicotine or tobacco. These products include cigarettes, chewing tobacco, and vaping devices, such as e-cigarettes. If you need help quitting, ask your health care provider. Contact a health care provider if: You feel nauseous or vomit every time you eat or drink. You feel light-headed. You are still sleepy or having trouble with balance after 24 hours. You get a rash. You have a fever. You have redness or swelling around the IV site. Get help right away if: You have trouble breathing. You have new confusion after  you get home. These symptoms may be an emergency. Get help right away. Call 911. Do not wait to see if the symptoms will go away. Do not drive yourself to the hospital. This information is not intended to replace advice given to you by your health care provider. Make sure you discuss any questions you have with your health care provider. Document Revised: 11/20/2021 Document Reviewed: 11/20/2021 Elsevier Patient Education  2024 [employer].

## 2022-12-28 ENCOUNTER — Encounter (HOSPITAL_COMMUNITY)
Admission: RE | Admit: 2022-12-28 | Discharge: 2022-12-28 | Disposition: A | Discharge status: Home / Self Care | Payer: Medicare HMO | Source: Ambulatory Visit | Attending: Gastroenterology | Admitting: Gastroenterology

## 2022-12-28 VITALS — BP 138/72 | HR 66 | Temp 97.6°F | Resp 18 | Ht 70.0 in | Wt 133.4 lb

## 2022-12-28 DIAGNOSIS — Z01818 Encounter for other preprocedural examination: Secondary | ICD-10-CM | POA: Insufficient documentation

## 2022-12-28 DIAGNOSIS — I491 Atrial premature depolarization: Secondary | ICD-10-CM | POA: Insufficient documentation

## 2022-12-28 DIAGNOSIS — I10 Essential (primary) hypertension: Secondary | ICD-10-CM | POA: Diagnosis not present

## 2023-01-01 ENCOUNTER — Encounter (HOSPITAL_COMMUNITY)
Admission: RE | Disposition: A | Discharge status: Home / Self Care | Payer: Self-pay | Source: Ambulatory Visit | Attending: Gastroenterology

## 2023-01-01 ENCOUNTER — Encounter (HOSPITAL_COMMUNITY): Payer: Self-pay | Admitting: Gastroenterology

## 2023-01-01 ENCOUNTER — Ambulatory Visit (HOSPITAL_BASED_OUTPATIENT_CLINIC_OR_DEPARTMENT_OTHER): Payer: Medicare HMO | Admitting: Anesthesiology

## 2023-01-01 ENCOUNTER — Ambulatory Visit (HOSPITAL_COMMUNITY): Payer: Medicare HMO | Admitting: Anesthesiology

## 2023-01-01 ENCOUNTER — Ambulatory Visit (HOSPITAL_COMMUNITY)
Admission: RE | Admit: 2023-01-01 | Discharge: 2023-01-01 | Disposition: A | Discharge status: Home / Self Care | Payer: Medicare HMO | Source: Ambulatory Visit | Attending: Gastroenterology | Admitting: Gastroenterology

## 2023-01-01 DIAGNOSIS — R1310 Dysphagia, unspecified: Secondary | ICD-10-CM | POA: Insufficient documentation

## 2023-01-01 DIAGNOSIS — K2970 Gastritis, unspecified, without bleeding: Secondary | ICD-10-CM | POA: Insufficient documentation

## 2023-01-01 DIAGNOSIS — I10 Essential (primary) hypertension: Secondary | ICD-10-CM | POA: Diagnosis not present

## 2023-01-01 DIAGNOSIS — G4730 Sleep apnea, unspecified: Secondary | ICD-10-CM | POA: Diagnosis not present

## 2023-01-01 DIAGNOSIS — B9681 Helicobacter pylori [H. pylori] as the cause of diseases classified elsewhere: Secondary | ICD-10-CM | POA: Insufficient documentation

## 2023-01-01 DIAGNOSIS — Z79899 Other long term (current) drug therapy: Secondary | ICD-10-CM | POA: Insufficient documentation

## 2023-01-01 DIAGNOSIS — E119 Type 2 diabetes mellitus without complications: Secondary | ICD-10-CM | POA: Diagnosis not present

## 2023-01-01 DIAGNOSIS — K3189 Other diseases of stomach and duodenum: Secondary | ICD-10-CM | POA: Insufficient documentation

## 2023-01-01 DIAGNOSIS — K219 Gastro-esophageal reflux disease without esophagitis: Secondary | ICD-10-CM | POA: Diagnosis not present

## 2023-01-01 DIAGNOSIS — B370 Candidal stomatitis: Secondary | ICD-10-CM

## 2023-01-01 DIAGNOSIS — B3781 Candidal esophagitis: Secondary | ICD-10-CM

## 2023-01-01 HISTORY — PX: ESOPHAGOGASTRODUODENOSCOPY (EGD) WITH PROPOFOL: SHX5813

## 2023-01-01 HISTORY — PX: BIOPSY: SHX5522

## 2023-01-01 LAB — GLUCOSE, CAPILLARY: Glucose-Capillary: 143 mg/dL — ABNORMAL HIGH (ref 70–99)

## 2023-01-01 SURGERY — ESOPHAGOGASTRODUODENOSCOPY (EGD) WITH PROPOFOL
Anesthesia: General

## 2023-01-01 MED ORDER — PHENYLEPHRINE 80 MCG/ML (10ML) SYRINGE FOR IV PUSH (FOR BLOOD PRESSURE SUPPORT)
PREFILLED_SYRINGE | INTRAVENOUS | Status: AC
  Filled 2023-01-01: qty 10

## 2023-01-01 MED ORDER — PROPOFOL 10 MG/ML IV BOLUS
INTRAVENOUS | Status: DC | PRN
  Administered 2023-01-01: 100 mg via INTRAVENOUS

## 2023-01-01 MED ORDER — LACTATED RINGERS IV SOLN: INTRAVENOUS | Status: DC

## 2023-01-01 MED ORDER — CHLORHEXIDINE GLUCONATE CLOTH 2 % EX PADS: 6.0000 | MEDICATED_PAD | Freq: Once | CUTANEOUS | Status: DC

## 2023-01-01 MED ORDER — PROPOFOL 500 MG/50ML IV EMUL
INTRAVENOUS | Status: DC | PRN
  Administered 2023-01-01: 150 ug/kg/min via INTRAVENOUS

## 2023-01-01 MED ORDER — LACTATED RINGERS IV SOLN: INTRAVENOUS | Status: DC | PRN

## 2023-01-01 MED ORDER — EPHEDRINE SULFATE-NACL 50-0.9 MG/10ML-% IV SOSY
PREFILLED_SYRINGE | INTRAVENOUS | Status: DC | PRN
  Administered 2023-01-01 (×2): 5 mg via INTRAVENOUS

## 2023-01-01 MED ORDER — LIDOCAINE HCL (CARDIAC) PF 100 MG/5ML IV SOSY
PREFILLED_SYRINGE | INTRAVENOUS | Status: DC | PRN
  Administered 2023-01-01: 50 mg via INTRAVENOUS

## 2023-01-01 MED ORDER — NYSTATIN 100000 UNIT/ML MT SUSP
5.0000 mL | Freq: Four times a day (QID) | OROMUCOSAL | 0 refills | Status: AC
Start: 2023-01-01 — End: ?

## 2023-01-01 MED ORDER — PHENYLEPHRINE 80 MCG/ML (10ML) SYRINGE FOR IV PUSH (FOR BLOOD PRESSURE SUPPORT)
PREFILLED_SYRINGE | INTRAVENOUS | Status: DC | PRN
  Administered 2023-01-01 (×3): 160 ug via INTRAVENOUS

## 2023-01-01 MED ORDER — EPHEDRINE 5 MG/ML INJ
INTRAVENOUS | Status: AC
  Filled 2023-01-01: qty 5

## 2023-01-01 NOTE — H&P (Signed)
[name] is an 82 y.o. male.   Chief Complaint: Odynophagia HPI: [name] is a 82 y.o. male with past medical history of GERD, diabetes, Schatzki's ring, hypertension, opioid-induced constipation, esophageal dysmotility, hyperlipidemia and OSA, who presents for evaluation of odynophagia.  Patient reports having burning sensation in his mouth and odynophagia.  He was prescribed a statin in the past but he swish and spit the medication.  Past Medical History:  Diagnosis Date   Arthritis    fingers, back   Chronic back pain    Diabetes mellitus Since 1995   Takes Glucovance and Onglyza daily   Diabetes mellitus without complication (HCC)    GERD (gastroesophageal reflux disease)    takes Dexilant and Omeprazole daily   Herpes    History of colon polyps    History of gastric ulcer 25+yrs ago   Hyperlipidemia Since 1995   takes Simvastatin daily   Hypertension Since 1995   takes Metoprolol and Ramipril daily   Hypertension    Pneumonia    hx of;as a child   Sleep apnea    doesn't use CPAP   Urinary frequency    Urinary urgency    takes Flomax daily    Past Surgical History:  Procedure Laterality Date   BACK SURGERY  1962   Lumbar   BACK SURGERY     bilateral cataract surgery     BIOPSY  02/23/2022   Procedure: BIOPSY;  Surgeon: Dolores Frame, MD;  [facility]: [facility];  Service: Gastroenterology;;   CARDIAC CATHETERIZATION  02/16/10   CERVICAL SPINE SURGERY     COLONOSCOPY     COLONOSCOPY N/A 04/23/2013   Procedure: COLONOSCOPY;  Surgeon: Malissa Hippo, MD;  Location: [facility];  Service: Endoscopy;  Laterality: N/A;  1030   COLONOSCOPY N/A 07/24/2018   Procedure: COLONOSCOPY;  Surgeon: Malissa Hippo, MD;  Location: [facility];  Service: Endoscopy;  Laterality: N/A;  8:30   COLONOSCOPY WITH PROPOFOL N/A 11/28/2021   Procedure: COLONOSCOPY WITH PROPOFOL;  Surgeon: Dolores Frame, MD;  Location: [facility];  Service:  Gastroenterology;  Laterality: N/A;  205   ESOPHAGEAL DILATION N/A 02/10/2020   Procedure: ESOPHAGEAL DILATION;  Surgeon: Malissa Hippo, MD;  [facility]: [facility];  Service: Endoscopy;  Laterality: N/A;   ESOPHAGOGASTRODUODENOSCOPY N/A 12/24/2014   Procedure: ESOPHAGOGASTRODUODENOSCOPY (EGD);  Surgeon: Malissa Hippo, MD;  [facility]: [facility];  Service: Endoscopy;  Laterality: N/A;  2:45   ESOPHAGOGASTRODUODENOSCOPY (EGD) WITH ESOPHAGEAL DILATION     ESOPHAGOGASTRODUODENOSCOPY (EGD) WITH PROPOFOL N/A 02/10/2020   Procedure: ESOPHAGOGASTRODUODENOSCOPY (EGD) WITH PROPOFOL;  Surgeon: Malissa Hippo, MD;  [facility]: [facility];  Service: Endoscopy;  Laterality: N/A;  250   ESOPHAGOGASTRODUODENOSCOPY (EGD) WITH PROPOFOL N/A 02/23/2022   Procedure: ESOPHAGOGASTRODUODENOSCOPY (EGD) WITH PROPOFOL;  Surgeon: Dolores Frame, MD;  [facility]: [facility];  Service: Gastroenterology;  Laterality: N/A;  1030   HAND SURGERY     Right   LUMBAR LAMINECTOMY/DECOMPRESSION MICRODISCECTOMY N/A 08/29/2012   Procedure: LUMBAR LAMINECTOMY/DECOMPRESSION MICRODISCECTOMY 1 LEVEL;  Surgeon: Karn Cassis, MD;  Location: [facility];  Service: Neurosurgery;  Laterality: N/A;  Lumbar four-five laminectomies   NECK SURGERY     PENILE PROSTHESIS IMPLANT     PENILE PROSTHESIS IMPLANT N/A 08/07/2021   Procedure: PENILE PROTHESIS INFLATABLE;  Surgeon: Malen Gauze, MD;  Location: [facility];  Service: Urology;  Laterality: N/A;   POLYPECTOMY  11/28/2021   Procedure: POLYPECTOMY;  Surgeon: Marguerita Merles, Reuel Boom, MD;  Location: [facility];  Service: Gastroenterology;;   REMOVAL OF PENILE PROSTHESIS N/A 08/07/2021   Procedure: REMOVAL OF PENILE PROSTHESIS;  Surgeon: Malen Gauze, MD;  Location: [facility];  Service: Urology;  Laterality: N/A;   ROTATOR CUFF REPAIR     Right   SHOULDER SURGERY     TRANSURETHRAL RESECTION OF PROSTATE N/A 05/25/2019   Procedure: TRANSURETHRAL RESECTION OF THE  PROSTATE (TURP);  Surgeon: Malen Gauze, MD;  Location: [facility];  Service: Urology;  Laterality: N/A;   WRIST SURGERY     Left   WRIST SURGERY      Family History  Problem Relation Age of Onset   Heart attack Father 22       MI   Hypertension Father    Heart attack Brother 68       MI   Diabetes Mother    Heart attack Paternal Uncle    Social History:  reports that he has never smoked. He has never used smokeless tobacco. He reports that he does not currently use alcohol. He reports current drug use. Frequency: 3.00 times per week. Drug: Marijuana.  Allergies:  Allergies  Allergen Reactions   Aspirin Other (See Comments)    Higher dosage upsets stomach   Bayer Aspirin [Aspirin] Other (See Comments)    Stomach "flares up"    Medications Prior to Admission  Medication Sig Dispense Refill   amLODipine (NORVASC) 10 MG tablet Take 10 mg by mouth daily.     aspirin EC 81 MG tablet Take 81 mg by mouth daily. Swallow whole.     cetirizine (ZYRTEC) 10 MG tablet Take 10 mg by mouth daily.     diclofenac Sodium (VOLTAREN) 1 % GEL Apply 4 g topically 4 (four) times daily. 150 g 0   dicyclomine (BENTYL) 10 MG capsule Take 1 capsule (10 mg total) by mouth every 12 (twelve) hours as needed (abdominal pain). 60 capsule 2   gabapentin (NEURONTIN) 300 MG capsule Take 300 mg by mouth 2 (two) times daily.     glyBURIDE-metformin (GLUCOVANCE) 5-500 MG per tablet Take 2 tablets by mouth 2 (two) times daily with a meal.     hydrochlorothiazide (HYDRODIURIL) 12.5 MG tablet Take 12.5 mg by mouth daily.     labetalol (NORMODYNE) 200 MG tablet Take 200 mg by mouth daily at 6 (six) AM.     metoprolol succinate (TOPROL-XL) 50 MG 24 hr tablet Take 50 mg by mouth daily. Take with or immediately following a meal.     ramipril (ALTACE) 10 MG capsule Take 10 mg by mouth daily.      simvastatin (ZOCOR) 20 MG tablet Take 20 mg by mouth daily.     tamsulosin (FLOMAX) 0.4 MG CAPS capsule Take 0.4 mg by  mouth daily.     acyclovir (ZOVIRAX) 200 MG capsule Take 200 mg by mouth as needed.     Blood Glucose Monitoring Suppl (ONETOUCH VERIO FLEX SYSTEM) w/Device KIT USE TO TEST TWICE DAILY     buprenorphine (BUTRANS) 15 MCG/HR Place onto the skin once a week.     JARDIANCE 25 MG TABS tablet Take 25 mg by mouth daily.     Lancets (ONETOUCH DELICA PLUS LANCET33G) MISC USE TO TEST TWICE DAILY     nystatin (MYCOSTATIN) 100000 UNIT/ML suspension Use as directed 5 mLs (500,000 Units total) in the mouth or throat 4 (four) times daily. Swish and swallow 240 mL 0   omeprazole (PRILOSEC)  40 MG capsule TAKE 1 CAPSULE(40 MG) BY MOUTH DAILY 90 capsule 0   ONETOUCH VERIO test strip USE TO TEST TWICE DAILY     polyethylene glycol powder (GLYCOLAX) 17 GM/SCOOP powder Take 17 g by mouth daily. (Patient taking differently: Take 17 g by mouth as needed.) 850 g 3   prochlorperazine (COMPAZINE) 10 MG tablet Take 1 tablet (10 mg total) by mouth every 8 (eight) hours for 14 days. 42 tablet 0    No results found for this or any previous visit (from the past 48 hour(s)). No results found.  Review of Systems  All other systems reviewed and are negative.   Blood pressure 135/73, pulse (!) 53, temperature 97.6 F (36.4 C), temperature source Axillary, resp. rate 13, height 5\' 10"  (1.778 m), weight 60.5 kg, SpO2 100 %. Physical Exam  GENERAL: The patient is AO x3, in no acute distress. HEENT: Head is normocephalic and atraumatic. EOMI are intact. Mouth is well hydrated and without lesions. NECK: Supple. No masses LUNGS: Clear to auscultation. No presence of rhonchi/wheezing/rales. Adequate chest expansion HEART: RRR, normal s1 and s2. ABDOMEN: Soft, nontender, no guarding, no peritoneal signs, and nondistended. BS +. No masses. EXTREMITIES: Without any cyanosis, clubbing, rash, lesions or edema. NEUROLOGIC: AOx3, no focal motor deficit. SKIN: no jaundice, no rashes  Assessment/Plan [name] is a 82 y.o.  male with past medical history of GERD, diabetes, Schatzki's ring, hypertension, opioid-induced constipation, esophageal dysmotility, hyperlipidemia and OSA, who presents for evaluation of odynophagia. Will proceed with EGD.   Dolores Frame, MD 01/01/2023, 7:51 AM

## 2023-01-01 NOTE — Transfer of Care (Signed)
Immediate Anesthesia Transfer of Care Note  Patient: [name]  Procedure(s) Performed: ESOPHAGOGASTRODUODENOSCOPY (EGD) WITH PROPOFOL BIOPSY  Patient Location: Short Stay  Anesthesia Type:General  Level of Consciousness: drowsy  Airway & Oxygen Therapy: Patient Spontanous Breathing  Post-op Assessment: Report given to RN and Post -op Vital signs reviewed and stable  Post vital signs: Reviewed and stable  Last Vitals:  Vitals Value Taken Time  BP    Temp    Pulse    Resp    SpO2      Last Pain:  Vitals:   01/01/23 0848  TempSrc:   PainSc: 4          Complications: No notable events documented.

## 2023-01-01 NOTE — Anesthesia Preprocedure Evaluation (Signed)
Anesthesia Evaluation  Patient identified by MRN, date of birth, ID band Patient awake    Reviewed: Allergy & Precautions, H&P , NPO status , Patient's Chart, lab work & pertinent test results, reviewed documented beta blocker date and time   Airway Mallampati: II  TM Distance: >3 FB Neck ROM: full    Dental no notable dental hx.    Pulmonary neg pulmonary ROS, sleep apnea , pneumonia, Patient abstained from smoking.   Pulmonary exam normal breath sounds clear to auscultation       Cardiovascular Exercise Tolerance: Good hypertension, negative cardio ROS  Rhythm:regular Rate:Normal     Neuro/Psych negative neurological ROS  negative psych ROS   GI/Hepatic negative GI ROS, Neg liver ROS,GERD  ,,  Endo/Other  negative endocrine ROSdiabetes    Renal/GU negative Renal ROS  negative genitourinary   Musculoskeletal   Abdominal   Peds  Hematology negative hematology ROS (+)   Anesthesia Other Findings   Reproductive/Obstetrics negative OB ROS                             Anesthesia Physical Anesthesia Plan  ASA: 2  Anesthesia Plan: General   Post-op Pain Management:    Induction:   PONV Risk Score and Plan: Propofol infusion  Airway Management Planned:   Additional Equipment:   Intra-op Plan:   Post-operative Plan:   Informed Consent: I have reviewed the patients History and Physical, chart, labs and discussed the procedure including the risks, benefits and alternatives for the proposed anesthesia with the patient or authorized representative who has indicated his/her understanding and acceptance.     Dental Advisory Given  Plan Discussed with: CRNA  Anesthesia Plan Comments:        Anesthesia Quick Evaluation

## 2023-01-01 NOTE — Discharge Instructions (Signed)
You are being discharged to home.  Resume your previous diet.  We are waiting for your pathology results.  Take nystatin for complete 21 day course - swish and swallow.

## 2023-01-01 NOTE — Op Note (Signed)
[facility] Patient Name: [name] Procedure Date: 01/01/2023 8:38 AM MRN: [id number] Date of Birth: 17-Nov-1940 Attending MD: Katrinka Blazing , , [id number] CSN: [id number] Age: 82 Admit Type: Outpatient Procedure:                Upper GI endoscopy Indications:              Odynophagia Providers:                Katrinka Blazing, Buel Ream. Thomasena Edis RN, RN, Cyril Mourning, Technician Referring MD:              Medicines:                Monitored Anesthesia Care Complications:            No immediate complications. Estimated Blood Loss:     Estimated blood loss: none. Procedure:                Pre-Anesthesia Assessment:                           - Prior to the procedure, a History and Physical                            was performed, and patient medications, allergies                            and sensitivities were reviewed. The patient's                            tolerance of previous anesthesia was reviewed.                           - The risks and benefits of the procedure and the                            sedation options and risks were discussed with the                            patient. All questions were answered and informed                            consent was obtained.                           - ASA Grade Assessment: III - A patient with severe                            systemic disease.                           After obtaining informed consent, the endoscope was                            passed under direct vision. Throughout the  procedure, the patient's blood pressure, pulse, and                            oxygen saturations were monitored continuously. The                            [id number]-H190 ([id number]) scope was introduced through the                            mouth, and advanced to the second part of duodenum.                            The upper GI endoscopy was accomplished without                             difficulty. The patient tolerated the procedure                            well. Scope In: 8:55:06 AM Scope Out: 9:01:20 AM Total Procedure Duration: 0 hours 6 minutes 14 seconds  Findings:      The examined esophagus was normal.      Diffuse atrophic mucosa was found in the entire examined stomach.       Biopsies were taken from body, incisura and antrum with a cold forceps       for Helicobacter pylori testing and histology.      The examined duodenum was normal. Impression:               - Normal esophagus.                           - Gastric mucosal atrophy. Biopsied.                           - Normal examined duodenum. Moderate Sedation:      Per Anesthesia Care Recommendation:           - Discharge patient to home (ambulatory).                           - Resume previous diet.                           - Await pathology results.                           - Take nystatin for complete 21 day course - swish                            and swallow. Procedure Code(s):        --- Professional ---                           [phone], Esophagogastroduodenoscopy, flexible,                            transoral; with biopsy, single or multiple Diagnosis Code(s):        ---  Professional ---                           K31.89, Other diseases of stomach and duodenum                           R13.10, Dysphagia, unspecified CPT copyright 2022 American Medical Association. All rights reserved. The codes documented in this report are preliminary and upon coder review may  be revised to meet current compliance requirements. Katrinka Blazing, MD Katrinka Blazing,  01/01/2023 9:11:02 AM This report has been signed electronically. Number of Addenda: 0

## 2023-01-01 NOTE — Anesthesia Procedure Notes (Signed)
Date/Time: 01/01/2023 8:49 AM  Performed by: Julian Reil, CRNAPre-anesthesia Checklist: Patient identified, Emergency Drugs available, Suction available and Patient being monitored Patient Re-evaluated:Patient Re-evaluated prior to induction Oxygen Delivery Method: Nasal cannula Induction Type: IV induction Placement Confirmation: positive ETCO2

## 2023-01-03 NOTE — Anesthesia Postprocedure Evaluation (Signed)
Anesthesia Post Note  Patient: [name]  Procedure(s) Performed: ESOPHAGOGASTRODUODENOSCOPY (EGD) WITH PROPOFOL BIOPSY  Patient location during evaluation: Phase II Anesthesia Type: General Level of consciousness: awake Pain management: pain level controlled Vital Signs Assessment: post-procedure vital signs reviewed and stable Respiratory status: spontaneous breathing and respiratory function stable Cardiovascular status: blood pressure returned to baseline and stable Postop Assessment: no headache and no apparent nausea or vomiting Anesthetic complications: no Comments: Late entry   No notable events documented.   Last Vitals:  Vitals:   01/01/23 0744 01/01/23 0903  BP: 135/73 (!) 103/43  Pulse: (!) 53 (!) 53  Resp: 13   Temp: 36.4 C 36.5 C  SpO2: 100% 100%    Last Pain:  Vitals:   01/02/23 1402  TempSrc:   PainSc: 0-No pain                 Windell Norfolk

## 2023-01-04 ENCOUNTER — Ambulatory Visit: Payer: Medicare HMO | Admitting: Urology

## 2023-01-04 LAB — SURGICAL PATHOLOGY

## 2023-01-07 ENCOUNTER — Encounter (HOSPITAL_COMMUNITY): Payer: Self-pay | Admitting: Gastroenterology

## 2023-01-07 ENCOUNTER — Telehealth (INDEPENDENT_AMBULATORY_CARE_PROVIDER_SITE_OTHER): Payer: Self-pay

## 2023-01-07 ENCOUNTER — Other Ambulatory Visit (INDEPENDENT_AMBULATORY_CARE_PROVIDER_SITE_OTHER): Payer: Self-pay | Admitting: Gastroenterology

## 2023-01-07 DIAGNOSIS — K2970 Gastritis, unspecified, without bleeding: Secondary | ICD-10-CM

## 2023-01-07 MED ORDER — METRONIDAZOLE 500 MG PO TABS
500.0000 mg | ORAL_TABLET | Freq: Three times a day (TID) | ORAL | 0 refills | Status: AC
Start: 2023-01-07 — End: 2023-01-21

## 2023-01-07 MED ORDER — TETRACYCLINE HCL 500 MG PO CAPS
500.0000 mg | ORAL_CAPSULE | Freq: Four times a day (QID) | ORAL | 0 refills | Status: AC
Start: 2023-01-07 — End: 2023-01-21

## 2023-01-07 MED ORDER — BISMUTH 262 MG PO CHEW
2.0000 | CHEWABLE_TABLET | Freq: Four times a day (QID) | ORAL | 0 refills | Status: DC
Start: 2023-01-07 — End: 2023-10-21

## 2023-01-07 NOTE — Telephone Encounter (Signed)
I sent prescription for metronidazole, tetracycline and bismuth separately. Thanks

## 2023-01-07 NOTE — Telephone Encounter (Signed)
Date: 01/07/2023  Enrollee's Name: [name] Member Number: [id number]   Your request was denied   We have denied coverage or payment under your Medicare Part D benefit for the following prescription drug(s) that you or your prescriber requested: PYLERA Capsule Why did we deny your request? We denied this request under Medicare Part D because: The requested drug is not on your plan's formulary (list of covered drugs). Your Medicare Part D drug plan was asked to cover a drug that is not on the formulary (this is called a formulary exception). Your prescriber did not provide the detailed information that is required in order to approve the request. To receive a formulary exception, per the Part D Coverage Determination guidance Section 40.5.2 and 40.5.3, your prescriber must provide information that documents at least one of the following has occurred: - You have tried the formulary drugs for the treatment of your condition and they did not work for you. OR - The formulary drugs could cause adverse effects. OR - The formulary drugs would be less effective for your condition than the requested drug. Talk to your prescriber to see if the following covered alternative(s) would be right for you: Sucralfate oral suspension sucralfate tablet misoprostol tablet Form CMS-10146 OMB Approval No. [id number] (Expires 07/09/2023) You should share a copy of this decision with your prescriber so you and your prescriber can discuss next steps. If your prescriber requested coverage on your behalf, we have shared this decision with your prescriber.

## 2023-01-08 NOTE — Telephone Encounter (Signed)
Per [facility] pharmacy patient picked up the medications.

## 2023-02-07 ENCOUNTER — Telehealth (INDEPENDENT_AMBULATORY_CARE_PROVIDER_SITE_OTHER): Payer: Self-pay | Admitting: *Deleted

## 2023-02-07 ENCOUNTER — Ambulatory Visit (INDEPENDENT_AMBULATORY_CARE_PROVIDER_SITE_OTHER): Payer: Medicare HMO | Admitting: Gastroenterology

## 2023-02-07 ENCOUNTER — Encounter (INDEPENDENT_AMBULATORY_CARE_PROVIDER_SITE_OTHER): Payer: Self-pay | Admitting: Gastroenterology

## 2023-02-07 VITALS — BP 92/52 | HR 64 | Temp 97.8°F | Ht 70.0 in | Wt 129.0 lb

## 2023-02-07 DIAGNOSIS — K146 Glossodynia: Secondary | ICD-10-CM

## 2023-02-07 DIAGNOSIS — B9681 Helicobacter pylori [H. pylori] as the cause of diseases classified elsewhere: Secondary | ICD-10-CM | POA: Diagnosis not present

## 2023-02-07 DIAGNOSIS — R634 Abnormal weight loss: Secondary | ICD-10-CM

## 2023-02-07 DIAGNOSIS — R1310 Dysphagia, unspecified: Secondary | ICD-10-CM

## 2023-02-07 DIAGNOSIS — R208 Other disturbances of skin sensation: Secondary | ICD-10-CM | POA: Insufficient documentation

## 2023-02-07 NOTE — Patient Instructions (Signed)
Referral to ENT Stop omeprazole for 2 weeks and then perform breath test Schedule CT neck and chest with IV contrast

## 2023-02-07 NOTE — Progress Notes (Signed)
[name], [name] Gastroenterology & Hepatology [facility] [address] [place], [place] [address]  Primary Care Physician: Barry Spar, MD [address] [place] [address]  I will communicate my assessment and recommendations to the referring MD via EMR.  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 pylori  History of Present Illness: [name] is a 82 y.o. male with past medical history of GERD, diabetes, Schatzki's ring, hypertension, opioid-induced constipation, esophageal dysmotility, hyperlipidemia and OSA  who presents for follow up of burning in his mouth and H. pylori infection.  The patient was last seen on 11/08/2022. At that time, the patient is scheduled for EGD and was given nystatin mouthwash and prescribed omeprazole 40 mg daily.  Esophagogastroduodenospy has fine described low.  As patient was found to have H. pylori, he was sent repeat bismuth quadruple therapy for 2 weeks .  Also sent a prescription for clotrimazole troches every 6 hours for 2 weeks.  Patient reports that he has had persistent burning in his mouth despite finishing his medication regimen. He is having burning in his mouth all the time. Also reports that he is having burning sensation in his chest and in his abdomen. States his appetite has been good but he is eating slightly less than in the past.  The patient denies having any nausea, vomiting, fever, chills, hematochezia, melena, hematemesis, abdominal distention,diarrhea, jaundice, pruritus . Has lost 30 lb since 10/2020.  Last EGD: 01/01/2023 - Normal esophagus. - Gastric mucosal atrophy. Biopsied. - Normal examined duodenum. Pathology showed H. Pylori gastritis.  Last Colonoscopy:11/28/2021 The perianal and digital rectal examinations were  normal. Six sessile polyps were found in the ascending colon. The polyps were 2 to 6 mm in size. These polyps were removed with a cold snare. Resection and retrieval were complete for 3 polyps, but the other 3 could not be retrieved (fully resected). The ascending colon and cecum appeared normal othwerwise. CT scan findings likely related to stool. Non-bleeding internal hemorrhoids were found during retroflexion. The hemorrhoids were small.   Pathology consistent with tubular adenomas.   Recommended to have a repeat colonoscopy given patient comorbidities.  Past Medical History: Past Medical History:  Diagnosis Date   Arthritis    fingers, back   Chronic back pain    Diabetes mellitus Since 1995   Takes Glucovance and Onglyza daily   Diabetes mellitus without complication (HCC)    GERD (gastroesophageal reflux disease)    takes Dexilant and Omeprazole daily   Herpes    History of colon polyps    History of gastric ulcer 25+yrs ago   Hyperlipidemia Since 1995   takes Simvastatin daily   Hypertension Since 1995   takes Metoprolol and Ramipril daily   Hypertension    Pneumonia    hx of;as a child   Sleep apnea    doesn't use CPAP   Urinary frequency    Urinary urgency    takes Flomax daily    Past Surgical History: Past Surgical History:  Procedure Laterality Date   BACK SURGERY  1962   Lumbar   BACK SURGERY     bilateral cataract surgery     BIOPSY  02/23/2022   Procedure: BIOPSY;  Surgeon: Barry Frame, MD;  [facility];  Service: Gastroenterology;;   BIOPSY  01/01/2023   Procedure: BIOPSY;  Surgeon: Barry Frame, MD;  [facility];  Service: Gastroenterology;;   CARDIAC CATHETERIZATION  02/16/10   CERVICAL SPINE SURGERY     COLONOSCOPY     COLONOSCOPY N/A 04/23/2013   Procedure: COLONOSCOPY;  Surgeon: Malissa Hippo, MD;  [facility];  Service: Endoscopy;  Laterality: N/A;  1030   COLONOSCOPY N/A  07/24/2018   Procedure: COLONOSCOPY;  Surgeon: Malissa Hippo, MD;  [facility];  Service: Endoscopy;  Laterality: N/A;  8:30   COLONOSCOPY WITH PROPOFOL N/A 11/28/2021   Procedure: COLONOSCOPY WITH PROPOFOL;  Surgeon: Barry Frame, MD;  [facility];  Service: Gastroenterology;  Laterality: N/A;  205   ESOPHAGEAL DILATION N/A 02/10/2020   Procedure: ESOPHAGEAL DILATION;  Surgeon: Malissa Hippo, MD;  [facility];  Service: Endoscopy;  Laterality: N/A;   ESOPHAGOGASTRODUODENOSCOPY N/A 12/24/2014   Procedure: ESOPHAGOGASTRODUODENOSCOPY (EGD);  Surgeon: Malissa Hippo, MD;  [facility];  Service: Endoscopy;  Laterality: N/A;  2:45   ESOPHAGOGASTRODUODENOSCOPY (EGD) WITH ESOPHAGEAL DILATION     ESOPHAGOGASTRODUODENOSCOPY (EGD) WITH PROPOFOL N/A 02/10/2020   Procedure: ESOPHAGOGASTRODUODENOSCOPY (EGD) WITH PROPOFOL;  Surgeon: Malissa Hippo, MD;  [facility];  Service: Endoscopy;  Laterality: N/A;  250   ESOPHAGOGASTRODUODENOSCOPY (EGD) WITH PROPOFOL N/A 02/23/2022   Procedure: ESOPHAGOGASTRODUODENOSCOPY (EGD) WITH PROPOFOL;  Surgeon: Barry Frame, MD;  [facility];  Service: Gastroenterology;  Laterality: N/A;  1030   ESOPHAGOGASTRODUODENOSCOPY (EGD) WITH PROPOFOL N/A 01/01/2023   Procedure: ESOPHAGOGASTRODUODENOSCOPY (EGD) WITH PROPOFOL;  Surgeon: Barry Frame, MD;  [facility];  Service: Gastroenterology;  Laterality: N/A;  9:00AM;asa 3   HAND SURGERY     Right   LUMBAR LAMINECTOMY/DECOMPRESSION MICRODISCECTOMY N/A 08/29/2012   Procedure: LUMBAR LAMINECTOMY/DECOMPRESSION MICRODISCECTOMY 1 LEVEL;  Surgeon: Karn Cassis, MD;  [facility]: [facility];  Service: Neurosurgery;  Laterality: N/A;  Lumbar four-five laminectomies   NECK SURGERY     PENILE PROSTHESIS IMPLANT     PENILE PROSTHESIS IMPLANT N/A 08/07/2021   Procedure: PENILE PROTHESIS INFLATABLE;  Surgeon: Malen Gauze, MD;  Location: [facility];  Service: Urology;  Laterality: N/A;   POLYPECTOMY  11/28/2021   Procedure: POLYPECTOMY;  Surgeon: Barry Frame, MD;  [facility];  Service: Gastroenterology;;   REMOVAL OF PENILE PROSTHESIS N/A 08/07/2021   Procedure: REMOVAL OF PENILE PROSTHESIS;  Surgeon: Malen Gauze, MD;  Location: [facility];  Service: Urology;  Laterality: N/A;   ROTATOR CUFF REPAIR     Right   SHOULDER SURGERY     TRANSURETHRAL RESECTION OF PROSTATE N/A 05/25/2019   Procedure: TRANSURETHRAL RESECTION OF THE PROSTATE (TURP);  Surgeon: Malen Gauze, MD;  Location: [facility];  Service: Urology;  Laterality: N/A;   WRIST SURGERY     Left   WRIST SURGERY      Family History: Family History  Problem Relation Age of Onset   Heart attack Father 62       MI   Hypertension Father    Heart attack Brother 81       MI   Diabetes Mother    Heart attack Paternal Uncle     Social History: Social History   Tobacco Use  Smoking Status Never  Smokeless Tobacco Never   Social History   Substance and Sexual Activity  Alcohol Use Not Currently   Comment: occasionally    Social History   Substance and Sexual Activity  Drug Use Yes   Frequency:  3.0 times per week   Types: Marijuana   Comment: last on 11/26/21    Allergies: Allergies  Allergen Reactions   Aspirin Other (See Comments)    Higher dosage upsets stomach   Bayer Aspirin [Aspirin] Other (See Comments)    Stomach "flares up"    Medications: Current Outpatient Medications  Medication Sig Dispense Refill   acyclovir (ZOVIRAX) 200 MG capsule Take 200 mg by mouth as needed.     amLODipine (NORVASC) 10 MG tablet Take 10 mg by mouth daily.     aspirin EC 81 MG tablet Take 81 mg by mouth daily. Swallow whole.     Bismuth 262 MG CHEW Chew 2 each by mouth every 6 (six) hours. 112 tablet 0   Blood Glucose Monitoring Suppl (ONETOUCH VERIO FLEX SYSTEM) w/Device KIT USE TO TEST TWICE DAILY      buprenorphine (BUTRANS) 15 MCG/HR Place onto the skin once a week.     cetirizine (ZYRTEC) 10 MG tablet Take 10 mg by mouth daily.     diclofenac Sodium (VOLTAREN) 1 % GEL Apply 4 g topically 4 (four) times daily. 150 g 0   dicyclomine (BENTYL) 10 MG capsule Take 1 capsule (10 mg total) by mouth every 12 (twelve) hours as needed (abdominal pain). 60 capsule 2   gabapentin (NEURONTIN) 300 MG capsule Take 300 mg by mouth 2 (two) times daily.     glyBURIDE-metformin (GLUCOVANCE) 5-500 MG per tablet Take 2 tablets by mouth 2 (two) times daily with a meal.     hydrochlorothiazide (HYDRODIURIL) 12.5 MG tablet Take 12.5 mg by mouth daily.     JARDIANCE 25 MG TABS tablet Take 25 mg by mouth daily.     labetalol (NORMODYNE) 200 MG tablet Take 200 mg by mouth daily at 6 (six) AM.     Lancets (ONETOUCH DELICA PLUS LANCET33G) MISC USE TO TEST TWICE DAILY     metoprolol succinate (TOPROL-XL) 50 MG 24 hr tablet Take 50 mg by mouth daily. Take with or immediately following a meal.     nystatin (MYCOSTATIN) 100000 UNIT/ML suspension Use as directed 5 mLs (500,000 Units total) in the mouth or throat 4 (four) times daily. Swish and swallow 240 mL 0   omeprazole (PRILOSEC) 40 MG capsule TAKE 1 CAPSULE(40 MG) BY MOUTH DAILY 90 capsule 0   ONETOUCH VERIO test strip USE TO TEST TWICE DAILY     polyethylene glycol powder (GLYCOLAX) 17 GM/SCOOP powder Take 17 g by mouth daily. (Patient taking differently: Take 17 g by mouth as needed.) 850 g 3   prochlorperazine (COMPAZINE) 10 MG tablet Take 1 tablet (10 mg total) by mouth every 8 (eight) hours for 14 days. 42 tablet 0   ramipril (ALTACE) 10 MG capsule Take 10 mg by mouth daily.      simvastatin (ZOCOR) 20 MG tablet Take 20 mg by mouth daily.     tamsulosin (FLOMAX) 0.4 MG CAPS capsule Take 0.4 mg by mouth daily.     No current facility-administered medications for this visit.    Review of Systems: GENERAL: negative for malaise, night sweats HEENT: No changes in  hearing or vision, no nose bleeds or other nasal problems. NECK: Negative for lumps, goiter, pain and significant neck swelling RESPIRATORY: Negative for cough, wheezing CARDIOVASCULAR: Negative for chest pain, leg swelling, palpitations, orthopnea GI: SEE HPI MUSCULOSKELETAL: Negative for joint pain or swelling, back pain, and muscle pain. SKIN: Negative for lesions, rash PSYCH: Negative for sleep disturbance, mood disorder and recent  psychosocial stressors. HEMATOLOGY Negative for prolonged bleeding, bruising easily, and swollen nodes. ENDOCRINE: Negative for cold or heat intolerance, polyuria, polydipsia and goiter. NEURO: negative for tremor, gait imbalance, syncope and seizures. The remainder of the review of systems is noncontributory.   Physical Exam: BP (!) 92/52 (BP Location: Left Arm, Patient Position: Sitting, Cuff Size: Normal)   Pulse 64   Temp 97.8 F (36.6 C) (Oral)   Ht 5\' 10"  (1.778 m)   Wt 129 lb (58.5 kg)   BMI 18.51 kg/m  GENERAL: The patient is AO x3, in no acute distress. Underweight. HEENT: Head is normocephalic and atraumatic. EOMI are intact. Mouth is well hydrated and without lesions.  I did not identify any inflammation or thrush. NECK: Supple. No masses LUNGS: Clear to auscultation. No presence of rhonchi/wheezing/rales. Adequate chest expansion HEART: RRR, normal s1 and s2. ABDOMEN: Soft, nontender, no guarding, no peritoneal signs, and nondistended. BS +. No masses. EXTREMITIES: Without any cyanosis, clubbing, rash, lesions or edema. NEUROLOGIC: AOx3, no focal motor deficit. SKIN: no jaundice, no rashes  Imaging/Labs: as above  I personally reviewed and interpreted the available labs, imaging and endoscopic files.  Impression and Plan: TEAGEN MCLEARY is a 81 y.o. male with past medical history of GERD, diabetes, Schatzki's ring, hypertension, opioid-induced constipation, esophageal dysmotility, hyperlipidemia and OSA  who presents for follow up  of burning in his mouth and H. pylori infection.  Patient has persistent burning sensation despite receiving different antimycotic regimens.  He currently does not have any abnormalities in the inspection of his oral cavity.  He has not presented any improvement with the use of PPI regularly.  He may were not further evaluation by ENT to determine any other causes of burning sensation in his mouth.  Regarding his H. pylori, will need to confirm if he has achieved eradication of this with a breath test, we will schedule repeat H. pylori breath test 2 weeks of PPI.  Finally, I am concerned about his weight loss.  He has presented as stated Despite having adequate oral intake.  We will evaluate this further with a CT of the neck, chest, abdomen and pelvis with IV contrast.  I encouraged the patient to follow-up with his PCP regarding further evaluation of this as well.  -Referral to ENT -Stop omeprazole for 2 weeks and then perform breath test -Schedule CT neck, chest, abdomen and pelvis with IV contrast  All questions were answered.      Barry Blazing, MD Gastroenterology and Hepatology [facility]

## 2023-02-13 ENCOUNTER — Telehealth (INDEPENDENT_AMBULATORY_CARE_PROVIDER_SITE_OTHER): Payer: Self-pay | Admitting: Gastroenterology

## 2023-02-13 NOTE — Telephone Encounter (Signed)
Last office visit faxed to [facility]. Fax from [place] stating they have not recevied supporting information that shows pt needs CT Chest/Abd/Neck.

## 2023-02-13 NOTE — Telephone Encounter (Signed)
CT has been denied. Denied because one of the following must be met:  you have signs or symptoms of disease involving the boyd areas your doctor wants to image. You were found to have cancer in the past in the body areas your doctor wants to image. Your records do not show that results of a thorough work up failed to reveal the cause of your weight loss. This workup may involve blood tests that include hormone levels, tests that look for blood in your stool and a detailed exam of  your nervous system. You must have abnormal results of a chest xray. We need to see results of laboratory studies that show this study is needed .  We can ask for appeal within 60 days of notice.

## 2023-02-13 NOTE — Telephone Encounter (Signed)
Pt contacted and informed that insurance denied CT.  [employer] medicare number for pt to call to see if he had any luck with getting approval.

## 2023-02-13 NOTE — Telephone Encounter (Signed)
Thanks Tanya. Please let the patient know about the denial from the insurance and asked him to follow-up with his PCP as the PCP will need to perform further workup and possibly order this imaging if the workup is unremarkable.

## 2023-02-19 ENCOUNTER — Encounter (INDEPENDENT_AMBULATORY_CARE_PROVIDER_SITE_OTHER): Payer: Self-pay | Admitting: *Deleted

## 2023-02-22 ENCOUNTER — Other Ambulatory Visit (INDEPENDENT_AMBULATORY_CARE_PROVIDER_SITE_OTHER): Payer: Self-pay

## 2023-02-22 ENCOUNTER — Encounter (INDEPENDENT_AMBULATORY_CARE_PROVIDER_SITE_OTHER): Payer: Self-pay

## 2023-02-22 DIAGNOSIS — K2970 Gastritis, unspecified, without bleeding: Secondary | ICD-10-CM

## 2023-02-25 ENCOUNTER — Ambulatory Visit (INDEPENDENT_AMBULATORY_CARE_PROVIDER_SITE_OTHER): Payer: Medicare HMO | Admitting: Gastroenterology

## 2023-02-27 LAB — H. PYLORI BREATH TEST

## 2023-03-04 ENCOUNTER — Ambulatory Visit (INDEPENDENT_AMBULATORY_CARE_PROVIDER_SITE_OTHER): Payer: Medicare HMO | Admitting: Urology

## 2023-03-04 ENCOUNTER — Telehealth (INDEPENDENT_AMBULATORY_CARE_PROVIDER_SITE_OTHER): Payer: Self-pay | Admitting: Gastroenterology

## 2023-03-04 VITALS — BP 101/51 | HR 60

## 2023-03-04 DIAGNOSIS — T83490A Other mechanical complication of penile (implanted) prosthesis, initial encounter: Secondary | ICD-10-CM | POA: Diagnosis not present

## 2023-03-04 DIAGNOSIS — R634 Abnormal weight loss: Secondary | ICD-10-CM

## 2023-03-04 NOTE — Telephone Encounter (Signed)
Can we send a CXR and a CT abdomen and pelvis with IV contrast? Dx: weight loss Thanks

## 2023-03-04 NOTE — Telephone Encounter (Signed)
Pt came into office and was upset that insurance has denied the CT chest/abd/pelvis. Advised pt that he would need to follow up with PCP to get more testing completed. Pt states he just left his PCP and they told him to come here. Pt would like an appeal with insurance.  (For Chest CT pt has to have abnormal chest xray) Please advise. Thank you.

## 2023-03-04 NOTE — Progress Notes (Unsigned)
03/04/2023 9:17 AM   [name] Oct 17, 1940 [id number]  Referring provider: Benetta Spar, MD [address] [place],  [place] [address]  Malfunctioning penile prosthesis   HPI: [name] is a 81yo here for followup for erectile dysfunction. For the past 3 months he has noticed increased difficulty inflating the device and then the cylinders lose their rigidity. No pain with erections   PMH: Past Medical History:  Diagnosis Date   Arthritis    fingers, back   Chronic back pain    Diabetes mellitus Since 1995   Takes Glucovance and Onglyza daily   Diabetes mellitus without complication (HCC)    GERD (gastroesophageal reflux disease)    takes Dexilant and Omeprazole daily   Herpes    History of colon polyps    History of gastric ulcer 25+yrs ago   Hyperlipidemia Since 1995   takes Simvastatin daily   Hypertension Since 1995   takes Metoprolol and Ramipril daily   Hypertension    Pneumonia    hx of;as a child   Sleep apnea    doesn't use CPAP   Urinary frequency    Urinary urgency    takes Flomax daily    Surgical History: Past Surgical History:  Procedure Laterality Date   BACK SURGERY  1962   Lumbar   BACK SURGERY     bilateral cataract surgery     BIOPSY  02/23/2022   Procedure: BIOPSY;  Surgeon: Dolores Frame, MD;  [facility];  Service: Gastroenterology;;   BIOPSY  01/01/2023   Procedure: BIOPSY;  Surgeon: Dolores Frame, MD;  [facility];  Service: Gastroenterology;;   CARDIAC CATHETERIZATION  02/16/10   CERVICAL SPINE SURGERY     COLONOSCOPY     COLONOSCOPY N/A 04/23/2013   Procedure: COLONOSCOPY;  Surgeon: Malissa Hippo, MD;  [facility];  Service: Endoscopy;  Laterality: N/A;  1030   COLONOSCOPY N/A 07/24/2018   Procedure: COLONOSCOPY;  Surgeon: Malissa Hippo, MD;  [facility];  Service: Endoscopy;  Laterality: N/A;  8:30   COLONOSCOPY WITH PROPOFOL  N/A 11/28/2021   Procedure: COLONOSCOPY WITH PROPOFOL;  Surgeon: Dolores Frame, MD;  [facility];  Service: Gastroenterology;  Laterality: N/A;  205   ESOPHAGEAL DILATION N/A 02/10/2020   Procedure: ESOPHAGEAL DILATION;  Surgeon: Malissa Hippo, MD;  [facility];  Service: Endoscopy;  Laterality: N/A;   ESOPHAGOGASTRODUODENOSCOPY N/A 12/24/2014   Procedure: ESOPHAGOGASTRODUODENOSCOPY (EGD);  Surgeon: Malissa Hippo, MD;  [facility];  Service: Endoscopy;  Laterality: N/A;  2:45   ESOPHAGOGASTRODUODENOSCOPY (EGD) WITH ESOPHAGEAL DILATION     ESOPHAGOGASTRODUODENOSCOPY (EGD) WITH PROPOFOL N/A 02/10/2020   Procedure: ESOPHAGOGASTRODUODENOSCOPY (EGD) WITH PROPOFOL;  Surgeon: Malissa Hippo, MD;  [facility];  Service: Endoscopy;  Laterality: N/A;  250   ESOPHAGOGASTRODUODENOSCOPY (EGD) WITH PROPOFOL N/A 02/23/2022   Procedure: ESOPHAGOGASTRODUODENOSCOPY (EGD) WITH PROPOFOL;  Surgeon: Dolores Frame, MD;  [facility];  Service: Gastroenterology;  Laterality: N/A;  1030   ESOPHAGOGASTRODUODENOSCOPY (EGD) WITH PROPOFOL N/A 01/01/2023   Procedure: ESOPHAGOGASTRODUODENOSCOPY (EGD) WITH PROPOFOL;  Surgeon: Dolores Frame, MD;  [facility];  Service: Gastroenterology;  Laterality: N/A;  9:00AM;asa 3   HAND SURGERY     Right   LUMBAR LAMINECTOMY/DECOMPRESSION MICRODISCECTOMY N/A 08/29/2012   Procedure: LUMBAR LAMINECTOMY/DECOMPRESSION MICRODISCECTOMY 1 LEVEL;  Surgeon: Karn Cassis, MD;  [facility];  Service: Neurosurgery;  Laterality: N/A;  Lumbar four-five laminectomies   NECK SURGERY     PENILE PROSTHESIS IMPLANT     PENILE PROSTHESIS IMPLANT N/A 08/07/2021   Procedure: PENILE PROTHESIS INFLATABLE;  Surgeon: Malen Gauze, MD;  Location: [facility];  Service: Urology;  Laterality: N/A;   POLYPECTOMY  11/28/2021   Procedure: POLYPECTOMY;  Surgeon: Dolores Frame, MD;  [facility];  Service: Gastroenterology;;   REMOVAL OF PENILE PROSTHESIS N/A 08/07/2021   Procedure: REMOVAL OF PENILE PROSTHESIS;  Surgeon: Malen Gauze, MD;  Location: [facility];  Service: Urology;  Laterality: N/A;   ROTATOR CUFF REPAIR     Right   SHOULDER SURGERY     TRANSURETHRAL RESECTION OF PROSTATE N/A 05/25/2019   Procedure: TRANSURETHRAL RESECTION OF THE PROSTATE (TURP);  Surgeon: Malen Gauze, MD;  Location: [facility];  Service: Urology;  Laterality: N/A;   WRIST SURGERY     Left   WRIST SURGERY      Home Medications:  Allergies as of 03/04/2023       Reactions   Aspirin Other (See Comments)   Higher dosage upsets stomach   Bayer Aspirin [aspirin] Other (See Comments)   Stomach "flares up"        Medication List        Accurate as of March 04, 2023  9:17 AM. If you have any questions, ask your nurse or doctor.          acyclovir 200 MG capsule Commonly known as: ZOVIRAX Take 200 mg by mouth as needed.   amLODipine 10 MG tablet Commonly known as: NORVASC Take 10 mg by mouth daily.   aspirin EC 81 MG tablet Take 81 mg by mouth daily. Swallow whole.   Bismuth 262 MG Chew Chew 2 each by mouth every 6 (six) hours.   buprenorphine 15 MCG/HR Commonly known as: BUTRANS Place onto the skin once a week.   cetirizine 10 MG tablet Commonly known as: ZYRTEC Take 10 mg by mouth daily.   diclofenac Sodium 1 % Gel Commonly known as: Voltaren Apply 4 g topically 4 (four) times daily.   dicyclomine 10 MG capsule Commonly known as: BENTYL Take 1 capsule (10 mg total) by mouth every 12 (twelve) hours as needed (abdominal pain).   gabapentin 300 MG capsule Commonly known as: NEURONTIN Take 300 mg by mouth 2 (two) times daily.   glyBURIDE-metformin 5-500 MG tablet Commonly known as: GLUCOVANCE Take 2 tablets by mouth 2 (two) times daily with a meal.   hydrochlorothiazide 12.5 MG tablet Commonly known as: HYDRODIURIL Take 12.5 mg by mouth  daily.   Jardiance 25 MG Tabs tablet Generic drug: empagliflozin Take 25 mg by mouth daily.   labetalol 200 MG tablet Commonly known as: NORMODYNE Take 200 mg by mouth daily at 6 (six) AM.   metoprolol succinate 50 MG 24 hr tablet Commonly known as: TOPROL-XL Take 50 mg by mouth daily. Take with or immediately following a meal.   nystatin 100000 UNIT/ML suspension Commonly known as: MYCOSTATIN Use as directed 5 mLs (500,000 Units total) in the mouth or throat 4 (four) times daily. Swish and swallow   omeprazole 40 MG capsule Commonly known as: PRILOSEC TAKE 1 CAPSULE(40 MG) BY MOUTH DAILY   OneTouch Delica Plus Lancet33G Misc USE TO TEST TWICE DAILY   OneTouch Verio Flex System w/Device Kit USE TO TEST TWICE DAILY   OneTouch Verio test strip Generic drug: glucose blood USE TO TEST TWICE DAILY  polyethylene glycol powder 17 GM/SCOOP powder Commonly known as: GlycoLax Take 17 g by mouth daily. What changed:  when to take this reasons to take this   prochlorperazine 10 MG tablet Commonly known as: COMPAZINE Take 1 tablet (10 mg total) by mouth every 8 (eight) hours for 14 days.   ramipril 10 MG capsule Commonly known as: ALTACE Take 10 mg by mouth daily.   simvastatin 20 MG tablet Commonly known as: ZOCOR Take 20 mg by mouth daily.   tamsulosin 0.4 MG Caps capsule Commonly known as: FLOMAX Take 0.4 mg by mouth daily.        Allergies:  Allergies  Allergen Reactions   Aspirin Other (See Comments)    Higher dosage upsets stomach   Bayer Aspirin [Aspirin] Other (See Comments)    Stomach "flares up"    Family History: Family History  Problem Relation Age of Onset   Heart attack Father 18       MI   Hypertension Father    Heart attack Brother 45       MI   Diabetes Mother    Heart attack Paternal Uncle     Social History:  reports that he has never smoked. He has never used smokeless tobacco. He reports that he does not currently use alcohol.  He reports current drug use. Frequency: 3.00 times per week. Drug: Marijuana.  ROS: All other review of systems were reviewed and are negative except what is noted above in HPI  Physical Exam: BP (!) 101/51   Pulse 60   Constitutional:  Alert and oriented, No acute distress. HEENT: [place] AT, moist mucus membranes.  Trachea midline, no masses. Cardiovascular: No clubbing, cyanosis, or edema. Respiratory: Normal respiratory effort, no increased work of breathing. GI: Abdomen is soft, nontender, nondistended, no abdominal masses GU: No CVA tenderness.  Lymph: No cervical or inguinal lymphadenopathy. Skin: No rashes, bruises or suspicious lesions. Neurologic: Grossly intact, no focal deficits, moving all 4 extremities. Psychiatric: Normal mood and affect.  Laboratory Data: Lab Results  Component Value Date   WBC 3.2 (L) 12/25/2022   HGB 10.9 (L) 12/25/2022   HCT 33.7 (L) 12/25/2022   MCV 93.4 12/25/2022   PLT 209 12/25/2022    Lab Results  Component Value Date   CREATININE 0.90 12/25/2022    No results found for: "PSA"  No results found for: "TESTOSTERONE"  Lab Results  Component Value Date   HGBA1C 8.5 (H) 08/04/2021    Urinalysis    Component Value Date/Time   COLORURINE YELLOW 12/25/2022 0111   APPEARANCEUR CLEAR 12/25/2022 0111   APPEARANCEUR Clear 09/25/2021 1121   LABSPEC 1.011 12/25/2022 0111   PHURINE 5.0 12/25/2022 0111   GLUCOSEU 150 (A) 12/25/2022 0111   HGBUR SMALL (A) 12/25/2022 0111   BILIRUBINUR NEGATIVE 12/25/2022 0111   BILIRUBINUR Negative 09/25/2021 1121   KETONESUR NEGATIVE 12/25/2022 0111   PROTEINUR NEGATIVE 12/25/2022 0111   UROBILINOGEN 0.2 11/04/2019 1451   UROBILINOGEN 0.2 05/15/2014 0122   NITRITE NEGATIVE 12/25/2022 0111   LEUKOCYTESUR NEGATIVE 12/25/2022 0111    Lab Results  Component Value Date   LABMICR See below: 09/25/2021   WBCUA 0-5 09/25/2021   LABEPIT 0-10 09/25/2021   MUCUS Present 09/25/2021   BACTERIA NONE SEEN  12/25/2022    Pertinent Imaging: *** No results found for this or any previous visit.  No results found for this or any previous visit.  No results found for this or any previous visit.  No results found  for this or any previous visit.  No results found for this or any previous visit.  No valid procedures specified. No results found for this or any previous visit.  Results for orders placed during the hospital encounter of 09/20/17  CT Renal Stone Study  Narrative CLINICAL DATA:  No new urinary tract stone. Low back pain for 2 weeks. Urinary retention and dysuria.  EXAM: CT ABDOMEN AND PELVIS WITHOUT CONTRAST  TECHNIQUE: Multidetector CT imaging of the abdomen and pelvis was performed following the standard protocol without IV contrast.  COMPARISON:  CT of the abdomen and pelvis 01/25/2012. Lumbar spine radiographs 04/24/2017.  FINDINGS: Lower chest: Excellent recall bronchiectasis is again noted at the lung bases bilaterally without superimposed airspace disease. The heart size is normal. No significant pleural or pericardial effusion is present.  Hepatobiliary: No focal liver abnormality is seen. No gallstones, gallbladder wall thickening, or biliary dilatation.  Pancreas: Unremarkable. No pancreatic ductal dilatation or surrounding inflammatory changes.  Spleen: Calcification along the lateral margin of the spleen is stable. No focal lesions are evident.  Adrenals/Urinary Tract: Adrenal glands are normal bilaterally. No stone or mass lesion is present. There is no obstruction. The ureters are within normal limits bilaterally. The urinary bladder is within normal limits.  Stomach/Bowel: The stomach and duodenum are within normal limits. Small bowel is unremarkable. The terminal ileum is within normal limits. The appendix is visualized and normal.  Vascular/Lymphatic: Atherosclerotic calcifications are present without aneurysm.  Reproductive: A penile  prosthesis is noted. The prostate gland is somewhat prominent and indents the inferior margin of the urinary bladder. It measures 4 cm in transverse diameter.  Other: No abdominal wall hernia or abnormality. No abdominopelvic ascites.  Musculoskeletal: Progressive endplate degenerative changes and sclerosis are present at L3-4, L4-5, and L5-S1. A transitional S1 segment is again noted. Foraminal narrowing is evident bilaterally at L4-5 and L5-S1.  IMPRESSION: 1. No urinary tract obstruction or stone. 2. Prominent prostate gland indents the inferior border of the urinary bladder. 3. progressive degenerative changes in the lower lumbar spine. 4. Penile prosthesis.   Electronically Signed By: Marin Roberts [name] On: 09/20/2017 16:28   Assessment & Plan:    1. Malfunction of penile prosthesis, initial encounter (HCC) Device cycled in the office today and cycled correctly. Patient to followup in 1year   No follow-ups on file.  Wilkie Aye, MD  [facility] [place]

## 2023-03-06 ENCOUNTER — Encounter: Payer: Self-pay | Admitting: Urology

## 2023-03-06 NOTE — Patient Instructions (Signed)

## 2023-03-07 NOTE — Telephone Encounter (Signed)
Authorization Number: NA Case Number: [id number]     Patient Name: [name], [name] DOB: [birth year] Status: Expired / Cancelled Reason : Not Available P2P Eligibility Result: A final decision has not yet been rendered on this case OR it requires special handling. If you have received a request for additional clinical information, please respond to our notice per the instructions received. If you would like to understand additional options available, please contact our Physician Support Unit at [phone], option 1 P2P Status:  Approval Date:  Service Code: [id number] Service Description: CT ABDOMEN & PELVIS [facility]  Start Date: 03/07/2023 Expiration Date:  Date Last Updated: 03/07/2023 10:10:44 AM    Unable to complete PA at this time due to one being on file already. Chest xray order placed.

## 2023-03-07 NOTE — Addendum Note (Signed)
Addended by: Marlowe Shores on: 03/07/2023 10:13 AM   Modules accepted: Orders

## 2023-03-13 NOTE — Telephone Encounter (Signed)
error 

## 2023-03-14 ENCOUNTER — Ambulatory Visit (HOSPITAL_COMMUNITY)
Admission: RE | Admit: 2023-03-14 | Discharge: 2023-03-14 | Disposition: A | Discharge status: Home / Self Care | Payer: Medicare HMO | Source: Ambulatory Visit | Attending: Gastroenterology | Admitting: Gastroenterology

## 2023-03-14 DIAGNOSIS — R634 Abnormal weight loss: Secondary | ICD-10-CM | POA: Insufficient documentation

## 2023-03-27 ENCOUNTER — Telehealth (INDEPENDENT_AMBULATORY_CARE_PROVIDER_SITE_OTHER): Payer: Self-pay | Admitting: Gastroenterology

## 2023-03-27 NOTE — Telephone Encounter (Signed)
I spoke with the insurance, who reported that as the initial orders were not approved, the patient will need to have an appeal of the first order that was denied or we could wait 60 days from the initial denial.  This will mean that we could wait until the first week of October and send a new order for a CT of the abdomen and pelvis with IV contrast.  Tanya, please set a reminder to reorder a CT of the abdomen and pelvis with IV contrast for weight loss and in the first week of October.  Ann, please send a message to Dr. Felecia Shelling (PCP) stating that the patient has lost significant amount of weight and other causes of weight loss should be evaluated by him.

## 2023-03-28 ENCOUNTER — Encounter (INDEPENDENT_AMBULATORY_CARE_PROVIDER_SITE_OTHER): Payer: Self-pay | Admitting: *Deleted

## 2023-03-28 NOTE — Telephone Encounter (Signed)
Letter sent to Dr. Felecia Shelling.

## 2023-04-09 ENCOUNTER — Other Ambulatory Visit (INDEPENDENT_AMBULATORY_CARE_PROVIDER_SITE_OTHER): Payer: Self-pay | Admitting: Gastroenterology

## 2023-04-09 DIAGNOSIS — R634 Abnormal weight loss: Secondary | ICD-10-CM

## 2023-04-17 NOTE — Telephone Encounter (Signed)
Additional info upload on [employer] for CT

## 2023-04-23 NOTE — Telephone Encounter (Signed)
Fax from [place] stating CT is denied. Denied due to Imaging can be done if your weight loss has been over the past six months or less without a known reason. The notes sent to [place] do no show this applies to you. Imaging can be done when results of a thorough workup such as hormone levels, stool testing, and a detailed nervous system exam) fail to reveal the cause of your weight loss. Denial placed on provider desk.

## 2023-04-23 NOTE — Telephone Encounter (Signed)
Please let the patient know about this denial. He will need to follow with his PCP regarding weight loss and discuss further testing before we can proceed with reordering this CT scan. Thanks

## 2023-05-03 ENCOUNTER — Ambulatory Visit: Payer: Medicare HMO | Admitting: Urology

## 2023-05-03 NOTE — Telephone Encounter (Signed)
[name] (spelling?) from [place] left message that she is calling in regards to an appeal for a denied CT  Left message for [name] to return call

## 2023-05-08 ENCOUNTER — Ambulatory Visit: Payer: Medicare HMO | Admitting: Urology

## 2023-05-14 ENCOUNTER — Ambulatory Visit (INDEPENDENT_AMBULATORY_CARE_PROVIDER_SITE_OTHER): Payer: Medicare HMO | Admitting: Gastroenterology

## 2023-05-14 ENCOUNTER — Encounter (INDEPENDENT_AMBULATORY_CARE_PROVIDER_SITE_OTHER): Payer: Self-pay | Admitting: Gastroenterology

## 2023-05-14 ENCOUNTER — Other Ambulatory Visit (INDEPENDENT_AMBULATORY_CARE_PROVIDER_SITE_OTHER): Payer: Self-pay | Admitting: *Deleted

## 2023-05-14 VITALS — BP 137/68 | HR 78 | Temp 97.8°F | Ht 70.0 in | Wt 133.6 lb

## 2023-05-14 DIAGNOSIS — R208 Other disturbances of skin sensation: Secondary | ICD-10-CM

## 2023-05-14 DIAGNOSIS — K2970 Gastritis, unspecified, without bleeding: Secondary | ICD-10-CM | POA: Diagnosis not present

## 2023-05-14 DIAGNOSIS — B370 Candidal stomatitis: Secondary | ICD-10-CM

## 2023-05-14 DIAGNOSIS — R634 Abnormal weight loss: Secondary | ICD-10-CM

## 2023-05-14 DIAGNOSIS — B9681 Helicobacter pylori [H. pylori] as the cause of diseases classified elsewhere: Secondary | ICD-10-CM | POA: Diagnosis not present

## 2023-05-14 DIAGNOSIS — R109 Unspecified abdominal pain: Secondary | ICD-10-CM

## 2023-05-14 DIAGNOSIS — K146 Glossodynia: Secondary | ICD-10-CM | POA: Diagnosis not present

## 2023-05-14 DIAGNOSIS — K219 Gastro-esophageal reflux disease without esophagitis: Secondary | ICD-10-CM

## 2023-05-14 MED ORDER — NYSTATIN 100000 UNIT/ML MT SUSP: 5.0000 mL | Freq: Three times a day (TID) | OROMUCOSAL | 0 refills | Status: DC | PRN

## 2023-05-14 NOTE — Progress Notes (Addendum)
Referring Provider: Benetta Spar* Primary Care Physician:  Benetta Spar, MD Primary GI Physician: Dr. Levon Hedger   Chief Complaint  Patient presents with   Abdominal Pain    Patient here today for a follow up on abdominal pain. He says he is still having issues with mid abdominal pain. He says he has sores on his tongue.   HPI:   [name] is a 82 y.o. male with past medical history of GERD, diabetes, Schatzki's ring, hypertension, opioid-induced constipation, esophageal dysmotility, hyperlipidemia and OSA   Patient presenting today for follow up of H pylori gastritis, abdominal pain and burning to his mouth   Patient last seen in August 2024, at that time patient reported persistent burning in his mouth despite finishing medication regimen for Candida.  Also having burning sensation in his chest and abdomen.  Appetite has been good but eating slightly less than in the past.  Patient recommended referral to ENT, stop omeprazole for 2 weeks and perform H. pylori breath test to confirm eradication, schedule CT neck chest abdomen and pelvis with IV contrast for weight loss.  Unfortunately CT was not approved by patient's insurance, he was recommended to follow up with PCP regarding weight loss.  H pylori test was cancelled as patient did not collect enough sample to perform the test.  Present:  Weight 133 lbs today, 129lbs in August, appears weight has been around 133 since [place] on 10/2- Dr. Marene Lenz, states he was given something to spray in his nose and is having a procedure done in the next few weeks. Laryngoscopy performed showed some reflux changes. Was recommended to start pepcid with his PPI. He continues to have burning to his tongue and notes that the sides stay white.  He continues to have periumbilical abdominal pain that comes and goes. Notes a pulling sensation when it occurs.  Has occasional heartburn, unable to pinpoint  how often. Appetite is good. He states he cannot seem to gain any weight. Has not seen PCP about getting CT that was denied when we ordered it.   Denies rectal bleeding or melena. One episode of vomiting last week but no further nausea or vomiting.   Last EGD: 01/01/2023 - Normal esophagus. - Gastric mucosal atrophy. Biopsied. - Normal examined duodenum. Pathology showed H. Pylori gastritis.  Last Colonoscopy:11/28/2021 The perianal and digital rectal examinations were normal. Six sessile polyps were found in the ascending colon. The polyps were 2 to 6 mm in size. These polyps were removed with a cold snare. Resection and retrieval were complete for 3 polyps, but the other 3 could not be retrieved (fully resected). The ascending colon and cecum appeared normal othwerwise. CT scan findings likely related to stool. Non-bleeding internal hemorrhoids were found during retroflexion. The hemorrhoids were small. Pathology consistent with tubular adenomas.   Not Recommended to have a repeat colonoscopy given patient comorbidities.  Past Medical History:  Diagnosis Date   Arthritis    fingers, back   Chronic back pain    Diabetes mellitus Since 1995   Takes Glucovance and Onglyza daily   Diabetes mellitus without complication (HCC)    GERD (gastroesophageal reflux disease)    takes Dexilant and Omeprazole daily   Herpes    History of colon polyps    History of gastric ulcer 25+yrs ago   Hyperlipidemia Since 1995   takes Simvastatin daily   Hypertension Since 1995   takes Metoprolol and Ramipril daily   Hypertension  Pneumonia    hx of;as a child   Sleep apnea    doesn't use CPAP   Urinary frequency    Urinary urgency    takes Flomax daily    Past Surgical History:  Procedure Laterality Date   BACK SURGERY  1962   Lumbar   BACK SURGERY     bilateral cataract surgery     BIOPSY  02/23/2022   Procedure: BIOPSY;  Surgeon: Dolores Frame, MD;  [facility];   Service: Gastroenterology;;   BIOPSY  01/01/2023   Procedure: BIOPSY;  Surgeon: Dolores Frame, MD;  [facility];  Service: Gastroenterology;;   CARDIAC CATHETERIZATION  02/16/10   CERVICAL SPINE SURGERY     COLONOSCOPY     COLONOSCOPY N/A 04/23/2013   Procedure: COLONOSCOPY;  Surgeon: Malissa Hippo, MD;  [facility]: [facility];  Service: Endoscopy;  Laterality: N/A;  1030   COLONOSCOPY N/A 07/24/2018   Procedure: COLONOSCOPY;  Surgeon: Malissa Hippo, MD;  [facility];  Service: Endoscopy;  Laterality: N/A;  8:30   COLONOSCOPY WITH PROPOFOL N/A 11/28/2021   Procedure: COLONOSCOPY WITH PROPOFOL;  Surgeon: Dolores Frame, MD;  [facility];  Service: Gastroenterology;  Laterality: N/A;  205   ESOPHAGEAL DILATION N/A 02/10/2020   Procedure: ESOPHAGEAL DILATION;  Surgeon: Malissa Hippo, MD;  [facility];  Service: Endoscopy;  Laterality: N/A;   ESOPHAGOGASTRODUODENOSCOPY N/A 12/24/2014   Procedure: ESOPHAGOGASTRODUODENOSCOPY (EGD);  Surgeon: Malissa Hippo, MD;  [facility];  Service: Endoscopy;  Laterality: N/A;  2:45   ESOPHAGOGASTRODUODENOSCOPY (EGD) WITH ESOPHAGEAL DILATION     ESOPHAGOGASTRODUODENOSCOPY (EGD) WITH PROPOFOL N/A 02/10/2020   Procedure: ESOPHAGOGASTRODUODENOSCOPY (EGD) WITH PROPOFOL;  Surgeon: Malissa Hippo, MD;  [facility];  Service: Endoscopy;  Laterality: N/A;  250   ESOPHAGOGASTRODUODENOSCOPY (EGD) WITH PROPOFOL N/A 02/23/2022   Procedure: ESOPHAGOGASTRODUODENOSCOPY (EGD) WITH PROPOFOL;  Surgeon: Dolores Frame, MD;  [facility];  Service: Gastroenterology;  Laterality: N/A;  1030   ESOPHAGOGASTRODUODENOSCOPY (EGD) WITH PROPOFOL N/A 01/01/2023   Procedure: ESOPHAGOGASTRODUODENOSCOPY (EGD) WITH PROPOFOL;  Surgeon: Dolores Frame, MD;  [facility];  Service: Gastroenterology;  Laterality: N/A;  9:00AM;asa 3   HAND SURGERY     Right    LUMBAR LAMINECTOMY/DECOMPRESSION MICRODISCECTOMY N/A 08/29/2012   Procedure: LUMBAR LAMINECTOMY/DECOMPRESSION MICRODISCECTOMY 1 LEVEL;  Surgeon: Karn Cassis, MD;  [facility]: [facility];  Service: Neurosurgery;  Laterality: N/A;  Lumbar four-five laminectomies   NECK SURGERY     PENILE PROSTHESIS IMPLANT     PENILE PROSTHESIS IMPLANT N/A 08/07/2021   Procedure: PENILE PROTHESIS INFLATABLE;  Surgeon: Malen Gauze, MD;  Location: [facility];  Service: Urology;  Laterality: N/A;   POLYPECTOMY  11/28/2021   Procedure: POLYPECTOMY;  Surgeon: Dolores Frame, MD;  [facility];  Service: Gastroenterology;;   REMOVAL OF PENILE PROSTHESIS N/A 08/07/2021   Procedure: REMOVAL OF PENILE PROSTHESIS;  Surgeon: Malen Gauze, MD;  Location: [facility];  Service: Urology;  Laterality: N/A;   ROTATOR CUFF REPAIR     Right   SHOULDER SURGERY     TRANSURETHRAL RESECTION OF PROSTATE N/A 05/25/2019   Procedure: TRANSURETHRAL RESECTION OF THE PROSTATE (TURP);  Surgeon: Malen Gauze, MD;  Location: [facility];  Service: Urology;  Laterality: N/A;   WRIST SURGERY     Left   WRIST SURGERY      Current Outpatient Medications  Medication Sig Dispense Refill   acyclovir (ZOVIRAX) 200 MG capsule Take 200 mg by mouth as needed.     amLODipine (NORVASC) 10 MG tablet Take 10 mg by mouth daily.     aspirin EC 81 MG tablet Take 81 mg by mouth daily. Swallow whole.     Bismuth 262 MG CHEW Chew 2 each by mouth every 6 (six) hours. 112 tablet 0   Blood Glucose Monitoring Suppl (ONETOUCH VERIO FLEX SYSTEM) w/Device KIT USE TO TEST TWICE DAILY     buprenorphine (BUTRANS) 15 MCG/HR Place onto the skin once a week.     cetirizine (ZYRTEC) 10 MG tablet Take 10 mg by mouth daily.     diclofenac Sodium (VOLTAREN) 1 % GEL Apply 4 g topically 4 (four) times daily. 150 g 0   dicyclomine (BENTYL) 10 MG capsule Take 1 capsule (10 mg total) by mouth every 12 (twelve) hours as needed (abdominal pain). 60  capsule 2   gabapentin (NEURONTIN) 300 MG capsule Take 300 mg by mouth 2 (two) times daily.     glyBURIDE-metformin (GLUCOVANCE) 5-500 MG per tablet Take 2 tablets by mouth 2 (two) times daily with a meal.     hydrochlorothiazide (HYDRODIURIL) 12.5 MG tablet Take 12.5 mg by mouth daily.     JARDIANCE 25 MG TABS tablet Take 25 mg by mouth daily.     labetalol (NORMODYNE) 200 MG tablet Take 200 mg by mouth daily at 6 (six) AM.     Lancets (ONETOUCH DELICA PLUS LANCET33G) MISC USE TO TEST TWICE DAILY     metoprolol succinate (TOPROL-XL) 50 MG 24 hr tablet Take 50 mg by mouth daily. Take with or immediately following a meal.     nystatin (MYCOSTATIN) 100000 UNIT/ML suspension Use as directed 5 mLs (500,000 Units total) in the mouth or throat 4 (four) times daily. Swish and swallow 240 mL 0   omeprazole (PRILOSEC) 40 MG capsule TAKE 1 CAPSULE(40 MG) BY MOUTH DAILY 90 capsule 0   ONETOUCH VERIO test strip USE TO TEST TWICE DAILY     polyethylene glycol powder (GLYCOLAX) 17 GM/SCOOP powder Take 17 g by mouth daily. (Patient taking differently: Take 17 g by mouth as needed.) 850 g 3   prochlorperazine (COMPAZINE) 10 MG tablet Take 1 tablet (10 mg total) by mouth every 8 (eight) hours for 14 days. 42 tablet 0   ramipril (ALTACE) 10 MG capsule Take 10 mg by mouth daily.      simvastatin (ZOCOR) 20 MG tablet Take 20 mg by mouth daily.     tamsulosin (FLOMAX) 0.4 MG CAPS capsule Take 0.4 mg by mouth daily.     No current facility-administered medications for this visit.    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    Employer: RETIRED    Comment: Retired  Tobacco Use   Smoking status: Never   Smokeless tobacco: Never  Vaping Use   Vaping status: Never Used  Substance and Sexual Activity   Alcohol use: Not Currently    Comment: occasionally    Drug use: Yes    Frequency: 3.0 times per week    Types: Marijuana    Comment: last on 11/26/21   Sexual activity: Never  Other Topics Concern   Not on file  Social History Narrative   ** Merged History Encounter **       Social Determinants of Health   Financial Resource Strain: Not on file  Food Insecurity: No Food Insecurity (06/12/2022)   Received from [facility], [facility]   Hunger Vital Sign    Worried About Running Out of Food in the Last Year: Never true    Ran Out of Food in the Last Year: Never true  Transportation Needs: Not on file  Physical Activity: Not on file  Stress: Not on file  Social Connections: Unknown (06/05/2022)   Received from [facility], [facility]   Social Network    Social Network: Not on file    Review of systems General: negative for malaise, night sweats, fever, chills +weight loss  Neck: Negative for lumps, goiter, pain and significant neck swelling Resp: Negative for cough, wheezing, dyspnea at rest CV: Negative for chest pain, leg swelling, palpitations, orthopnea GI: denies melena, hematochezia, nausea, vomiting, diarrhea, constipation, dysphagia, odyonophagia, early satiety or unintentional weight loss. +abdominal pain +burning to tongue  The remainder of the review of systems is noncontributory.  Physical Exam: BP 137/68 (BP Location: Left Arm, Patient Position: Sitting, Cuff Size: Normal)   Pulse 78   Temp 97.8 F (36.6 C) (Temporal)   Ht 5\' 10"  (1.778 m)   Wt 133 lb 9.6 oz (60.6 kg)   BMI  19.17 kg/m  General:   Alert and oriented. No distress noted. Pleasant and cooperative.  Head:  Normocephalic and atraumatic. Eyes:  Conjuctiva clear without scleral icterus. Mouth:  no white patches or erythema noted in mouth, geographical tongue Heart: Normal rate and rhythm, s1 and s2 heart sounds present.  Lungs: Clear lung sounds in all lobes. Respirations equal and unlabored. Abdomen:  +BS, soft, non-tender and non-distended. No rebound or guarding. No HSM or masses noted. Neurologic:  Alert and  oriented x4 Psych:  Alert and cooperative. Normal mood and affect.  Invalid input(s): "6 MONTHS"   ASSESSMENT: [name] is a 82 y.o. male presenting today for follow up of H pylori gastritis, abdominal pain and burning to his tongue  H pylori gastritis/abdominal pain: Positive H. pylori testing in July 2024, treated with Flagyl, tetracycline, bismuth. repeat testing done in August to confirm eradication was canceled due to insufficient specimen and patient was lost to follow-up for repeat testing.  He continues to have mid abdominal pain.  Recommend repeating H. pylori breath test, will need to be off PPI x 2 weeks prior to this, can resume omeprazole 40 mg daily and Pepcid 20 mg nightly thereafter  Burning to tongue: Ongoing burning to his tongue.  Has been treated for Candida in the past though symptoms seem to be recurrent despite treatment.  No white patches present in mouth today though he continues to note burning sensation to his mouth.  He has seen ENT though no definitive diagnosis for symptoms.  He should continue to follow with ENT,  will send Magic mouthwash to help with symptomatic relief.  He notably has had significant weight loss over the past year and a half.  Weight relatively stable since last OV.  He was ordered to have CT chest, abdomen, pelvis for further evaluation of weight loss though this was denied by his insurance.  He was advised to follow-up with his PCP  regarding further evaluation of his weight loss though.  Appears per chart review a contact from his insurance company left a message to discuss appeal denial for CT on 10/25, message was returned by our scheduler though does not appear we have heard back from them regarding this.  Will discuss further with our scheduler on any updates for this, however I did advise patient to continue to follow with his PCP regarding his weight loss in the meantime. He is up to date on EGD and colonoscopy.   PLAN:  Hold PPI x2 weeks, then complete H pylori breath test  2. Resume omeprazole 40mg , Pepcid 20mg  at bedtime  3. Follow up on denial for CT  4. Continue to follow with ENT  5. Magic mouthwash TID PRN  All questions were answered, patient verbalized understanding and is in agreement with plan as outlined above.    Follow Up: 3 months   Kazue Cerro L. Jeanmarie Hubert, MSN, APRN, AGNP-C Adult-Gerontology Nurse Practitioner [facility]  I have reviewed the note and agree with the APP's assessment as described in this progress note  Katrinka Blazing, MD Gastroenterology and Hepatology [facility] Gastroenterology

## 2023-05-14 NOTE — Patient Instructions (Signed)
Please stop your omeprazole/pepcid x2 weeks then complete h pylori breath test You can resume your omeprazole and pepcid after breath test completed I have sent magic mouthwash to help with the burning in your mouth Please continue to follow up with ENT  Follow up 3 months

## 2023-05-21 ENCOUNTER — Telehealth (INDEPENDENT_AMBULATORY_CARE_PROVIDER_SITE_OTHER): Payer: Self-pay | Admitting: Gastroenterology

## 2023-05-21 NOTE — Telephone Encounter (Signed)
Spoke with [name] and inquired about denial of CT. Informed [name] that we have tried at least 3 times and all times have been denied or stated that a denial is on file less than 60 days. [name] is going to speak to her higher up to see about expediting this request. [name] will give me a call when she has more information.   (BarryHill [phone])

## 2023-05-21 NOTE — Progress Notes (Signed)
05/21/23: please see telephone message from 03/27/23

## 2023-05-21 NOTE — Telephone Encounter (Signed)
Yes, it would be the progressive decline in weight as you described in the notes. Thank you!

## 2023-05-21 NOTE — Telephone Encounter (Signed)
Mrs. Loleta Chance from [employer] calling requesting more information to give to higher ups at insurance. I have looked in chart to give Mrs. Hill pt recent weights. Looks like pt was 144 in Feb and 133 in May and has been steady at 133 for a while except for 129lbs a few months ago. Is there any thing else that you are concerned about since he has maintained 133lbs for a while? Please advise. Thank you. I will call Mrs. Hill back once I hear from you. [phone])

## 2023-05-21 NOTE — Telephone Encounter (Signed)
Thanks so much [place]

## 2023-05-24 NOTE — Telephone Encounter (Signed)
Left message for Mrs. Hill to return call

## 2023-05-27 ENCOUNTER — Other Ambulatory Visit (INDEPENDENT_AMBULATORY_CARE_PROVIDER_SITE_OTHER): Payer: Self-pay | Admitting: Gastroenterology

## 2023-05-28 ENCOUNTER — Other Ambulatory Visit: Payer: Self-pay | Admitting: Otolaryngology

## 2023-05-29 LAB — SPECIMEN STATUS REPORT

## 2023-05-30 LAB — SURGICAL PATHOLOGY

## 2023-05-31 LAB — H. PYLORI BREATH TEST: H pylori Breath Test: NEGATIVE

## 2023-06-14 ENCOUNTER — Encounter: Payer: Self-pay | Admitting: Urology

## 2023-06-14 ENCOUNTER — Ambulatory Visit: Payer: Medicare HMO | Admitting: Urology

## 2023-06-14 VITALS — BP 160/85 | HR 75

## 2023-06-14 DIAGNOSIS — R351 Nocturia: Secondary | ICD-10-CM

## 2023-06-14 DIAGNOSIS — N401 Enlarged prostate with lower urinary tract symptoms: Secondary | ICD-10-CM | POA: Diagnosis not present

## 2023-06-14 DIAGNOSIS — N138 Other obstructive and reflux uropathy: Secondary | ICD-10-CM

## 2023-06-14 LAB — BLADDER SCAN AMB NON-IMAGING: Scan Result: 113

## 2023-06-14 MED ORDER — ALFUZOSIN HCL ER 10 MG PO TB24: 10.0000 mg | ORAL_TABLET | Freq: Every day | ORAL | 11 refills | Status: DC

## 2023-06-14 NOTE — Patient Instructions (Signed)

## 2023-06-14 NOTE — Progress Notes (Unsigned)
post void residual=113

## 2023-06-14 NOTE — Progress Notes (Unsigned)
06/14/2023 11:25 AM   [name] [birth year] [id number]  Referring provider: Benetta Spar, MD [address] [place],  [place] [address]  Urinary frequency   HPI: [name] is a 82yo here for evaluation of urinary frequency and nocturia. For the past 5-6 months he has noted a weaker urinary stream, nocturia 4x, and urinary frequency. Urine stream is fair. IPSS 23 QOL 5. He has starting/stopping of his urinary stream. No prior alpha blocker therapy.    PMH: Past Medical History:  Diagnosis Date   Arthritis    fingers, back   Chronic back pain    Diabetes mellitus Since 1995   Takes Glucovance and Onglyza daily   Diabetes mellitus without complication (HCC)    GERD (gastroesophageal reflux disease)    takes Dexilant and Omeprazole daily   Herpes    History of colon polyps    History of gastric ulcer 25+yrs ago   Hyperlipidemia Since 1995   takes Simvastatin daily   Hypertension Since 1995   takes Metoprolol and Ramipril daily   Hypertension    Pneumonia    hx of;as a child   Sleep apnea    doesn't use CPAP   Urinary frequency    Urinary urgency    takes Flomax daily    Surgical History: Past Surgical History:  Procedure Laterality Date   BACK SURGERY  1962   Lumbar   BACK SURGERY     bilateral cataract surgery     BIOPSY  02/23/2022   Procedure: BIOPSY;  Surgeon: Dolores Frame, MD;  [facility]: [facility];  Service: Gastroenterology;;   BIOPSY  01/01/2023   Procedure: BIOPSY;  Surgeon: Dolores Frame, MD;  [facility]: [facility];  Service: Gastroenterology;;   CARDIAC CATHETERIZATION  02/16/10   CERVICAL SPINE SURGERY     COLONOSCOPY     COLONOSCOPY N/A 04/23/2013   Procedure: COLONOSCOPY;  Surgeon: Malissa Hippo, MD;  [facility]: [facility];  Service: Endoscopy;  Laterality: N/A;  1030   COLONOSCOPY N/A 07/24/2018   Procedure: COLONOSCOPY;  Surgeon: Malissa Hippo, MD;  [facility];   Service: Endoscopy;  Laterality: N/A;  8:30   COLONOSCOPY WITH PROPOFOL N/A 11/28/2021   Procedure: COLONOSCOPY WITH PROPOFOL;  Surgeon: Dolores Frame, MD;  [facility];  Service: Gastroenterology;  Laterality: N/A;  205   ESOPHAGEAL DILATION N/A 02/10/2020   Procedure: ESOPHAGEAL DILATION;  Surgeon: Malissa Hippo, MD;  [facility];  Service: Endoscopy;  Laterality: N/A;   ESOPHAGOGASTRODUODENOSCOPY N/A 12/24/2014   Procedure: ESOPHAGOGASTRODUODENOSCOPY (EGD);  Surgeon: Malissa Hippo, MD;  [facility];  Service: Endoscopy;  Laterality: N/A;  2:45   ESOPHAGOGASTRODUODENOSCOPY (EGD) WITH ESOPHAGEAL DILATION     ESOPHAGOGASTRODUODENOSCOPY (EGD) WITH PROPOFOL N/A 02/10/2020   Procedure: ESOPHAGOGASTRODUODENOSCOPY (EGD) WITH PROPOFOL;  Surgeon: Malissa Hippo, MD;  [facility];  Service: Endoscopy;  Laterality: N/A;  250   ESOPHAGOGASTRODUODENOSCOPY (EGD) WITH PROPOFOL N/A 02/23/2022   Procedure: ESOPHAGOGASTRODUODENOSCOPY (EGD) WITH PROPOFOL;  Surgeon: Dolores Frame, MD;  [facility];  Service: Gastroenterology;  Laterality: N/A;  1030   ESOPHAGOGASTRODUODENOSCOPY (EGD) WITH PROPOFOL N/A 01/01/2023   Procedure: ESOPHAGOGASTRODUODENOSCOPY (EGD) WITH PROPOFOL;  Surgeon: Dolores Frame, MD;  [facility];  Service: Gastroenterology;  Laterality: N/A;  9:00AM;asa 3   HAND SURGERY     Right   LUMBAR LAMINECTOMY/DECOMPRESSION MICRODISCECTOMY N/A 08/29/2012   Procedure: LUMBAR LAMINECTOMY/DECOMPRESSION MICRODISCECTOMY 1  LEVEL;  Surgeon: Karn Cassis, MD;  [facility]: [facility];  Service: Neurosurgery;  Laterality: N/A;  Lumbar four-five laminectomies   NECK SURGERY     PENILE PROSTHESIS IMPLANT     PENILE PROSTHESIS IMPLANT N/A 08/07/2021   Procedure: PENILE PROTHESIS INFLATABLE;  Surgeon: Malen Gauze, MD;  Location: [facility];  Service: Urology;  Laterality: N/A;   POLYPECTOMY  11/28/2021    Procedure: POLYPECTOMY;  Surgeon: Dolores Frame, MD;  [facility];  Service: Gastroenterology;;   REMOVAL OF PENILE PROSTHESIS N/A 08/07/2021   Procedure: REMOVAL OF PENILE PROSTHESIS;  Surgeon: Malen Gauze, MD;  Location: [facility];  Service: Urology;  Laterality: N/A;   ROTATOR CUFF REPAIR     Right   SHOULDER SURGERY     TRANSURETHRAL RESECTION OF PROSTATE N/A 05/25/2019   Procedure: TRANSURETHRAL RESECTION OF THE PROSTATE (TURP);  Surgeon: Malen Gauze, MD;  Location: [facility];  Service: Urology;  Laterality: N/A;   WRIST SURGERY     Left   WRIST SURGERY      Home Medications:  Allergies as of 06/14/2023       Reactions   Aspirin Other (See Comments)   Higher dosage upsets stomach   Bayer Aspirin [aspirin] Other (See Comments)   Stomach "flares up"        Medication List        Accurate as of June 14, 2023 11:25 AM. If you have any questions, ask your nurse or doctor.          acyclovir 200 MG capsule Commonly known as: ZOVIRAX Take 200 mg by mouth as needed.   amLODipine 10 MG tablet Commonly known as: NORVASC Take 10 mg by mouth daily.   aspirin EC 81 MG tablet Take 81 mg by mouth daily. Swallow whole.   Bismuth 262 MG Chew Chew 2 each by mouth every 6 (six) hours.   buprenorphine 15 MCG/HR Commonly known as: BUTRANS Place onto the skin once a week.   cetirizine 10 MG tablet Commonly known as: ZYRTEC Take 10 mg by mouth daily.   diclofenac Sodium 1 % Gel Commonly known as: Voltaren Apply 4 g topically 4 (four) times daily.   dicyclomine 10 MG capsule Commonly known as: BENTYL Take 1 capsule (10 mg total) by mouth every 12 (twelve) hours as needed (abdominal pain).   gabapentin 300 MG capsule Commonly known as: NEURONTIN Take 300 mg by mouth 2 (two) times daily.   glyBURIDE-metformin 5-500 MG tablet Commonly known as: GLUCOVANCE Take 2 tablets by mouth 2 (two) times daily with a meal.    hydrochlorothiazide 12.5 MG tablet Commonly known as: HYDRODIURIL Take 12.5 mg by mouth daily.   Jardiance 25 MG Tabs tablet Generic drug: empagliflozin Take 25 mg by mouth daily.   labetalol 200 MG tablet Commonly known as: NORMODYNE Take 200 mg by mouth daily at 6 (six) AM.   magic mouthwash (nystatin, lidocaine, diphenhydrAMINE, alum & mag hydroxide) suspension Swish and spit 5 mLs 3 (three) times daily as needed for mouth pain.   metoprolol succinate 50 MG 24 hr tablet Commonly known as: TOPROL-XL Take 50 mg by mouth daily. Take with or immediately following a meal.   nystatin 100000 UNIT/ML suspension Commonly known as: MYCOSTATIN Use as directed 5 mLs (500,000 Units total) in the mouth or throat 4 (four) times daily. Swish and swallow   omeprazole 40 MG capsule Commonly known as: PRILOSEC TAKE 1 CAPSULE(40 MG) BY MOUTH DAILY  OneTouch Delica Plus Lancet33G Misc USE TO TEST TWICE DAILY   OneTouch Verio Flex System w/Device Kit USE TO TEST TWICE DAILY   OneTouch Verio test strip Generic drug: glucose blood USE TO TEST TWICE DAILY   polyethylene glycol powder 17 GM/SCOOP powder Commonly known as: GlycoLax Take 17 g by mouth daily. What changed:  when to take this reasons to take this   ramipril 10 MG capsule Commonly known as: ALTACE Take 10 mg by mouth daily.   simvastatin 20 MG tablet Commonly known as: ZOCOR Take 20 mg by mouth daily.   tamsulosin 0.4 MG Caps capsule Commonly known as: FLOMAX Take 0.4 mg by mouth daily.        Allergies:  Allergies  Allergen Reactions   Aspirin Other (See Comments)    Higher dosage upsets stomach   Bayer Aspirin [Aspirin] Other (See Comments)    Stomach "flares up"    Family History: Family History  Problem Relation Age of Onset   Heart attack Father 61       MI   Hypertension Father    Heart attack Brother 62       MI   Diabetes Mother    Heart attack Paternal Uncle     Social History:   reports that he has never smoked. He has never used smokeless tobacco. He reports that he does not currently use alcohol. He reports current drug use. Frequency: 3.00 times per week. Drug: Marijuana.  ROS: All other review of systems were reviewed and are negative except what is noted above in HPI  Physical Exam: BP (!) 160/85   Pulse 75   Constitutional:  Alert and oriented, No acute distress. HEENT: [place] AT, moist mucus membranes.  Trachea midline, no masses. Cardiovascular: No clubbing, cyanosis, or edema. Respiratory: Normal respiratory effort, no increased work of breathing. GI: Abdomen is soft, nontender, nondistended, no abdominal masses GU: No CVA tenderness.  Lymph: No cervical or inguinal lymphadenopathy. Skin: No rashes, bruises or suspicious lesions. Neurologic: Grossly intact, no focal deficits, moving all 4 extremities. Psychiatric: Normal mood and affect.  Laboratory Data: Lab Results  Component Value Date   WBC 3.2 (L) 12/25/2022   HGB 10.9 (L) 12/25/2022   HCT 33.7 (L) 12/25/2022   MCV 93.4 12/25/2022   PLT 209 12/25/2022    Lab Results  Component Value Date   CREATININE 0.90 12/25/2022    No results found for: "PSA"  No results found for: "TESTOSTERONE"  Lab Results  Component Value Date   HGBA1C 8.5 (H) 08/04/2021    Urinalysis    Component Value Date/Time   COLORURINE YELLOW 12/25/2022 0111   APPEARANCEUR CLEAR 12/25/2022 0111   APPEARANCEUR Clear 09/25/2021 1121   LABSPEC 1.011 12/25/2022 0111   PHURINE 5.0 12/25/2022 0111   GLUCOSEU 150 (A) 12/25/2022 0111   HGBUR SMALL (A) 12/25/2022 0111   BILIRUBINUR NEGATIVE 12/25/2022 0111   BILIRUBINUR Negative 09/25/2021 1121   KETONESUR NEGATIVE 12/25/2022 0111   PROTEINUR NEGATIVE 12/25/2022 0111   UROBILINOGEN 0.2 11/04/2019 1451   UROBILINOGEN 0.2 05/15/2014 0122   NITRITE NEGATIVE 12/25/2022 0111   LEUKOCYTESUR NEGATIVE 12/25/2022 0111    Lab Results  Component Value Date   LABMICR See  below: 09/25/2021   WBCUA 0-5 09/25/2021   LABEPIT 0-10 09/25/2021   MUCUS Present 09/25/2021   BACTERIA NONE SEEN 12/25/2022    Pertinent Imaging:  No results found for this or any previous visit.  No results found for this or any previous  visit.  No results found for this or any previous visit.  No results found for this or any previous visit.  No results found for this or any previous visit.  No valid procedures specified. No results found for this or any previous visit.  Results for orders placed during the hospital encounter of 09/20/17  CT Renal Stone Study  Narrative CLINICAL DATA:  No new urinary tract stone. Low back pain for 2 weeks. Urinary retention and dysuria.  EXAM: CT ABDOMEN AND PELVIS WITHOUT CONTRAST  TECHNIQUE: Multidetector CT imaging of the abdomen and pelvis was performed following the standard protocol without IV contrast.  COMPARISON:  CT of the abdomen and pelvis 01/25/2012. Lumbar spine radiographs 04/24/2017.  FINDINGS: Lower chest: Excellent recall bronchiectasis is again noted at the lung bases bilaterally without superimposed airspace disease. The heart size is normal. No significant pleural or pericardial effusion is present.  Hepatobiliary: No focal liver abnormality is seen. No gallstones, gallbladder wall thickening, or biliary dilatation.  Pancreas: Unremarkable. No pancreatic ductal dilatation or surrounding inflammatory changes.  Spleen: Calcification along the lateral margin of the spleen is stable. No focal lesions are evident.  Adrenals/Urinary Tract: Adrenal glands are normal bilaterally. No stone or mass lesion is present. There is no obstruction. The ureters are within normal limits bilaterally. The urinary bladder is within normal limits.  Stomach/Bowel: The stomach and duodenum are within normal limits. Small bowel is unremarkable. The terminal ileum is within normal limits. The appendix is visualized and  normal.  Vascular/Lymphatic: Atherosclerotic calcifications are present without aneurysm.  Reproductive: A penile prosthesis is noted. The prostate gland is somewhat prominent and indents the inferior margin of the urinary bladder. It measures 4 cm in transverse diameter.  Other: No abdominal wall hernia or abnormality. No abdominopelvic ascites.  Musculoskeletal: Progressive endplate degenerative changes and sclerosis are present at L3-4, L4-5, and L5-S1. A transitional S1 segment is again noted. Foraminal narrowing is evident bilaterally at L4-5 and L5-S1.  IMPRESSION: 1. No urinary tract obstruction or stone. 2. Prominent prostate gland indents the inferior border of the urinary bladder. 3. progressive degenerative changes in the lower lumbar spine. 4. Penile prosthesis.   Electronically Signed By: Marin Roberts [name] On: 09/20/2017 16:28   Assessment & Plan:    1. Frequent urination at night We will trial uroxatral 10mg  qhs - Urinalysis, Routine w reflex microscopic - BLADDER SCAN AMB NON-IMAGING  2. Benign prostatic hyperplasia with urinary obstruction Uroxatral 10mg  qhs   No follow-ups on file.  Wilkie Aye, MD  [facility] [place]

## 2023-06-20 ENCOUNTER — Other Ambulatory Visit (INDEPENDENT_AMBULATORY_CARE_PROVIDER_SITE_OTHER): Payer: Self-pay | Admitting: *Deleted

## 2023-06-20 MED ORDER — OMEPRAZOLE 40 MG PO CPDR: 40.0000 mg | DELAYED_RELEASE_CAPSULE | Freq: Every day | ORAL | 1 refills | Status: DC

## 2023-07-01 ENCOUNTER — Ambulatory Visit (HOSPITAL_COMMUNITY)
Admission: RE | Admit: 2023-07-01 | Discharge: 2023-07-01 | Disposition: A | Discharge status: Home / Self Care | Payer: Medicare HMO | Source: Ambulatory Visit | Attending: Internal Medicine | Admitting: Internal Medicine

## 2023-07-01 ENCOUNTER — Other Ambulatory Visit (HOSPITAL_COMMUNITY): Payer: Self-pay | Admitting: Internal Medicine

## 2023-07-01 DIAGNOSIS — R059 Cough, unspecified: Secondary | ICD-10-CM

## 2023-07-29 ENCOUNTER — Other Ambulatory Visit (HOSPITAL_COMMUNITY): Payer: Self-pay | Admitting: Gerontology

## 2023-07-29 ENCOUNTER — Ambulatory Visit (HOSPITAL_COMMUNITY)
Admission: RE | Admit: 2023-07-29 | Discharge: 2023-07-29 | Disposition: A | Discharge status: Home / Self Care | Payer: Medicare HMO | Source: Ambulatory Visit | Attending: Gerontology | Admitting: Gerontology

## 2023-07-29 DIAGNOSIS — R0989 Other specified symptoms and signs involving the circulatory and respiratory systems: Secondary | ICD-10-CM

## 2023-07-29 DIAGNOSIS — R059 Cough, unspecified: Secondary | ICD-10-CM

## 2023-08-15 ENCOUNTER — Ambulatory Visit (INDEPENDENT_AMBULATORY_CARE_PROVIDER_SITE_OTHER): Payer: Medicare HMO | Admitting: Gastroenterology

## 2023-09-02 ENCOUNTER — Ambulatory Visit (INDEPENDENT_AMBULATORY_CARE_PROVIDER_SITE_OTHER): Payer: Medicare HMO | Admitting: Gastroenterology

## 2023-09-20 ENCOUNTER — Ambulatory Visit: Payer: Medicare HMO | Admitting: Urology

## 2023-10-21 ENCOUNTER — Encounter (INDEPENDENT_AMBULATORY_CARE_PROVIDER_SITE_OTHER): Payer: Self-pay | Admitting: Gastroenterology

## 2023-10-21 ENCOUNTER — Ambulatory Visit (INDEPENDENT_AMBULATORY_CARE_PROVIDER_SITE_OTHER): Payer: Medicare HMO | Admitting: Gastroenterology

## 2023-10-21 VITALS — BP 148/79 | HR 62 | Temp 97.8°F | Ht 70.0 in | Wt 144.5 lb

## 2023-10-21 DIAGNOSIS — B9681 Helicobacter pylori [H. pylori] as the cause of diseases classified elsewhere: Secondary | ICD-10-CM

## 2023-10-21 DIAGNOSIS — Z8619 Personal history of other infectious and parasitic diseases: Secondary | ICD-10-CM

## 2023-10-21 DIAGNOSIS — R109 Unspecified abdominal pain: Secondary | ICD-10-CM

## 2023-10-21 DIAGNOSIS — K5900 Constipation, unspecified: Secondary | ICD-10-CM | POA: Diagnosis not present

## 2023-10-21 DIAGNOSIS — K5903 Drug induced constipation: Secondary | ICD-10-CM

## 2023-10-21 DIAGNOSIS — R1033 Periumbilical pain: Secondary | ICD-10-CM

## 2023-10-21 NOTE — Patient Instructions (Addendum)
 Perform H. pylori breath test, will need to stop omeprazole and famotidine 2 weeks prior to performing the test Start taking Miralax 1 capful every day for one week. If bowel movements do not improve, increase to 2 capfuls every day. If after two weeks there is no improvement, increase to 2 capfuls in AM and one at night.

## 2023-10-21 NOTE — Progress Notes (Signed)
 [name], [name] Gastroenterology & Hepatology [facility] [address] [place], [place] [address]  Primary Care Physician: Barry Heidelberg, MD [address] [place] [address]  I will communicate my assessment and recommendations to the referring MD via EMR.  Problems: History of H. pylori status post bismuth quadruple regimen Recurrent abdominal pain episodes.  History of Present Illness: [name] is a 82 y.o. male with past medical history of GERD, diabetes, Schatzki's ring, hypertension, opioid-induced constipation, esophageal dysmotility, hyperlipidemia and OSA who presents for follow up of abdominal pain.  The patient was last seen on 05/14/2023. At that time, the patient was ordered to have H. pylori breath test off PPI, and will then restart omeprazole in the morning and Pepcid at bedtime.  Also advised to take Magic mouthwash for mild complaints and follow-up with ENT.  H. pylori breath test on 05/27/2023 was negative.  Unclear if he actually stopped taking PPI at that time.  Patient is a poor historian. States that he did well after receiving the antibiotic course for H. pylori. He reports that he has presented recurrent episodes of sharp abdominal pain in his mid abdomen around his umbilical area which does not radiate, which he reports that have been intermittent.  It is not clear if these episodes were present upon completion of the H. pylori treatment.  However, he reports that recently they have been present more often.  He has managed the pain episodes with Peptobismol. Has only vomited one time.  He is having 1-2 bowel movements per day but stool is very small. Is not taking any laxatives.  Last time seen by ENT was on 06/11/2023.  He was being followed by them for nasal polypoid lesion which was benign.  The patient denies having any nausea, ever, chills, hematochezia, melena, hematemesis,  abdominal distention, diarrhea, jaundice, pruritus or weight loss - he has been gaining back some weight in fact.  Last EGD: 01/01/2023 - Normal esophagus. - Gastric mucosal atrophy. Biopsied. - Normal examined duodenum. Pathology showed H. Pylori gastritis.   Last Colonoscopy:11/28/2021 The perianal and digital rectal examinations were normal. Six sessile polyps were found in the ascending colon. The polyps were 2 to 6 mm in size. These polyps were removed with a cold snare. Resection and retrieval were complete for 3 polyps, but the other 3 could not be retrieved (fully resected). The ascending colon and cecum appeared normal othwerwise. CT scan findings likely related to stool. Non-bleeding internal hemorrhoids were found during retroflexion. The hemorrhoids were small. Pathology consistent with tubular adenomas.   Not Recommended to have a repeat colonoscopy given patient comorbidities.  Past Medical History: Past Medical History:  Diagnosis Date   Arthritis    fingers, back   Chronic back pain    Diabetes mellitus Since 1995   Takes Glucovance and Onglyza daily   Diabetes mellitus without complication (HCC)    GERD (gastroesophageal reflux disease)    takes Dexilant and Omeprazole daily   Herpes    History of colon polyps    History of gastric ulcer 25+yrs ago   Hyperlipidemia Since 1995   takes Simvastatin daily   Hypertension Since 1995   takes Metoprolol and Ramipril daily   Hypertension    Pneumonia    hx of;as a child   Sleep apnea    doesn't use CPAP   Urinary frequency    Urinary urgency    takes Flomax daily    Past Surgical  History: Past Surgical History:  Procedure Laterality Date   BACK SURGERY  1962   Lumbar   BACK SURGERY     bilateral cataract surgery     BIOPSY  02/23/2022   Procedure: BIOPSY;  Surgeon: Dolores Frame, MD;  [facility];  Service: Gastroenterology;;   BIOPSY  01/01/2023   Procedure: BIOPSY;  Surgeon:  Dolores Frame, MD;  [facility];  Service: Gastroenterology;;   CARDIAC CATHETERIZATION  02/16/10   CERVICAL SPINE SURGERY     COLONOSCOPY     COLONOSCOPY N/A 04/23/2013   Procedure: COLONOSCOPY;  Surgeon: Malissa Hippo, MD;  [facility];  Service: Endoscopy;  Laterality: N/A;  1030   COLONOSCOPY N/A 07/24/2018   Procedure: COLONOSCOPY;  Surgeon: Malissa Hippo, MD;  [facility];  Service: Endoscopy;  Laterality: N/A;  8:30   COLONOSCOPY WITH PROPOFOL N/A 11/28/2021   Procedure: COLONOSCOPY WITH PROPOFOL;  Surgeon: Dolores Frame, MD;  [facility];  Service: Gastroenterology;  Laterality: N/A;  205   ESOPHAGEAL DILATION N/A 02/10/2020   Procedure: ESOPHAGEAL DILATION;  Surgeon: Malissa Hippo, MD;  [facility];  Service: Endoscopy;  Laterality: N/A;   ESOPHAGOGASTRODUODENOSCOPY N/A 12/24/2014   Procedure: ESOPHAGOGASTRODUODENOSCOPY (EGD);  Surgeon: Malissa Hippo, MD;  [facility];  Service: Endoscopy;  Laterality: N/A;  2:45   ESOPHAGOGASTRODUODENOSCOPY (EGD) WITH ESOPHAGEAL DILATION     ESOPHAGOGASTRODUODENOSCOPY (EGD) WITH PROPOFOL N/A 02/10/2020   Procedure: ESOPHAGOGASTRODUODENOSCOPY (EGD) WITH PROPOFOL;  Surgeon: Malissa Hippo, MD;  [facility];  Service: Endoscopy;  Laterality: N/A;  250   ESOPHAGOGASTRODUODENOSCOPY (EGD) WITH PROPOFOL N/A 02/23/2022   Procedure: ESOPHAGOGASTRODUODENOSCOPY (EGD) WITH PROPOFOL;  Surgeon: Dolores Frame, MD;  [facility];  Service: Gastroenterology;  Laterality: N/A;  1030   ESOPHAGOGASTRODUODENOSCOPY (EGD) WITH PROPOFOL N/A 01/01/2023   Procedure: ESOPHAGOGASTRODUODENOSCOPY (EGD) WITH PROPOFOL;  Surgeon: Dolores Frame, MD;  [facility];  Service: Gastroenterology;  Laterality: N/A;  9:00AM;asa 3   HAND SURGERY     Right   LUMBAR LAMINECTOMY/DECOMPRESSION MICRODISCECTOMY N/A 08/29/2012   Procedure:  LUMBAR LAMINECTOMY/DECOMPRESSION MICRODISCECTOMY 1 LEVEL;  Surgeon: Karn Cassis, MD;  [facility];  Service: Neurosurgery;  Laterality: N/A;  Lumbar four-five laminectomies   NECK SURGERY     PENILE PROSTHESIS IMPLANT     PENILE PROSTHESIS IMPLANT N/A 08/07/2021   Procedure: PENILE PROTHESIS INFLATABLE;  Surgeon: Malen Gauze, MD;  Location: [facility];  Service: Urology;  Laterality: N/A;   POLYPECTOMY  11/28/2021   Procedure: POLYPECTOMY;  Surgeon: Dolores Frame, MD;  [facility];  Service: Gastroenterology;;   REMOVAL OF PENILE PROSTHESIS N/A 08/07/2021   Procedure: REMOVAL OF PENILE PROSTHESIS;  Surgeon: Malen Gauze, MD;  Location: [facility];  Service: Urology;  Laterality: N/A;   ROTATOR CUFF REPAIR     Right   SHOULDER SURGERY     TRANSURETHRAL RESECTION OF PROSTATE N/A 05/25/2019   Procedure: TRANSURETHRAL RESECTION OF THE PROSTATE (TURP);  Surgeon: Malen Gauze, MD;  Location: [facility];  Service: Urology;  Laterality: N/A;   WRIST SURGERY     Left   WRIST SURGERY      Family History: Family History  Problem Relation Age of Onset   Heart attack Father 27       MI   Hypertension Father    Heart attack Brother 85  MI   Diabetes Mother    Heart attack Paternal Uncle     Social History: Social History   Tobacco Use  Smoking Status Never  Smokeless Tobacco Never   Social History   Substance and Sexual Activity  Alcohol Use Not Currently   Comment: occasionally    Social History   Substance and Sexual Activity  Drug Use Yes   Frequency: 3.0 times per week   Types: Marijuana   Comment: last on 11/26/21    Allergies: Allergies  Allergen Reactions   Aspirin Other (See Comments)    Higher dosage upsets stomach   Bayer Aspirin [Aspirin] Other (See Comments)    Stomach "flares up"    Medications: Current Outpatient Medications  Medication Sig Dispense Refill   acyclovir (ZOVIRAX) 200 MG capsule Take 200  mg by mouth as needed.     alfuzosin (UROXATRAL) 10 MG 24 hr tablet Take 1 tablet (10 mg total) by mouth at bedtime. 30 tablet 11   amLODipine (NORVASC) 10 MG tablet Take 10 mg by mouth daily.     aspirin EC 81 MG tablet Take 81 mg by mouth daily. Swallow whole.     Blood Glucose Monitoring Suppl (ONETOUCH VERIO FLEX SYSTEM) w/Device KIT USE TO TEST TWICE DAILY     buprenorphine (BUTRANS) 15 MCG/HR Place onto the skin once a week.     cetirizine (ZYRTEC) 10 MG tablet Take 10 mg by mouth daily.     gabapentin (NEURONTIN) 300 MG capsule Take 300 mg by mouth 2 (two) times daily.     glyBURIDE-metformin (GLUCOVANCE) 5-500 MG per tablet Take 2 tablets by mouth 2 (two) times daily with a meal.     hydrochlorothiazide (HYDRODIURIL) 12.5 MG tablet Take 12.5 mg by mouth daily.     JARDIANCE 25 MG TABS tablet Take 25 mg by mouth daily.     labetalol (NORMODYNE) 200 MG tablet Take 200 mg by mouth daily at 6 (six) AM.     Lancets (ONETOUCH DELICA PLUS LANCET33G) MISC USE TO TEST TWICE DAILY     metoprolol succinate (TOPROL-XL) 50 MG 24 hr tablet Take 50 mg by mouth daily. Take with or immediately following a meal.     omeprazole (PRILOSEC) 40 MG capsule Take 1 capsule (40 mg total) by mouth daily. 90 capsule 1   ONETOUCH VERIO test strip USE TO TEST TWICE DAILY     ramipril (ALTACE) 10 MG capsule Take 10 mg by mouth daily.      simvastatin (ZOCOR) 20 MG tablet Take 20 mg by mouth daily.     diclofenac Sodium (VOLTAREN) 1 % GEL Apply 4 g topically 4 (four) times daily. (Patient not taking: Reported on 10/21/2023) 150 g 0   dicyclomine (BENTYL) 10 MG capsule Take 1 capsule (10 mg total) by mouth every 12 (twelve) hours as needed (abdominal pain). (Patient not taking: Reported on 10/21/2023) 60 capsule 2   magic mouthwash (nystatin, lidocaine, diphenhydrAMINE, alum & mag hydroxide) suspension Swish and spit 5 mLs 3 (three) times daily as needed for mouth pain. (Patient not taking: Reported on 10/21/2023) 210 mL 0    nystatin (MYCOSTATIN) 100000 UNIT/ML suspension Use as directed 5 mLs (500,000 Units total) in the mouth or throat 4 (four) times daily. Swish and swallow (Patient not taking: Reported on 10/21/2023) 240 mL 0   polyethylene glycol powder (GLYCOLAX) 17 GM/SCOOP powder Take 17 g by mouth daily. (Patient not taking: Reported on 10/21/2023) 850 g 3   No current facility-administered  medications for this visit.    Review of Systems: GENERAL: negative for malaise, night sweats HEENT: No changes in hearing or vision, no nose bleeds or other nasal problems. NECK: Negative for lumps, goiter, pain and significant neck swelling RESPIRATORY: Negative for cough, wheezing CARDIOVASCULAR: Negative for chest pain, leg swelling, palpitations, orthopnea GI: SEE HPI MUSCULOSKELETAL: Negative for joint pain or swelling, back pain, and muscle pain. SKIN: Negative for lesions, rash PSYCH: Negative for sleep disturbance, mood disorder and recent psychosocial stressors. HEMATOLOGY Negative for prolonged bleeding, bruising easily, and swollen nodes. ENDOCRINE: Negative for cold or heat intolerance, polyuria, polydipsia and goiter. NEURO: negative for tremor, gait imbalance, syncope and seizures. The remainder of the review of systems is noncontributory.   Physical Exam: BP (!) 148/79   Pulse 62   Temp 97.8 F (36.6 C)   Ht 5\' 10"  (1.778 m)   Wt 144 lb 8 oz (65.5 kg)   BMI 20.73 kg/m  GENERAL: The patient is AO x3, in no acute distress. HEENT: Head is normocephalic and atraumatic. EOMI are intact. Mouth is well hydrated and without lesions. NECK: Supple. No masses LUNGS: Clear to auscultation. No presence of rhonchi/wheezing/rales. Adequate chest expansion HEART: RRR, normal s1 and s2. ABDOMEN: Soft, nontender, no guarding, no peritoneal signs, and nondistended. BS +. No masses. EXTREMITIES: Without any cyanosis, clubbing, rash, lesions or edema. NEUROLOGIC: AOx3, no focal motor deficit. SKIN: no  jaundice, no rashes  Imaging/Labs: as above  I personally reviewed and interpreted the available labs, imaging and endoscopic files.  Impression and Plan: [name] is a 83 y.o. male with past medical history of GERD, diabetes, Schatzki's ring, hypertension, opioid-induced constipation, esophageal dysmotility, hyperlipidemia and OSA who presents for follow up of abdominal pain.  Patient has presented previous issues with abdominal pain episodes.  He underwent significant workup in the past, was found to have H. pylori which was treated.  It appears that he had some improvement with bismuth quadruple regimen treatment, but unfortunately his symptoms have recurred.  At this point, we will need to proceed with repeat H. pylori breath test while off PPI to evaluate infection recurrence.  Part of his symptoms may be related to constipation, for which I advised him to start taking MiraLAX on a regular basis.  -Perform H. pylori breath test, will need to stop omeprazole and famotidine 2 weeks prior to performing the test -Start taking Miralax 1 capful every day for one week. If bowel movements do not improve, increase to 2 capfuls every day. If after two weeks there is no improvement, increase to 2 capfuls in AM and one at night.  All questions were answered.      Barry Cress, MD Gastroenterology and Hepatology [facility]

## 2023-10-30 ENCOUNTER — Telehealth: Payer: Self-pay | Admitting: Gastroenterology

## 2023-10-30 NOTE — Telephone Encounter (Signed)
 I spoke with the patient today I asked if he stopped his Omeprazole  and famotidine  as he was supposed to be off of these two medications for two weeks, until he can perform the H pylori testing. He says the paper he was given only showed that he was supposed to stop the omeprazole  and no one told him he was supposed to stop any other medications. I looked at his medication list and famotidine  was not listed. I asked him if he had a medication spelled Famotidine . He says he does not know and wanted to bring his bag of medication up to the office for us  to look through. I asked if there was a family member we could speak with to see if they could go through the bag for him. He says no. He asked if he could bring his bag of medications here today and let me look in them to see if it was in there. I advised I could go over his medications another day, possibly Friday . Patient states understanding and says he will bring his medications here on Friday 11/01/2023,for us  to go through. Patient states understanding that we do not know if he has infection yet as we are awaiting for the H pylori test to be done, which he says he will be off omeprazole  for two weeks on 11/05/2023, at which time he will have the testing. I advised that the reflux was to be expected since he is not taking his ppi.

## 2023-10-30 NOTE — Telephone Encounter (Signed)
 Pt seen Dr Sammi Crick on 10/21/2023 and came in today asking if he had an infection in his stomach because he has a bitter taste that he can't get rid of and his stomach was hurting him. He is scheduled for a follow up in July. Please advise if he needs to come back in or what recommendations can be made. [phone]

## 2023-10-30 NOTE — Telephone Encounter (Signed)
 Thanks

## 2023-10-31 ENCOUNTER — Emergency Department (HOSPITAL_COMMUNITY)
Admission: EM | Admit: 2023-10-31 | Discharge: 2023-11-01 | Disposition: A | Discharge status: Home / Self Care | Attending: Emergency Medicine | Admitting: Emergency Medicine

## 2023-10-31 ENCOUNTER — Encounter (HOSPITAL_COMMUNITY): Payer: Self-pay | Admitting: Emergency Medicine

## 2023-10-31 ENCOUNTER — Other Ambulatory Visit: Payer: Self-pay

## 2023-10-31 DIAGNOSIS — Z7982 Long term (current) use of aspirin: Secondary | ICD-10-CM | POA: Insufficient documentation

## 2023-10-31 DIAGNOSIS — Z79899 Other long term (current) drug therapy: Secondary | ICD-10-CM | POA: Insufficient documentation

## 2023-10-31 DIAGNOSIS — I10 Essential (primary) hypertension: Secondary | ICD-10-CM | POA: Diagnosis not present

## 2023-10-31 DIAGNOSIS — E871 Hypo-osmolality and hyponatremia: Secondary | ICD-10-CM | POA: Insufficient documentation

## 2023-10-31 DIAGNOSIS — Z7984 Long term (current) use of oral hypoglycemic drugs: Secondary | ICD-10-CM | POA: Insufficient documentation

## 2023-10-31 DIAGNOSIS — D649 Anemia, unspecified: Secondary | ICD-10-CM | POA: Insufficient documentation

## 2023-10-31 DIAGNOSIS — R109 Unspecified abdominal pain: Secondary | ICD-10-CM | POA: Insufficient documentation

## 2023-10-31 DIAGNOSIS — E119 Type 2 diabetes mellitus without complications: Secondary | ICD-10-CM | POA: Insufficient documentation

## 2023-10-31 LAB — URINALYSIS, ROUTINE W REFLEX MICROSCOPIC
Bacteria, UA: NONE SEEN
Bilirubin Urine: NEGATIVE
Glucose, UA: >=500 mg/dL — AB
Ketones, ur: NEGATIVE mg/dL
Leukocytes,Ua: NEGATIVE
Nitrite: NEGATIVE
Protein, ur: NEGATIVE mg/dL
Specific Gravity, Urine: 1.023 (ref 1.005–1.030)
pH: 6 (ref 5.0–8.0)

## 2023-10-31 LAB — CBC
HCT: 35.7 % — ABNORMAL LOW (ref 39.0–52.0)
Hemoglobin: 11.9 g/dL — ABNORMAL LOW (ref 13.0–17.0)
MCH: 30.7 pg (ref 26.0–34.0)
MCHC: 33.3 g/dL (ref 30.0–36.0)
MCV: 92 fL (ref 80.0–100.0)
Platelets: 223 10*3/uL (ref 150–400)
RBC: 3.88 MIL/uL — ABNORMAL LOW (ref 4.22–5.81)
RDW: 13.6 % (ref 11.5–15.5)
WBC: 3.8 10*3/uL — ABNORMAL LOW (ref 4.0–10.5)
nRBC: 0 % (ref 0.0–0.2)

## 2023-10-31 LAB — BASIC METABOLIC PANEL WITH GFR
Anion gap: 8 (ref 5–15)
BUN: 21 mg/dL (ref 8–23)
CO2: 28 mmol/L (ref 22–32)
Calcium: 9.3 mg/dL (ref 8.9–10.3)
Chloride: 98 mmol/L (ref 98–111)
Creatinine, Ser: 1.1 mg/dL (ref 0.61–1.24)
GFR, Estimated: >60 mL/min (ref >60)
Glucose, Bld: 214 mg/dL — ABNORMAL HIGH (ref 70–99)
Potassium: 4 mmol/L (ref 3.5–5.1)
Sodium: 134 mmol/L — ABNORMAL LOW (ref 135–145)

## 2023-10-31 NOTE — ED Triage Notes (Signed)
 Pt c/o right flank pain x1 day, reports foul smelling urine.

## 2023-11-01 ENCOUNTER — Emergency Department (HOSPITAL_COMMUNITY)

## 2023-11-01 NOTE — ED Provider Notes (Signed)
 [place] EMERGENCY DEPARTMENT AT [facility] Provider Note   CSN: [id number] Arrival date & time: 10/31/23  1952     History  Chief Complaint  Patient presents with   Abdominal Pain   Flank Pain    [name] is a 83 y.o. male.  The history is provided by the patient.  Abdominal Pain Flank Pain Associated symptoms include abdominal pain.  He has history of hypertension, diabetes, hyperlipidemia, GERD and noted onset this morning of right flank pain.  He also noted that his urine was foul-smelling.  He denies dysuria and denies any change in his chronic urinary urgency and frequency.  He denies nausea, vomiting, diarrhea.  Denies fever or chills.  He did take an over-the-counter pain medication which did give him temporary relief.  He does not recall what that medication was.   Home Medications Prior to Admission medications   Medication Sig Start Date End Date Taking? Authorizing Provider  acyclovir (ZOVIRAX) 200 MG capsule Take 200 mg by mouth as needed. 01/07/14   [provider]  alfuzosin  (UROXATRAL ) 10 MG 24 hr tablet Take 1 tablet (10 mg total) by mouth at bedtime. 06/14/23   McKenzie, Arden Beck, MD  amLODipine  (NORVASC ) 10 MG tablet Take 10 mg by mouth daily.    [provider]  aspirin  EC 81 MG tablet Take 81 mg by mouth daily. Swallow whole.    [provider]  Blood Glucose Monitoring Suppl (ONETOUCH VERIO FLEX SYSTEM) w/Device KIT USE TO TEST TWICE DAILY 09/30/19   [provider]  buprenorphine (BUTRANS) 15 MCG/HR Place onto the skin once a week.    [provider]  cetirizine (ZYRTEC) 10 MG tablet Take 10 mg by mouth daily. 01/12/20   [provider]  diclofenac  Sodium (VOLTAREN ) 1 % GEL Apply 4 g topically 4 (four) times daily. Patient not taking: Reported on 10/21/2023 04/11/22   Fae Homans R, PA-C  dicyclomine  (BENTYL ) 10 MG capsule Take 1 capsule (10 mg total) by mouth every 12 (twelve) hours as  needed (abdominal pain). Patient not taking: Reported on 10/21/2023 01/15/22   Urban Garden, MD  gabapentin  (NEURONTIN ) 300 MG capsule Take 300 mg by mouth 2 (two) times daily. 12/29/21   [provider]  glyBURIDE -metformin  (GLUCOVANCE ) 5-500 MG per tablet Take 2 tablets by mouth 2 (two) times daily with a meal.    [provider]  hydrochlorothiazide (HYDRODIURIL) 12.5 MG tablet Take 12.5 mg by mouth daily. 07/20/21   [provider]  JARDIANCE 25 MG TABS tablet Take 25 mg by mouth daily. 08/11/21   [provider]  labetalol  (NORMODYNE ) 200 MG tablet Take 200 mg by mouth daily at 6 (six) AM.    [provider]  Lancets (ONETOUCH DELICA PLUS LANCET33G) MISC USE TO TEST TWICE DAILY 09/30/19   [provider]  magic mouthwash (nystatin , lidocaine , diphenhydrAMINE , alum & mag hydroxide) suspension Swish and spit 5 mLs 3 (three) times daily as needed for mouth pain. Patient not taking: Reported on 10/21/2023 05/14/23   Carlan, Chelsea L, NP  metoprolol  succinate (TOPROL -XL) 50 MG 24 hr tablet Take 50 mg by mouth daily. Take with or immediately following a meal.    [provider]  nystatin  (MYCOSTATIN ) 100000 UNIT/ML suspension Use as directed 5 mLs (500,000 Units total) in the mouth or throat 4 (four) times daily. Swish and swallow Patient not taking: Reported on 10/21/2023 01/01/23   Urban Garden, MD  omeprazole  [facility]) 40  MG capsule Take 1 capsule (40 mg total) by mouth daily. 06/20/23   Patterson Bora, NP  ONETOUCH VERIO test strip USE TO TEST TWICE DAILY 09/30/19   [provider]  polyethylene glycol powder (GLYCOLAX ) 17 GM/SCOOP powder Take 17 g by mouth daily. Patient not taking: Reported on 10/21/2023 04/17/21   Urban Garden, MD  ramipril  (ALTACE ) 10 MG capsule Take 10 mg by mouth daily.  02/05/14   [provider]  simvastatin  (ZOCOR ) 20 MG tablet Take 20 mg by mouth daily.  04/15/13   [provider]  bismuth -metronidazole -tetracycline  (PLYERA) 140-125-125 MG per capsule Take 3 capsules by mouth 4 (four) times daily -  before meals and at bedtime.  09/26/11  [provider]      Allergies    Aspirin  and Bayer aspirin  [aspirin ]    Review of Systems   Review of Systems  Gastrointestinal:  Positive for abdominal pain.  Genitourinary:  Positive for flank pain.  All other systems reviewed and are negative.   Physical Exam Updated Vital Signs BP 130/81   Pulse 62   Temp 97.9 F (36.6 C) (Oral)   Resp 16   SpO2 99%  Physical Exam Vitals and nursing note reviewed.   83 year old male, resting comfortably and in no acute distress. Vital signs are normal. Oxygen  saturation is 99%, which is normal. Head is normocephalic and atraumatic. PERRLA, EOMI.  Neck is nontender and supple. Back is nontender in the midline.  There is mild right CVA tenderness. Lungs are clear without rales, wheezes, or rhonchi. Chest is nontender. Heart has regular rate and rhythm without murmur. Abdomen is soft, flat, nontender. Extremities have no cyanosis or edema. Skin is warm and dry without rash. Neurologic: Mental status is normal, moves all extremities equally.  ED Results / Procedures / Treatments   Labs (all labs ordered are listed, but only abnormal results are displayed) Labs Reviewed  URINALYSIS, ROUTINE W REFLEX MICROSCOPIC - Abnormal; Notable for the following components:      Result Value   Color, Urine STRAW (*)    Glucose, UA >=500 (*)    Hgb urine dipstick SMALL (*)    All other components within normal limits  BASIC METABOLIC PANEL WITH GFR - Abnormal; Notable for the following components:   Sodium 134 (*)    Glucose, Bld 214 (*)    All other components within normal limits  CBC - Abnormal; Notable for the following components:   WBC 3.8 (*)    RBC 3.88 (*)    Hemoglobin 11.9 (*)    HCT 35.7 (*)    All other components within normal  limits   Radiology CT Renal Stone Study Result Date: 11/01/2023 CLINICAL DATA:  Right flank pain. EXAM: CT ABDOMEN AND PELVIS WITHOUT CONTRAST TECHNIQUE: Multidetector CT imaging of the abdomen and pelvis was performed following the standard protocol without IV contrast. RADIATION DOSE REDUCTION: This exam was performed according to the departmental dose-optimization program which includes automated exposure control, adjustment of the mA and/or kV according to patient size and/or use of iterative reconstruction technique. COMPARISON:  Nov 21, 2021 FINDINGS: Lower chest: No acute abnormality. Hepatobiliary: No focal liver abnormality is seen. No gallstones, gallbladder wall thickening, or biliary dilatation. Pancreas: Unremarkable. No pancreatic ductal dilatation or surrounding inflammatory changes. Spleen: Chronic capsular calcification is seen involving an otherwise normal-appearing spleen. Adrenals/Urinary Tract: Adrenal glands are unremarkable. Kidneys are normal in size, without renal calculi, focal lesion, or hydronephrosis. Bladder is unremarkable.  Stomach/Bowel: Stomach is within normal limits. Appendix appears normal. A large stool burden is noted. No evidence of bowel wall thickening, distention, or inflammatory changes. Vascular/Lymphatic: Aortic atherosclerosis. No enlarged abdominal or pelvic lymph nodes. Reproductive: There is stable prostate gland enlargement. A penile pump is seen. A collapsed penile pump reservoir is seen within the anterior aspect of the pelvis on the left. An additional, mildly collapsed reservoir is seen within the anterior aspect of the pelvis on the right. Other: No abdominal wall hernia or abnormality. No abdominopelvic ascites. Musculoskeletal: Multilevel degenerative changes are seen throughout the lumbar spine with postoperative changes noted at the levels of L2 and L3. No acute osseous abnormality is identified. IMPRESSION: 1. Large stool burden without evidence of  bowel obstruction. 2. 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 3. No evidence of renal calculi. 4. Stable prostatomegaly. 5. Aortic atherosclerosis. Electronically Signed   By: Virgle Grime [name]   On: 11/01/2023 02:00    Procedures Procedures    Medications Ordered in ED Medications - No data to display  ED Course/ Medical Decision Making/ A&P                                 Medical Decision Making Amount and/or Complexity of Data Reviewed Labs: ordered. Radiology: ordered.   Right flank pain.  This is a presentation which entails a great number of treatment options and has significant risk for morbidity and complications.  Differential diagnosis includes, but is not limited to, pyelonephritis, urolithiasis, pancreatitis, diverticulitis, cholecystitis, appendicitis.  I have reviewed his laboratory test, my interpretation is mild hyponatremia which is not felt to be clinically significant, mild leukopenia which is chronic, mild anemia which is chronic, urinalysis showing no evidence of infection.  I have ordered renal stone protocol CT scan.  I have reviewed his past records, and note CT abdomen and pelvis on 11/21/2021 which showed no renal calculi.  CT scan shows no acute process.  Specifically, no evidence of renal calculi or ureteral calculi.  Incidental finding of large stool burden.  Have independently viewed the images, and agree with the radiologist's interpretation.  Patient tells me that he has not taken his MiraLAX  for the last 2 days, I have advised him to make sure he takes it every day and increase to twice a day if needed.  He states his pain has subsided greatly.  Have advised him he may take acetaminophen  as needed for pain, return for worsening symptoms.  Final Clinical Impression(s) / ED Diagnoses Final diagnoses:  Right flank pain  Hyponatremia  Normochromic normocytic anemia    Rx / DC Orders ED Discharge Orders     None         Alissa April,  MD 11/01/23 Madge Schiller

## 2023-11-01 NOTE — ED Notes (Signed)
 ED Provider at bedside.

## 2023-11-01 NOTE — Discharge Instructions (Addendum)
 Take acetaminophen  as needed for pain.  Your CT scan suggests that you may be constipated.  Please make sure to take your MiraLAX  every day.  If it is not giving you adequate results, you can increase it to twice or even 3 times a day.  Return to the emergency department if your symptoms are getting worse.

## 2023-11-01 NOTE — ED Notes (Signed)
 Patient transported to CT

## 2023-11-07 ENCOUNTER — Other Ambulatory Visit (INDEPENDENT_AMBULATORY_CARE_PROVIDER_SITE_OTHER): Payer: Self-pay | Admitting: Gastroenterology

## 2023-11-09 LAB — H. PYLORI BREATH TEST: H pylori Breath Test: POSITIVE — AB

## 2023-11-11 ENCOUNTER — Other Ambulatory Visit (INDEPENDENT_AMBULATORY_CARE_PROVIDER_SITE_OTHER): Payer: Self-pay | Admitting: Gastroenterology

## 2023-11-11 DIAGNOSIS — B9681 Helicobacter pylori [H. pylori] as the cause of diseases classified elsewhere: Secondary | ICD-10-CM

## 2023-11-11 MED ORDER — AMOXICILLIN 500 MG PO CAPS: 500.0000 mg | ORAL_CAPSULE | Freq: Three times a day (TID) | ORAL | 0 refills | Status: AC

## 2023-11-11 MED ORDER — OMEPRAZOLE 40 MG PO CPDR: 40.0000 mg | DELAYED_RELEASE_CAPSULE | Freq: Three times a day (TID) | ORAL | 0 refills | Status: DC

## 2023-11-12 ENCOUNTER — Telehealth (INDEPENDENT_AMBULATORY_CARE_PROVIDER_SITE_OTHER): Payer: Self-pay

## 2023-11-12 NOTE — Telephone Encounter (Signed)
 Date: 11/12/2023 OSTEN JACOX [address] [place], [place] [address] Member Name: COLBEN DENNO Member ID Number: [id number] Thank you for trusting your Medicare prescription drug coverage to [facility] Value Plus (HMO). As our member, we want to help you get the most value from your prescription drug coverage and help you understand how your coverage works. As a member of [employer] Value Plus (HMO), we are pleased to inform you that, upon review of the information provided by you or your doctor, we have approved the requested coverage for the following prescription drug(s): OMEPRAZOLE  Capsule DR Type of coverage approved: Quantity Limits This approval authorizes your coverage from 07/10/2023 - 07/08/2024, unless we notify you otherwise, and as long as the following conditions apply: ? you remain enrolled in our Medicare Part D prescription drug plan, ? your physician or other prescriber continues to prescribe the medication for you, and ? the medication continues to be safe for treating your condition. This approval for OMEPRAZOLE  40MG  Capsule DR, authorizes you to receive a quantity up to 42 every 14 days, as prescribed by your physician. Safety edits may apply to this approval. To determine if your plan covers an extended day supply, refer to your Evidence of Coverage. If you have not already filled your prescription for this approved drug, you may do so at a participating network pharmacy. Thank you for allowing us  to serve you. This letter is informational only. No further action is required by you at this time. If you have questions or need help, please talk to your doctor or pharmacist, or contact Customer Care at [phone], 24 hours a day, 7 days a week. TTY users may call [phone]. Thank you, [employer]

## 2023-11-17 ENCOUNTER — Encounter (HOSPITAL_COMMUNITY): Payer: Self-pay | Admitting: Emergency Medicine

## 2023-11-17 ENCOUNTER — Emergency Department (HOSPITAL_COMMUNITY)
Admission: EM | Admit: 2023-11-17 | Discharge: 2023-11-18 | Disposition: A | Discharge status: Home / Self Care | Attending: Emergency Medicine | Admitting: Emergency Medicine

## 2023-11-17 ENCOUNTER — Other Ambulatory Visit: Payer: Self-pay

## 2023-11-17 DIAGNOSIS — I10 Essential (primary) hypertension: Secondary | ICD-10-CM | POA: Diagnosis not present

## 2023-11-17 DIAGNOSIS — S0502XA Injury of conjunctiva and corneal abrasion without foreign body, left eye, initial encounter: Secondary | ICD-10-CM | POA: Diagnosis not present

## 2023-11-17 DIAGNOSIS — E119 Type 2 diabetes mellitus without complications: Secondary | ICD-10-CM | POA: Insufficient documentation

## 2023-11-17 DIAGNOSIS — X58XXXA Exposure to other specified factors, initial encounter: Secondary | ICD-10-CM | POA: Diagnosis not present

## 2023-11-17 DIAGNOSIS — H5712 Ocular pain, left eye: Secondary | ICD-10-CM | POA: Diagnosis present

## 2023-11-17 MED ORDER — FLUORESCEIN SODIUM 1 MG OP STRP
1.0000 | ORAL_STRIP | Freq: Once | OPHTHALMIC | Status: AC
  Administered 2023-11-18: 1 via OPHTHALMIC
  Filled 2023-11-17: qty 1

## 2023-11-17 MED ORDER — TETRACAINE HCL 0.5 % OP SOLN
2.0000 [drp] | Freq: Once | OPHTHALMIC | Status: AC
  Administered 2023-11-18: 2 [drp] via OPHTHALMIC
  Filled 2023-11-17: qty 4

## 2023-11-17 NOTE — ED Triage Notes (Signed)
 Pt c/o left eye pain since Saturday.

## 2023-11-17 NOTE — ED Provider Notes (Signed)
 [facility] Emergency Department Provider Note MRN:  [id number]  Arrival date & time: 11/18/23     Chief Complaint   Eye pain History of Present Illness   [name] is a 83 y.o. year-old male with a history of hypertension, diabetes presenting to the ED with chief complaint of eye pain.  Pain and irritation to the left eye, feels like it is scratched or something is in the eye.  Discomfort for the past 2 nights.  Denies trauma.  Review of Systems  A thorough review of systems was obtained and all systems are negative except as noted in the HPI and PMH.   Patient's Health History    Past Medical History:  Diagnosis Date   Arthritis    fingers, back   Chronic back pain    Diabetes mellitus Since 1995   Takes Glucovance  and Onglyza daily   Diabetes mellitus without complication (HCC)    GERD (gastroesophageal reflux disease)    takes Dexilant and Omeprazole  daily   Herpes    History of colon polyps    History of gastric ulcer 25+yrs ago   Hyperlipidemia Since 1995   takes Simvastatin  daily   Hypertension Since 1995   takes Metoprolol  and Ramipril  daily   Hypertension    Pneumonia    hx of;as a child   Sleep apnea    doesn't use CPAP   Urinary frequency    Urinary urgency    takes Flomax  daily    Past Surgical History:  Procedure Laterality Date   BACK SURGERY  1962   Lumbar   BACK SURGERY     bilateral cataract surgery     BIOPSY  02/23/2022   Procedure: BIOPSY;  Surgeon: Urban Garden, MD;  Location: [facility];  Service: Gastroenterology;;   BIOPSY  01/01/2023   Procedure: BIOPSY;  Surgeon: Urban Garden, MD;  Location: [facility];  Service: Gastroenterology;;   CARDIAC CATHETERIZATION  02/16/10   CERVICAL SPINE SURGERY     COLONOSCOPY     COLONOSCOPY N/A 04/23/2013   Procedure: COLONOSCOPY;  Surgeon: Ruby Corporal, MD;  Location: [facility];  Service: Endoscopy;  Laterality: N/A;  1030    COLONOSCOPY N/A 07/24/2018   Procedure: COLONOSCOPY;  Surgeon: Ruby Corporal, MD;  Location: [facility];  Service: Endoscopy;  Laterality: N/A;  8:30   COLONOSCOPY WITH PROPOFOL  N/A 11/28/2021   Procedure: COLONOSCOPY WITH PROPOFOL ;  Surgeon: Urban Garden, MD;  [facility];  Service: Gastroenterology;  Laterality: N/A;  205   ESOPHAGEAL DILATION N/A 02/10/2020   Procedure: ESOPHAGEAL DILATION;  Surgeon: Ruby Corporal, MD;  [facility];  Service: Endoscopy;  Laterality: N/A;   ESOPHAGOGASTRODUODENOSCOPY N/A 12/24/2014   Procedure: ESOPHAGOGASTRODUODENOSCOPY (EGD);  Surgeon: Ruby Corporal, MD;  [facility];  Service: Endoscopy;  Laterality: N/A;  2:45   ESOPHAGOGASTRODUODENOSCOPY (EGD) WITH ESOPHAGEAL DILATION     ESOPHAGOGASTRODUODENOSCOPY (EGD) WITH PROPOFOL  N/A 02/10/2020   Procedure: ESOPHAGOGASTRODUODENOSCOPY (EGD) WITH PROPOFOL ;  Surgeon: Ruby Corporal, MD;  [facility];  Service: Endoscopy;  Laterality: N/A;  250   ESOPHAGOGASTRODUODENOSCOPY (EGD) WITH PROPOFOL  N/A 02/23/2022   Procedure: ESOPHAGOGASTRODUODENOSCOPY (EGD) WITH PROPOFOL ;  Surgeon: Urban Garden, MD;  [facility];  Service: Gastroenterology;  Laterality: N/A;  1030   ESOPHAGOGASTRODUODENOSCOPY (EGD) WITH PROPOFOL  N/A 01/01/2023   Procedure: ESOPHAGOGASTRODUODENOSCOPY (EGD) WITH PROPOFOL ;  Surgeon: Urban Garden, MD;  [facility];  Service: Gastroenterology;  Laterality: N/A;  9:00AM;asa 3   HAND SURGERY     Right   LUMBAR LAMINECTOMY/DECOMPRESSION MICRODISCECTOMY N/A 08/29/2012   Procedure: LUMBAR LAMINECTOMY/DECOMPRESSION MICRODISCECTOMY 1 LEVEL;  Surgeon: Adelbert Adler, MD;  Location: [facility];  Service: Neurosurgery;  Laterality: N/A;  Lumbar four-five laminectomies   NECK SURGERY     PENILE PROSTHESIS IMPLANT     PENILE PROSTHESIS IMPLANT N/A 08/07/2021   Procedure: PENILE PROTHESIS INFLATABLE;  Surgeon:  Marco Severs, MD;  Location: [facility];  Service: Urology;  Laterality: N/A;   POLYPECTOMY  11/28/2021   Procedure: POLYPECTOMY;  Surgeon: Urban Garden, MD;  Location[facility];  Service: Gastroenterology;;   REMOVAL OF PENILE PROSTHESIS N/A 08/07/2021   Procedure: REMOVAL OF PENILE PROSTHESIS;  Surgeon: Marco Severs, MD;  Location: [facility];  Service: Urology;  Laterality: N/A;   ROTATOR CUFF REPAIR     Right   SHOULDER SURGERY     TRANSURETHRAL RESECTION OF PROSTATE N/A 05/25/2019   Procedure: TRANSURETHRAL RESECTION OF THE PROSTATE (TURP);  Surgeon: Marco Severs, MD;  Location: [facility];  Service: Urology;  Laterality: N/A;   WRIST SURGERY     Left   WRIST SURGERY      Family History  Problem Relation Age of Onset   Heart attack Father 43       MI   Hypertension Father    Heart attack Brother 49       MI   Diabetes Mother    Heart attack Paternal Uncle     Social History   Socioeconomic History   Marital status: Widowed    Spouse name: Not on file   Number of children: 8   Years of education: Not on file   Highest education level: Not on file  Occupational History   Occupation: Human resources officer company    Employer: RETIRED    Comment: Retired  Tobacco Use   Smoking status: Never   Smokeless tobacco: Never  Vaping Use   Vaping status: Never Used  Substance and Sexual Activity   Alcohol use: Not Currently    Comment: occasionally    Drug use: Yes    Frequency: 3.0 times per week    Types: Marijuana    Comment: last on 11/26/21   Sexual activity: Never  Other Topics Concern   Not on file  Social History Narrative   ** Merged History Encounter **       Social Drivers of Corporate investment banker Strain: Not on file  Food Insecurity: No Food Insecurity (06/12/2022)   Received from [facility]   Hunger Vital Sign    Worried About Running Out of Food in the Last Year: Never true    Ran Out of Food in the Last Year:  Never true  Transportation Needs: Not on file  Physical Activity: Not on file  Stress: Not on file  Social Connections: Unknown (06/05/2022)   Received from [facility], [facility]   Social Network    Social Network: Not on file  Intimate Partner Violence: Unknown (06/05/2022)   Received from [facility], [facility]   HITS    Physically Hurt: Not on file    Insult or Talk Down To: Not on file    Threaten Physical Harm: Not on file    Scream or Curse: Not on file     Physical Exam   Vitals:   11/17/23 2345 11/18/23 0005  BP:  135/74   Pulse: (!) 57   Resp: 16   Temp:  98.1 F (36.7 C)  SpO2: 97%     CONSTITUTIONAL: Well-appearing, NAD NEURO/PSYCH:  Alert and oriented x 3, no focal deficits EYES:  eyes equal and reactive ENT/NECK:  no LAD, no JVD CARDIO: Regular rate, well-perfused, normal S1 and S2 PULM:  CTAB no wheezing or rhonchi GI/GU:  non-distended, non-tender MSK/SPINE:  No gross deformities, no edema SKIN:  no rash, atraumatic   *Additional and/or pertinent findings included in MDM below  Diagnostic and Interventional Summary    EKG Interpretation Date/Time:    Ventricular Rate:    PR Interval:    QRS Duration:    QT Interval:    QTC Calculation:   R Axis:      Text Interpretation:         Labs Reviewed - No data to display  No orders to display    Medications  fluorescein  ophthalmic strip 1 strip (1 strip Both Eyes Given 11/18/23 0004)  tetracaine  (PONTOCAINE) 0.5 % ophthalmic solution 2 drop (2 drops Both Eyes Given 11/18/23 0004)     Procedures  /  Critical Care Procedures  ED Course and Medical Decision Making  Initial Impression and Ddx Normal extraocular movements and pupillary response, some erythema to the conjunctive on the left, will evaluate more closely with fluorescein  stain.  Past medical/surgical history that increases complexity of ED encounter: None  Interpretation of Diagnostics Laboratory and/or imaging  options to aid in the diagnosis/care of the patient were considered.  After careful history and physical examination, it was determined that there was no indication for diagnostics at this time. Patient Reassessment and Ultimate Disposition/Management     Evidence of conjunctival abrasions with Woods lamp, no loss of visual acuity, doubt glaucoma, appropriate for discharge with follow-up.  Patient management required discussion with the following services or consulting groups:  None  Complexity of Problems Addressed Acute complicated illness or Injury  Additional Data Reviewed and Analyzed Further history obtained from: None  Additional Factors Impacting ED Encounter Risk Prescriptions  Merrick Abe. Harless Lien, MD [facility] Emergency Medicine [facility] [email or web address]  Final Clinical Impressions(s) / ED Diagnoses     ICD-10-CM   1. Abrasion of left conjunctiva, initial encounter  S05.02XA       ED Discharge Orders          Ordered    erythromycin  ophthalmic ointment  4 times daily        11/18/23 0041             Discharge Instructions Discussed with and Provided to Patient:     Discharge Instructions      You were evaluated in the Emergency Department and after careful evaluation, we did not find any emergent condition requiring admission or further testing in the hospital.  Your exam/testing today is overall reassuring.  Recommend using the antibiotic ointment as directed and following up with your eye doctor for repeat exam.  Please return to the Emergency Department if you experience any worsening of your condition.   Thank you for allowing us  to be a part of your care.     Edson Graces, MD 11/18/23 [id number]

## 2023-11-18 MED ORDER — ERYTHROMYCIN 5 MG/GM OP OINT: 1.0000 | TOPICAL_OINTMENT | Freq: Four times a day (QID) | OPHTHALMIC | 0 refills | Status: AC

## 2023-11-18 NOTE — Discharge Instructions (Signed)
 You were evaluated in the Emergency Department and after careful evaluation, we did not find any emergent condition requiring admission or further testing in the hospital.  Your exam/testing today is overall reassuring.  Recommend using the antibiotic ointment as directed and following up with your eye doctor for repeat exam.  Please return to the Emergency Department if you experience any worsening of your condition.   Thank you for allowing us  to be a part of your care.

## 2023-11-21 ENCOUNTER — Encounter (HOSPITAL_COMMUNITY): Payer: Self-pay

## 2023-11-21 ENCOUNTER — Emergency Department (HOSPITAL_COMMUNITY)
Admission: EM | Admit: 2023-11-21 | Discharge: 2023-11-21 | Disposition: A | Discharge status: Home / Self Care | Attending: Emergency Medicine | Admitting: Emergency Medicine

## 2023-11-21 ENCOUNTER — Telehealth (INDEPENDENT_AMBULATORY_CARE_PROVIDER_SITE_OTHER): Payer: Self-pay | Admitting: Gastroenterology

## 2023-11-21 ENCOUNTER — Other Ambulatory Visit: Payer: Self-pay

## 2023-11-21 DIAGNOSIS — I10 Essential (primary) hypertension: Secondary | ICD-10-CM | POA: Insufficient documentation

## 2023-11-21 DIAGNOSIS — D649 Anemia, unspecified: Secondary | ICD-10-CM | POA: Diagnosis not present

## 2023-11-21 DIAGNOSIS — Z7984 Long term (current) use of oral hypoglycemic drugs: Secondary | ICD-10-CM | POA: Insufficient documentation

## 2023-11-21 DIAGNOSIS — Z7982 Long term (current) use of aspirin: Secondary | ICD-10-CM | POA: Diagnosis not present

## 2023-11-21 DIAGNOSIS — K920 Hematemesis: Secondary | ICD-10-CM

## 2023-11-21 DIAGNOSIS — Z79899 Other long term (current) drug therapy: Secondary | ICD-10-CM | POA: Insufficient documentation

## 2023-11-21 DIAGNOSIS — K226 Gastro-esophageal laceration-hemorrhage syndrome: Secondary | ICD-10-CM | POA: Insufficient documentation

## 2023-11-21 LAB — CBC WITH DIFFERENTIAL/PLATELET
Abs Immature Granulocytes: 0.02 10*3/uL (ref 0.00–0.07)
Basophils Absolute: 0 10*3/uL (ref 0.0–0.1)
Basophils Relative: 0 %
Eosinophils Absolute: 0.3 10*3/uL (ref 0.0–0.5)
Eosinophils Relative: 4 %
HCT: 38.1 % — ABNORMAL LOW (ref 39.0–52.0)
Hemoglobin: 12.2 g/dL — ABNORMAL LOW (ref 13.0–17.0)
Immature Granulocytes: 0 %
Lymphocytes Relative: 10 %
Lymphs Abs: 0.8 10*3/uL (ref 0.7–4.0)
MCH: 29.9 pg (ref 26.0–34.0)
MCHC: 32 g/dL (ref 30.0–36.0)
MCV: 93.4 fL (ref 80.0–100.0)
Monocytes Absolute: 0.6 10*3/uL (ref 0.1–1.0)
Monocytes Relative: 8 %
Neutro Abs: 5.5 10*3/uL (ref 1.7–7.7)
Neutrophils Relative %: 78 %
Platelets: 216 10*3/uL (ref 150–400)
RBC: 4.08 MIL/uL — ABNORMAL LOW (ref 4.22–5.81)
RDW: 13.5 % (ref 11.5–15.5)
WBC: 7.2 10*3/uL (ref 4.0–10.5)
nRBC: 0 % (ref 0.0–0.2)

## 2023-11-21 LAB — POC OCCULT BLOOD, ED: Fecal Occult Bld: NEGATIVE

## 2023-11-21 LAB — COMPREHENSIVE METABOLIC PANEL WITH GFR
ALT: 21 U/L (ref 0–44)
AST: 23 U/L (ref 15–41)
Albumin: 3.7 g/dL (ref 3.5–5.0)
Alkaline Phosphatase: 49 U/L (ref 38–126)
Anion gap: 9 (ref 5–15)
BUN: 18 mg/dL (ref 8–23)
CO2: 27 mmol/L (ref 22–32)
Calcium: 9.1 mg/dL (ref 8.9–10.3)
Chloride: 98 mmol/L (ref 98–111)
Creatinine, Ser: 1.08 mg/dL (ref 0.61–1.24)
GFR, Estimated: >60 mL/min (ref >60)
Glucose, Bld: 237 mg/dL — ABNORMAL HIGH (ref 70–99)
Potassium: 4.5 mmol/L (ref 3.5–5.1)
Sodium: 134 mmol/L — ABNORMAL LOW (ref 135–145)
Total Bilirubin: 0.7 mg/dL (ref 0.0–1.2)
Total Protein: 7.6 g/dL (ref 6.5–8.1)

## 2023-11-21 LAB — CBC
HCT: 35.9 % — ABNORMAL LOW (ref 39.0–52.0)
Hemoglobin: 11.9 g/dL — ABNORMAL LOW (ref 13.0–17.0)
MCH: 30.9 pg (ref 26.0–34.0)
MCHC: 33.1 g/dL (ref 30.0–36.0)
MCV: 93.2 fL (ref 80.0–100.0)
Platelets: 216 10*3/uL (ref 150–400)
RBC: 3.85 MIL/uL — ABNORMAL LOW (ref 4.22–5.81)
RDW: 13.6 % (ref 11.5–15.5)
WBC: 6.7 10*3/uL (ref 4.0–10.5)
nRBC: 0 % (ref 0.0–0.2)

## 2023-11-21 LAB — TYPE AND SCREEN
ABO/RH(D): B NEG
Antibody Screen: NEGATIVE

## 2023-11-21 LAB — HEMOCCULT GUIAC POC 1CARD (OFFICE): Fecal Occult Blood, POC: NEGATIVE

## 2023-11-21 MED ORDER — PANTOPRAZOLE SODIUM 40 MG PO TBEC
40.0000 mg | DELAYED_RELEASE_TABLET | Freq: Every day | ORAL | Status: DC
  Administered 2023-11-21: 40 mg via ORAL
  Filled 2023-11-21: qty 1

## 2023-11-21 MED ORDER — PANTOPRAZOLE SODIUM 40 MG PO TBEC: 40.0000 mg | DELAYED_RELEASE_TABLET | Freq: Every day | ORAL | 0 refills | Status: DC

## 2023-11-21 MED ORDER — SUCRALFATE 1 G PO TABS: 1.0000 g | ORAL_TABLET | Freq: Three times a day (TID) | ORAL | Status: DC

## 2023-11-21 NOTE — ED Triage Notes (Signed)
 Pt arrived via POV form home c/o vomiting blood this morning. Pt reports he has an upcoming appointment with his doctor on Monday as well.

## 2023-11-21 NOTE — Telephone Encounter (Signed)
 I spoke with the patient he reports he vomited up blood two times this morning. I made him aware he needed to go to the Ed. Patient states understanding.

## 2023-11-21 NOTE — Telephone Encounter (Signed)
 Patient came to front office asking what to do. He was drinking coffee this morning and threw up and had blood in it. I told if it was bad he needed -to go to the ER and I could make him an OV to come see us  on Monday. [phone]

## 2023-11-21 NOTE — Discharge Instructions (Addendum)
 Take the medicines prescribed.  Stop taking omeprazole . Return to the ER if you start having bloody vomit, bloody stools. GI doctors will see you next week.

## 2023-11-21 NOTE — ED Provider Notes (Signed)
 [place] EMERGENCY DEPARTMENT AT [facility] Provider Note   CSN: [id number] Arrival date & time: 11/21/23  3875     History  Chief Complaint  Patient presents with   Hematemesis    [name] is a 83 y.o. male.  HPI     83 year old male comes in with chief complaint of hematemesis.  Patient has history of hypertension, gastritis because of H. pylori infection.  Patient states that he woke up this morning, had his coffee, went to use the toilet when he suddenly vomited.  He had large vomit with food.  Thereafter he had second vomit with little bit of blood in it.  He also had excruciating epigastric pain, therefore he decided to come to the ER.  Patient has not had any tarry stools.  He does think his stools are slightly dark.  He denies any previous history of bloody stools or bloody vomit that he can recall.  He does currently see GI doctors and informs me that he is supposed to follow-up with them next week.  Patient currently states that he has mild abdominal discomfort.  Home Medications Prior to Admission medications   Medication Sig Start Date End Date Taking? Authorizing Provider  acyclovir (ZOVIRAX) 800 MG tablet Take 800 mg by mouth 2 (two) times daily. 10/03/23  Yes [provider]  albuterol  (VENTOLIN  HFA) 108 (90 Base) MCG/ACT inhaler SMARTSIG:2 Puff(s) Via Inhaler 4 Times Daily PRN 07/29/23  Yes [provider]  metFORMIN  (GLUCOPHAGE ) 500 MG tablet Take 500 mg by mouth 2 (two) times daily. 11/15/23  Yes [provider]  pantoprazole  (PROTONIX ) 40 MG tablet Take 1 tablet (40 mg total) by mouth daily. 11/21/23  Yes Deatra Face, MD  acyclovir (ZOVIRAX) 200 MG capsule Take 200 mg by mouth as needed. 01/07/14   [provider]  alfuzosin  (UROXATRAL ) 10 MG 24 hr tablet Take 1 tablet (10 mg total) by mouth at bedtime. 06/14/23   McKenzie, Arden Beck, MD  amLODipine  (NORVASC ) 10 MG tablet Take 10 mg by mouth daily.     [provider]  amoxicillin  (AMOXIL ) 500 MG capsule Take 1 capsule (500 mg total) by mouth 3 (three) times daily for 14 days. 11/11/23 11/25/23  Urban Garden, MD  aspirin  EC 81 MG tablet Take 81 mg by mouth daily. Swallow whole.    [provider]  Blood Glucose Monitoring Suppl (ONETOUCH VERIO FLEX SYSTEM) w/Device KIT USE TO TEST TWICE DAILY 09/30/19   [provider]  buprenorphine (BUTRANS) 15 MCG/HR Place onto the skin once a week.    [provider]  cetirizine (ZYRTEC) 10 MG tablet Take 10 mg by mouth daily. 01/12/20   [provider]  diclofenac  Sodium (VOLTAREN ) 1 % GEL Apply 4 g topically 4 (four) times daily. Patient not taking: Reported on 10/21/2023 04/11/22   Fae Homans R, PA-C  dicyclomine  (BENTYL ) 10 MG capsule Take 1 capsule (10 mg total) by mouth every 12 (twelve) hours as needed (abdominal pain). Patient not taking: Reported on 10/21/2023 01/15/22   Castaneda Mayorga, Daniel, MD  erythromycin  ophthalmic ointment Place 1 Application into the left eye 4 (four) times daily for 5 days. Place a 1/2 inch ribbon of ointment into the lower eyelid. 11/18/23 11/23/23  Edson Graces, MD  famotidine  (PEPCID ) 40 MG tablet Take 40 mg by mouth daily.    [provider]  gabapentin  (NEURONTIN ) 300 MG capsule Take 300 mg by mouth 2 (two) times daily. 12/29/21  [provider]  glyBURIDE -metformin  (GLUCOVANCE ) 5-500 MG per tablet Take 2 tablets by mouth 2 (two) times daily with a meal.    [provider]  hydrochlorothiazide (HYDRODIURIL) 12.5 MG tablet Take 12.5 mg by mouth daily. 07/20/21   [provider]  JARDIANCE 25 MG TABS tablet Take 25 mg by mouth daily. 08/11/21   [provider]  labetalol  (NORMODYNE ) 200 MG tablet Take 200 mg by mouth daily at 6 (six) AM.    [provider]  Lancets (ONETOUCH DELICA PLUS LANCET33G) MISC USE TO TEST TWICE DAILY 09/30/19   [provider]   loratadine (CLARITIN) 10 MG tablet Take 10 mg by mouth daily.    [provider]  magic mouthwash (nystatin , lidocaine , diphenhydrAMINE , alum & mag hydroxide) suspension Swish and spit 5 mLs 3 (three) times daily as needed for mouth pain. Patient not taking: Reported on 10/21/2023 05/14/23   Carlan, Chelsea L, NP  metoprolol  succinate (TOPROL -XL) 50 MG 24 hr tablet Take 50 mg by mouth daily. Take with or immediately following a meal.    [provider]  nystatin  (MYCOSTATIN ) 100000 UNIT/ML suspension Use as directed 5 mLs (500,000 Units total) in the mouth or throat 4 (four) times daily. Swish and swallow Patient not taking: Reported on 10/21/2023 01/01/23   Umberto Ganong, Bearl Limes, MD  [facility] VERIO test strip USE TO TEST TWICE DAILY 09/30/19   [provider]  oxyCODONE -acetaminophen  (PERCOCET) 10-325 MG tablet Take 1 tablet by mouth 2 (two) times daily as needed.    [provider]  polyethylene glycol powder (GLYCOLAX ) 17 GM/SCOOP powder Take 17 g by mouth daily. Patient not taking: Reported on 10/21/2023 04/17/21   Urban Garden, MD  ramipril  (ALTACE ) 10 MG capsule Take 10 mg by mouth daily.  02/05/14   [provider]  simvastatin  (ZOCOR ) 20 MG tablet Take 20 mg by mouth daily. 04/15/13   [provider]  bismuth -metronidazole -tetracycline  (PLYERA) 140-125-125 MG per capsule Take 3 capsules by mouth 4 (four) times daily -  before meals and at bedtime.  09/26/11  [provider]      Allergies    Aspirin  and Bayer aspirin  Abisha.Abraham ]    Review of Systems   Review of Systems  All other systems reviewed and are negative.   Physical Exam Updated Vital Signs BP 122/80   Pulse (!) 59   Temp 98.2 F (36.8 C) (Oral)   Resp 18   Ht 5\' 10"  (1.778 m)   Wt 65.5 kg   SpO2 99%   BMI 20.72 kg/m  Physical Exam Vitals and nursing note reviewed.  Constitutional:      Appearance: He is well-developed.  HENT:     Head:  Atraumatic.  Cardiovascular:     Rate and Rhythm: Normal rate.  Pulmonary:     Effort: Pulmonary effort is normal.  Abdominal:     Tenderness: There is no abdominal tenderness.  Musculoskeletal:     Cervical back: Neck supple.  Skin:    General: Skin is warm.  Neurological:     Mental Status: He is alert and oriented to person, place, and time.     ED Results / Procedures / Treatments   Labs (all labs ordered are listed, but only abnormal results are displayed) Labs Reviewed  CBC WITH DIFFERENTIAL/PLATELET - Abnormal; Notable for the following components:      Result Value   RBC 4.08 (*)    Hemoglobin 12.2 (*)    HCT 38.1 (*)  All other components within normal limits  COMPREHENSIVE METABOLIC PANEL WITH GFR - Abnormal; Notable for the following components:   Sodium 134 (*)    Glucose, Bld 237 (*)    All other components within normal limits  CBC - Abnormal; Notable for the following components:   RBC 3.85 (*)    Hemoglobin 11.9 (*)    HCT 35.9 (*)    All other components within normal limits  POC OCCULT BLOOD, ED  TYPE AND SCREEN    EKG None  Radiology No results found.  Procedures Procedures    Medications Ordered in ED Medications  sucralfate  (CARAFATE ) tablet 1 g (has no administration in time range)  pantoprazole  (PROTONIX ) EC tablet 40 mg (40 mg Oral Given 11/21/23 1517)    ED Course/ Medical Decision Making/ A&P                                 Medical Decision Making Amount and/or Complexity of Data Reviewed Labs: ordered.  Risk Prescription drug management.   This patient presents to the ED with chief complaint(s) of bloody emesis with pertinent past medical history of gastritis, H. pylori infection.patient does not take any anticoagulation.The complaint involves an extensive differential diagnosis and also carries with it a high risk of complications and morbidity.    The differential diagnosis considered for this patient includes:   Esophagitis Mallory Weiss tear [place]ave  Variceal bleeding PUD/Gastritis/ulcers Diverticular bleed Colon cancer Rectal bleed Internal hemorrhoids External hemorrhoids  The initial plan is to get basic labs, Hemoccult exam.  Additional history obtained: Records reviewed previous admission documents and previous GI note and endoscopy from last year which revealed gastritis.  Independent labs interpretation:  The following labs were independently interpreted: Patient's initial hemoglobin is 12.2, which is higher than his normal hemoglobin.  He does not have any peritonitis on exam.  Metabolic profile, BUN is normal.  Treatment and Reassessment: Patient reassessment on 2 separate occasions.  He has not had any emesis in the ER.  Repeat hemoglobin is stable as well.  I spoke with Dr. Aquilla Knapp, GI.  He recommends switching patient to Protonix  instead of omeprazole .  They will see the patient in the clinic.  This recommendation has been discussed with the patient.  He is comfortable with discharge with return precautions.    Final Clinical Impression(s) / ED Diagnoses Final diagnoses:  Mallory-Weiss tear  Hematemesis, unspecified whether nausea present    Rx / DC Orders ED Discharge Orders          Ordered    POCT occult blood stool       Comments: This order was created through External Result Entry    11/21/23 1350    pantoprazole  (PROTONIX ) 40 MG tablet  Daily        11/21/23 1545              Deatra Face, MD 11/21/23 1725

## 2023-11-21 NOTE — ED Notes (Signed)
 Pt has GI apt coming up due to "infection in my stomach". States has chronic constipation that he takes mirilax for. Denies blood in stool. C/o vomiting with blood in it x 2 this am. States was bright red. Color wnl. A/o. No gen weakness noted. Ambulatory with steady gait. Nad at this time.

## 2023-11-25 ENCOUNTER — Encounter (INDEPENDENT_AMBULATORY_CARE_PROVIDER_SITE_OTHER): Payer: Self-pay | Admitting: Gastroenterology

## 2023-11-25 ENCOUNTER — Telehealth (INDEPENDENT_AMBULATORY_CARE_PROVIDER_SITE_OTHER): Payer: Self-pay | Admitting: Gastroenterology

## 2023-11-25 ENCOUNTER — Ambulatory Visit (INDEPENDENT_AMBULATORY_CARE_PROVIDER_SITE_OTHER): Admitting: Gastroenterology

## 2023-11-25 VITALS — BP 136/68 | HR 68 | Temp 97.3°F | Ht 70.0 in | Wt 140.1 lb

## 2023-11-25 DIAGNOSIS — B9681 Helicobacter pylori [H. pylori] as the cause of diseases classified elsewhere: Secondary | ICD-10-CM

## 2023-11-25 DIAGNOSIS — A048 Other specified bacterial intestinal infections: Secondary | ICD-10-CM | POA: Diagnosis not present

## 2023-11-25 DIAGNOSIS — K920 Hematemesis: Secondary | ICD-10-CM | POA: Diagnosis not present

## 2023-11-25 MED ORDER — PANTOPRAZOLE SODIUM 40 MG PO TBEC: 40.0000 mg | DELAYED_RELEASE_TABLET | Freq: Two times a day (BID) | ORAL | 1 refills | Status: DC

## 2023-11-25 NOTE — Telephone Encounter (Signed)
 Attempted to reach pt to schedule EGD (ASA 3 Castaneda around 12/26/23) but no answer

## 2023-11-25 NOTE — Progress Notes (Addendum)
 Referring Provider: Fanta, Tesfaye Demissie* Primary Care Physician:  Fanta, Tesfaye Demissie, MD Primary GI Physician: Dr. Sammi Crick   Chief Complaint  Patient presents with   Hematemesis    Patient here today for a follow up from 11/21/2023 Ed visit due to Hematemesis. Patient denies any sight of bright red blood or vomiting since 11/21/2023.   HPI:   [name] is a 83 y.o. male with past medical history of  GERD, diabetes, Schatzki's ring, hypertension, opioid-induced constipation, esophageal dysmotility, hyperlipidemia and OSA   Patient presenting today for:  Hematemesis, H pylori  Last seen April 2025, at that time H pylori testing NEG in November, unclear if he stopped PPI prior to testing. Reported ongoing abdominal pain despite taking h pylori therapy. Taking pepto for this. 1-2 BMs per day that are smaller.   Recommended to have repeat H pylori testing, stop PPI and H2B 2 weeks prior to, start miralax   H pylori testing on 5/1 was positive, advised to do high dose dual amoxicillin  PPI combination   Patient presented to the window of our office on 5/15 with reports of hematemesis after drinking coffee, was advised to proceed to the ER   He was seen in the ED, case was discussed with Dr. Alita Irwin who recommended switchign to protonix  and pt to be seen in clinic for close follow up   Hgb was 11.9 (baseline 10-12 range) FOBT negative   Present:  Patient states that after eating breakfast and drinking a cup of coffee last Thursday he watching tv and began feeling sick, he vomited once which appeared darker, like the food he had eaten and then on the second episode of vomiting he noted some streaks of bright red blood mixed In. He endorses some abdominal pain at that time. Notes he is feeling some better since being on the protonix  as far as abdominal pain. Has not seen any more hematemesis. He does note a bad taste in his mouth recently. Denies heartburn or acid coming up.  He notes that he feels something is stuck in his epigastric region at times, can sometimes cough up some phlegm which improves his symptoms. Denies dysphagia or foods getting stuck other than steak on occasion. Appetite is improved, weight stable. Denies NSAID use.    Last EGD: 01/01/2023 - Normal esophagus. - Gastric mucosal atrophy. Biopsied. - Normal examined duodenum. Pathology showed H. Pylori gastritis.   Last Colonoscopy:11/28/2021 The perianal and digital rectal examinations were normal. Six sessile polyps were found in the ascending colon. The polyps were 2 to 6 mm in size. These polyps were removed with a cold snare. Resection and retrieval were complete for 3 polyps, but the other 3 could not be retrieved (fully resected). The ascending colon and cecum appeared normal othwerwise. CT scan findings likely related to stool. Non-bleeding internal hemorrhoids were found during retroflexion. The hemorrhoids were small. Pathology consistent with tubular adenomas.   Not Recommended to have a repeat colonoscopy given patient comorbidities.   Filed Weights   11/25/23 1153  Weight: 140 lb 1.6 oz (63.5 kg)     Past Medical History:  Diagnosis Date   Arthritis    fingers, back   Chronic back pain    Diabetes mellitus Since 1995   Takes Glucovance  and Onglyza daily   Diabetes mellitus without complication (HCC)    GERD (gastroesophageal reflux disease)    takes Dexilant and Omeprazole  daily   Herpes    History of colon polyps    History  of gastric ulcer 25+yrs ago   Hyperlipidemia Since 1995   takes Simvastatin  daily   Hypertension Since 1995   takes Metoprolol  and Ramipril  daily   Hypertension    Pneumonia    hx of;as a child   Sleep apnea    doesn't use CPAP   Urinary frequency    Urinary urgency    takes Flomax  daily    Past Surgical History:  Procedure Laterality Date   BACK SURGERY  1962   Lumbar   BACK SURGERY     bilateral cataract surgery     BIOPSY   02/23/2022   Procedure: BIOPSY;  Surgeon: Urban Garden, MD;  [facility];  Service: Gastroenterology;;   BIOPSY  01/01/2023   Procedure: BIOPSY;  Surgeon: Urban Garden, MD;  [facility]: [facility];  Service: Gastroenterology;;   CARDIAC CATHETERIZATION  02/16/10   CERVICAL SPINE SURGERY     COLONOSCOPY     COLONOSCOPY N/A 04/23/2013   Procedure: COLONOSCOPY;  Surgeon: Ruby Corporal, MD;  [facility]: [facility];  Service: Endoscopy;  Laterality: N/A;  1030   COLONOSCOPY N/A 07/24/2018   Procedure: COLONOSCOPY;  Surgeon: Ruby Corporal, MD;  [facility];  Service: Endoscopy;  Laterality: N/A;  8:30   COLONOSCOPY WITH PROPOFOL  N/A 11/28/2021   Procedure: COLONOSCOPY WITH PROPOFOL ;  Surgeon: Urban Garden, MD;  [facility];  Service: Gastroenterology;  Laterality: N/A;  205   ESOPHAGEAL DILATION N/A 02/10/2020   Procedure: ESOPHAGEAL DILATION;  Surgeon: Ruby Corporal, MD;  [facility];  Service: Endoscopy;  Laterality: N/A;   ESOPHAGOGASTRODUODENOSCOPY N/A 12/24/2014   Procedure: ESOPHAGOGASTRODUODENOSCOPY (EGD);  Surgeon: Ruby Corporal, MD;  [facility];  Service: Endoscopy;  Laterality: N/A;  2:45   ESOPHAGOGASTRODUODENOSCOPY (EGD) WITH ESOPHAGEAL DILATION     ESOPHAGOGASTRODUODENOSCOPY (EGD) WITH PROPOFOL  N/A 02/10/2020   Procedure: ESOPHAGOGASTRODUODENOSCOPY (EGD) WITH PROPOFOL ;  Surgeon: Ruby Corporal, MD;  [facility];  Service: Endoscopy;  Laterality: N/A;  250   ESOPHAGOGASTRODUODENOSCOPY (EGD) WITH PROPOFOL  N/A 02/23/2022   Procedure: ESOPHAGOGASTRODUODENOSCOPY (EGD) WITH PROPOFOL ;  Surgeon: Urban Garden, MD;  [facility];  Service: Gastroenterology;  Laterality: N/A;  1030   ESOPHAGOGASTRODUODENOSCOPY (EGD) WITH PROPOFOL  N/A 01/01/2023   Procedure: ESOPHAGOGASTRODUODENOSCOPY (EGD) WITH PROPOFOL ;  Surgeon: Urban Garden, MD;  [facility];  Service: Gastroenterology;  Laterality: N/A;  9:00AM;asa 3   HAND SURGERY     Right   LUMBAR LAMINECTOMY/DECOMPRESSION MICRODISCECTOMY N/A 08/29/2012   Procedure: LUMBAR LAMINECTOMY/DECOMPRESSION MICRODISCECTOMY 1 LEVEL;  Surgeon: Adelbert Adler, MD;  Location: [facility];  Service: Neurosurgery;  Laterality: N/A;  Lumbar four-five laminectomies   NECK SURGERY     PENILE PROSTHESIS IMPLANT     PENILE PROSTHESIS IMPLANT N/A 08/07/2021   Procedure: PENILE PROTHESIS INFLATABLE;  Surgeon: Marco Severs, MD;  Location: [facility];  Service: Urology;  Laterality: N/A;   POLYPECTOMY  11/28/2021   Procedure: POLYPECTOMY;  Surgeon: Urban Garden, MD;  [facility];  Service: Gastroenterology;;   REMOVAL OF PENILE PROSTHESIS N/A 08/07/2021   Procedure: REMOVAL OF PENILE PROSTHESIS;  Surgeon: Marco Severs, MD;  Location: [facility];  Service: Urology;  Laterality: N/A;   ROTATOR CUFF REPAIR     Right   SHOULDER SURGERY     TRANSURETHRAL RESECTION OF PROSTATE N/A 05/25/2019   Procedure: TRANSURETHRAL RESECTION OF THE PROSTATE (TURP);  Surgeon: Marco Severs,  MD;  Location: [facility];  Service: Urology;  Laterality: N/A;   WRIST SURGERY     Left   WRIST SURGERY      Current Outpatient Medications  Medication Sig Dispense Refill   acyclovir (ZOVIRAX) 200 MG capsule Take 200 mg by mouth as needed.     acyclovir (ZOVIRAX) 800 MG tablet Take 800 mg by mouth 2 (two) times daily.     albuterol  (VENTOLIN  HFA) 108 (90 Base) MCG/ACT inhaler SMARTSIG:2 Puff(s) Via Inhaler 4 Times Daily PRN     alfuzosin  (UROXATRAL ) 10 MG 24 hr tablet Take 1 tablet (10 mg total) by mouth at bedtime. 30 tablet 11   amLODipine  (NORVASC ) 10 MG tablet Take 10 mg by mouth daily.     amoxicillin  (AMOXIL ) 500 MG capsule Take 1 capsule (500 mg total) by mouth 3 (three) times daily for 14 days. 42 capsule 0   Blood Glucose Monitoring Suppl (ONETOUCH VERIO FLEX SYSTEM) w/Device KIT USE TO  TEST TWICE DAILY     buprenorphine (BUTRANS) 15 MCG/HR Place onto the skin once a week.     cetirizine (ZYRTEC) 10 MG tablet Take 10 mg by mouth daily.     diclofenac  Sodium (VOLTAREN ) 1 % GEL Apply 4 g topically 4 (four) times daily. 150 g 0   dicyclomine  (BENTYL ) 10 MG capsule Take 1 capsule (10 mg total) by mouth every 12 (twelve) hours as needed (abdominal pain). 60 capsule 2   gabapentin  (NEURONTIN ) 300 MG capsule Take 300 mg by mouth 2 (two) times daily.     Lancets (ONETOUCH DELICA PLUS LANCET33G) MISC USE TO TEST TWICE DAILY     loratadine (CLARITIN) 10 MG tablet Take 10 mg by mouth daily.     magic mouthwash (nystatin , lidocaine , diphenhydrAMINE , alum & mag hydroxide) suspension Swish and spit 5 mLs 3 (three) times daily as needed for mouth pain. 210 mL 0   metFORMIN  (GLUCOPHAGE ) 500 MG tablet Take 500 mg by mouth 2 (two) times daily.     ONETOUCH VERIO test strip USE TO TEST TWICE DAILY     oxyCODONE -acetaminophen  (PERCOCET) 10-325 MG tablet Take 1 tablet by mouth 2 (two) times daily as needed.     polyethylene glycol powder (GLYCOLAX ) 17 GM/SCOOP powder Take 17 g by mouth daily. 850 g 3   ramipril  (ALTACE ) 10 MG capsule Take 10 mg by mouth daily.      simvastatin  (ZOCOR ) 20 MG tablet Take 20 mg by mouth daily.     aspirin  EC 81 MG tablet Take 81 mg by mouth daily. Swallow whole. (Patient not taking: Reported on 11/25/2023)     famotidine  (PEPCID ) 40 MG tablet Take 40 mg by mouth daily.     glyBURIDE -metformin  (GLUCOVANCE ) 5-500 MG per tablet Take 2 tablets by mouth 2 (two) times daily with a meal.     hydrochlorothiazide (HYDRODIURIL) 12.5 MG tablet Take 12.5 mg by mouth daily.     JARDIANCE 25 MG TABS tablet Take 25 mg by mouth daily.     labetalol  (NORMODYNE ) 200 MG tablet Take 200 mg by mouth daily at 6 (six) AM.     metoprolol  succinate (TOPROL -XL) 50 MG 24 hr tablet Take 50 mg by mouth daily. Take with or immediately following a meal.     nystatin  (MYCOSTATIN ) 100000 UNIT/ML  suspension Use as directed 5 mLs (500,000 Units total) in the mouth or throat 4 (four) times daily. Swish and swallow (Patient not taking: Reported on 05/14/2023) 240 mL 0   pantoprazole  (PROTONIX )  40 MG tablet Take 1 tablet (40 mg total) by mouth daily. 30 tablet 0   No current facility-administered medications for this visit.    Allergies as of 11/25/2023 - Review Complete 11/25/2023  Allergen Reaction Noted   Aspirin  Other (See Comments)    Bayer aspirin  [aspirin ] Other (See Comments) 06/20/2013    Social History   Socioeconomic History   Marital status: Widowed    Spouse name: Not on file   Number of children: 8   Years of education: Not on file   Highest education level: Not on file  Occupational History   Occupation: Human resources officer company    Employer: RETIRED    Comment: Retired  Tobacco Use   Smoking status: Never   Smokeless tobacco: Never  Vaping Use   Vaping status: Never Used  Substance and Sexual Activity   Alcohol use: Not Currently    Comment: occasionally    Drug use: Yes    Frequency: 3.0 times per week    Types: Marijuana    Comment: last on 11/26/21   Sexual activity: Never  Other Topics Concern   Not on file  Social History Narrative   ** Merged History Encounter **       Social Drivers of Corporate investment banker Strain: Not on file  Food Insecurity: No Food Insecurity (06/12/2022)   Received from [employer], [facility]   Hunger Vital Sign    Worried About Running Out of Food in the Last Year: Never true    Ran Out of Food in the Last Year: Never true  Transportation Needs: Not on file  Physical Activity: Not on file  Stress: Not on file  Social Connections: Unknown (06/05/2022)   Received from [facility], [facility]   Social Network    Social Network: Not on file    Review of systems General: negative for malaise, night sweats, fever, chills, weight loss Neck: Negative for lumps, goiter, pain and significant neck  swelling Resp: Negative for cough, wheezing, dyspnea at rest CV: Negative for chest pain, leg swelling, palpitations, orthopnea GI: denies melena, hematochezia, nausea, vomiting, diarrhea, constipation, dysphagia, odyonophagia, early satiety or unintentional weight loss. +discomfort in epigastric region +hematemesis  The remainder of the review of systems is noncontributory.  Physical Exam: BP 136/68 (BP Location: Left Arm, Patient Position: Sitting, Cuff Size: Normal)   Pulse 68   Temp (!) 97.3 F (36.3 C) (Temporal)   Ht 5\' 10"  (1.778 m)   Wt 140 lb 1.6 oz (63.5 kg)   BMI 20.10 kg/m  General:   Alert and oriented. No distress noted. Pleasant and cooperative.  Head:  Normocephalic and atraumatic. Eyes:  Conjuctiva clear without scleral icterus. Mouth:  Oral mucosa pink and moist. Good dentition. No lesions. Heart: Normal rate and rhythm, s1 and s2 heart sounds present.  Lungs: Clear lung sounds in all lobes. Respirations equal and unlabored. Abdomen:  +BS, soft, and non-distended. TTP of epigastric region. No rebound or guarding. No HSM or masses noted. Derm: No palmar erythema or jaundice Msk:  Symmetrical without gross deformities. Normal posture. Extremities:  Without edema. Neurologic:  Alert and  oriented x4 Psych:  Alert and cooperative. Normal mood and affect.  Invalid input(s): "6 MONTHS"   ASSESSMENT: [name] is a 83 y.o. male presenting today for hematemesis  Episode of hematemesis on 5/15, hgb stable at that time. No further episodes. History of recurrent H pylori, recently started on dual high dose amoxicillin  therapy which  he will complete this week. PPI was changed from Omeprazole  40mg  TID to Protonix  40mg  in the ED on 5/15. I am recommended he take protonix  40mg  twice daily for now. Given episode of hematemesis would recommend proceeding with EGD, as he has known H pylori, cannot rule out bleeding ulcer, or hematemesis from gastritis, could have a MWT from  initial episode of vomiting. Should complete H pylori therapy as prescribed, plan for EGD in about 1 month, hold PPI 2 weeks prior to EGD to ensure accurate H pylori testing on biopsy. If he has recurrence of hematemesis, he needs to proceed to the ED for more urgent evaluation. Indications, risks and benefits of procedure discussed in detail with patient. Patient verbalized understanding and is in agreement to proceed with EGD.     PLAN:  -continue H pylori treatment regimen to completion  -continue Protonix  40mg , take this BID  -schedule EGD in 1 month ASA III -hold PPI 2 weeks prior to EGD  All questions were answered, patient verbalized understanding and is in agreement with plan as outlined above.   Follow Up: Has follow up on 7/22  Mahli Glahn L. Adrien Alberta, MSN, APRN, AGNP-C Adult-Gerontology Nurse Practitioner [facility]  I have reviewed the note and agree with the APP's assessment as described in this progress note  [name], MD Gastroenterology and Hepatology [facility]

## 2023-11-25 NOTE — Patient Instructions (Signed)
 Continue taking antibiotics for H pylori I am going to increase your protonix  to twice a day We will schedule EGD given blood in your vomit You need to stop your protonix  (acid reflux pill) 2 weeks before EGD  You have a follow up in July. If you have recurrent blood in your vomit, you need to go back to the ED

## 2023-11-26 NOTE — Telephone Encounter (Signed)
 Left message to return call

## 2023-11-27 NOTE — Telephone Encounter (Signed)
 Contacted pt. Pt picked up and states "what they want now". Asked could I speak to [place]. Not sure if pt could hear me or not. Phone hung up at that time. Will send letter.

## 2023-12-05 ENCOUNTER — Encounter (INDEPENDENT_AMBULATORY_CARE_PROVIDER_SITE_OTHER): Payer: Self-pay | Admitting: *Deleted

## 2023-12-05 ENCOUNTER — Encounter: Payer: Self-pay | Admitting: *Deleted

## 2023-12-12 ENCOUNTER — Telehealth (INDEPENDENT_AMBULATORY_CARE_PROVIDER_SITE_OTHER): Payer: Self-pay | Admitting: Gastroenterology

## 2023-12-12 ENCOUNTER — Other Ambulatory Visit (INDEPENDENT_AMBULATORY_CARE_PROVIDER_SITE_OTHER): Payer: Self-pay

## 2023-12-12 MED ORDER — PANTOPRAZOLE SODIUM 40 MG PO TBEC: 40.0000 mg | DELAYED_RELEASE_TABLET | Freq: Two times a day (BID) | ORAL | 1 refills | Status: AC

## 2023-12-12 NOTE — Telephone Encounter (Signed)
 Pt came to window and asked about refill on Pantoprazole . Bottle that pt brought was dated for 11/25/23. Per [facility] note, take Protonix  40 mg BID. Bottle pt brought in had once a day. Refill sent to [facility] to notate BID.

## 2023-12-18 ENCOUNTER — Telehealth: Payer: Self-pay | Admitting: Gastroenterology

## 2023-12-18 NOTE — Telephone Encounter (Signed)
 Patient seen recently by [facility] and is scheduled a procedure on 6/24. He came to front desk saying he has been spitting up stuff with black stuff in it and didn't know if he needed to be checked out. Please advise. I told him all nurses were with patients and someone would call him. [phone]

## 2023-12-18 NOTE — Telephone Encounter (Signed)
 Pt left message returning call. Pt states he went to use the bathroom and got a mouthful of slobber and he then spit up. Pt states it looked like an oreo cookie. Just started this morning. No blood that pt is aware of. Pt states his stomach is hurting like crazy.  Please advise. Thank you.

## 2023-12-18 NOTE — Telephone Encounter (Signed)
Left message to return call to get more details.

## 2023-12-18 NOTE — Telephone Encounter (Signed)
 Pt states has not been taking any kind of NSAIDS. Is taking Protonix  BID. Pt is thankful that we will be sending in Carafate . Advised pt that he would need to go to ED for further evaluation if worsening pain or persistent vomiting or dark substances. Pt verbalized understanding.

## 2023-12-19 ENCOUNTER — Other Ambulatory Visit (INDEPENDENT_AMBULATORY_CARE_PROVIDER_SITE_OTHER): Payer: Self-pay | Admitting: Gastroenterology

## 2023-12-19 MED ORDER — SUCRALFATE 1 G PO TABS: 1.0000 g | ORAL_TABLET | Freq: Four times a day (QID) | ORAL | 0 refills | Status: DC

## 2023-12-25 NOTE — Patient Instructions (Signed)
 [name]  12/25/2023     @PREFPERIOPPHARMACY @   Your procedure is scheduled on  12/31/2023.   Report to [facility] Done at  [phone]  A.M.   Call this number if you have problems the morning of surgery:  [phone]  If you experience any cold or flu symptoms such as cough, fever, chills, shortness of breath, etc. between now and your scheduled surgery, please notify us  at the above number.   Remember:         Use your inhaler before you come and bring your rescue inhaler with you.       DO NOT take any medications for diabetes the morning of your procedure.   Follow the diet instructions given to you by the office.   You may drink clear liquids until 0400 an on 12/31/2023.    Clear liquids allowed are:                    Water , Juice (No red color; non-citric and without pulp; diabetics please choose diet or no sugar options), Carbonated beverages (diabetics please choose diet or no sugar options), Clear Tea (No creamer, milk, or cream, including half & half and powdered creamer), Black Coffee Only (No creamer, milk or cream, including half & half and powdered creamer), and Clear Sports drink (No red color; diabetics please choose diet or no sugar options)    Take these medicines the morning of surgery with A SIP OF WATER          alfusozin, famotidine , gabapentin , labetolol, oxycodone (if needed), protonix .    Do not wear jewelry, make-up or nail polish, including gel polish,  artificial nails, or any other type of covering on natural nails (fingers and  toes).  Do not wear lotions, powders, or perfumes, or deodorant.  Do not shave 48 hours prior to surgery.  Men may shave face and neck.  Do not bring valuables to the hospital.  [facility] is not responsible for any belongings or valuables.  Contacts, dentures or bridgework may not be worn into surgery.  Leave your suitcase in the car.  After surgery it may be brought to your room.  For patients admitted to the  hospital, discharge time will be determined by your treatment team.  Patients discharged the day of surgery will not be allowed to drive home and must have someone with them for 24 hours.    Special instructions:  DO NOT smoke tobacco or vape for 24 hours before your procedure.  Please read over the following fact sheets that you were given. Anesthesia Post-op Instructions and Care and Recovery After Surgery      Upper Endoscopy, Adult, Care After After the procedure, it is common to have a sore throat. It is also common to have: Mild stomach pain or discomfort. Bloating. Nausea. Follow these instructions at home: The instructions below may help you care for yourself at home. Your health care provider may give you more instructions. If you have questions, ask your health care provider. If you were given a sedative during the procedure, it can affect you for several hours. Do not drive or operate machinery until your health care provider says that it is safe. If you will be going home right after the procedure, plan to have a responsible adult: Take you home from the hospital or clinic. You will not be allowed to drive. Care for you for the time you are told. Follow instructions from  your health care provider about what you may eat and drink. Return to your normal activities as told by your health care provider. Ask your health care provider what activities are safe for you. Take over-the-counter and prescription medicines only as told by your health care provider. Contact a health care provider if you: Have a sore throat that lasts longer than one day. Have trouble swallowing. Have a fever. Get help right away if you: Vomit blood or your vomit looks like coffee grounds. Have bloody, black, or tarry stools. Have a very bad sore throat or you cannot swallow. Have difficulty breathing or very bad pain in your chest or abdomen. These symptoms may be an emergency. Get help right away.  Call 911. Do not wait to see if the symptoms will go away. Do not drive yourself to the hospital. Summary After the procedure, it is common to have a sore throat, mild stomach discomfort, bloating, and nausea. If you were given a sedative during the procedure, it can affect you for several hours. Do not drive until your health care provider says that it is safe. Follow instructions from your health care provider about what you may eat and drink. Return to your normal activities as told by your health care provider. This information is not intended to replace advice given to you by your health care provider. Make sure you discuss any questions you have with your health care provider. Document Revised: 10/04/2021 Document Reviewed: 10/04/2021 Elsevier Patient Education  2024 [employer]General Anesthesia, Adult, Care After The following information offers guidance on how to care for yourself after your procedure. Your health care provider may also give you more specific instructions. If you have problems or questions, contact your health care provider. What can I expect after the procedure? After the procedure, it is common for people to: Have pain or discomfort at the IV site. Have nausea or vomiting. Have a sore throat or hoarseness. Have trouble concentrating. Feel cold or chills. Feel weak, sleepy, or tired (fatigue). Have soreness and body aches. These can affect parts of the body that were not involved in surgery. Follow these instructions at home: For the time period you were told by your health care provider:  Rest. Do not participate in activities where you could fall or become injured. Do not drive or use machinery. Do not drink alcohol. Do not take sleeping pills or medicines that cause drowsiness. Do not make important decisions or sign legal documents. Do not take care of children on your own. General instructions Drink enough fluid to keep your urine pale yellow. If  you have sleep apnea, surgery and certain medicines can increase your risk for breathing problems. Follow instructions from your health care provider about wearing your sleep device: Anytime you are sleeping, including during daytime naps. While taking prescription pain medicines, sleeping medicines, or medicines that make you drowsy. Return to your normal activities as told by your health care provider. Ask your health care provider what activities are safe for you. Take over-the-counter and prescription medicines only as told by your health care provider. Do not use any products that contain nicotine or tobacco. These products include cigarettes, chewing tobacco, and vaping devices, such as e-cigarettes. These can delay incision healing after surgery. If you need help quitting, ask your health care provider. Contact a health care provider if: You have nausea or vomiting that does not get better with medicine. You vomit every time you eat or drink. You have pain that does  not get better with medicine. You cannot urinate or have bloody urine. You develop a skin rash. You have a fever. Get help right away if: You have trouble breathing. You have chest pain. You vomit blood. These symptoms may be an emergency. Get help right away. Call 911. Do not wait to see if the symptoms will go away. Do not drive yourself to the hospital. Summary After the procedure, it is common to have a sore throat, hoarseness, nausea, vomiting, or to feel weak, sleepy, or fatigue. For the time period you were told by your health care provider, do not drive or use machinery. Get help right away if you have difficulty breathing, have chest pain, or vomit blood. These symptoms may be an emergency. This information is not intended to replace advice given to you by your health care provider. Make sure you discuss any questions you have with your health care provider. Document Revised: 09/22/2021 Document Reviewed:  09/22/2021 Elsevier Patient Education  2024 [employer].

## 2023-12-26 ENCOUNTER — Encounter (HOSPITAL_COMMUNITY): Payer: Self-pay

## 2023-12-26 ENCOUNTER — Encounter (HOSPITAL_COMMUNITY)
Admission: RE | Admit: 2023-12-26 | Discharge: 2023-12-26 | Disposition: A | Discharge status: Home / Self Care | Source: Ambulatory Visit | Attending: Gastroenterology | Admitting: Gastroenterology

## 2023-12-26 ENCOUNTER — Other Ambulatory Visit: Payer: Self-pay

## 2023-12-26 VITALS — Ht 70.0 in | Wt 144.0 lb

## 2023-12-26 DIAGNOSIS — I10 Essential (primary) hypertension: Secondary | ICD-10-CM | POA: Insufficient documentation

## 2023-12-26 DIAGNOSIS — Z01810 Encounter for preprocedural cardiovascular examination: Secondary | ICD-10-CM | POA: Insufficient documentation

## 2023-12-27 ENCOUNTER — Telehealth: Payer: Self-pay | Admitting: Urology

## 2023-12-27 NOTE — Telephone Encounter (Signed)
 Spoke with pt in office briefly and verified he had no other symptoms other than the smell of his urine pt was advised to increase water  intake pt stated he drinks about 5-7 bottles of water  a day pt was advised again to increase his water  intake and a message would be sent to the provider

## 2023-12-27 NOTE — Telephone Encounter (Signed)
 Urine has bad smell   Dysuria  Patient called with c/o dysuria x 1 week days.  Pain:  no pain Severity:1/10  Associated Signs and Symptoms:  Fever: noTemp. Chills: no Hematuria: no Urgency: no Frequency: no Hesitancy:no Incontinence: no Nausea: no Vomiting: no  Message sent to clinic staff to return call to patient to advise of plan.

## 2023-12-31 ENCOUNTER — Ambulatory Visit (HOSPITAL_COMMUNITY): Payer: Self-pay | Admitting: Anesthesiology

## 2023-12-31 ENCOUNTER — Ambulatory Visit (HOSPITAL_COMMUNITY)
Admission: RE | Admit: 2023-12-31 | Discharge: 2023-12-31 | Disposition: A | Discharge status: Home / Self Care | Source: Ambulatory Visit | Attending: Gastroenterology | Admitting: Gastroenterology

## 2023-12-31 ENCOUNTER — Ambulatory Visit (HOSPITAL_BASED_OUTPATIENT_CLINIC_OR_DEPARTMENT_OTHER): Payer: Self-pay | Admitting: Anesthesiology

## 2023-12-31 ENCOUNTER — Encounter (HOSPITAL_COMMUNITY)
Admission: RE | Disposition: A | Discharge status: Home / Self Care | Payer: Self-pay | Source: Ambulatory Visit | Attending: Gastroenterology

## 2023-12-31 ENCOUNTER — Encounter (HOSPITAL_COMMUNITY): Payer: Self-pay | Admitting: Gastroenterology

## 2023-12-31 DIAGNOSIS — K31A11 Gastric intestinal metaplasia without dysplasia, involving the antrum: Secondary | ICD-10-CM | POA: Diagnosis not present

## 2023-12-31 DIAGNOSIS — K920 Hematemesis: Secondary | ICD-10-CM | POA: Diagnosis present

## 2023-12-31 DIAGNOSIS — Z7984 Long term (current) use of oral hypoglycemic drugs: Secondary | ICD-10-CM | POA: Insufficient documentation

## 2023-12-31 DIAGNOSIS — Z8249 Family history of ischemic heart disease and other diseases of the circulatory system: Secondary | ICD-10-CM | POA: Insufficient documentation

## 2023-12-31 DIAGNOSIS — G4733 Obstructive sleep apnea (adult) (pediatric): Secondary | ICD-10-CM | POA: Insufficient documentation

## 2023-12-31 DIAGNOSIS — K219 Gastro-esophageal reflux disease without esophagitis: Secondary | ICD-10-CM | POA: Diagnosis not present

## 2023-12-31 DIAGNOSIS — B9681 Helicobacter pylori [H. pylori] as the cause of diseases classified elsewhere: Secondary | ICD-10-CM

## 2023-12-31 DIAGNOSIS — K2970 Gastritis, unspecified, without bleeding: Secondary | ICD-10-CM

## 2023-12-31 DIAGNOSIS — I10 Essential (primary) hypertension: Secondary | ICD-10-CM

## 2023-12-31 DIAGNOSIS — E119 Type 2 diabetes mellitus without complications: Secondary | ICD-10-CM

## 2023-12-31 DIAGNOSIS — K2289 Other specified disease of esophagus: Secondary | ICD-10-CM | POA: Diagnosis not present

## 2023-12-31 DIAGNOSIS — Z833 Family history of diabetes mellitus: Secondary | ICD-10-CM | POA: Diagnosis not present

## 2023-12-31 HISTORY — PX: ESOPHAGOGASTRODUODENOSCOPY: SHX5428

## 2023-12-31 LAB — GLUCOSE, CAPILLARY: Glucose-Capillary: 151 mg/dL — ABNORMAL HIGH (ref 70–99)

## 2023-12-31 SURGERY — EGD (ESOPHAGOGASTRODUODENOSCOPY)
Anesthesia: General

## 2023-12-31 MED ORDER — STERILE WATER FOR IRRIGATION IR SOLN
Status: DC | PRN
  Administered 2023-12-31: 50 mL

## 2023-12-31 MED ORDER — LACTATED RINGERS IV SOLN: INTRAVENOUS | Status: DC

## 2023-12-31 MED ORDER — PROPOFOL 10 MG/ML IV BOLUS
INTRAVENOUS | Status: DC | PRN
  Administered 2023-12-31 (×2): 50 mg via INTRAVENOUS

## 2023-12-31 MED ORDER — LIDOCAINE 2% (20 MG/ML) 5 ML SYRINGE
INTRAMUSCULAR | Status: DC | PRN
  Administered 2023-12-31: 60 mg via INTRAVENOUS

## 2023-12-31 NOTE — Discharge Instructions (Signed)
You are being discharged to home.  ?Resume your previous diet.  ?We are waiting for your pathology results.  ?Continue your present medications.  ?

## 2023-12-31 NOTE — Transfer of Care (Signed)
 Immediate Anesthesia Transfer of Care Note  Patient: [name]  Procedure(s) Performed: EGD (ESOPHAGOGASTRODUODENOSCOPY)  Patient Location: Endoscopy Unit  Anesthesia Type:General  Level of Consciousness: awake, alert , oriented, and patient cooperative  Airway & Oxygen  Therapy: Patient Spontanous Breathing  Post-op Assessment: Report given to RN, Post -op Vital signs reviewed and stable, and Patient moving all extremities X 4  Post vital signs: Reviewed and stable  Last Vitals:  Vitals Value Taken Time  BP 112/81 12/31/23 08:16  Temp 36.8 C 12/31/23 08:16  Pulse 63 12/31/23 08:16  Resp 19 12/31/23 08:16  SpO2 100 % 12/31/23 08:16    Last Pain:  Vitals:   12/31/23 0816  TempSrc: Oral  PainSc: 0-No pain         Complications: No notable events documented.

## 2023-12-31 NOTE — Op Note (Signed)
 [facility] Patient Name: Jolly Bleicher Procedure Date: 12/31/2023 7:49 AM MRN: [id number] Date of Birth: 1941-01-14 Attending MD: Toribio Fortune , , [id number] CSN: [id number] Age: 83 Admit Type: Outpatient Procedure:                Upper GI endoscopy Indications:              Hematemesis Providers:                Toribio Fortune, Olam Ada, RN, Jon Loge Referring MD:              Medicines:                Monitored Anesthesia Care Complications:            No immediate complications. Estimated Blood Loss:     Estimated blood loss: none. Procedure:                Pre-Anesthesia Assessment:                           - Prior to the procedure, a History and Physical                            was performed, and patient medications, allergies                            and sensitivities were reviewed. The patient's                            tolerance of previous anesthesia was reviewed.                           - The risks and benefits of the procedure and the                            sedation options and risks were discussed with the                            patient. All questions were answered and informed                            consent was obtained.                           - ASA Grade Assessment: III - A patient with severe                            systemic disease.                           After obtaining informed consent, the endoscope was                            passed under direct vision. Throughout the                            procedure, the patient's blood pressure, pulse,  and                            oxygen  saturations were monitored continuously. The                            [id number]-[id number] ([id number]) scope was introduced through the                            mouth, and advanced to the second part of duodenum.                            The upper GI endoscopy was accomplished without                            difficulty. The patient  tolerated the procedure                            well. Scope In: 8:04:52 AM Scope Out: 8:11:50 AM Total Procedure Duration: 0 hours 6 minutes 58 seconds  Findings:      The examined esophagus was normal.      The Z-line was irregular and was found 40 cm from the incisors.      Patchy mild inflammation characterized by erosions and erythema was       found in the gastric antrum. Biopsies were taken with a cold forceps for       Helicobacter pylori testing.      The examined duodenum was normal. Impression:               - Normal esophagus.                           - Z-line irregular, 40 cm from the incisors.                           - Gastritis. Biopsied.                           - Normal examined duodenum. Moderate Sedation:      Per Anesthesia Care Recommendation:           - Discharge patient to home (ambulatory).                           - Resume previous diet.                           - Await pathology results.                           - Continue present medications. Procedure Code(s):        --- Professional ---                           [phone], Esophagogastroduodenoscopy, flexible,                            transoral; with biopsy, single or multiple  Diagnosis Code(s):        --- Professional ---                           K22.89, Other specified disease of esophagus                           K29.70, Gastritis, unspecified, without bleeding                           K92.0, Hematemesis CPT copyright 2022 American Medical Association. All rights reserved. The codes documented in this report are preliminary and upon coder review may  be revised to meet current compliance requirements. Toribio Fortune, MD Toribio Fortune,  12/31/2023 8:18:57 AM This report has been signed electronically. Number of Addenda: 0

## 2023-12-31 NOTE — Anesthesia Preprocedure Evaluation (Signed)
 Anesthesia Evaluation  Patient identified by MRN, date of birth, ID band Patient awake    Reviewed: Allergy & Precautions, H&P , NPO status , Patient's Chart, lab work & pertinent test results, reviewed documented beta blocker date and time   Airway Mallampati: II  TM Distance: >3 FB Neck ROM: full    Dental no notable dental hx.    Pulmonary sleep apnea , pneumonia   Pulmonary exam normal breath sounds clear to auscultation       Cardiovascular Exercise Tolerance: Good hypertension,  Rhythm:regular Rate:Normal     Neuro/Psych negative neurological ROS  negative psych ROS   GI/Hepatic Neg liver ROS,GERD  ,,  Endo/Other  diabetes    Renal/GU negative Renal ROS  negative genitourinary   Musculoskeletal   Abdominal   Peds  Hematology negative hematology ROS (+)   Anesthesia Other Findings   Reproductive/Obstetrics negative OB ROS                             Anesthesia Physical Anesthesia Plan  ASA: 3  Anesthesia Plan: General   Post-op Pain Management:    Induction:   PONV Risk Score and Plan: Propofol  infusion  Airway Management Planned:   Additional Equipment:   Intra-op Plan:   Post-operative Plan:   Informed Consent: I have reviewed the patients History and Physical, chart, labs and discussed the procedure including the risks, benefits and alternatives for the proposed anesthesia with the patient or authorized representative who has indicated his/her understanding and acceptance.     Dental Advisory Given  Plan Discussed with: CRNA  Anesthesia Plan Comments:        Anesthesia Quick Evaluation

## 2023-12-31 NOTE — H&P (Signed)
 [name] is an 83 y.o. male.   Chief Complaint: hematemesis HPI: [name] is a 83 y.o. male with past medical history of  GERD, diabetes, Schatzki's ring, hypertension, opioid-induced constipation, esophageal dysmotility, hyperlipidemia and OSA, coming for evaluation of hematemesis.  Patient reports having some chronic upper abdominal pain. Denies more episodes of hematemesis. The patient denies having any nausea, vomiting, fever, chills, hematochezia, melena,  abdominal distention, diarrhea, jaundice, pruritus or weight loss.   Past Medical History:  Diagnosis Date   Arthritis    fingers, back   Chronic back pain    Diabetes mellitus Since 1995   Takes Glucovance  and Onglyza daily   Diabetes mellitus without complication (HCC)    GERD (gastroesophageal reflux disease)    takes Dexilant and Omeprazole  daily   Herpes    History of colon polyps    History of gastric ulcer 25+yrs ago   Hyperlipidemia Since 1995   takes Simvastatin  daily   Hypertension Since 1995   takes Metoprolol  and Ramipril  daily   Hypertension    Pneumonia    hx of;as a child   Sleep apnea    doesn't use CPAP   Urinary frequency    Urinary urgency    takes Flomax  daily    Past Surgical History:  Procedure Laterality Date   BACK SURGERY  1962   Lumbar   BACK SURGERY     bilateral cataract surgery     BIOPSY  02/23/2022   Procedure: BIOPSY;  Surgeon: Eartha Angelia Sieving, MD;  Location: [facility];  Service: Gastroenterology;;   BIOPSY  01/01/2023   Procedure: BIOPSY;  Surgeon: Eartha Angelia Sieving, MD;  Location: [facility];  Service: Gastroenterology;;   CARDIAC CATHETERIZATION  02/16/10   CERVICAL SPINE SURGERY     COLONOSCOPY     COLONOSCOPY N/A 04/23/2013   Procedure: COLONOSCOPY;  Surgeon: Claudis RAYMOND Rivet, MD;  Location: [facility];  Service: Endoscopy;  Laterality: N/A;  1030   COLONOSCOPY N/A 07/24/2018   Procedure: COLONOSCOPY;  Surgeon: Rivet Claudis RAYMOND,  MD;  Location: [facility];  Service: Endoscopy;  Laterality: N/A;  8:30   COLONOSCOPY WITH PROPOFOL  N/A 11/28/2021   Procedure: COLONOSCOPY WITH PROPOFOL ;  Surgeon: Eartha Angelia Sieving, MD;  Location: [facility];  Service: Gastroenterology;  Laterality: N/A;  205   ESOPHAGEAL DILATION N/A 02/10/2020   Procedure: ESOPHAGEAL DILATION;  Surgeon: Rivet Claudis RAYMOND, MD;  Location: [facility];  Service: Endoscopy;  Laterality: N/A;   ESOPHAGOGASTRODUODENOSCOPY N/A 12/24/2014   Procedure: ESOPHAGOGASTRODUODENOSCOPY (EGD);  Surgeon: Claudis RAYMOND Rivet, MD;  Location: [facility];  Service: Endoscopy;  Laterality: N/A;  2:45   ESOPHAGOGASTRODUODENOSCOPY (EGD) WITH ESOPHAGEAL DILATION     ESOPHAGOGASTRODUODENOSCOPY (EGD) WITH PROPOFOL  N/A 02/10/2020   Procedure: ESOPHAGOGASTRODUODENOSCOPY (EGD) WITH PROPOFOL ;  Surgeon: Rivet Claudis RAYMOND, MD;  Location: [facility];  Service: Endoscopy;  Laterality: N/A;  250   ESOPHAGOGASTRODUODENOSCOPY (EGD) WITH PROPOFOL  N/A 02/23/2022   Procedure: ESOPHAGOGASTRODUODENOSCOPY (EGD) WITH PROPOFOL ;  Surgeon: Eartha Angelia Sieving, MD;  Location: [facility];  Service: Gastroenterology;  Laterality: N/A;  1030   ESOPHAGOGASTRODUODENOSCOPY (EGD) WITH PROPOFOL  N/A 01/01/2023   Procedure: ESOPHAGOGASTRODUODENOSCOPY (EGD) WITH PROPOFOL ;  Surgeon: Eartha Angelia Sieving, MD;  Location: [facility];  Service: Gastroenterology;  Laterality: N/A;  9:00AM;asa 3   HAND SURGERY     Right   LUMBAR LAMINECTOMY/DECOMPRESSION MICRODISCECTOMY N/A 08/29/2012   Procedure: LUMBAR LAMINECTOMY/DECOMPRESSION MICRODISCECTOMY 1 LEVEL;  Surgeon: Catalina CHRISTELLA Stains, MD;  Location: [facility];  Service: Neurosurgery;  Laterality: N/A;  Lumbar four-five laminectomies   NECK SURGERY     PENILE PROSTHESIS IMPLANT     PENILE PROSTHESIS IMPLANT N/A 08/07/2021   Procedure: PENILE PROTHESIS INFLATABLE;  Surgeon: Sherrilee Belvie CROME, MD;  Location: [facility];  Service: Urology;  Laterality: N/A;    POLYPECTOMY  11/28/2021   Procedure: POLYPECTOMY;  Surgeon: Eartha Angelia Sieving, MD;  Location: [facility];  Service: Gastroenterology;;   REMOVAL OF PENILE PROSTHESIS N/A 08/07/2021   Procedure: REMOVAL OF PENILE PROSTHESIS;  Surgeon: Sherrilee Belvie CROME, MD;  Location: [facility];  Service: Urology;  Laterality: N/A;   ROTATOR CUFF REPAIR     Right   SHOULDER SURGERY     TRANSURETHRAL RESECTION OF PROSTATE N/A 05/25/2019   Procedure: TRANSURETHRAL RESECTION OF THE PROSTATE (TURP);  Surgeon: Sherrilee Belvie CROME, MD;  Location: [facility];  Service: Urology;  Laterality: N/A;   WRIST SURGERY     Left   WRIST SURGERY      Family History  Problem Relation Age of Onset   Heart attack Father 64       MI   Hypertension Father    Heart attack Brother 10       MI   Diabetes Mother    Heart attack Paternal Uncle    Social History:  reports that he has never smoked. He has never used smokeless tobacco. He reports that he does not currently use alcohol. He reports current drug use. Drug: Marijuana.  Allergies:  Allergies  Allergen Reactions   Aspirin  Other (See Comments)    Higher dosage upsets stomach   Bayer Aspirin  [Aspirin ] Other (See Comments)    Stomach flares up    Medications Prior to Admission  Medication Sig Dispense Refill   famotidine  (PEPCID ) 40 MG tablet Take 40 mg by mouth daily.     gabapentin  (NEURONTIN ) 300 MG capsule Take 300 mg by mouth 2 (two) times daily.     pantoprazole  (PROTONIX ) 40 MG tablet Take 1 tablet (40 mg total) by mouth daily. 30 tablet 0   sucralfate  (CARAFATE ) 1 g tablet Take 1 tablet (1 g total) by mouth 4 (four) times daily. 120 tablet 0   acyclovir (ZOVIRAX) 200 MG capsule Take 200 mg by mouth as needed.     acyclovir (ZOVIRAX) 800 MG tablet Take 800 mg by mouth 2 (two) times daily.     albuterol  (VENTOLIN  HFA) 108 (90 Base) MCG/ACT inhaler SMARTSIG:2 Puff(s) Via Inhaler 4 Times Daily PRN     alfuzosin  (UROXATRAL ) 10 MG 24 hr tablet Take 1  tablet (10 mg total) by mouth at bedtime. 30 tablet 11   amLODipine  (NORVASC ) 10 MG tablet Take 10 mg by mouth daily.     aspirin  EC 81 MG tablet Take 81 mg by mouth daily. Swallow whole.     Blood Glucose Monitoring Suppl (ONETOUCH VERIO FLEX SYSTEM) w/Device KIT USE TO TEST TWICE DAILY     buprenorphine (BUTRANS) 15 MCG/HR Place onto the skin once a week.     cetirizine (ZYRTEC) 10 MG tablet Take 10 mg by mouth daily.     diclofenac  Sodium (VOLTAREN ) 1 % GEL Apply 4 g topically 4 (four) times daily. 150 g 0   dicyclomine  (BENTYL ) 10 MG capsule Take 1 capsule (10 mg total) by mouth every 12 (twelve) hours as needed (abdominal pain). 60 capsule 2   glyBURIDE -metformin  (GLUCOVANCE ) 5-500 MG per tablet Take 2 tablets by mouth 2 (two)  times daily with a meal.     hydrochlorothiazide (HYDRODIURIL) 12.5 MG tablet Take 12.5 mg by mouth daily.     JARDIANCE 25 MG TABS tablet Take 25 mg by mouth daily.     labetalol  (NORMODYNE ) 200 MG tablet Take 200 mg by mouth daily at 6 (six) AM.     Lancets (ONETOUCH DELICA PLUS LANCET33G) MISC USE TO TEST TWICE DAILY     loratadine (CLARITIN) 10 MG tablet Take 10 mg by mouth daily.     magic mouthwash (nystatin , lidocaine , diphenhydrAMINE , alum & mag hydroxide) suspension Swish and spit 5 mLs 3 (three) times daily as needed for mouth pain. 210 mL 0   metFORMIN  (GLUCOPHAGE ) 500 MG tablet Take 500 mg by mouth 2 (two) times daily.     metoprolol  succinate (TOPROL -XL) 50 MG 24 hr tablet Take 50 mg by mouth daily. Take with or immediately following a meal.     nystatin  (MYCOSTATIN ) 100000 UNIT/ML suspension Use as directed 5 mLs (500,000 Units total) in the mouth or throat 4 (four) times daily. Swish and swallow (Patient not taking: Reported on 05/14/2023) 240 mL 0   ONETOUCH VERIO test strip USE TO TEST TWICE DAILY     oxyCODONE -acetaminophen  (PERCOCET) 10-325 MG tablet Take 1 tablet by mouth 2 (two) times daily as needed.     pantoprazole  (PROTONIX ) 40 MG tablet Take 1  tablet (40 mg total) by mouth 2 (two) times daily. 180 tablet 1   polyethylene glycol powder (GLYCOLAX ) 17 GM/SCOOP powder Take 17 g by mouth daily. 850 g 3   ramipril  (ALTACE ) 10 MG capsule Take 10 mg by mouth daily.      simvastatin  (ZOCOR ) 20 MG tablet Take 20 mg by mouth daily.      Results for orders placed or performed during the hospital encounter of 12/31/23 (from the past 48 hours)  Glucose, capillary     Status: Abnormal   Collection Time: 12/31/23  6:50 AM  Result Value Ref Range   Glucose-Capillary 151 (H) 70 - 99 mg/dL    Comment: Glucose reference range applies only to samples taken after fasting for at least 8 hours.   No results found.  Review of Systems  All other systems reviewed and are negative.   Blood pressure (!) 148/73, pulse 61, temperature 97.8 F (36.6 C), temperature source Oral, resp. rate 19, height 5' 10 (1.778 m), weight 65.1 kg, SpO2 100%. Physical Exam  GENERAL: The patient is AO x3, in no acute distress. HEENT: Head is normocephalic and atraumatic. EOMI are intact. Mouth is well hydrated and without lesions. NECK: Supple. No masses LUNGS: Clear to auscultation. No presence of rhonchi/wheezing/rales. Adequate chest expansion HEART: RRR, normal s1 and s2. ABDOMEN: tender in upper abdomen, no guarding, no peritoneal signs, and nondistended. BS +. No masses. EXTREMITIES: Without any cyanosis, clubbing, rash, lesions or edema. NEUROLOGIC: AOx3, no focal motor deficit. SKIN: no jaundice, no rashes  Assessment/Plan YONI LOBOS is a 83 y.o. male with past medical history of  GERD, diabetes, Schatzki's ring, hypertension, opioid-induced constipation, esophageal dysmotility, hyperlipidemia and OSA, coming for evaluation of hematemesis. We will proceed with EGD.  Toribio Eartha Flavors, MD 12/31/2023, 7:25 AM

## 2024-01-01 ENCOUNTER — Encounter (HOSPITAL_COMMUNITY): Payer: Self-pay | Admitting: Gastroenterology

## 2024-01-01 LAB — SURGICAL PATHOLOGY

## 2024-01-01 NOTE — Anesthesia Postprocedure Evaluation (Signed)
 Anesthesia Post Note  Patient: [name]  Procedure(s) Performed: EGD (ESOPHAGOGASTRODUODENOSCOPY)  Patient location during evaluation: Phase II Anesthesia Type: General Level of consciousness: awake Pain management: pain level controlled Vital Signs Assessment: post-procedure vital signs reviewed and stable Respiratory status: spontaneous breathing and respiratory function stable Cardiovascular status: blood pressure returned to baseline and stable Postop Assessment: no headache and no apparent nausea or vomiting Anesthetic complications: no Comments: Late entry   No notable events documented.   Last Vitals:  Vitals:   12/31/23 0705 12/31/23 0816  BP: (!) 148/73 112/81  Pulse: 61 63  Resp: 19 19  Temp: 36.6 C 36.8 C  SpO2: 100% 100%    Last Pain:  Vitals:   12/31/23 0816  TempSrc: Oral  PainSc: 0-No pain                 Yvonna JINNY Bosworth

## 2024-01-03 ENCOUNTER — Other Ambulatory Visit (INDEPENDENT_AMBULATORY_CARE_PROVIDER_SITE_OTHER): Payer: Self-pay | Admitting: Gastroenterology

## 2024-01-03 ENCOUNTER — Ambulatory Visit (INDEPENDENT_AMBULATORY_CARE_PROVIDER_SITE_OTHER): Payer: Self-pay | Admitting: Gastroenterology

## 2024-01-03 DIAGNOSIS — B9681 Helicobacter pylori [H. pylori] as the cause of diseases classified elsewhere: Secondary | ICD-10-CM

## 2024-01-03 MED ORDER — RIFABUTIN 150 MG PO CAPS: 150.0000 mg | ORAL_CAPSULE | Freq: Two times a day (BID) | ORAL | 0 refills | Status: AC

## 2024-01-03 MED ORDER — AMOXICILLIN 500 MG PO CAPS: 1000.0000 mg | ORAL_CAPSULE | Freq: Three times a day (TID) | ORAL | 0 refills | Status: AC

## 2024-01-07 NOTE — Progress Notes (Signed)
 3 yr EGD noted in recall

## 2024-01-15 ENCOUNTER — Other Ambulatory Visit (INDEPENDENT_AMBULATORY_CARE_PROVIDER_SITE_OTHER): Payer: Self-pay | Admitting: Gastroenterology

## 2024-01-17 ENCOUNTER — Telehealth (INDEPENDENT_AMBULATORY_CARE_PROVIDER_SITE_OTHER): Payer: Self-pay | Admitting: Gastroenterology

## 2024-01-17 NOTE — Telephone Encounter (Signed)
 Pt came in office and had all medications with him. Pt wanted to know if he was taking medication he wasn't supposed to be. Went through meds with pt. Pt states he has stopped the Omeprazole  since before EGD. Pt states he is taking his antibiotic prescribed for H pylori. Advised pt to make sure he is eating enough with the antibiotics and to try yogurt to help with nausea and to make sure good bacteria is replaced since antibiotics take away good bacteria also. Informed pt to make sure he comes to 01/28/24 appt with all medications and at that time we will let him know what he needs to take and what he doesn't need to take. Advised pt to continue with current regimen until next office visit. Pt verbalized understanding.

## 2024-01-17 NOTE — Telephone Encounter (Signed)
 I spoke with the patient today.  He is not taking omeprazole  but he is taking pantoprazole  twice a day.  He has not finished his antibiotic course with rifabutin .  He will finish this course (2 weeks).

## 2024-01-20 NOTE — Telephone Encounter (Signed)
 Noted

## 2024-01-28 ENCOUNTER — Encounter (INDEPENDENT_AMBULATORY_CARE_PROVIDER_SITE_OTHER): Payer: Self-pay | Admitting: Gastroenterology

## 2024-01-28 ENCOUNTER — Ambulatory Visit (INDEPENDENT_AMBULATORY_CARE_PROVIDER_SITE_OTHER): Admitting: Gastroenterology

## 2024-01-28 VITALS — BP 152/68 | HR 72 | Temp 97.8°F | Ht 70.0 in | Wt 141.5 lb

## 2024-01-28 DIAGNOSIS — K2950 Unspecified chronic gastritis without bleeding: Secondary | ICD-10-CM | POA: Diagnosis not present

## 2024-01-28 DIAGNOSIS — B9681 Helicobacter pylori [H. pylori] as the cause of diseases classified elsewhere: Secondary | ICD-10-CM | POA: Diagnosis not present

## 2024-01-28 NOTE — Patient Instructions (Addendum)
 Please complete current H pylori therapy( rifabutin , amoxicillin , protonix ) 2 weeks after you complete these, please stop your protonix  for 14 days, after 14 days you need to go to the lab for the h pylori breath testing to ensure the infection is cleared up. After this, you can restart your protonix    Follow up 6 months

## 2024-01-28 NOTE — Progress Notes (Signed)
 Referring Provider: Fanta, Tesfaye Demissie* Primary Care Physician:  Fanta, Tesfaye Demissie, MD Primary GI Physician: Dr. Eartha   Chief Complaint  Patient presents with   Follow-up    Pt arrives for follow up. Pt states he is feeling better. Pt is almost finished with antibiotics (will be finished Friday). Pt is not taking Omeprazole .    HPI:   [name] is a 83 y.o. male with past medical history of  GERD, diabetes, Schatzki's ring, hypertension, opioid-induced constipation, esophageal dysmotility, hyperlipidemia and OSA   Patient presenting today for:  Follow up Chronic H pylori gastritis   Last seen may, at that time had one episode of hematemesis, bad taste in his mouth, some sensation of something stuck in his epigastric area. No real dysphagia, other than with steak  Recommended continue H pylori treatment to completion, continue protonix  BID, schedule EGD, Hold PPI 2 weeks prior to EGD  EGD on 6/24 - Normal esophagus.                           - Z-line irregular, 40 cm from the incisors.                           - Gastritis. Biopsied.                           - Normal examined duodenum.  A. STOMACH BIOPSY:  - Gastric antral and oxyntic mucosa with Helicobacter pylori-associated  gastritis  - Focal intestinal metaplasia of antral mucosa, negative for dysplasia  - Helicobacter pylori-like organisms were identified on routine HE  stain   Patient sent rifabutin  triple regimen, recommended to repeat EGD in 3 years.  Present: Doing well. States he finishes his H pylori therapy on Friday. Abdominal pain is much better. No GERD symptoms. Denies any dysphagia or odynophagia. Denies nausea, vomiting. No further episodes of hematemesis. No red flag symptoms. Patient denies melena, hematochezia, nausea, vomiting, diarrhea, constipation, dysphagia, odyonophagia, early satiety or weight loss.   Last Colonoscopy:11/28/2021 The perianal and digital rectal examinations  were normal. Six sessile polyps were found in the ascending colon. The polyps were 2 to 6 mm in size. These polyps were removed with a cold snare. Resection and retrieval were complete for 3 polyps, but the other 3 could not be retrieved (fully resected). The ascending colon and cecum appeared normal othwerwise. CT scan findings likely related to stool. Non-bleeding internal hemorrhoids were found during retroflexion. The hemorrhoids were small. Pathology consistent with tubular adenomas.   Not Recommended to have a repeat colonoscopy given patient comorbiditie  Past Medical History:  Diagnosis Date   Arthritis    fingers, back   Chronic back pain    Diabetes mellitus Since 1995   Takes Glucovance  and Onglyza daily   Diabetes mellitus without complication (HCC)    GERD (gastroesophageal reflux disease)    takes Dexilant and Omeprazole  daily   Herpes    History of colon polyps    History of gastric ulcer 25+yrs ago   Hyperlipidemia Since 1995   takes Simvastatin  daily   Hypertension Since 1995   takes Metoprolol  and Ramipril  daily   Hypertension    Pneumonia    hx of;as a child   Sleep apnea    doesn't use CPAP   Urinary frequency    Urinary urgency    takes Flomax  daily  Past Surgical History:  Procedure Laterality Date   BACK SURGERY  1962   Lumbar   BACK SURGERY     bilateral cataract surgery     BIOPSY  02/23/2022   Procedure: BIOPSY;  Surgeon: Eartha Angelia Sieving, MD;  [facility]: [facility];  Service: Gastroenterology;;   BIOPSY  01/01/2023   Procedure: BIOPSY;  Surgeon: Eartha Angelia Sieving, MD;  [facility]: [facility];  Service: Gastroenterology;;   CARDIAC CATHETERIZATION  02/16/10   CERVICAL SPINE SURGERY     COLONOSCOPY     COLONOSCOPY N/A 04/23/2013   Procedure: COLONOSCOPY;  Surgeon: Claudis RAYMOND Rivet, MD;  [facility]: [facility];  Service: Endoscopy;  Laterality: N/A;  1030   COLONOSCOPY N/A 07/24/2018   Procedure: COLONOSCOPY;  Surgeon:  Rivet Claudis RAYMOND, MD;  [facility]: [facility];  Service: Endoscopy;  Laterality: N/A;  8:30   COLONOSCOPY WITH PROPOFOL  N/A 11/28/2021   Procedure: COLONOSCOPY WITH PROPOFOL ;  Surgeon: Eartha Angelia Sieving, MD;  [facility];  Service: Gastroenterology;  Laterality: N/A;  205   ESOPHAGEAL DILATION N/A 02/10/2020   Procedure: ESOPHAGEAL DILATION;  Surgeon: Rivet Claudis RAYMOND, MD;  [facility]: [facility];  Service: Endoscopy;  Laterality: N/A;   ESOPHAGOGASTRODUODENOSCOPY N/A 12/24/2014   Procedure: ESOPHAGOGASTRODUODENOSCOPY (EGD);  Surgeon: Claudis RAYMOND Rivet, MD;  [facility]: [facility];  Service: Endoscopy;  Laterality: N/A;  2:45   ESOPHAGOGASTRODUODENOSCOPY N/A 12/31/2023   Procedure: EGD (ESOPHAGOGASTRODUODENOSCOPY);  Surgeon: Eartha Angelia, Sieving, MD;  [facility]: [facility];  Service: Gastroenterology;  Laterality: N/A;  8:00 am, asa 3   ESOPHAGOGASTRODUODENOSCOPY (EGD) WITH ESOPHAGEAL DILATION     ESOPHAGOGASTRODUODENOSCOPY (EGD) WITH PROPOFOL  N/A 02/10/2020   Procedure: ESOPHAGOGASTRODUODENOSCOPY (EGD) WITH PROPOFOL ;  Surgeon: Rivet Claudis RAYMOND, MD;  [facility];  Service: Endoscopy;  Laterality: N/A;  250   ESOPHAGOGASTRODUODENOSCOPY (EGD) WITH PROPOFOL  N/A 02/23/2022   Procedure: ESOPHAGOGASTRODUODENOSCOPY (EGD) WITH PROPOFOL ;  Surgeon: Eartha Angelia Sieving, MD;  [facility];  Service: Gastroenterology;  Laterality: N/A;  1030   ESOPHAGOGASTRODUODENOSCOPY (EGD) WITH PROPOFOL  N/A 01/01/2023   Procedure: ESOPHAGOGASTRODUODENOSCOPY (EGD) WITH PROPOFOL ;  Surgeon: Eartha Angelia Sieving, MD;  [facility];  Service: Gastroenterology;  Laterality: N/A;  9:00AM;asa 3   HAND SURGERY     Right   LUMBAR LAMINECTOMY/DECOMPRESSION MICRODISCECTOMY N/A 08/29/2012   Procedure: LUMBAR LAMINECTOMY/DECOMPRESSION MICRODISCECTOMY 1 LEVEL;  Surgeon: Catalina CHRISTELLA Stains, MD;  Location: [facility];  Service: Neurosurgery;  Laterality: N/A;  Lumbar four-five  laminectomies   NECK SURGERY     PENILE PROSTHESIS IMPLANT     PENILE PROSTHESIS IMPLANT N/A 08/07/2021   Procedure: PENILE PROTHESIS INFLATABLE;  Surgeon: Sherrilee Belvie CROME, MD;  Location: [facility];  Service: Urology;  Laterality: N/A;   POLYPECTOMY  11/28/2021   Procedure: POLYPECTOMY;  Surgeon: Eartha Angelia Sieving, MD;  [facility];  Service: Gastroenterology;;   REMOVAL OF PENILE PROSTHESIS N/A 08/07/2021   Procedure: REMOVAL OF PENILE PROSTHESIS;  Surgeon: Sherrilee Belvie CROME, MD;  Location: [facility];  Service: Urology;  Laterality: N/A;   ROTATOR CUFF REPAIR     Right   SHOULDER SURGERY     TRANSURETHRAL RESECTION OF PROSTATE N/A 05/25/2019   Procedure: TRANSURETHRAL RESECTION OF THE PROSTATE (TURP);  Surgeon: Sherrilee Belvie CROME, MD;  Location: [facility];  Service: Urology;  Laterality: N/A;   WRIST SURGERY     Left   WRIST SURGERY      Current Outpatient Medications  Medication Sig Dispense Refill  acyclovir (ZOVIRAX) 200 MG capsule Take 200 mg by mouth as needed.     acyclovir (ZOVIRAX) 800 MG tablet Take 800 mg by mouth 2 (two) times daily.     albuterol  (VENTOLIN  HFA) 108 (90 Base) MCG/ACT inhaler SMARTSIG:2 Puff(s) Via Inhaler 4 Times Daily PRN     alfuzosin  (UROXATRAL ) 10 MG 24 hr tablet Take 1 tablet (10 mg total) by mouth at bedtime. 30 tablet 11   amLODipine  (NORVASC ) 10 MG tablet Take 10 mg by mouth daily.     aspirin  EC 81 MG tablet Take 81 mg by mouth daily. Swallow whole.     Blood Glucose Monitoring Suppl (ONETOUCH VERIO FLEX SYSTEM) w/Device KIT USE TO TEST TWICE DAILY     buprenorphine (BUTRANS) 15 MCG/HR Place onto the skin once a week.     cetirizine (ZYRTEC) 10 MG tablet Take 10 mg by mouth daily.     diclofenac  Sodium (VOLTAREN ) 1 % GEL Apply 4 g topically 4 (four) times daily. 150 g 0   dicyclomine  (BENTYL ) 10 MG capsule Take 1 capsule (10 mg total) by mouth every 12 (twelve) hours as needed (abdominal pain). 60 capsule 2   famotidine  (PEPCID ) 40  MG tablet Take 40 mg by mouth daily.     gabapentin  (NEURONTIN ) 300 MG capsule Take 300 mg by mouth 2 (two) times daily.     glyBURIDE -metformin  (GLUCOVANCE ) 5-500 MG per tablet Take 2 tablets by mouth 2 (two) times daily with a meal.     hydrochlorothiazide (HYDRODIURIL) 12.5 MG tablet Take 12.5 mg by mouth daily.     JARDIANCE 25 MG TABS tablet Take 25 mg by mouth daily.     labetalol  (NORMODYNE ) 200 MG tablet Take 200 mg by mouth daily at 6 (six) AM.     Lancets (ONETOUCH DELICA PLUS LANCET33G) MISC USE TO TEST TWICE DAILY     loratadine (CLARITIN) 10 MG tablet Take 10 mg by mouth daily.     magic mouthwash (nystatin , lidocaine , diphenhydrAMINE , alum & mag hydroxide) suspension Swish and spit 5 mLs 3 (three) times daily as needed for mouth pain. 210 mL 0   metFORMIN  (GLUCOPHAGE ) 500 MG tablet Take 500 mg by mouth 2 (two) times daily.     metoprolol  succinate (TOPROL -XL) 50 MG 24 hr tablet Take 50 mg by mouth daily. Take with or immediately following a meal.     nystatin  (MYCOSTATIN ) 100000 UNIT/ML suspension Use as directed 5 mLs (500,000 Units total) in the mouth or throat 4 (four) times daily. Swish and swallow 240 mL 0   ONETOUCH VERIO test strip USE TO TEST TWICE DAILY     oxyCODONE -acetaminophen  (PERCOCET) 10-325 MG tablet Take 1 tablet by mouth 2 (two) times daily as needed.     pantoprazole  (PROTONIX ) 40 MG tablet Take 1 tablet (40 mg total) by mouth daily. 30 tablet 0   pantoprazole  (PROTONIX ) 40 MG tablet Take 1 tablet (40 mg total) by mouth 2 (two) times daily. 180 tablet 1   polyethylene glycol powder (GLYCOLAX ) 17 GM/SCOOP powder Take 17 g by mouth daily. 850 g 3   ramipril  (ALTACE ) 10 MG capsule Take 10 mg by mouth daily.      simvastatin  (ZOCOR ) 20 MG tablet Take 20 mg by mouth daily.     sucralfate  (CARAFATE ) 1 g tablet TAKE 1 TABLET(1 GRAM) BY MOUTH FOUR TIMES DAILY 120 tablet 0   No current facility-administered medications for this visit.    Allergies as of 01/28/2024 -  Review Complete 01/28/2024  Allergen Reaction Noted   Aspirin  Other (See Comments)    Bayer aspirin  Bailee.Bloomer ] Other (See Comments) 06/20/2013    Social History   Socioeconomic History   Marital status: Widowed    Spouse name: Not on file   Number of children: 8   Years of education: Not on file   Highest education level: Not on file  Occupational History   Occupation: Human resources officer company    Employer: RETIRED    Comment: Retired  Tobacco Use   Smoking status: Never   Smokeless tobacco: Never  Vaping Use   Vaping status: Never Used  Substance and Sexual Activity   Alcohol use: Not Currently    Comment: occasionally    Drug use: Yes    Types: Marijuana    Comment: occ   Sexual activity: Never  Other Topics Concern   Not on file  Social History Narrative   ** Merged History Encounter **       Social Drivers of Corporate investment banker Strain: Not on file  Food Insecurity: No Food Insecurity (06/12/2022)   [facility]   Hunger Vital Sign    Within the past 12 months, you worried that your food would run out before you got the money to buy more.: Never true    Within the past 12 months, the food you bought just didn't last and you didn't have money to get more.: Never true  Transportation Needs: Not on file  Physical Activity: Not on file  Stress: Not on file  Social Connections: Unknown (06/05/2022)   [facility]   Social Network    Social Network: Not on file    Review of systems General: negative for malaise, night sweats, fever, chills, weight loss Neck: Negative for lumps, goiter, pain and significant neck swelling Resp: Negative for cough, wheezing, dyspnea at rest CV: Negative for chest pain, leg swelling, palpitations, orthopnea GI: denies melena, hematochezia, nausea, vomiting, diarrhea, constipation, dysphagia, odyonophagia, early satiety or unintentional weight loss.  MSK: Negative for joint pain or swelling, back  pain, and muscle pain. Derm: Negative for itching or rash Psych: Denies depression, anxiety, memory loss, confusion. No homicidal or suicidal ideation.  The remainder of the review of systems is noncontributory.  Physical Exam: There were no vitals taken for this visit. General:   Alert and oriented. No distress noted. Pleasant and cooperative.  Head:  Normocephalic and atraumatic. Eyes:  Conjuctiva clear without scleral icterus. Mouth:  Oral mucosa pink and moist. Good dentition. No lesions. Heart: Normal rate and rhythm, s1 and s2 heart sounds present.  Lungs: Clear lung sounds in all lobes. Respirations equal and unlabored. Abdomen:  +BS, soft, non-tender and non-distended. No rebound or guarding. No HSM or masses noted. Derm: No palmar erythema or jaundice Neurologic:  Alert and  oriented x4 Psych:  Alert and cooperative. Normal mood and affect.  Invalid input(s): 6 MONTHS   ASSESSMENT: [name] is a 83 y.o. male presenting today for follow up of chronic H pylori gastritis   Most recent EGD as above in June with findings of H pylori, patient was started on rifabutin  triple therapy which he is set to complete on Friday. He reports improvement in his abdominal pain, no further hematemesis, no GERD symptoms, overall feeling well from a GI standpoint. He should complete all of his H pylori therapy, 2 weeks after completion he will hold his PPI, and proceed with H pylori breath testing 2 weeks after  that to confirm eradication. I discussed this in depth with the patient who verbalized understanding.    PLAN:  -Complete rifabutin  triple therapy -hold PPI starting 8/8, H pylori breath testing around 8/24 -can resume PPI after H pylori breath testing  All questions were answered, patient verbalized understanding and is in agreement with plan as outlined above.   Follow Up: 3 months   Jozsef Wescoat L. Isaiyah Feldhaus, MSN, APRN, AGNP-C Adult-Gerontology Nurse Practitioner [facility]  I have reviewed the note and agree with the APP's assessment as described in this progress note  We will evaluate if he has eradication of H. pylori after discontinuing PPI.  I am concerned that he may have had treatment failure as he did not take  compliantly his PPI while taking rifabutin  triple regimen.  Toribio Fortune, MD Gastroenterology and Hepatology [facility]

## 2024-02-11 ENCOUNTER — Other Ambulatory Visit (INDEPENDENT_AMBULATORY_CARE_PROVIDER_SITE_OTHER): Payer: Self-pay | Admitting: Gastroenterology

## 2024-02-21 ENCOUNTER — Telehealth (INDEPENDENT_AMBULATORY_CARE_PROVIDER_SITE_OTHER): Payer: Self-pay | Admitting: Gastroenterology

## 2024-02-21 NOTE — Telephone Encounter (Signed)
 Barry Merle, LPN  Barry Merle, LPN H.Pylori Breath Test needs to be done around 03/01/24. Pt was given lab orders and highlighted on AVS.  TCNA  Pt walked into office. Printed lab order out and placed a note to have lab done 03/02/24 since 03/01/24 is a Sunday.

## 2024-03-02 ENCOUNTER — Other Ambulatory Visit (INDEPENDENT_AMBULATORY_CARE_PROVIDER_SITE_OTHER): Payer: Self-pay

## 2024-03-02 DIAGNOSIS — B9681 Helicobacter pylori [H. pylori] as the cause of diseases classified elsewhere: Secondary | ICD-10-CM

## 2024-03-04 ENCOUNTER — Encounter: Payer: Self-pay | Admitting: Urology

## 2024-03-04 ENCOUNTER — Ambulatory Visit (INDEPENDENT_AMBULATORY_CARE_PROVIDER_SITE_OTHER): Payer: Self-pay | Admitting: Gastroenterology

## 2024-03-04 ENCOUNTER — Ambulatory Visit: Payer: Medicare HMO | Admitting: Urology

## 2024-03-04 VITALS — BP 137/70 | HR 65

## 2024-03-04 DIAGNOSIS — R351 Nocturia: Secondary | ICD-10-CM | POA: Diagnosis not present

## 2024-03-04 DIAGNOSIS — N401 Enlarged prostate with lower urinary tract symptoms: Secondary | ICD-10-CM | POA: Diagnosis not present

## 2024-03-04 DIAGNOSIS — N138 Other obstructive and reflux uropathy: Secondary | ICD-10-CM

## 2024-03-04 LAB — H. PYLORI BREATH TEST: H pylori Breath Test: NEGATIVE

## 2024-03-04 LAB — MICROSCOPIC EXAMINATION
Bacteria, UA: NONE SEEN
Epithelial Cells (non renal): NONE SEEN /HPF (ref 0–10)
WBC, UA: NONE SEEN /HPF (ref 0–5)

## 2024-03-04 LAB — URINALYSIS, ROUTINE W REFLEX MICROSCOPIC
Bilirubin, UA: NEGATIVE
Ketones, UA: NEGATIVE
Leukocytes,UA: NEGATIVE
Nitrite, UA: NEGATIVE
Protein,UA: NEGATIVE
Specific Gravity, UA: 1.015 (ref 1.005–1.030)
Urobilinogen, Ur: 0.2 mg/dL (ref 0.2–1.0)
pH, UA: 6 (ref 5.0–7.5)

## 2024-03-04 LAB — BLADDER SCAN AMB NON-IMAGING: Scan Result: 82

## 2024-03-04 MED ORDER — ALFUZOSIN HCL ER 10 MG PO TB24: 10.0000 mg | ORAL_TABLET | Freq: Every day | ORAL | 11 refills | Status: AC

## 2024-03-04 NOTE — Progress Notes (Signed)
 03/04/2024 9:24 AM   [name] July 07, 1941 [id number]  Referring provider: Carlette Benita Area, MD [address] [place],  [place] [address]  Followup BPH   HPI: [name] is a 82yo here for followup for BPH and erectile dysfunction. IPSS 12 QOL 2 on uroxatral  10mg  daily. He has urinary frequency on jardiance. Urine stream strong. No straining to urinate. He uses his IPP and on occasion has issues inflating device   PMH: Past Medical History:  Diagnosis Date   Arthritis    fingers, back   Chronic back pain    Diabetes mellitus Since 1995   Takes Glucovance  and Onglyza daily   Diabetes mellitus without complication (HCC)    GERD (gastroesophageal reflux disease)    takes Dexilant and Omeprazole  daily   Herpes    History of colon polyps    History of gastric ulcer 25+yrs ago   Hyperlipidemia Since 1995   takes Simvastatin  daily   Hypertension Since 1995   takes Metoprolol  and Ramipril  daily   Hypertension    Pneumonia    hx of;as a child   Sleep apnea    doesn't use CPAP   Urinary frequency    Urinary urgency    takes Flomax  daily    Surgical History: Past Surgical History:  Procedure Laterality Date   BACK SURGERY  1962   Lumbar   BACK SURGERY     bilateral cataract surgery     BIOPSY  02/23/2022   Procedure: BIOPSY;  Surgeon: Eartha Angelia Sieving, MD;  [facility]: [facility];  Service: Gastroenterology;;   BIOPSY  01/01/2023   Procedure: BIOPSY;  Surgeon: Eartha Angelia Sieving, MD;  [facility]: [facility];  Service: Gastroenterology;;   CARDIAC CATHETERIZATION  02/16/10   CERVICAL SPINE SURGERY     COLONOSCOPY     COLONOSCOPY N/A 04/23/2013   Procedure: COLONOSCOPY;  Surgeon: Claudis RAYMOND Rivet, MD;  [facility]: [facility];  Service: Endoscopy;  Laterality: N/A;  1030   COLONOSCOPY N/A 07/24/2018   Procedure: COLONOSCOPY;  Surgeon: Rivet Claudis RAYMOND, MD;  [facility]: [facility];  Service: Endoscopy;  Laterality: N/A;  8:30    COLONOSCOPY WITH PROPOFOL  N/A 11/28/2021   Procedure: COLONOSCOPY WITH PROPOFOL ;  Surgeon: Eartha Angelia Sieving, MD;  [facility];  Service: Gastroenterology;  Laterality: N/A;  205   ESOPHAGEAL DILATION N/A 02/10/2020   Procedure: ESOPHAGEAL DILATION;  Surgeon: Rivet Claudis RAYMOND, MD;  [facility];  Service: Endoscopy;  Laterality: N/A;   ESOPHAGOGASTRODUODENOSCOPY N/A 12/24/2014   Procedure: ESOPHAGOGASTRODUODENOSCOPY (EGD);  Surgeon: Claudis RAYMOND Rivet, MD;  [facility];  Service: Endoscopy;  Laterality: N/A;  2:45   ESOPHAGOGASTRODUODENOSCOPY N/A 12/31/2023   Procedure: EGD (ESOPHAGOGASTRODUODENOSCOPY);  Surgeon: Eartha Angelia, Sieving, MD;  [facility];  Service: Gastroenterology;  Laterality: N/A;  8:00 am, asa 3   ESOPHAGOGASTRODUODENOSCOPY (EGD) WITH ESOPHAGEAL DILATION     ESOPHAGOGASTRODUODENOSCOPY (EGD) WITH PROPOFOL  N/A 02/10/2020   Procedure: ESOPHAGOGASTRODUODENOSCOPY (EGD) WITH PROPOFOL ;  Surgeon: Rivet Claudis RAYMOND, MD;  [facility];  Service: Endoscopy;  Laterality: N/A;  250   ESOPHAGOGASTRODUODENOSCOPY (EGD) WITH PROPOFOL  N/A 02/23/2022   Procedure: ESOPHAGOGASTRODUODENOSCOPY (EGD) WITH PROPOFOL ;  Surgeon: Eartha Angelia Sieving, MD;  [facility];  Service: Gastroenterology;  Laterality: N/A;  1030   ESOPHAGOGASTRODUODENOSCOPY (EGD) WITH PROPOFOL  N/A 01/01/2023   Procedure: ESOPHAGOGASTRODUODENOSCOPY (EGD) WITH PROPOFOL ;  Surgeon: Eartha Angelia Sieving, MD;  [facility];  Service: Gastroenterology;  Laterality: N/A;  9:00AM;asa 3   HAND SURGERY     Right   LUMBAR LAMINECTOMY/DECOMPRESSION MICRODISCECTOMY N/A 08/29/2012   Procedure: LUMBAR LAMINECTOMY/DECOMPRESSION MICRODISCECTOMY 1 LEVEL;  Surgeon: Catalina CHRISTELLA Stains, MD;  Location: [facility];  Service: Neurosurgery;  Laterality: N/A;  Lumbar four-five laminectomies   NECK SURGERY     PENILE PROSTHESIS IMPLANT     PENILE PROSTHESIS IMPLANT N/A  08/07/2021   Procedure: PENILE PROTHESIS INFLATABLE;  Surgeon: Sherrilee Belvie CROME, MD;  Location: [facility];  Service: Urology;  Laterality: N/A;   POLYPECTOMY  11/28/2021   Procedure: POLYPECTOMY;  Surgeon: Eartha Angelia Sieving, MD;  [facility];  Service: Gastroenterology;;   REMOVAL OF PENILE PROSTHESIS N/A 08/07/2021   Procedure: REMOVAL OF PENILE PROSTHESIS;  Surgeon: Sherrilee Belvie CROME, MD;  Location: [facility];  Service: Urology;  Laterality: N/A;   ROTATOR CUFF REPAIR     Right   SHOULDER SURGERY     TRANSURETHRAL RESECTION OF PROSTATE N/A 05/25/2019   Procedure: TRANSURETHRAL RESECTION OF THE PROSTATE (TURP);  Surgeon: Sherrilee Belvie CROME, MD;  Location: [facility];  Service: Urology;  Laterality: N/A;   WRIST SURGERY     Left   WRIST SURGERY      Home Medications:  Allergies as of 03/04/2024       Reactions   Aspirin  Other (See Comments)   Higher dosage upsets stomach   Bayer Aspirin  [aspirin ] Other (See Comments)   Stomach flares up        Medication List        Accurate as of March 04, 2024  9:24 AM. If you have any questions, ask your nurse or doctor.          acyclovir 200 MG capsule Commonly known as: ZOVIRAX Take 200 mg by mouth as needed.   acyclovir 800 MG tablet Commonly known as: ZOVIRAX Take 800 mg by mouth 2 (two) times daily.   albuterol  108 (90 Base) MCG/ACT inhaler Commonly known as: VENTOLIN  HFA SMARTSIG:2 Puff(s) Via Inhaler 4 Times Daily PRN   alfuzosin  10 MG 24 hr tablet Commonly known as: UROXATRAL  Take 1 tablet (10 mg total) by mouth at bedtime.   amLODipine  10 MG tablet Commonly known as: NORVASC  Take 10 mg by mouth daily.   aspirin  EC 81 MG tablet Take 81 mg by mouth daily. Swallow whole.   buprenorphine 15 MCG/HR Commonly known as: BUTRANS Place onto the skin once a week.   cetirizine 10 MG tablet Commonly known as: ZYRTEC Take 10 mg by mouth daily.   diclofenac  Sodium 1 % Gel Commonly known as:  Voltaren  Apply 4 g topically 4 (four) times daily.   dicyclomine  10 MG capsule Commonly known as: BENTYL  Take 1 capsule (10 mg total) by mouth every 12 (twelve) hours as needed (abdominal pain).   famotidine  40 MG tablet Commonly known as: PEPCID  Take 40 mg by mouth daily.   gabapentin  300 MG capsule Commonly known as: NEURONTIN  Take 300 mg by mouth 2 (two) times daily.   glyBURIDE -metformin  5-500 MG tablet Commonly known as: GLUCOVANCE  Take 2 tablets by mouth 2 (two) times daily with a meal.   hydrochlorothiazide 12.5 MG tablet Commonly known as: HYDRODIURIL Take 12.5 mg by mouth daily.   Jardiance 25 MG Tabs tablet Generic drug: empagliflozin Take 25 mg by mouth daily.   labetalol  200 MG tablet Commonly known as: NORMODYNE  Take 200 mg by mouth daily at 6 (six) AM.   loratadine 10 MG tablet Commonly known as: CLARITIN Take  10 mg by mouth daily.   magic mouthwash (nystatin , lidocaine , diphenhydrAMINE , alum & mag hydroxide) suspension Swish and spit 5 mLs 3 (three) times daily as needed for mouth pain.   metFORMIN  500 MG tablet Commonly known as: GLUCOPHAGE  Take 500 mg by mouth 2 (two) times daily.   metoprolol  succinate 50 MG 24 hr tablet Commonly known as: TOPROL -XL Take 50 mg by mouth daily. Take with or immediately following a meal.   nystatin  100000 UNIT/ML suspension Commonly known as: MYCOSTATIN  Use as directed 5 mLs (500,000 Units total) in the mouth or throat 4 (four) times daily. Swish and swallow   OneTouch Delica Plus Lancet33G Misc USE TO TEST TWICE DAILY   OneTouch Verio Flex System w/Device Kit USE TO TEST TWICE DAILY   OneTouch Verio test strip Generic drug: glucose blood USE TO TEST TWICE DAILY   oxyCODONE -acetaminophen  10-325 MG tablet Commonly known as: PERCOCET Take 1 tablet by mouth 2 (two) times daily as needed.   pantoprazole  40 MG tablet Commonly known as: PROTONIX  Take 1 tablet (40 mg total) by mouth 2 (two) times daily.    polyethylene glycol powder 17 GM/SCOOP powder Commonly known as: GlycoLax  Take 17 g by mouth daily.   ramipril  10 MG capsule Commonly known as: ALTACE  Take 10 mg by mouth daily.   simvastatin  20 MG tablet Commonly known as: ZOCOR  Take 20 mg by mouth daily.   sucralfate  1 g tablet Commonly known as: CARAFATE  TAKE 1 TABLET(1 GRAM) BY MOUTH FOUR TIMES DAILY        Allergies:  Allergies  Allergen Reactions   Aspirin  Other (See Comments)    Higher dosage upsets stomach   Bayer Aspirin  [Aspirin ] Other (See Comments)    Stomach flares up    Family History: Family History  Problem Relation Age of Onset   Heart attack Father 75       MI   Hypertension Father    Heart attack Brother 57       MI   Diabetes Mother    Heart attack Paternal Uncle     Social History:  reports that he has never smoked. He has never used smokeless tobacco. He reports that he does not currently use alcohol. He reports current drug use. Drug: Marijuana.  ROS: All other review of systems were reviewed and are negative except what is noted above in HPI  Physical Exam: BP 137/70   Pulse 65   Constitutional:  Alert and oriented, No acute distress. HEENT: [place] AT, moist mucus membranes.  Trachea midline, no masses. Cardiovascular: No clubbing, cyanosis, or edema. Respiratory: Normal respiratory effort, no increased work of breathing. GI: Abdomen is soft, nontender, nondistended, no abdominal masses GU: No CVA tenderness.  Lymph: No cervical or inguinal lymphadenopathy. Skin: No rashes, bruises or suspicious lesions. Neurologic: Grossly intact, no focal deficits, moving all 4 extremities. Psychiatric: Normal mood and affect.  Laboratory Data: Lab Results  Component Value Date   WBC 6.7 11/21/2023   HGB 11.9 (L) 11/21/2023   HCT 35.9 (L) 11/21/2023   MCV 93.2 11/21/2023   PLT 216 11/21/2023    Lab Results  Component Value Date   CREATININE 1.08 11/21/2023    No results found for:  PSA  No results found for: TESTOSTERONE  Lab Results  Component Value Date   HGBA1C 8.5 (H) 08/04/2021    Urinalysis    Component Value Date/Time   COLORURINE STRAW (A) 10/31/2023 2103   APPEARANCEUR CLEAR 10/31/2023 2103   APPEARANCEUR Clear  09/25/2021 1121   LABSPEC 1.023 10/31/2023 2103   PHURINE 6.0 10/31/2023 2103   GLUCOSEU >=500 (A) 10/31/2023 2103   HGBUR SMALL (A) 10/31/2023 2103   BILIRUBINUR NEGATIVE 10/31/2023 2103   BILIRUBINUR Negative 09/25/2021 1121   KETONESUR NEGATIVE 10/31/2023 2103   PROTEINUR NEGATIVE 10/31/2023 2103   UROBILINOGEN 0.2 11/04/2019 1451   UROBILINOGEN 0.2 05/15/2014 0122   NITRITE NEGATIVE 10/31/2023 2103   LEUKOCYTESUR NEGATIVE 10/31/2023 2103    Lab Results  Component Value Date   LABMICR See below: 09/25/2021   WBCUA 0-5 09/25/2021   LABEPIT 0-10 09/25/2021   MUCUS Present 09/25/2021   BACTERIA NONE SEEN 10/31/2023    Pertinent Imaging:  No results found for this or any previous visit.  No results found for this or any previous visit.  No results found for this or any previous visit.  No results found for this or any previous visit.  No results found for this or any previous visit.  No results found for this or any previous visit.  No results found for this or any previous visit.  Results for orders placed during the hospital encounter of 10/31/23  CT Renal Stone Study  Narrative CLINICAL DATA:  Right flank pain.  EXAM: CT ABDOMEN AND PELVIS WITHOUT CONTRAST  TECHNIQUE: Multidetector CT imaging of the abdomen and pelvis was performed following the standard protocol without IV contrast.  RADIATION DOSE REDUCTION: This exam was performed according to the departmental dose-optimization program which includes automated exposure control, adjustment of the mA and/or kV according to patient size and/or use of iterative reconstruction technique.  COMPARISON:  Nov 21, 2021  FINDINGS: Lower chest: No acute  abnormality.  Hepatobiliary: No focal liver abnormality is seen. No gallstones, gallbladder wall thickening, or biliary dilatation.  Pancreas: Unremarkable. No pancreatic ductal dilatation or surrounding inflammatory changes.  Spleen: Chronic capsular calcification is seen involving an otherwise normal-appearing spleen.  Adrenals/Urinary Tract: Adrenal glands are unremarkable. Kidneys are normal in size, without renal calculi, focal lesion, or hydronephrosis. Bladder is unremarkable.  Stomach/Bowel: Stomach is within normal limits. Appendix appears normal. A large stool burden is noted. No evidence of bowel wall thickening, distention, or inflammatory changes.  Vascular/Lymphatic: Aortic atherosclerosis. No enlarged abdominal or pelvic lymph nodes.  Reproductive: There is stable prostate gland enlargement. A penile pump is seen. A collapsed penile pump reservoir is seen within the anterior aspect of the pelvis on the left. An additional, mildly collapsed reservoir is seen within the anterior aspect of the pelvis on the right.  Other: No abdominal wall hernia or abnormality. No abdominopelvic ascites.  Musculoskeletal: Multilevel degenerative changes are seen throughout the lumbar spine with postoperative changes noted at the levels of L2 and L3. No acute osseous abnormality is identified.  IMPRESSION: 1. Large stool burden without evidence of bowel obstruction. 2. 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 3. No evidence of renal calculi. 4. Stable prostatomegaly. 5. Aortic atherosclerosis.   Electronically Signed By: Suzen Dials [name] On: 11/01/2023 02:00   Assessment & Plan:    1. Benign prostatic hyperplasia with urinary obstruction (Primary) -continue uroxatral  10mg  qhs - Urinalysis, Routine w reflex microscopic - BLADDER SCAN AMB NON-IMAGING  2. Frequent urination at night Continue uroxatral  10mg  qhs   No follow-ups on file.  Belvie Clara,  MD  [facility] [place]

## 2024-03-04 NOTE — Progress Notes (Signed)
 Bladder Scan completed today.  Patient can void prior to the bladder scan. Bladder scan result: 82  Performed By: Alfonse Spruce. CMA  Additional notes-

## 2024-03-04 NOTE — Patient Instructions (Signed)

## 2024-03-09 ENCOUNTER — Other Ambulatory Visit (INDEPENDENT_AMBULATORY_CARE_PROVIDER_SITE_OTHER): Payer: Self-pay | Admitting: Gastroenterology

## 2024-03-31 ENCOUNTER — Other Ambulatory Visit (HOSPITAL_COMMUNITY): Payer: Self-pay | Admitting: Gerontology

## 2024-03-31 ENCOUNTER — Ambulatory Visit (HOSPITAL_COMMUNITY)
Admission: RE | Admit: 2024-03-31 | Discharge: 2024-03-31 | Disposition: A | Discharge status: Home / Self Care | Source: Ambulatory Visit | Attending: Gerontology | Admitting: Gerontology

## 2024-03-31 DIAGNOSIS — R059 Cough, unspecified: Secondary | ICD-10-CM | POA: Diagnosis present

## 2024-04-23 ENCOUNTER — Emergency Department (HOSPITAL_COMMUNITY)
Admission: EM | Admit: 2024-04-23 | Discharge: 2024-04-23 | Disposition: A | Discharge status: Home / Self Care | Attending: Emergency Medicine | Admitting: Emergency Medicine

## 2024-04-23 ENCOUNTER — Other Ambulatory Visit: Payer: Self-pay

## 2024-04-23 ENCOUNTER — Encounter (HOSPITAL_COMMUNITY): Payer: Self-pay | Admitting: Emergency Medicine

## 2024-04-23 ENCOUNTER — Ambulatory Visit (INDEPENDENT_AMBULATORY_CARE_PROVIDER_SITE_OTHER): Admitting: Gastroenterology

## 2024-04-23 ENCOUNTER — Encounter (INDEPENDENT_AMBULATORY_CARE_PROVIDER_SITE_OTHER): Payer: Self-pay | Admitting: Gastroenterology

## 2024-04-23 ENCOUNTER — Telehealth (INDEPENDENT_AMBULATORY_CARE_PROVIDER_SITE_OTHER): Payer: Self-pay | Admitting: Gastroenterology

## 2024-04-23 VITALS — BP 149/79 | HR 63 | Temp 97.5°F | Ht 70.0 in | Wt 146.2 lb

## 2024-04-23 DIAGNOSIS — K2970 Gastritis, unspecified, without bleeding: Secondary | ICD-10-CM

## 2024-04-23 DIAGNOSIS — R1033 Periumbilical pain: Secondary | ICD-10-CM

## 2024-04-23 DIAGNOSIS — E11649 Type 2 diabetes mellitus with hypoglycemia without coma: Secondary | ICD-10-CM | POA: Diagnosis not present

## 2024-04-23 DIAGNOSIS — Z860101 Personal history of adenomatous and serrated colon polyps: Secondary | ICD-10-CM | POA: Diagnosis not present

## 2024-04-23 DIAGNOSIS — E162 Hypoglycemia, unspecified: Secondary | ICD-10-CM

## 2024-04-23 DIAGNOSIS — R197 Diarrhea, unspecified: Secondary | ICD-10-CM

## 2024-04-23 DIAGNOSIS — B9681 Helicobacter pylori [H. pylori] as the cause of diseases classified elsewhere: Secondary | ICD-10-CM

## 2024-04-23 DIAGNOSIS — Z7982 Long term (current) use of aspirin: Secondary | ICD-10-CM | POA: Diagnosis not present

## 2024-04-23 DIAGNOSIS — Z7984 Long term (current) use of oral hypoglycemic drugs: Secondary | ICD-10-CM | POA: Diagnosis not present

## 2024-04-23 LAB — URINALYSIS, ROUTINE W REFLEX MICROSCOPIC
Bacteria, UA: NONE SEEN
Bilirubin Urine: NEGATIVE
Glucose, UA: >=500 mg/dL — AB
Hgb urine dipstick: NEGATIVE
Ketones, ur: NEGATIVE mg/dL
Leukocytes,Ua: NEGATIVE
Nitrite: NEGATIVE
Protein, ur: NEGATIVE mg/dL
Specific Gravity, Urine: 1.013 (ref 1.005–1.030)
pH: 5 (ref 5.0–8.0)

## 2024-04-23 LAB — COMPREHENSIVE METABOLIC PANEL WITH GFR
ALT: 11 U/L (ref 0–44)
AST: 17 U/L (ref 15–41)
Albumin: 4.1 g/dL (ref 3.5–5.0)
Alkaline Phosphatase: 49 U/L (ref 38–126)
Anion gap: 10 (ref 5–15)
BUN: 24 mg/dL — ABNORMAL HIGH (ref 8–23)
CO2: 27 mmol/L (ref 22–32)
Calcium: 9.6 mg/dL (ref 8.9–10.3)
Chloride: 98 mmol/L (ref 98–111)
Creatinine, Ser: 1.12 mg/dL (ref 0.61–1.24)
GFR, Estimated: >60 mL/min (ref >60)
Glucose, Bld: 167 mg/dL — ABNORMAL HIGH (ref 70–99)
Potassium: 4 mmol/L (ref 3.5–5.1)
Sodium: 135 mmol/L (ref 135–145)
Total Bilirubin: 0.2 mg/dL (ref 0.0–1.2)
Total Protein: 7.1 g/dL (ref 6.5–8.1)

## 2024-04-23 LAB — CBC WITH DIFFERENTIAL/PLATELET
Abs Immature Granulocytes: 0 K/uL (ref 0.00–0.07)
Basophils Absolute: 0 K/uL (ref 0.0–0.1)
Basophils Relative: 1 %
Eosinophils Absolute: 0.4 K/uL (ref 0.0–0.5)
Eosinophils Relative: 10 %
HCT: 33.4 % — ABNORMAL LOW (ref 39.0–52.0)
Hemoglobin: 11.2 g/dL — ABNORMAL LOW (ref 13.0–17.0)
Immature Granulocytes: 0 %
Lymphocytes Relative: 44 %
Lymphs Abs: 1.8 K/uL (ref 0.7–4.0)
MCH: 31.1 pg (ref 26.0–34.0)
MCHC: 33.5 g/dL (ref 30.0–36.0)
MCV: 92.8 fL (ref 80.0–100.0)
Monocytes Absolute: 0.3 K/uL (ref 0.1–1.0)
Monocytes Relative: 8 %
Neutro Abs: 1.5 K/uL — ABNORMAL LOW (ref 1.7–7.7)
Neutrophils Relative %: 37 %
Platelets: 210 K/uL (ref 150–400)
RBC: 3.6 MIL/uL — ABNORMAL LOW (ref 4.22–5.81)
RDW: 13.4 % (ref 11.5–15.5)
WBC: 4 K/uL (ref 4.0–10.5)
nRBC: 0 % (ref 0.0–0.2)

## 2024-04-23 LAB — CBG MONITORING, ED
Glucose-Capillary: 168 mg/dL — ABNORMAL HIGH (ref 70–99)
Glucose-Capillary: 170 mg/dL — ABNORMAL HIGH (ref 70–99)

## 2024-04-23 NOTE — Telephone Encounter (Signed)
 [name] said to send his prescription to [facility] and not [facility]

## 2024-04-23 NOTE — Progress Notes (Signed)
 Toribio Fortune, [name] Gastroenterology & Hepatology [facility] Gastroenterology [address] [place], [place] [address]  Primary Care Physician: Carlette Benita Area, MD [address] [place] [address]  I will communicate my assessment and recommendations to the referring MD via EMR.  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 male with past medical history of  GERD, diabetes, Schatzki's ring, hypertension, opioid-induced constipation, esophageal dysmotility, chronic H. pylori gastritis, hyperlipidemia and OSA who presents for follow up of abdominal pain and diarrhea.  The patient was last seen on 01/26/2023. At that time, the patient was advised to continue and finish his rifabutin  treatment.  He had H. pylori breath test performed off PPI on 03/02/2024.  Patient reports that since the last week, he has been having issues with abdominal pain in his mid abdomen. States the pain is similar to a sharp pain. States the pain is usually preceding a bowel movement, which has greasy like appearance. Has not seen any melena or hematochezia. Stool can be between soft to formed, but is not watery. Has had between 2-4 bowel movements per day. He has also presented some heartburn and epigastric discomfort.  The patient denies having any nausea, vomiting, fever, chills, hematochezia, melena, hematemesis, abdominal distention, diarrhea, jaundice, pruritus or weight loss.  Last Colonoscopy:11/28/2021 The perianal and digital rectal examinations were normal. Six sessile polyps were found in the ascending colon. The polyps were 2 to 6 mm in size. These polyps were removed with a cold snare. Resection and retrieval were complete for 3 polyps, but the other 3 could not be retrieved (fully resected). The ascending colon  and cecum appeared normal othwerwise. CT scan findings likely related to stool. Non-bleeding internal hemorrhoids were found during retroflexion. The hemorrhoids were small. Pathology consistent with tubular adenomas.   Not Recommended to have a repeat colonoscopy given multiple comorbidities.  Past Medical History: Past Medical History:  Diagnosis Date   Arthritis    fingers, back   Chronic back pain    Diabetes mellitus Since 1995   Takes Glucovance  and Onglyza daily   Diabetes mellitus without complication (HCC)    GERD (gastroesophageal reflux disease)    takes Dexilant and Omeprazole  daily   Herpes    History of colon polyps    History of gastric ulcer 25+yrs ago   Hyperlipidemia Since 1995   takes Simvastatin  daily   Hypertension Since 1995   takes Metoprolol  and Ramipril  daily   Hypertension    Pneumonia    hx of;as a child   Sleep apnea    doesn't use CPAP   Urinary frequency    Urinary urgency    takes Flomax  daily    Past Surgical History: Past Surgical History:  Procedure Laterality Date   BACK SURGERY  1962   Lumbar   BACK SURGERY     bilateral cataract surgery     BIOPSY  02/23/2022   Procedure: BIOPSY;  Surgeon: Fortune Angelia Toribio, MD;  [facility];  Service: Gastroenterology;;   BIOPSY  01/01/2023   Procedure: BIOPSY;  Surgeon: Fortune Angelia Toribio, MD;  [facility];  Service: Gastroenterology;;   CARDIAC CATHETERIZATION  02/16/10   CERVICAL SPINE SURGERY     COLONOSCOPY     COLONOSCOPY N/A 04/23/2013   Procedure: COLONOSCOPY;  Surgeon: Claudis RAYMOND Rivet, MD;  [facility];  Service: Endoscopy;  Laterality: N/A;  1030   COLONOSCOPY N/A 07/24/2018   Procedure: COLONOSCOPY;  Surgeon: Golda Claudis PENNER, MD;  [facility];  Service: Endoscopy;  Laterality: N/A;  8:30   COLONOSCOPY WITH PROPOFOL  N/A 11/28/2021   Procedure: COLONOSCOPY WITH PROPOFOL ;  Surgeon: Eartha Angelia Sieving, MD;  [facility];  Service: Gastroenterology;  Laterality: N/A;  205   ESOPHAGEAL DILATION N/A 02/10/2020   Procedure: ESOPHAGEAL DILATION;  Surgeon: Golda Claudis PENNER, MD;  [facility];  Service: Endoscopy;  Laterality: N/A;   ESOPHAGOGASTRODUODENOSCOPY N/A 12/24/2014   Procedure: ESOPHAGOGASTRODUODENOSCOPY (EGD);  Surgeon: Claudis PENNER Golda, MD;  [facility];  Service: Endoscopy;  Laterality: N/A;  2:45   ESOPHAGOGASTRODUODENOSCOPY N/A 12/31/2023   Procedure: EGD (ESOPHAGOGASTRODUODENOSCOPY);  Surgeon: Eartha Angelia, Sieving, MD;  [facility];  Service: Gastroenterology;  Laterality: N/A;  8:00 am, asa 3   ESOPHAGOGASTRODUODENOSCOPY (EGD) WITH ESOPHAGEAL DILATION     ESOPHAGOGASTRODUODENOSCOPY (EGD) WITH PROPOFOL  N/A 02/10/2020   Procedure: ESOPHAGOGASTRODUODENOSCOPY (EGD) WITH PROPOFOL ;  Surgeon: Golda Claudis PENNER, MD;  [facility];  Service: Endoscopy;  Laterality: N/A;  250   ESOPHAGOGASTRODUODENOSCOPY (EGD) WITH PROPOFOL  N/A 02/23/2022   Procedure: ESOPHAGOGASTRODUODENOSCOPY (EGD) WITH PROPOFOL ;  Surgeon: Eartha Angelia Sieving, MD;  [facility];  Service: Gastroenterology;  Laterality: N/A;  1030   ESOPHAGOGASTRODUODENOSCOPY (EGD) WITH PROPOFOL  N/A 01/01/2023   Procedure: ESOPHAGOGASTRODUODENOSCOPY (EGD) WITH PROPOFOL ;  Surgeon: Eartha Angelia Sieving, MD;  [facility];  Service: Gastroenterology;  Laterality: N/A;  9:00AM;asa 3   HAND SURGERY     Right   LUMBAR LAMINECTOMY/DECOMPRESSION MICRODISCECTOMY N/A 08/29/2012   Procedure: LUMBAR LAMINECTOMY/DECOMPRESSION MICRODISCECTOMY 1 LEVEL;  Surgeon: Catalina CHRISTELLA Stains, MD;  [facility]: [facility];  Service: Neurosurgery;  Laterality: N/A;  Lumbar four-five laminectomies   NECK SURGERY     PENILE PROSTHESIS IMPLANT     PENILE PROSTHESIS IMPLANT N/A 08/07/2021   Procedure: PENILE PROTHESIS INFLATABLE;  Surgeon: Sherrilee Belvie CROME, MD;  Location: [facility];  Service: Urology;   Laterality: N/A;   POLYPECTOMY  11/28/2021   Procedure: POLYPECTOMY;  Surgeon: Eartha Angelia Sieving, MD;  [facility];  Service: Gastroenterology;;   REMOVAL OF PENILE PROSTHESIS N/A 08/07/2021   Procedure: REMOVAL OF PENILE PROSTHESIS;  Surgeon: Sherrilee Belvie CROME, MD;  Location: [facility];  Service: Urology;  Laterality: N/A;   ROTATOR CUFF REPAIR     Right   SHOULDER SURGERY     TRANSURETHRAL RESECTION OF PROSTATE N/A 05/25/2019   Procedure: TRANSURETHRAL RESECTION OF THE PROSTATE (TURP);  Surgeon: Sherrilee Belvie CROME, MD;  Location: [facility];  Service: Urology;  Laterality: N/A;   WRIST SURGERY     Left   WRIST SURGERY      Family History: Family History  Problem Relation Age of Onset   Heart attack Father 99       MI   Hypertension Father    Heart attack Brother 54       MI   Diabetes Mother    Heart attack Paternal Uncle     Social History: Social History   Tobacco Use  Smoking Status Never  Smokeless Tobacco Never   Social History   Substance and Sexual Activity  Alcohol Use Not Currently   Comment: occasionally    Social History   Substance and Sexual Activity  Drug Use Yes   Types: Marijuana   Comment: occ    Allergies: Allergies  Allergen Reactions   Aspirin  Other (See Comments)    Higher dosage upsets stomach   Bayer Aspirin  [Aspirin ] Other (See Comments)    Stomach flares up    Medications: Current Outpatient Medications  Medication Sig Dispense Refill   acyclovir (ZOVIRAX) 200 MG capsule Take 200 mg by mouth as needed.     acyclovir (ZOVIRAX) 800 MG tablet Take 800 mg by mouth 2 (two) times daily.     albuterol  (VENTOLIN  HFA) 108 (90 Base) MCG/ACT inhaler SMARTSIG:2 Puff(s) Via Inhaler 4 Times Daily PRN     alfuzosin  (UROXATRAL ) 10 MG 24 hr tablet Take 1 tablet (10 mg total) by mouth at bedtime. 30 tablet 11   amLODipine  (NORVASC ) 10 MG tablet Take 10 mg by mouth daily.     aspirin  EC 81 MG tablet Take 81 mg by mouth daily.  Swallow whole.     Blood Glucose Monitoring Suppl (ONETOUCH VERIO FLEX SYSTEM) w/Device KIT USE TO TEST TWICE DAILY     buprenorphine (BUTRANS) 15 MCG/HR Place onto the skin once a week.     dicyclomine  (BENTYL ) 10 MG capsule Take 1 capsule (10 mg total) by mouth every 12 (twelve) hours as needed (abdominal pain). 60 capsule 2   famotidine  (PEPCID ) 40 MG tablet Take 40 mg by mouth daily.     gabapentin  (NEURONTIN ) 300 MG capsule Take 300 mg by mouth 2 (two) times daily.     glyBURIDE -metformin  (GLUCOVANCE ) 5-500 MG per tablet Take 2 tablets by mouth 2 (two) times daily with a meal.     hydrochlorothiazide (HYDRODIURIL) 12.5 MG tablet Take 12.5 mg by mouth daily.     JARDIANCE 25 MG TABS tablet Take 25 mg by mouth daily.     labetalol  (NORMODYNE ) 200 MG tablet Take 200 mg by mouth daily at 6 (six) AM.     Lancets (ONETOUCH DELICA PLUS LANCET33G) MISC USE TO TEST TWICE DAILY     metFORMIN  (GLUCOPHAGE ) 500 MG tablet Take 500 mg by mouth 2 (two) times daily.     metoprolol  succinate (TOPROL -XL) 50 MG 24 hr tablet Take 50 mg by mouth daily. Take with or immediately following a meal.     ONETOUCH VERIO test strip USE TO TEST TWICE DAILY     oxyCODONE -acetaminophen  (PERCOCET) 10-325 MG tablet Take 1 tablet by mouth 2 (two) times daily as needed.     ramipril  (ALTACE ) 10 MG capsule Take 10 mg by mouth daily.      simvastatin  (ZOCOR ) 20 MG tablet Take 20 mg by mouth daily.     sucralfate  (CARAFATE ) 1 g tablet TAKE 1 TABLET(1 GRAM) BY MOUTH FOUR TIMES DAILY 120 tablet 0   diclofenac  Sodium (VOLTAREN ) 1 % GEL Apply 4 g topically 4 (four) times daily. (Patient not taking: Reported on 04/23/2024) 150 g 0   magic mouthwash (nystatin , lidocaine , diphenhydrAMINE , alum & mag hydroxide) suspension Swish and spit 5 mLs 3 (three) times daily as needed for mouth pain. (Patient not taking: Reported on 04/23/2024) 210 mL 0   nystatin  (MYCOSTATIN ) 100000 UNIT/ML suspension Use as directed 5 mLs (500,000 Units total) in  the mouth or throat 4 (four) times daily. Swish and swallow (Patient not taking: Reported on 04/23/2024) 240 mL 0   pantoprazole  (PROTONIX ) 40 MG tablet Take 1 tablet (40 mg total) by mouth 2 (two) times daily. 180 tablet 1   polyethylene glycol powder (GLYCOLAX ) 17 GM/SCOOP powder Take 17 g by mouth daily. (Patient not taking: Reported on 04/23/2024) 850 g 3  No current facility-administered medications for this visit.    Review of Systems: GENERAL: negative for malaise, night sweats HEENT: No changes in hearing or vision, no nose bleeds or other nasal problems. NECK: Negative for lumps, goiter, pain and significant neck swelling RESPIRATORY: Negative for cough, wheezing CARDIOVASCULAR: Negative for chest pain, leg swelling, palpitations, orthopnea GI: SEE HPI MUSCULOSKELETAL: Negative for joint pain or swelling, back pain, and muscle pain. SKIN: Negative for lesions, rash PSYCH: Negative for sleep disturbance, mood disorder and recent psychosocial stressors. HEMATOLOGY Negative for prolonged bleeding, bruising easily, and swollen nodes. ENDOCRINE: Negative for cold or heat intolerance, polyuria, polydipsia and goiter. NEURO: negative for tremor, gait imbalance, syncope and seizures. The remainder of the review of systems is noncontributory.   Physical Exam: BP (!) 149/79 (BP Location: Left Arm, Patient Position: Sitting, Cuff Size: Normal)   Pulse 63   Temp (!) 97.5 F (36.4 C) (Temporal)   Ht 5' 10 (1.778 m)   Wt 146 lb 3.2 oz (66.3 kg)   BMI 20.98 kg/m  GENERAL: The patient is AO x3, in no acute distress. HEENT: Head is normocephalic and atraumatic. EOMI are intact. Mouth is well hydrated and without lesions. NECK: Supple. No masses LUNGS: Clear to auscultation. No presence of rhonchi/wheezing/rales. Adequate chest expansion HEART: RRR, normal s1 and s2. ABDOMEN: Soft, nontender, no guarding, no peritoneal signs, and nondistended. BS +. No masses. EXTREMITIES: Without any  cyanosis, clubbing, rash, lesions or edema. NEUROLOGIC: AOx3, no focal motor deficit. SKIN: no jaundice, no rashes  Imaging/Labs: as above  I personally reviewed and interpreted the available labs, imaging and endoscopic files.  Impression and Plan: [name] is a 83 y.o. male with past medical history of  GERD, diabetes, Schatzki's ring, hypertension, opioid-induced constipation, esophageal dysmotility, chronic H. pylori gastritis, hyperlipidemia and OSA who presents for follow up of abdominal pain and diarrhea.  Patient has presented new onset of some periumbilical abdominal pain with increased bowel movements recently.  He did very well after finishing his rifabutin  triple therapy.  His current symptoms are not classical for H. pylori related gastritis, although we will need to reconfirm if he has not presented reinfection with stool based testing.  He is in agreement to proceed with this.  We also rule out other gastrointestinal infectious etiologies with stool based testing.  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  As he is presenting some recurrent heartburn but it is not clear what medications he is taking, I have asked him to bring a list of his current medicines to determine the next best treatment (restarting PPI versus increasing dosage).  - Check C. Diff, GI path and H. Pylori in stool - Please bring a list of the medications you are currently taking  All questions were answered.      Toribio Fortune, MD Gastroenterology and Hepatology [facility] Gastroenterology

## 2024-04-23 NOTE — ED Triage Notes (Signed)
 Pt in by POV. States his home dexcom read 40. Pt drunk orange juice and cool aid then came to ED. Pts cbg 160 on our glucose monitor but pts dexcom still reads low.

## 2024-04-23 NOTE — Patient Instructions (Signed)
-   Check C. Diff, GI path and H. Pylori in stool - Please bring a list of the medications you are currently taking

## 2024-04-23 NOTE — ED Provider Notes (Signed)
 [place] [facility] AT [facility]  Provider Note  CSN: [id number] Arrival date & time: 04/23/24 0459  History Chief Complaint  Patient presents with   Blood Sugar Problem    [name] is a 83 y.o. male with history of DM reports his Dexcom monitor was reading 40 earlier tonight. He wasn't feeling poorly but he ate some crackers and some juice and the monitor was still reading low so he came in for evaluation. He just changed his Dexcom pod earlier tonight. He reports having one episode of diarrhea after eating which has been a recent problem for him. No fevers   Home Medications Prior to Admission medications   Medication Sig Start Date End Date Taking? Authorizing Provider  acyclovir (ZOVIRAX) 200 MG capsule Take 200 mg by mouth as needed. 01/07/14   [provider]  acyclovir (ZOVIRAX) 800 MG tablet Take 800 mg by mouth 2 (two) times daily. 10/03/23   [provider]  albuterol  (VENTOLIN  HFA) 108 (90 Base) MCG/ACT inhaler SMARTSIG:2 Puff(s) Via Inhaler 4 Times Daily PRN 07/29/23   [provider]  alfuzosin  (UROXATRAL ) 10 MG 24 hr tablet Take 1 tablet (10 mg total) by mouth at bedtime. 03/04/24   McKenzie, Belvie LITTIE, MD  amLODipine  (NORVASC ) 10 MG tablet Take 10 mg by mouth daily.    [provider]  aspirin  EC 81 MG tablet Take 81 mg by mouth daily. Swallow whole.    [provider]  Blood Glucose Monitoring Suppl (ONETOUCH VERIO FLEX SYSTEM) w/Device KIT USE TO TEST TWICE DAILY 09/30/19   [provider]  buprenorphine (BUTRANS) 15 MCG/HR Place onto the skin once a week.    [provider]  cetirizine (ZYRTEC) 10 MG tablet Take 10 mg by mouth daily. 01/12/20   [provider]  diclofenac  Sodium (VOLTAREN ) 1 % GEL Apply 4 g topically 4 (four) times daily. 04/11/22   Clark, Meghan R, PA-C  dicyclomine  (BENTYL ) 10 MG capsule Take 1 capsule (10 mg total) by mouth every 12 (twelve) hours as  needed (abdominal pain). 01/15/22   Castaneda Mayorga, Daniel, MD  famotidine  (PEPCID ) 40 MG tablet Take 40 mg by mouth daily.    [provider]  gabapentin  (NEURONTIN ) 300 MG capsule Take 300 mg by mouth 2 (two) times daily. 12/29/21   [provider]  glyBURIDE -metformin  (GLUCOVANCE ) 5-500 MG per tablet Take 2 tablets by mouth 2 (two) times daily with a meal.    [provider]  hydrochlorothiazide (HYDRODIURIL) 12.5 MG tablet Take 12.5 mg by mouth daily. 07/20/21   [provider]  JARDIANCE 25 MG TABS tablet Take 25 mg by mouth daily. 08/11/21   [provider]  labetalol  (NORMODYNE ) 200 MG tablet Take 200 mg by mouth daily at 6 (six) AM.    [provider]  Lancets (ONETOUCH DELICA PLUS LANCET33G) MISC USE TO TEST TWICE DAILY 09/30/19   [provider]  loratadine (CLARITIN) 10 MG tablet Take 10 mg by mouth daily.    [provider]  magic mouthwash (nystatin , lidocaine , diphenhydrAMINE , alum & mag hydroxide) suspension Swish and spit 5 mLs 3 (three) times daily as needed for mouth pain. 05/14/23   Carlan, Chelsea L, NP  metFORMIN  (GLUCOPHAGE ) 500 MG tablet Take 500 mg by mouth 2 (two) times daily. 11/15/23   [provider]  metoprolol  succinate (TOPROL -XL) 50 MG 24 hr tablet Take 50 mg by mouth daily. Take with or immediately following a meal.  [provider]  nystatin  (MYCOSTATIN ) 100000 UNIT/ML suspension Use as directed 5 mLs (500,000 Units total) in the mouth or throat 4 (four) times daily. Swish and swallow 01/01/23   Eartha Flavors, Toribio, MD  [facility] VERIO test strip USE TO TEST TWICE DAILY 09/30/19   [provider]  oxyCODONE -acetaminophen  (PERCOCET) 10-325 MG tablet Take 1 tablet by mouth 2 (two) times daily as needed.    [provider]  pantoprazole  (PROTONIX ) 40 MG tablet Take 1 tablet (40 mg total) by mouth 2 (two) times daily. 12/12/23   Carlan, Chelsea L, NP  polyethylene  glycol powder (GLYCOLAX ) 17 GM/SCOOP powder Take 17 g by mouth daily. 04/17/21   Eartha Flavors Toribio, MD  ramipril  (ALTACE ) 10 MG capsule Take 10 mg by mouth daily.  02/05/14   [provider]  simvastatin  (ZOCOR ) 20 MG tablet Take 20 mg by mouth daily. 04/15/13   [provider]  sucralfate  (CARAFATE ) 1 g tablet TAKE 1 TABLET(1 GRAM) BY MOUTH FOUR TIMES DAILY 03/10/24   Carlan, Chelsea L, NP  bismuth -metronidazole -tetracycline  (PLYERA) 140-125-125 MG per capsule Take 3 capsules by mouth 4 (four) times daily -  before meals and at bedtime.  09/26/11  [provider]     Allergies    Aspirin  and Bayer aspirin  [aspirin ]   Review of Systems   Review of Systems Please see HPI for pertinent positives and negatives  Physical Exam BP (!) 144/69   Pulse 61   Temp 97.6 F (36.4 C) (Oral)   Resp 16   Ht 5' 10 (1.778 m)   Wt 65.8 kg   SpO2 100%   BMI 20.81 kg/m   Physical Exam Vitals and nursing note reviewed.  Constitutional:      Appearance: Normal appearance.  HENT:     Head: Normocephalic and atraumatic.     Nose: Nose normal.     Mouth/Throat:     Mouth: Mucous membranes are moist.  Eyes:     Extraocular Movements: Extraocular movements intact.     Conjunctiva/sclera: Conjunctivae normal.  Cardiovascular:     Rate and Rhythm: Normal rate.  Pulmonary:     Effort: Pulmonary effort is normal.     Breath sounds: Normal breath sounds.  Abdominal:     General: Abdomen is flat.     Palpations: Abdomen is soft.     Tenderness: There is no abdominal tenderness.  Musculoskeletal:        General: No swelling. Normal range of motion.     Cervical back: Neck supple.  Skin:    General: Skin is warm and dry.  Neurological:     General: No focal deficit present.     Mental Status: He is alert.  Psychiatric:        Mood and Affect: Mood normal.     ED Results / Procedures / Treatments   EKG None  Procedures Procedures  Medications Ordered in  the ED Medications - No data to display  Initial Impression and Plan  Patient with suspected low glucose at home, found to be slightly elevated on arrival although his Dexcom is still reading low. Suspect he has a defective Dexcom, but will check labs and monitor in the ED for recurrent hypoglycemia. Advised to change his Dexcom as soon as he is back home   ED Course   Clinical Course as of 04/23/24 0654  Thu Apr 23, 2024  0551 CBC is unremarkable.  [CS]  9378 CMP is unremarkable.  [CS]  [id number]  UA with glucosuria, but otherwise normal. Will recheck CBG, anticipate discharge if not low.  [CS]    Clinical Course User Index [CS] Roselyn Carlin NOVAK, MD     MDM Rules/Calculators/A&P Medical Decision Making Problems Addressed: Hypoglycemia: acute illness or injury  Amount and/or Complexity of Data Reviewed Labs: ordered. Decision-making details documented in ED Course.     Final Clinical Impression(s) / ED Diagnoses Final diagnoses:  Hypoglycemia    Rx / DC Orders ED Discharge Orders     None        Roselyn Carlin NOVAK, MD 04/23/24 [id number]

## 2024-04-24 ENCOUNTER — Other Ambulatory Visit (INDEPENDENT_AMBULATORY_CARE_PROVIDER_SITE_OTHER): Payer: Self-pay | Admitting: Gastroenterology

## 2024-04-24 ENCOUNTER — Telehealth (INDEPENDENT_AMBULATORY_CARE_PROVIDER_SITE_OTHER): Payer: Self-pay

## 2024-04-24 ENCOUNTER — Encounter (INDEPENDENT_AMBULATORY_CARE_PROVIDER_SITE_OTHER): Payer: Self-pay

## 2024-04-24 NOTE — Telephone Encounter (Signed)
 Patient is taking pantoprazole  twice daily and famotidine .  No more acid suppressive medication will be sent to his pharmacy for now until we receive the results of the stool testing. Thanks

## 2024-04-24 NOTE — Progress Notes (Signed)
  error

## 2024-04-24 NOTE — Telephone Encounter (Signed)
 Patient brought by his medications. I have went over all of them. Patient says he takes only what he brought by, and I have documented and deleted what was not in his bag. Patient says for you to please send in his medication you were going to send yesterday to [facility]. Thanks.

## 2024-04-25 LAB — GI PROFILE, STOOL, PCR

## 2024-04-25 LAB — H. PYLORI ANTIGEN, STOOL: H pylori Ag, Stl: NEGATIVE

## 2024-04-27 LAB — C DIFFICILE TOXINS A+B W/RFLX: C difficile Toxins A+B, EIA: NEGATIVE

## 2024-04-27 LAB — C DIFFICILE, CYTOTOXIN B

## 2024-04-27 NOTE — Telephone Encounter (Signed)
 Stool testing is now back.

## 2024-04-28 NOTE — Telephone Encounter (Signed)
 He would like the medication sent to [facility]. Thanks,

## 2024-04-28 NOTE — Telephone Encounter (Signed)
 Patient has walked into the clinic stating he would like his medication sent into the pharmacy.   The stool testing is back.   Please advise.

## 2024-04-29 ENCOUNTER — Ambulatory Visit (INDEPENDENT_AMBULATORY_CARE_PROVIDER_SITE_OTHER): Payer: Self-pay | Admitting: Gastroenterology

## 2024-04-29 ENCOUNTER — Telehealth (INDEPENDENT_AMBULATORY_CARE_PROVIDER_SITE_OTHER): Payer: Self-pay

## 2024-04-29 MED ORDER — DICYCLOMINE HCL 10 MG PO CAPS: 10.0000 mg | ORAL_CAPSULE | Freq: Two times a day (BID) | ORAL | 0 refills | Status: DC | PRN

## 2024-04-29 NOTE — Telephone Encounter (Signed)
 Dicyclomine  approved per [employer] through 07/08/2024.

## 2024-04-29 NOTE — Telephone Encounter (Signed)
 Noted. Thanks.

## 2024-04-29 NOTE — Telephone Encounter (Signed)
 Spoke to the patient today regarding negative stool testing results.  He reports that his abdominal pain has improved and he is not having any more diarrhea.  He likely had a transient gastrointestinal infection.  I sent some dicyclomine  to his pharmacy.

## 2024-05-13 ENCOUNTER — Encounter (INDEPENDENT_AMBULATORY_CARE_PROVIDER_SITE_OTHER): Payer: Self-pay | Admitting: Gastroenterology

## 2024-06-03 ENCOUNTER — Ambulatory Visit (HOSPITAL_COMMUNITY)
Admission: RE | Admit: 2024-06-03 | Discharge: 2024-06-03 | Disposition: A | Discharge status: Home / Self Care | Source: Ambulatory Visit | Attending: Gerontology | Admitting: Gerontology

## 2024-06-03 ENCOUNTER — Other Ambulatory Visit (HOSPITAL_COMMUNITY): Payer: Self-pay | Admitting: Gerontology

## 2024-06-03 DIAGNOSIS — R059 Cough, unspecified: Secondary | ICD-10-CM | POA: Diagnosis present

## 2024-06-11 ENCOUNTER — Ambulatory Visit (INDEPENDENT_AMBULATORY_CARE_PROVIDER_SITE_OTHER): Admitting: Gastroenterology

## 2024-06-11 ENCOUNTER — Encounter (INDEPENDENT_AMBULATORY_CARE_PROVIDER_SITE_OTHER): Payer: Self-pay | Admitting: Gastroenterology

## 2024-06-11 VITALS — BP 97/62 | HR 67 | Temp 97.2°F | Ht 70.0 in | Wt 144.5 lb

## 2024-06-11 DIAGNOSIS — K219 Gastro-esophageal reflux disease without esophagitis: Secondary | ICD-10-CM

## 2024-06-11 DIAGNOSIS — R109 Unspecified abdominal pain: Secondary | ICD-10-CM

## 2024-06-11 DIAGNOSIS — B9681 Helicobacter pylori [H. pylori] as the cause of diseases classified elsewhere: Secondary | ICD-10-CM

## 2024-06-11 DIAGNOSIS — R197 Diarrhea, unspecified: Secondary | ICD-10-CM

## 2024-06-11 MED ORDER — DICYCLOMINE HCL 10 MG PO CAPS: 10.0000 mg | ORAL_CAPSULE | Freq: Two times a day (BID) | ORAL | 1 refills | Status: AC | PRN

## 2024-06-11 NOTE — Progress Notes (Addendum)
 Referring Provider: Fanta, Tesfaye Demissie* Primary Care Physician:  Fanta, Tesfaye Demissie, MD Primary GI Physician: Dr. Eartha   Chief Complaint  Patient presents with   Follow-up    Pt arrives for follow up. At Thanksgiving, pt ate 4 slices of turkey and the next day he vomited everything up. Pt states he was locked up and was unable to get medication. Pt states he is not sure what to eat that wont run his blood sugar up   HPI:   [name] is a 83 y.o. male with past medical history of GERD, diabetes, Schatzki's ring, hypertension, opioid-induced constipation, esophageal dysmotility, chronic H. pylori gastritis, hyperlipidemia and OSA   Patient presenting today for:  Abdominal pain, GERD and diarrhea   Last seen by Dr. Eartha in October, at that time had recent H pylori testing in August that was negative, he reported abdominal pain and diarrhea with greasy like stools, no watery stools. 2-4 Bms per day.   Recommended C diff, GI path panel, H pylori stool testing  Labs on 10/16: Negative H pylori stool test Negative C diff  Negative GI path CMP with BUn 24  CBC with hgb 11.2  Patient sent dicyclomine  for suspect transient gastroenteritis on 04/29/24  Present:  Having some occasional periumbilical pain, reports this is not frequent. He reports he had some vomiting after eating 4 pieces of turkey the day after thanksgiving but no vomiting since then. He reports usually going to the restroom about 2 times per day, stools are more solid. Doing miralax  2t each morning. No rectal bleeding or melena. Appetite is good but states he is having issues figuring out what to eat that does not run his blood sugar up. He loves potatoes but these run his sugar up. Had an episode of reflux once last month but none since, he is taking protonix  40mg  twice daily and famotidine  40mg  at bedtime. Does not think he is taking carafate  at this time. Water  intake is good, he thinks about 1  gallon per day. He takes percocet atleast once per day. States that he had bentyl  but states he got locked up and the nurse at the jail took his bentyl  and did not put it back.    CT Renal stone study : 10/2023 Large stool burden without evidence of bowel obstruction. 2. 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 3. No evidence of renal calculi. 4. Stable prostatomegaly. 5. Aortic atherosclerosis.   Last EGD; 12/2023 - Normal esophagus.                           - Z-line irregular, 40 cm from the incisors.                           - Gastritis. Biopsied.                           - Normal examined duodenum. Last Colonoscopy:11/28/2021 The perianal and digital rectal examinations were normal. Six sessile polyps were found in the ascending colon. The polyps were 2 to 6 mm in size. These polyps were removed with a cold snare. Resection and retrieval were complete for 3 polyps, but the other 3 could not be retrieved (fully resected). The ascending colon and cecum appeared normal othwerwise. CT scan findings likely related to stool. Non-bleeding internal hemorrhoids were found during retroflexion. The hemorrhoids were small.  Pathology consistent with tubular adenomas.   Not Recommended to have a repeat colonoscopy given multiple comorbidities.  Past Medical History:  Diagnosis Date   Arthritis    fingers, back   Chronic back pain    Diabetes mellitus Since 1995   Takes Glucovance  and Onglyza daily   Diabetes mellitus without complication (HCC)    GERD (gastroesophageal reflux disease)    takes Dexilant and Omeprazole  daily   Herpes    History of colon polyps    History of gastric ulcer 25+yrs ago   Hyperlipidemia Since 1995   takes Simvastatin  daily   Hypertension Since 1995   takes Metoprolol  and Ramipril  daily   Hypertension    Pneumonia    hx of;as a child   Sleep apnea    doesn't use CPAP   Urinary frequency    Urinary urgency    takes Flomax  daily    Past Surgical  History:  Procedure Laterality Date   BACK SURGERY  1962   Lumbar   BACK SURGERY     bilateral cataract surgery     BIOPSY  02/23/2022   Procedure: BIOPSY;  Surgeon: Eartha Angelia Sieving, MD;  [facility];  Service: Gastroenterology;;   BIOPSY  01/01/2023   Procedure: BIOPSY;  Surgeon: Eartha Angelia Sieving, MD;  [facility];  Service: Gastroenterology;;   CARDIAC CATHETERIZATION  02/16/10   CERVICAL SPINE SURGERY     COLONOSCOPY     COLONOSCOPY N/A 04/23/2013   Procedure: COLONOSCOPY;  Surgeon: Claudis RAYMOND Rivet, MD;  [facility]: [facility];  Service: Endoscopy;  Laterality: N/A;  1030   COLONOSCOPY N/A 07/24/2018   Procedure: COLONOSCOPY;  Surgeon: Rivet Claudis RAYMOND, MD;  [facility];  Service: Endoscopy;  Laterality: N/A;  8:30   COLONOSCOPY WITH PROPOFOL  N/A 11/28/2021   Procedure: COLONOSCOPY WITH PROPOFOL ;  Surgeon: Eartha Angelia Sieving, MD;  [facility];  Service: Gastroenterology;  Laterality: N/A;  205   ESOPHAGEAL DILATION N/A 02/10/2020   Procedure: ESOPHAGEAL DILATION;  Surgeon: Rivet Claudis RAYMOND, MD;  [facility];  Service: Endoscopy;  Laterality: N/A;   ESOPHAGOGASTRODUODENOSCOPY N/A 12/24/2014   Procedure: ESOPHAGOGASTRODUODENOSCOPY (EGD);  Surgeon: Claudis RAYMOND Rivet, MD;  [facility];  Service: Endoscopy;  Laterality: N/A;  2:45   ESOPHAGOGASTRODUODENOSCOPY N/A 12/31/2023   Procedure: EGD (ESOPHAGOGASTRODUODENOSCOPY);  Surgeon: Eartha Angelia, Sieving, MD;  [facility];  Service: Gastroenterology;  Laterality: N/A;  8:00 am, asa 3   ESOPHAGOGASTRODUODENOSCOPY (EGD) WITH ESOPHAGEAL DILATION     ESOPHAGOGASTRODUODENOSCOPY (EGD) WITH PROPOFOL  N/A 02/10/2020   Procedure: ESOPHAGOGASTRODUODENOSCOPY (EGD) WITH PROPOFOL ;  Surgeon: Rivet Claudis RAYMOND, MD;  [facility];  Service: Endoscopy;  Laterality: N/A;  250   ESOPHAGOGASTRODUODENOSCOPY (EGD) WITH PROPOFOL  N/A 02/23/2022   Procedure:  ESOPHAGOGASTRODUODENOSCOPY (EGD) WITH PROPOFOL ;  Surgeon: Eartha Angelia Sieving, MD;  [facility];  Service: Gastroenterology;  Laterality: N/A;  1030   ESOPHAGOGASTRODUODENOSCOPY (EGD) WITH PROPOFOL  N/A 01/01/2023   Procedure: ESOPHAGOGASTRODUODENOSCOPY (EGD) WITH PROPOFOL ;  Surgeon: Eartha Angelia Sieving, MD;  [facility];  Service: Gastroenterology;  Laterality: N/A;  9:00AM;asa 3   HAND SURGERY     Right   LUMBAR LAMINECTOMY/DECOMPRESSION MICRODISCECTOMY N/A 08/29/2012   Procedure: LUMBAR LAMINECTOMY/DECOMPRESSION MICRODISCECTOMY 1 LEVEL;  Surgeon: Catalina CHRISTELLA Stains, MD;  Location: [facility];  Service: Neurosurgery;  Laterality: N/A;  Lumbar four-five laminectomies   NECK SURGERY     PENILE PROSTHESIS IMPLANT  PENILE PROSTHESIS IMPLANT N/A 08/07/2021   Procedure: PENILE PROTHESIS INFLATABLE;  Surgeon: Sherrilee Belvie CROME, MD;  Location: [facility];  Service: Urology;  Laterality: N/A;   POLYPECTOMY  11/28/2021   Procedure: POLYPECTOMY;  Surgeon: Eartha Angelia Sieving, MD;  [facility];  Service: Gastroenterology;;   REMOVAL OF PENILE PROSTHESIS N/A 08/07/2021   Procedure: REMOVAL OF PENILE PROSTHESIS;  Surgeon: Sherrilee Belvie CROME, MD;  Location: [facility];  Service: Urology;  Laterality: N/A;   ROTATOR CUFF REPAIR     Right   SHOULDER SURGERY     TRANSURETHRAL RESECTION OF PROSTATE N/A 05/25/2019   Procedure: TRANSURETHRAL RESECTION OF THE PROSTATE (TURP);  Surgeon: Sherrilee Belvie CROME, MD;  Location: [facility];  Service: Urology;  Laterality: N/A;   WRIST SURGERY     Left   WRIST SURGERY      Current Outpatient Medications  Medication Sig Dispense Refill   acyclovir (ZOVIRAX) 200 MG capsule Take 200 mg by mouth as needed.     acyclovir (ZOVIRAX) 800 MG tablet Take 800 mg by mouth 2 (two) times daily.     albuterol  (VENTOLIN  HFA) 108 (90 Base) MCG/ACT inhaler SMARTSIG:2 Puff(s) Via Inhaler 4 Times Daily PRN     alfuzosin  (UROXATRAL ) 10 MG 24 hr  tablet Take 1 tablet (10 mg total) by mouth at bedtime. 30 tablet 11   aspirin  EC 81 MG tablet Take 81 mg by mouth daily. Swallow whole.     buprenorphine (BUTRANS) 15 MCG/HR Place onto the skin once a week.     Continuous Glucose Receiver (DEXCOM G6 RECEIVER) DEVI by Does not apply route.     Continuous Glucose Sensor (DEXCOM G6 SENSOR) MISC by Does not apply route.     dicyclomine  (BENTYL ) 10 MG capsule Take 1 capsule (10 mg total) by mouth every 12 (twelve) hours as needed (abdominal pain). 30 capsule 0   famotidine  (PEPCID ) 40 MG tablet Take 40 mg by mouth at bedtime.     gabapentin  (NEURONTIN ) 300 MG capsule Take 300 mg by mouth 2 (two) times daily.     hydrochlorothiazide (HYDRODIURIL) 12.5 MG tablet Take 12.5 mg by mouth daily.     JARDIANCE 25 MG TABS tablet Take 25 mg by mouth daily.     metFORMIN  (GLUCOPHAGE ) 850 MG tablet Take 850 mg by mouth 2 (two) times daily with a meal.     oxyCODONE -acetaminophen  (PERCOCET) 10-325 MG tablet Take 1 tablet by mouth 2 (two) times daily as needed.     pantoprazole  (PROTONIX ) 40 MG tablet Take 1 tablet (40 mg total) by mouth 2 (two) times daily. 180 tablet 1   ramipril  (ALTACE ) 10 MG capsule Take 10 mg by mouth at bedtime.     simvastatin  (ZOCOR ) 20 MG tablet Take 20 mg by mouth daily.     sucralfate  (CARAFATE ) 1 g tablet TAKE 1 TABLET(1 GRAM) BY MOUTH FOUR TIMES DAILY 120 tablet 0   No current facility-administered medications for this visit.    Allergies as of 06/11/2024 - Review Complete 06/11/2024  Allergen Reaction Noted   Aspirin  Other (See Comments)    Bayer aspirin  [aspirin ] Other (See Comments) 06/20/2013    Social History   Socioeconomic History   Marital status: Widowed    Spouse name: Not on file   Number of children: 8   Years of education: Not on file   Highest education level: Not on file  Occupational History   Occupation: Human resources officer company    Employer: RETIRED  Comment: Retired  Tobacco Use   Smoking status:  Never   Smokeless tobacco: Never  Vaping Use   Vaping status: Never Used  Substance and Sexual Activity   Alcohol use: Not Currently    Comment: occasionally    Drug use: Yes    Types: Marijuana    Comment: occ   Sexual activity: Never  Other Topics Concern   Not on file  Social History Narrative   ** Merged History Encounter **       Social Drivers of Corporate Investment Banker Strain: Not on file  Food Insecurity: No Food Insecurity (06/12/2022)   Received [facility]   Hunger Vital Sign    Within the past 12 months, you worried that your food would run out before you got the money to buy more.: Never true    Within the past 12 months, the food you bought just didn't last and you didn't have money to get more.: Never true  Transportation Needs: Not on file  Physical Activity: Not on file  Stress: Not on file  Social Connections: Not on file    Review of systems General: negative for malaise, night sweats, fever, chills, weight loss Neck: Negative for lumps, goiter, pain and significant neck swelling Resp: Negative for cough, wheezing, dyspnea at rest CV: Negative for chest pain, leg swelling, palpitations, orthopnea GI: denies melena, hematochezia, nausea, vomiting, diarrhea, constipation, dysphagia, odyonophagia, early satiety or unintentional weight loss. +occasional periumbilical pain  MSK: Negative for joint pain or swelling, back pain, and muscle pain. Derm: Negative for itching or rash Psych: Denies depression, anxiety, memory loss, confusion. No homicidal or suicidal ideation.  Heme: Negative for prolonged bleeding, bruising easily, and swollen nodes. Endocrine: Negative for cold or heat intolerance, polyuria, polydipsia and goiter. Neuro: negative for tremor, gait imbalance, syncope and seizures. The remainder of the review of systems is noncontributory.  Physical Exam: There were no vitals taken for this visit. General:   Alert and oriented. No  distress noted. Pleasant and cooperative.  Head:  Normocephalic and atraumatic. Eyes:  Conjuctiva clear without scleral icterus. Mouth:  Oral mucosa pink and moist. Good dentition. No lesions. Heart: Normal rate and rhythm, s1 and s2 heart sounds present.  Lungs: Clear lung sounds in all lobes. Respirations equal and unlabored. Abdomen:  +BS, soft, non-tender and non-distended. No rebound or guarding. No HSM or masses noted. Derm: No palmar erythema or jaundice Msk:  Symmetrical without gross deformities. Normal posture. Extremities:  Without edema. Neurologic:  Alert and  oriented x4 Psych:  Alert and cooperative. Normal mood and affect.  Invalid input(s): 6 MONTHS   ASSESSMENT: [name] is a 83 y.o. male presenting today for follow up of abdominal pain and diarrhea   Some ongoing intermittent abdominal pain though less frequently than before, reports bentyl  previously helped this but he no longer has this medication after [facility] where it was confiscated by the nurse there. GERD mostly well controlled on PPI BID and H2B. He had some vomiting after thanksgiving that was self limiting, likely gastroenteritis. For now, will continue with PPI BID, H2B and bentyl  PRN for abdominal pain  Previous diarrhea has resolved, as above, stool studies negative. Could have been overflow diarrhea as he has a history of constipation with stool burden noted on imaging earlier this year. He is taking miralax  with good results. Will continue with good water  intake, miralax  as he is doing.  He states he is having  trouble knowing what to eat to keep his blood sugar from going up, we discussed avoiding processed carbs, starches and sugary foods. Focusing on fruits, veggies, whole grains and lean protein, I provided a handout for him to help with this as well.    PLAN:  -continue protonix  40mg  BID -continue pepcid  40mg  at bedtime -bentyl  10mg  every 12 hours as needed for abdominal  pain -continue good water  intake -continue miralax   -diabetes eating guide handout provided   All questions were answered, patient verbalized understanding and is in agreement with plan as outlined above.    Follow Up: 6 months   Skii Cleland L. Mariette, MSN, APRN, AGNP-C Adult-Gerontology Nurse Practitioner [facility]  I have reviewed the note and agree with the APP's assessment as described in this progress note  Toribio Fortune, MD Gastroenterology and Hepatology [facility] Gastroenterology

## 2024-06-11 NOTE — Patient Instructions (Signed)
-  continue protonix  40mg  twice a day -continue pepcid  40mg  at bedtime -bentyl  10mg  every 12 hours as needed for abdominal pain -continue good water  intake -continue miralax  for constipation  - I have provided a handout on foods to eat that should be  Follow up 6 months

## 2025-03-05 ENCOUNTER — Ambulatory Visit: Admitting: Urology
